# Patient Record
Sex: Male | Born: 1935 | Race: White | Hispanic: No | Smoking: Never smoker
Health system: Southern US, Community
[De-identification: ages and names within clinical notes are randomized; demographics above are authoritative.]

## PROBLEM LIST (undated history)

## (undated) DIAGNOSIS — Z8619 Personal history of other infectious and parasitic diseases: Secondary | ICD-10-CM

## (undated) DIAGNOSIS — G8929 Other chronic pain: Secondary | ICD-10-CM

## (undated) DIAGNOSIS — M549 Dorsalgia, unspecified: Secondary | ICD-10-CM

## (undated) DIAGNOSIS — R4189 Other symptoms and signs involving cognitive functions and awareness: Secondary | ICD-10-CM

## (undated) DIAGNOSIS — I779 Disorder of arteries and arterioles, unspecified: Secondary | ICD-10-CM

## (undated) DIAGNOSIS — K219 Gastro-esophageal reflux disease without esophagitis: Secondary | ICD-10-CM

## (undated) DIAGNOSIS — J302 Other seasonal allergic rhinitis: Secondary | ICD-10-CM

## (undated) DIAGNOSIS — I739 Peripheral vascular disease, unspecified: Secondary | ICD-10-CM

## (undated) DIAGNOSIS — I2510 Atherosclerotic heart disease of native coronary artery without angina pectoris: Secondary | ICD-10-CM

## (undated) DIAGNOSIS — M1990 Unspecified osteoarthritis, unspecified site: Secondary | ICD-10-CM

## (undated) DIAGNOSIS — K635 Polyp of colon: Secondary | ICD-10-CM

## (undated) DIAGNOSIS — M109 Gout, unspecified: Secondary | ICD-10-CM

## (undated) DIAGNOSIS — I482 Chronic atrial fibrillation, unspecified: Secondary | ICD-10-CM

## (undated) DIAGNOSIS — A630 Anogenital (venereal) warts: Secondary | ICD-10-CM

## (undated) DIAGNOSIS — I10 Essential (primary) hypertension: Secondary | ICD-10-CM

## (undated) DIAGNOSIS — E785 Hyperlipidemia, unspecified: Secondary | ICD-10-CM

## (undated) DIAGNOSIS — D691 Qualitative platelet defects: Secondary | ICD-10-CM

## (undated) DIAGNOSIS — Z9289 Personal history of other medical treatment: Secondary | ICD-10-CM

## (undated) DIAGNOSIS — G4733 Obstructive sleep apnea (adult) (pediatric): Secondary | ICD-10-CM

## (undated) DIAGNOSIS — I5189 Other ill-defined heart diseases: Secondary | ICD-10-CM

## (undated) HISTORY — DX: Chronic atrial fibrillation, unspecified: I48.20

## (undated) HISTORY — PX: ROTATOR CUFF REPAIR: SHX139

## (undated) HISTORY — PX: GALLBLADDER SURGERY: SHX652

## (undated) HISTORY — DX: Dorsalgia, unspecified: M54.9

## (undated) HISTORY — DX: Hyperlipidemia, unspecified: E78.5

## (undated) HISTORY — DX: Essential (primary) hypertension: I10

## (undated) HISTORY — DX: Gout, unspecified: M10.9

## (undated) HISTORY — PX: KNEE SURGERY: SHX244

## (undated) HISTORY — DX: Unspecified osteoarthritis, unspecified site: M19.90

## (undated) HISTORY — DX: Personal history of other infectious and parasitic diseases: Z86.19

## (undated) HISTORY — DX: Anogenital (venereal) warts: A63.0

## (undated) HISTORY — DX: Other chronic pain: G89.29

## (undated) HISTORY — DX: Obstructive sleep apnea (adult) (pediatric): G47.33

## (undated) HISTORY — DX: Other seasonal allergic rhinitis: J30.2

## (undated) HISTORY — DX: Gastro-esophageal reflux disease without esophagitis: K21.9

## (undated) HISTORY — DX: Disorder of arteries and arterioles, unspecified: I77.9

## (undated) HISTORY — DX: Peripheral vascular disease, unspecified: I73.9

## (undated) HISTORY — DX: Qualitative platelet defects: D69.1

## (undated) HISTORY — DX: Other symptoms and signs involving cognitive functions and awareness: R41.89

## (undated) HISTORY — DX: Polyp of colon: K63.5

## (undated) HISTORY — DX: Atherosclerotic heart disease of native coronary artery without angina pectoris: I25.10

## (undated) HISTORY — DX: Personal history of other medical treatment: Z92.89

## (undated) HISTORY — DX: Other ill-defined heart diseases: I51.89

## (undated) HISTORY — PX: GYNECOMASTIA EXCISION: SHX5170

## (undated) HISTORY — PX: CARDIAC CATHETERIZATION: SHX172

---
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
## 1993-08-25 HISTORY — PX: CORONARY ARTERY BYPASS GRAFT: SHX141

## 1998-08-25 DIAGNOSIS — I482 Chronic atrial fibrillation, unspecified: Secondary | ICD-10-CM

## 1998-08-25 HISTORY — DX: Chronic atrial fibrillation, unspecified: I48.20

## 2008-01-24 LAB — HM COLONOSCOPY

## 2011-08-26 LAB — HM COLONOSCOPY

## 2011-08-27 DIAGNOSIS — IMO0002 Reserved for concepts with insufficient information to code with codable children: Secondary | ICD-10-CM | POA: Diagnosis not present

## 2011-08-27 DIAGNOSIS — M999 Biomechanical lesion, unspecified: Secondary | ICD-10-CM | POA: Diagnosis not present

## 2011-08-27 DIAGNOSIS — M546 Pain in thoracic spine: Secondary | ICD-10-CM | POA: Diagnosis not present

## 2011-08-28 DIAGNOSIS — IMO0002 Reserved for concepts with insufficient information to code with codable children: Secondary | ICD-10-CM | POA: Diagnosis not present

## 2011-08-28 DIAGNOSIS — M546 Pain in thoracic spine: Secondary | ICD-10-CM | POA: Diagnosis not present

## 2011-08-28 DIAGNOSIS — M999 Biomechanical lesion, unspecified: Secondary | ICD-10-CM | POA: Diagnosis not present

## 2011-09-01 DIAGNOSIS — IMO0002 Reserved for concepts with insufficient information to code with codable children: Secondary | ICD-10-CM | POA: Diagnosis not present

## 2011-09-01 DIAGNOSIS — M546 Pain in thoracic spine: Secondary | ICD-10-CM | POA: Diagnosis not present

## 2011-09-01 DIAGNOSIS — M999 Biomechanical lesion, unspecified: Secondary | ICD-10-CM | POA: Diagnosis not present

## 2011-09-02 DIAGNOSIS — IMO0002 Reserved for concepts with insufficient information to code with codable children: Secondary | ICD-10-CM | POA: Diagnosis not present

## 2011-09-02 DIAGNOSIS — G4733 Obstructive sleep apnea (adult) (pediatric): Secondary | ICD-10-CM | POA: Diagnosis not present

## 2011-09-02 DIAGNOSIS — M546 Pain in thoracic spine: Secondary | ICD-10-CM | POA: Diagnosis not present

## 2011-09-02 DIAGNOSIS — M999 Biomechanical lesion, unspecified: Secondary | ICD-10-CM | POA: Diagnosis not present

## 2011-09-03 DIAGNOSIS — IMO0002 Reserved for concepts with insufficient information to code with codable children: Secondary | ICD-10-CM | POA: Diagnosis not present

## 2011-09-03 DIAGNOSIS — M999 Biomechanical lesion, unspecified: Secondary | ICD-10-CM | POA: Diagnosis not present

## 2011-09-03 DIAGNOSIS — M546 Pain in thoracic spine: Secondary | ICD-10-CM | POA: Diagnosis not present

## 2011-09-04 DIAGNOSIS — M999 Biomechanical lesion, unspecified: Secondary | ICD-10-CM | POA: Diagnosis not present

## 2011-09-04 DIAGNOSIS — M546 Pain in thoracic spine: Secondary | ICD-10-CM | POA: Diagnosis not present

## 2011-09-04 DIAGNOSIS — IMO0002 Reserved for concepts with insufficient information to code with codable children: Secondary | ICD-10-CM | POA: Diagnosis not present

## 2011-09-08 DIAGNOSIS — M546 Pain in thoracic spine: Secondary | ICD-10-CM | POA: Diagnosis not present

## 2011-09-08 DIAGNOSIS — IMO0002 Reserved for concepts with insufficient information to code with codable children: Secondary | ICD-10-CM | POA: Diagnosis not present

## 2011-09-08 DIAGNOSIS — M999 Biomechanical lesion, unspecified: Secondary | ICD-10-CM | POA: Diagnosis not present

## 2011-09-09 DIAGNOSIS — M999 Biomechanical lesion, unspecified: Secondary | ICD-10-CM | POA: Diagnosis not present

## 2011-09-09 DIAGNOSIS — IMO0002 Reserved for concepts with insufficient information to code with codable children: Secondary | ICD-10-CM | POA: Diagnosis not present

## 2011-09-09 DIAGNOSIS — M546 Pain in thoracic spine: Secondary | ICD-10-CM | POA: Diagnosis not present

## 2011-09-10 DIAGNOSIS — IMO0002 Reserved for concepts with insufficient information to code with codable children: Secondary | ICD-10-CM | POA: Diagnosis not present

## 2011-09-10 DIAGNOSIS — M999 Biomechanical lesion, unspecified: Secondary | ICD-10-CM | POA: Diagnosis not present

## 2011-09-10 DIAGNOSIS — M546 Pain in thoracic spine: Secondary | ICD-10-CM | POA: Diagnosis not present

## 2011-09-15 DIAGNOSIS — M999 Biomechanical lesion, unspecified: Secondary | ICD-10-CM | POA: Diagnosis not present

## 2011-09-15 DIAGNOSIS — M546 Pain in thoracic spine: Secondary | ICD-10-CM | POA: Diagnosis not present

## 2011-09-15 DIAGNOSIS — IMO0002 Reserved for concepts with insufficient information to code with codable children: Secondary | ICD-10-CM | POA: Diagnosis not present

## 2011-09-16 DIAGNOSIS — IMO0002 Reserved for concepts with insufficient information to code with codable children: Secondary | ICD-10-CM | POA: Diagnosis not present

## 2011-09-16 DIAGNOSIS — M109 Gout, unspecified: Secondary | ICD-10-CM | POA: Diagnosis not present

## 2011-09-16 DIAGNOSIS — E291 Testicular hypofunction: Secondary | ICD-10-CM | POA: Diagnosis not present

## 2011-09-16 DIAGNOSIS — D696 Thrombocytopenia, unspecified: Secondary | ICD-10-CM | POA: Diagnosis not present

## 2011-09-16 DIAGNOSIS — I4891 Unspecified atrial fibrillation: Secondary | ICD-10-CM | POA: Diagnosis not present

## 2011-09-16 DIAGNOSIS — E7800 Pure hypercholesterolemia, unspecified: Secondary | ICD-10-CM | POA: Diagnosis not present

## 2011-09-16 DIAGNOSIS — M999 Biomechanical lesion, unspecified: Secondary | ICD-10-CM | POA: Diagnosis not present

## 2011-09-16 DIAGNOSIS — M546 Pain in thoracic spine: Secondary | ICD-10-CM | POA: Diagnosis not present

## 2011-09-18 DIAGNOSIS — K219 Gastro-esophageal reflux disease without esophagitis: Secondary | ICD-10-CM | POA: Diagnosis not present

## 2011-09-18 DIAGNOSIS — M999 Biomechanical lesion, unspecified: Secondary | ICD-10-CM | POA: Diagnosis not present

## 2011-09-18 DIAGNOSIS — D126 Benign neoplasm of colon, unspecified: Secondary | ICD-10-CM | POA: Diagnosis not present

## 2011-09-18 DIAGNOSIS — IMO0002 Reserved for concepts with insufficient information to code with codable children: Secondary | ICD-10-CM | POA: Diagnosis not present

## 2011-09-18 DIAGNOSIS — K922 Gastrointestinal hemorrhage, unspecified: Secondary | ICD-10-CM | POA: Diagnosis not present

## 2011-09-18 DIAGNOSIS — M546 Pain in thoracic spine: Secondary | ICD-10-CM | POA: Diagnosis not present

## 2011-09-24 DIAGNOSIS — M999 Biomechanical lesion, unspecified: Secondary | ICD-10-CM | POA: Diagnosis not present

## 2011-09-24 DIAGNOSIS — M546 Pain in thoracic spine: Secondary | ICD-10-CM | POA: Diagnosis not present

## 2011-09-24 DIAGNOSIS — IMO0002 Reserved for concepts with insufficient information to code with codable children: Secondary | ICD-10-CM | POA: Diagnosis not present

## 2011-09-25 DIAGNOSIS — M545 Low back pain, unspecified: Secondary | ICD-10-CM | POA: Diagnosis not present

## 2011-09-25 DIAGNOSIS — M25569 Pain in unspecified knee: Secondary | ICD-10-CM | POA: Diagnosis not present

## 2011-09-30 DIAGNOSIS — IMO0002 Reserved for concepts with insufficient information to code with codable children: Secondary | ICD-10-CM | POA: Diagnosis not present

## 2011-09-30 DIAGNOSIS — M546 Pain in thoracic spine: Secondary | ICD-10-CM | POA: Diagnosis not present

## 2011-09-30 DIAGNOSIS — M999 Biomechanical lesion, unspecified: Secondary | ICD-10-CM | POA: Diagnosis not present

## 2011-10-01 DIAGNOSIS — E291 Testicular hypofunction: Secondary | ICD-10-CM | POA: Diagnosis not present

## 2011-10-01 DIAGNOSIS — I4891 Unspecified atrial fibrillation: Secondary | ICD-10-CM | POA: Diagnosis not present

## 2011-11-03 DIAGNOSIS — E291 Testicular hypofunction: Secondary | ICD-10-CM | POA: Diagnosis not present

## 2011-11-21 DIAGNOSIS — A630 Anogenital (venereal) warts: Secondary | ICD-10-CM | POA: Diagnosis not present

## 2011-12-02 DIAGNOSIS — N529 Male erectile dysfunction, unspecified: Secondary | ICD-10-CM | POA: Diagnosis not present

## 2011-12-02 DIAGNOSIS — E7800 Pure hypercholesterolemia, unspecified: Secondary | ICD-10-CM | POA: Diagnosis not present

## 2011-12-02 DIAGNOSIS — I4891 Unspecified atrial fibrillation: Secondary | ICD-10-CM | POA: Diagnosis not present

## 2011-12-02 DIAGNOSIS — E291 Testicular hypofunction: Secondary | ICD-10-CM | POA: Diagnosis not present

## 2011-12-03 DIAGNOSIS — M5137 Other intervertebral disc degeneration, lumbosacral region: Secondary | ICD-10-CM | POA: Diagnosis not present

## 2011-12-03 DIAGNOSIS — IMO0002 Reserved for concepts with insufficient information to code with codable children: Secondary | ICD-10-CM | POA: Diagnosis not present

## 2011-12-03 DIAGNOSIS — M999 Biomechanical lesion, unspecified: Secondary | ICD-10-CM | POA: Diagnosis not present

## 2011-12-03 DIAGNOSIS — M545 Low back pain, unspecified: Secondary | ICD-10-CM | POA: Diagnosis not present

## 2011-12-05 DIAGNOSIS — M545 Low back pain, unspecified: Secondary | ICD-10-CM | POA: Diagnosis not present

## 2011-12-08 DIAGNOSIS — M545 Low back pain, unspecified: Secondary | ICD-10-CM | POA: Diagnosis not present

## 2011-12-10 DIAGNOSIS — M545 Low back pain, unspecified: Secondary | ICD-10-CM | POA: Diagnosis not present

## 2011-12-12 DIAGNOSIS — M545 Low back pain, unspecified: Secondary | ICD-10-CM | POA: Diagnosis not present

## 2011-12-15 DIAGNOSIS — M999 Biomechanical lesion, unspecified: Secondary | ICD-10-CM | POA: Diagnosis not present

## 2011-12-15 DIAGNOSIS — M5137 Other intervertebral disc degeneration, lumbosacral region: Secondary | ICD-10-CM | POA: Diagnosis not present

## 2011-12-15 DIAGNOSIS — M545 Low back pain, unspecified: Secondary | ICD-10-CM | POA: Diagnosis not present

## 2011-12-15 DIAGNOSIS — IMO0002 Reserved for concepts with insufficient information to code with codable children: Secondary | ICD-10-CM | POA: Diagnosis not present

## 2011-12-16 DIAGNOSIS — M545 Low back pain, unspecified: Secondary | ICD-10-CM | POA: Diagnosis not present

## 2011-12-19 DIAGNOSIS — M545 Low back pain, unspecified: Secondary | ICD-10-CM | POA: Diagnosis not present

## 2011-12-23 DIAGNOSIS — M545 Low back pain, unspecified: Secondary | ICD-10-CM | POA: Diagnosis not present

## 2011-12-26 DIAGNOSIS — M545 Low back pain, unspecified: Secondary | ICD-10-CM | POA: Diagnosis not present

## 2011-12-30 DIAGNOSIS — M545 Low back pain, unspecified: Secondary | ICD-10-CM | POA: Diagnosis not present

## 2012-01-01 DIAGNOSIS — E291 Testicular hypofunction: Secondary | ICD-10-CM | POA: Diagnosis not present

## 2012-01-01 DIAGNOSIS — I4891 Unspecified atrial fibrillation: Secondary | ICD-10-CM | POA: Diagnosis not present

## 2012-01-28 DIAGNOSIS — IMO0002 Reserved for concepts with insufficient information to code with codable children: Secondary | ICD-10-CM | POA: Diagnosis not present

## 2012-01-28 DIAGNOSIS — M999 Biomechanical lesion, unspecified: Secondary | ICD-10-CM | POA: Diagnosis not present

## 2012-01-28 DIAGNOSIS — M5137 Other intervertebral disc degeneration, lumbosacral region: Secondary | ICD-10-CM | POA: Diagnosis not present

## 2012-01-28 DIAGNOSIS — M545 Low back pain, unspecified: Secondary | ICD-10-CM | POA: Diagnosis not present

## 2012-02-09 DIAGNOSIS — E291 Testicular hypofunction: Secondary | ICD-10-CM | POA: Diagnosis not present

## 2012-02-09 DIAGNOSIS — I4891 Unspecified atrial fibrillation: Secondary | ICD-10-CM | POA: Diagnosis not present

## 2012-02-11 DIAGNOSIS — K219 Gastro-esophageal reflux disease without esophagitis: Secondary | ICD-10-CM | POA: Diagnosis not present

## 2012-02-11 DIAGNOSIS — I4891 Unspecified atrial fibrillation: Secondary | ICD-10-CM | POA: Diagnosis not present

## 2012-02-11 DIAGNOSIS — I2510 Atherosclerotic heart disease of native coronary artery without angina pectoris: Secondary | ICD-10-CM | POA: Diagnosis not present

## 2012-03-03 DIAGNOSIS — E291 Testicular hypofunction: Secondary | ICD-10-CM | POA: Diagnosis not present

## 2012-03-03 DIAGNOSIS — I4891 Unspecified atrial fibrillation: Secondary | ICD-10-CM | POA: Diagnosis not present

## 2012-03-03 DIAGNOSIS — G4730 Sleep apnea, unspecified: Secondary | ICD-10-CM | POA: Diagnosis not present

## 2012-04-02 DIAGNOSIS — H52229 Regular astigmatism, unspecified eye: Secondary | ICD-10-CM | POA: Diagnosis not present

## 2012-04-02 DIAGNOSIS — H5210 Myopia, unspecified eye: Secondary | ICD-10-CM | POA: Diagnosis not present

## 2012-04-02 DIAGNOSIS — H524 Presbyopia: Secondary | ICD-10-CM | POA: Diagnosis not present

## 2012-04-06 DIAGNOSIS — I4891 Unspecified atrial fibrillation: Secondary | ICD-10-CM | POA: Diagnosis not present

## 2012-04-06 DIAGNOSIS — E291 Testicular hypofunction: Secondary | ICD-10-CM | POA: Diagnosis not present

## 2012-04-09 DIAGNOSIS — G4733 Obstructive sleep apnea (adult) (pediatric): Secondary | ICD-10-CM | POA: Diagnosis not present

## 2012-04-20 DIAGNOSIS — M999 Biomechanical lesion, unspecified: Secondary | ICD-10-CM | POA: Diagnosis not present

## 2012-04-20 DIAGNOSIS — M5137 Other intervertebral disc degeneration, lumbosacral region: Secondary | ICD-10-CM | POA: Diagnosis not present

## 2012-04-20 DIAGNOSIS — IMO0002 Reserved for concepts with insufficient information to code with codable children: Secondary | ICD-10-CM | POA: Diagnosis not present

## 2012-04-20 DIAGNOSIS — M545 Low back pain, unspecified: Secondary | ICD-10-CM | POA: Diagnosis not present

## 2012-05-05 DIAGNOSIS — A630 Anogenital (venereal) warts: Secondary | ICD-10-CM | POA: Diagnosis not present

## 2012-05-05 DIAGNOSIS — N529 Male erectile dysfunction, unspecified: Secondary | ICD-10-CM | POA: Diagnosis not present

## 2012-05-06 DIAGNOSIS — M999 Biomechanical lesion, unspecified: Secondary | ICD-10-CM | POA: Diagnosis not present

## 2012-05-06 DIAGNOSIS — M545 Low back pain, unspecified: Secondary | ICD-10-CM | POA: Diagnosis not present

## 2012-05-06 DIAGNOSIS — M5137 Other intervertebral disc degeneration, lumbosacral region: Secondary | ICD-10-CM | POA: Diagnosis not present

## 2012-05-06 DIAGNOSIS — IMO0002 Reserved for concepts with insufficient information to code with codable children: Secondary | ICD-10-CM | POA: Diagnosis not present

## 2012-05-10 DIAGNOSIS — I4891 Unspecified atrial fibrillation: Secondary | ICD-10-CM | POA: Diagnosis not present

## 2012-05-10 DIAGNOSIS — Z09 Encounter for follow-up examination after completed treatment for conditions other than malignant neoplasm: Secondary | ICD-10-CM | POA: Diagnosis not present

## 2012-05-10 DIAGNOSIS — J309 Allergic rhinitis, unspecified: Secondary | ICD-10-CM | POA: Diagnosis not present

## 2012-05-10 DIAGNOSIS — E291 Testicular hypofunction: Secondary | ICD-10-CM | POA: Diagnosis not present

## 2012-05-31 DIAGNOSIS — E291 Testicular hypofunction: Secondary | ICD-10-CM | POA: Diagnosis not present

## 2012-05-31 DIAGNOSIS — Z23 Encounter for immunization: Secondary | ICD-10-CM | POA: Diagnosis not present

## 2012-05-31 DIAGNOSIS — I4891 Unspecified atrial fibrillation: Secondary | ICD-10-CM | POA: Diagnosis not present

## 2012-06-07 DIAGNOSIS — A630 Anogenital (venereal) warts: Secondary | ICD-10-CM | POA: Diagnosis not present

## 2012-06-14 DIAGNOSIS — I4891 Unspecified atrial fibrillation: Secondary | ICD-10-CM | POA: Diagnosis not present

## 2012-06-14 DIAGNOSIS — E291 Testicular hypofunction: Secondary | ICD-10-CM | POA: Diagnosis not present

## 2012-06-30 DIAGNOSIS — I4891 Unspecified atrial fibrillation: Secondary | ICD-10-CM | POA: Diagnosis not present

## 2012-06-30 DIAGNOSIS — E291 Testicular hypofunction: Secondary | ICD-10-CM | POA: Diagnosis not present

## 2012-07-02 DIAGNOSIS — I4891 Unspecified atrial fibrillation: Secondary | ICD-10-CM | POA: Diagnosis not present

## 2012-07-08 DIAGNOSIS — E291 Testicular hypofunction: Secondary | ICD-10-CM | POA: Diagnosis not present

## 2012-07-08 DIAGNOSIS — I4891 Unspecified atrial fibrillation: Secondary | ICD-10-CM | POA: Diagnosis not present

## 2012-07-19 DIAGNOSIS — M545 Low back pain, unspecified: Secondary | ICD-10-CM | POA: Diagnosis not present

## 2012-08-05 DIAGNOSIS — E291 Testicular hypofunction: Secondary | ICD-10-CM | POA: Diagnosis not present

## 2012-08-05 DIAGNOSIS — D696 Thrombocytopenia, unspecified: Secondary | ICD-10-CM | POA: Diagnosis not present

## 2012-08-05 DIAGNOSIS — Z125 Encounter for screening for malignant neoplasm of prostate: Secondary | ICD-10-CM | POA: Diagnosis not present

## 2012-08-05 DIAGNOSIS — I4891 Unspecified atrial fibrillation: Secondary | ICD-10-CM | POA: Diagnosis not present

## 2012-08-12 DIAGNOSIS — I4891 Unspecified atrial fibrillation: Secondary | ICD-10-CM | POA: Diagnosis not present

## 2012-08-12 DIAGNOSIS — I2510 Atherosclerotic heart disease of native coronary artery without angina pectoris: Secondary | ICD-10-CM | POA: Diagnosis not present

## 2012-08-12 DIAGNOSIS — Z0000 Encounter for general adult medical examination without abnormal findings: Secondary | ICD-10-CM | POA: Diagnosis not present

## 2012-08-12 DIAGNOSIS — E7800 Pure hypercholesterolemia, unspecified: Secondary | ICD-10-CM | POA: Diagnosis not present

## 2012-09-15 DIAGNOSIS — I4891 Unspecified atrial fibrillation: Secondary | ICD-10-CM | POA: Diagnosis not present

## 2012-09-15 DIAGNOSIS — J069 Acute upper respiratory infection, unspecified: Secondary | ICD-10-CM | POA: Diagnosis not present

## 2012-09-15 DIAGNOSIS — K7689 Other specified diseases of liver: Secondary | ICD-10-CM | POA: Diagnosis not present

## 2012-09-15 DIAGNOSIS — M109 Gout, unspecified: Secondary | ICD-10-CM | POA: Diagnosis not present

## 2012-09-15 DIAGNOSIS — I2510 Atherosclerotic heart disease of native coronary artery without angina pectoris: Secondary | ICD-10-CM | POA: Diagnosis not present

## 2012-09-22 DIAGNOSIS — J329 Chronic sinusitis, unspecified: Secondary | ICD-10-CM | POA: Diagnosis not present

## 2012-09-22 DIAGNOSIS — R05 Cough: Secondary | ICD-10-CM | POA: Diagnosis not present

## 2012-09-22 DIAGNOSIS — R059 Cough, unspecified: Secondary | ICD-10-CM | POA: Diagnosis not present

## 2012-09-22 DIAGNOSIS — J018 Other acute sinusitis: Secondary | ICD-10-CM | POA: Diagnosis not present

## 2012-09-30 DIAGNOSIS — I4891 Unspecified atrial fibrillation: Secondary | ICD-10-CM | POA: Diagnosis not present

## 2012-10-01 DIAGNOSIS — I2510 Atherosclerotic heart disease of native coronary artery without angina pectoris: Secondary | ICD-10-CM | POA: Diagnosis not present

## 2012-10-01 DIAGNOSIS — E785 Hyperlipidemia, unspecified: Secondary | ICD-10-CM | POA: Diagnosis not present

## 2012-10-01 DIAGNOSIS — I4891 Unspecified atrial fibrillation: Secondary | ICD-10-CM | POA: Diagnosis not present

## 2012-10-01 DIAGNOSIS — I10 Essential (primary) hypertension: Secondary | ICD-10-CM | POA: Diagnosis not present

## 2012-10-21 DIAGNOSIS — I4891 Unspecified atrial fibrillation: Secondary | ICD-10-CM | POA: Diagnosis not present

## 2012-10-21 DIAGNOSIS — J189 Pneumonia, unspecified organism: Secondary | ICD-10-CM | POA: Diagnosis not present

## 2012-10-28 DIAGNOSIS — I4891 Unspecified atrial fibrillation: Secondary | ICD-10-CM | POA: Diagnosis not present

## 2012-11-09 DIAGNOSIS — R7982 Elevated C-reactive protein (CRP): Secondary | ICD-10-CM | POA: Diagnosis not present

## 2012-11-09 DIAGNOSIS — I4891 Unspecified atrial fibrillation: Secondary | ICD-10-CM | POA: Diagnosis not present

## 2012-11-09 DIAGNOSIS — D696 Thrombocytopenia, unspecified: Secondary | ICD-10-CM | POA: Diagnosis not present

## 2012-11-09 DIAGNOSIS — M109 Gout, unspecified: Secondary | ICD-10-CM | POA: Diagnosis not present

## 2012-11-11 DIAGNOSIS — M109 Gout, unspecified: Secondary | ICD-10-CM | POA: Diagnosis not present

## 2012-11-11 DIAGNOSIS — I2510 Atherosclerotic heart disease of native coronary artery without angina pectoris: Secondary | ICD-10-CM | POA: Diagnosis not present

## 2012-11-11 DIAGNOSIS — E291 Testicular hypofunction: Secondary | ICD-10-CM | POA: Diagnosis not present

## 2012-11-11 DIAGNOSIS — D696 Thrombocytopenia, unspecified: Secondary | ICD-10-CM | POA: Diagnosis not present

## 2012-11-11 DIAGNOSIS — I4891 Unspecified atrial fibrillation: Secondary | ICD-10-CM | POA: Diagnosis not present

## 2012-11-23 DIAGNOSIS — IMO0002 Reserved for concepts with insufficient information to code with codable children: Secondary | ICD-10-CM | POA: Diagnosis not present

## 2012-11-23 DIAGNOSIS — M545 Low back pain, unspecified: Secondary | ICD-10-CM | POA: Diagnosis not present

## 2012-12-01 DIAGNOSIS — I4891 Unspecified atrial fibrillation: Secondary | ICD-10-CM | POA: Diagnosis not present

## 2012-12-01 DIAGNOSIS — K589 Irritable bowel syndrome without diarrhea: Secondary | ICD-10-CM | POA: Diagnosis not present

## 2012-12-01 DIAGNOSIS — I2510 Atherosclerotic heart disease of native coronary artery without angina pectoris: Secondary | ICD-10-CM | POA: Diagnosis not present

## 2012-12-01 DIAGNOSIS — IMO0002 Reserved for concepts with insufficient information to code with codable children: Secondary | ICD-10-CM | POA: Diagnosis not present

## 2012-12-15 DIAGNOSIS — I4891 Unspecified atrial fibrillation: Secondary | ICD-10-CM | POA: Diagnosis not present

## 2012-12-22 DIAGNOSIS — IMO0002 Reserved for concepts with insufficient information to code with codable children: Secondary | ICD-10-CM | POA: Diagnosis not present

## 2012-12-22 DIAGNOSIS — M5380 Other specified dorsopathies, site unspecified: Secondary | ICD-10-CM | POA: Diagnosis not present

## 2012-12-22 DIAGNOSIS — M545 Low back pain, unspecified: Secondary | ICD-10-CM | POA: Diagnosis not present

## 2012-12-22 DIAGNOSIS — M47817 Spondylosis without myelopathy or radiculopathy, lumbosacral region: Secondary | ICD-10-CM | POA: Diagnosis not present

## 2012-12-28 DIAGNOSIS — I4891 Unspecified atrial fibrillation: Secondary | ICD-10-CM | POA: Diagnosis not present

## 2012-12-31 DIAGNOSIS — I4891 Unspecified atrial fibrillation: Secondary | ICD-10-CM | POA: Diagnosis not present

## 2013-01-05 DIAGNOSIS — I4891 Unspecified atrial fibrillation: Secondary | ICD-10-CM | POA: Diagnosis not present

## 2013-01-20 DIAGNOSIS — M545 Low back pain, unspecified: Secondary | ICD-10-CM | POA: Diagnosis not present

## 2013-01-20 DIAGNOSIS — M47817 Spondylosis without myelopathy or radiculopathy, lumbosacral region: Secondary | ICD-10-CM | POA: Diagnosis not present

## 2013-01-20 DIAGNOSIS — M48061 Spinal stenosis, lumbar region without neurogenic claudication: Secondary | ICD-10-CM | POA: Diagnosis not present

## 2013-01-27 DIAGNOSIS — I4891 Unspecified atrial fibrillation: Secondary | ICD-10-CM | POA: Diagnosis not present

## 2013-02-04 DIAGNOSIS — Z7901 Long term (current) use of anticoagulants: Secondary | ICD-10-CM | POA: Diagnosis not present

## 2013-02-04 DIAGNOSIS — I4891 Unspecified atrial fibrillation: Secondary | ICD-10-CM | POA: Diagnosis not present

## 2013-02-07 DIAGNOSIS — D698 Other specified hemorrhagic conditions: Secondary | ICD-10-CM | POA: Diagnosis not present

## 2013-02-08 DIAGNOSIS — M545 Low back pain, unspecified: Secondary | ICD-10-CM | POA: Diagnosis not present

## 2013-02-08 DIAGNOSIS — M999 Biomechanical lesion, unspecified: Secondary | ICD-10-CM | POA: Diagnosis not present

## 2013-02-08 DIAGNOSIS — IMO0001 Reserved for inherently not codable concepts without codable children: Secondary | ICD-10-CM | POA: Diagnosis not present

## 2013-02-08 DIAGNOSIS — M546 Pain in thoracic spine: Secondary | ICD-10-CM | POA: Diagnosis not present

## 2013-02-08 DIAGNOSIS — M62838 Other muscle spasm: Secondary | ICD-10-CM | POA: Diagnosis not present

## 2013-02-10 DIAGNOSIS — M62838 Other muscle spasm: Secondary | ICD-10-CM | POA: Diagnosis not present

## 2013-02-10 DIAGNOSIS — M546 Pain in thoracic spine: Secondary | ICD-10-CM | POA: Diagnosis not present

## 2013-02-10 DIAGNOSIS — IMO0001 Reserved for inherently not codable concepts without codable children: Secondary | ICD-10-CM | POA: Diagnosis not present

## 2013-02-10 DIAGNOSIS — M999 Biomechanical lesion, unspecified: Secondary | ICD-10-CM | POA: Diagnosis not present

## 2013-02-11 DIAGNOSIS — M545 Low back pain, unspecified: Secondary | ICD-10-CM | POA: Diagnosis not present

## 2013-02-16 DIAGNOSIS — M545 Low back pain, unspecified: Secondary | ICD-10-CM | POA: Diagnosis not present

## 2013-02-18 DIAGNOSIS — M545 Low back pain, unspecified: Secondary | ICD-10-CM | POA: Diagnosis not present

## 2013-02-21 DIAGNOSIS — M545 Low back pain, unspecified: Secondary | ICD-10-CM | POA: Diagnosis not present

## 2013-02-22 DIAGNOSIS — I4891 Unspecified atrial fibrillation: Secondary | ICD-10-CM | POA: Diagnosis not present

## 2013-02-22 DIAGNOSIS — R4589 Other symptoms and signs involving emotional state: Secondary | ICD-10-CM | POA: Diagnosis not present

## 2013-02-22 DIAGNOSIS — IMO0002 Reserved for concepts with insufficient information to code with codable children: Secondary | ICD-10-CM | POA: Diagnosis not present

## 2013-02-24 DIAGNOSIS — M545 Low back pain, unspecified: Secondary | ICD-10-CM | POA: Diagnosis not present

## 2013-02-28 DIAGNOSIS — M545 Low back pain, unspecified: Secondary | ICD-10-CM | POA: Diagnosis not present

## 2013-03-04 DIAGNOSIS — M545 Low back pain, unspecified: Secondary | ICD-10-CM | POA: Diagnosis not present

## 2013-03-08 DIAGNOSIS — M545 Low back pain, unspecified: Secondary | ICD-10-CM | POA: Diagnosis not present

## 2013-03-09 DIAGNOSIS — M545 Low back pain, unspecified: Secondary | ICD-10-CM | POA: Diagnosis not present

## 2013-03-15 DIAGNOSIS — M545 Low back pain, unspecified: Secondary | ICD-10-CM | POA: Diagnosis not present

## 2013-03-18 DIAGNOSIS — M545 Low back pain, unspecified: Secondary | ICD-10-CM | POA: Diagnosis not present

## 2013-03-25 DIAGNOSIS — M545 Low back pain, unspecified: Secondary | ICD-10-CM | POA: Diagnosis not present

## 2013-04-26 DIAGNOSIS — I4891 Unspecified atrial fibrillation: Secondary | ICD-10-CM | POA: Diagnosis not present

## 2013-04-26 DIAGNOSIS — I2510 Atherosclerotic heart disease of native coronary artery without angina pectoris: Secondary | ICD-10-CM | POA: Diagnosis not present

## 2013-04-26 DIAGNOSIS — IMO0002 Reserved for concepts with insufficient information to code with codable children: Secondary | ICD-10-CM | POA: Diagnosis not present

## 2013-04-26 DIAGNOSIS — K589 Irritable bowel syndrome without diarrhea: Secondary | ICD-10-CM | POA: Diagnosis not present

## 2013-05-05 DIAGNOSIS — I4891 Unspecified atrial fibrillation: Secondary | ICD-10-CM | POA: Diagnosis not present

## 2013-06-08 DIAGNOSIS — I4891 Unspecified atrial fibrillation: Secondary | ICD-10-CM | POA: Diagnosis not present

## 2013-06-20 DIAGNOSIS — I4891 Unspecified atrial fibrillation: Secondary | ICD-10-CM | POA: Diagnosis not present

## 2013-06-20 DIAGNOSIS — Z23 Encounter for immunization: Secondary | ICD-10-CM | POA: Diagnosis not present

## 2013-07-05 DIAGNOSIS — I4891 Unspecified atrial fibrillation: Secondary | ICD-10-CM | POA: Diagnosis not present

## 2013-07-20 DIAGNOSIS — H10509 Unspecified blepharoconjunctivitis, unspecified eye: Secondary | ICD-10-CM | POA: Diagnosis not present

## 2013-07-20 DIAGNOSIS — I4891 Unspecified atrial fibrillation: Secondary | ICD-10-CM | POA: Diagnosis not present

## 2013-07-20 DIAGNOSIS — I2510 Atherosclerotic heart disease of native coronary artery without angina pectoris: Secondary | ICD-10-CM | POA: Diagnosis not present

## 2013-07-20 DIAGNOSIS — H109 Unspecified conjunctivitis: Secondary | ICD-10-CM | POA: Diagnosis not present

## 2013-08-02 DIAGNOSIS — H02139 Senile ectropion of unspecified eye, unspecified eyelid: Secondary | ICD-10-CM | POA: Diagnosis not present

## 2013-08-02 DIAGNOSIS — H10509 Unspecified blepharoconjunctivitis, unspecified eye: Secondary | ICD-10-CM | POA: Diagnosis not present

## 2013-08-03 DIAGNOSIS — I4891 Unspecified atrial fibrillation: Secondary | ICD-10-CM | POA: Diagnosis not present

## 2013-08-03 DIAGNOSIS — K219 Gastro-esophageal reflux disease without esophagitis: Secondary | ICD-10-CM | POA: Diagnosis not present

## 2013-08-03 DIAGNOSIS — E7800 Pure hypercholesterolemia, unspecified: Secondary | ICD-10-CM | POA: Diagnosis not present

## 2013-08-10 DIAGNOSIS — Z23 Encounter for immunization: Secondary | ICD-10-CM | POA: Diagnosis not present

## 2013-08-10 DIAGNOSIS — Z0000 Encounter for general adult medical examination without abnormal findings: Secondary | ICD-10-CM | POA: Diagnosis not present

## 2013-08-10 DIAGNOSIS — K7689 Other specified diseases of liver: Secondary | ICD-10-CM | POA: Diagnosis not present

## 2013-08-10 DIAGNOSIS — E7800 Pure hypercholesterolemia, unspecified: Secondary | ICD-10-CM | POA: Diagnosis not present

## 2013-08-10 DIAGNOSIS — I4891 Unspecified atrial fibrillation: Secondary | ICD-10-CM | POA: Diagnosis not present

## 2013-08-22 DIAGNOSIS — G4733 Obstructive sleep apnea (adult) (pediatric): Secondary | ICD-10-CM | POA: Diagnosis not present

## 2013-08-23 DIAGNOSIS — H02139 Senile ectropion of unspecified eye, unspecified eyelid: Secondary | ICD-10-CM | POA: Diagnosis not present

## 2013-08-23 DIAGNOSIS — H01009 Unspecified blepharitis unspecified eye, unspecified eyelid: Secondary | ICD-10-CM | POA: Diagnosis not present

## 2013-08-29 DIAGNOSIS — M999 Biomechanical lesion, unspecified: Secondary | ICD-10-CM | POA: Diagnosis not present

## 2013-08-29 DIAGNOSIS — IMO0001 Reserved for inherently not codable concepts without codable children: Secondary | ICD-10-CM | POA: Diagnosis not present

## 2013-08-29 DIAGNOSIS — M545 Low back pain, unspecified: Secondary | ICD-10-CM | POA: Diagnosis not present

## 2013-08-29 DIAGNOSIS — M62838 Other muscle spasm: Secondary | ICD-10-CM | POA: Diagnosis not present

## 2013-08-30 DIAGNOSIS — M545 Low back pain, unspecified: Secondary | ICD-10-CM | POA: Diagnosis not present

## 2013-08-30 DIAGNOSIS — M62838 Other muscle spasm: Secondary | ICD-10-CM | POA: Diagnosis not present

## 2013-08-30 DIAGNOSIS — IMO0001 Reserved for inherently not codable concepts without codable children: Secondary | ICD-10-CM | POA: Diagnosis not present

## 2013-08-30 DIAGNOSIS — M999 Biomechanical lesion, unspecified: Secondary | ICD-10-CM | POA: Diagnosis not present

## 2013-08-31 DIAGNOSIS — M545 Low back pain, unspecified: Secondary | ICD-10-CM | POA: Diagnosis not present

## 2013-08-31 DIAGNOSIS — I4891 Unspecified atrial fibrillation: Secondary | ICD-10-CM | POA: Diagnosis not present

## 2013-08-31 DIAGNOSIS — M62838 Other muscle spasm: Secondary | ICD-10-CM | POA: Diagnosis not present

## 2013-08-31 DIAGNOSIS — IMO0001 Reserved for inherently not codable concepts without codable children: Secondary | ICD-10-CM | POA: Diagnosis not present

## 2013-08-31 DIAGNOSIS — M999 Biomechanical lesion, unspecified: Secondary | ICD-10-CM | POA: Diagnosis not present

## 2013-09-01 DIAGNOSIS — IMO0001 Reserved for inherently not codable concepts without codable children: Secondary | ICD-10-CM | POA: Diagnosis not present

## 2013-09-01 DIAGNOSIS — M999 Biomechanical lesion, unspecified: Secondary | ICD-10-CM | POA: Diagnosis not present

## 2013-09-01 DIAGNOSIS — M545 Low back pain, unspecified: Secondary | ICD-10-CM | POA: Diagnosis not present

## 2013-09-01 DIAGNOSIS — M62838 Other muscle spasm: Secondary | ICD-10-CM | POA: Diagnosis not present

## 2013-09-01 DIAGNOSIS — L608 Other nail disorders: Secondary | ICD-10-CM | POA: Diagnosis not present

## 2013-09-13 DIAGNOSIS — M999 Biomechanical lesion, unspecified: Secondary | ICD-10-CM | POA: Diagnosis not present

## 2013-09-13 DIAGNOSIS — IMO0001 Reserved for inherently not codable concepts without codable children: Secondary | ICD-10-CM | POA: Diagnosis not present

## 2013-09-13 DIAGNOSIS — M545 Low back pain, unspecified: Secondary | ICD-10-CM | POA: Diagnosis not present

## 2013-09-13 DIAGNOSIS — M62838 Other muscle spasm: Secondary | ICD-10-CM | POA: Diagnosis not present

## 2013-09-14 DIAGNOSIS — M545 Low back pain, unspecified: Secondary | ICD-10-CM | POA: Diagnosis not present

## 2013-09-14 DIAGNOSIS — M999 Biomechanical lesion, unspecified: Secondary | ICD-10-CM | POA: Diagnosis not present

## 2013-09-14 DIAGNOSIS — IMO0001 Reserved for inherently not codable concepts without codable children: Secondary | ICD-10-CM | POA: Diagnosis not present

## 2013-09-14 DIAGNOSIS — M62838 Other muscle spasm: Secondary | ICD-10-CM | POA: Diagnosis not present

## 2013-09-15 DIAGNOSIS — M545 Low back pain, unspecified: Secondary | ICD-10-CM | POA: Diagnosis not present

## 2013-09-15 DIAGNOSIS — M999 Biomechanical lesion, unspecified: Secondary | ICD-10-CM | POA: Diagnosis not present

## 2013-09-15 DIAGNOSIS — M62838 Other muscle spasm: Secondary | ICD-10-CM | POA: Diagnosis not present

## 2013-09-15 DIAGNOSIS — IMO0001 Reserved for inherently not codable concepts without codable children: Secondary | ICD-10-CM | POA: Diagnosis not present

## 2013-09-19 DIAGNOSIS — M545 Low back pain, unspecified: Secondary | ICD-10-CM | POA: Diagnosis not present

## 2013-09-19 DIAGNOSIS — IMO0001 Reserved for inherently not codable concepts without codable children: Secondary | ICD-10-CM | POA: Diagnosis not present

## 2013-09-19 DIAGNOSIS — M62838 Other muscle spasm: Secondary | ICD-10-CM | POA: Diagnosis not present

## 2013-09-19 DIAGNOSIS — M999 Biomechanical lesion, unspecified: Secondary | ICD-10-CM | POA: Diagnosis not present

## 2013-09-20 DIAGNOSIS — M545 Low back pain, unspecified: Secondary | ICD-10-CM | POA: Diagnosis not present

## 2013-09-20 DIAGNOSIS — M999 Biomechanical lesion, unspecified: Secondary | ICD-10-CM | POA: Diagnosis not present

## 2013-09-20 DIAGNOSIS — IMO0001 Reserved for inherently not codable concepts without codable children: Secondary | ICD-10-CM | POA: Diagnosis not present

## 2013-09-20 DIAGNOSIS — M62838 Other muscle spasm: Secondary | ICD-10-CM | POA: Diagnosis not present

## 2013-09-21 DIAGNOSIS — M62838 Other muscle spasm: Secondary | ICD-10-CM | POA: Diagnosis not present

## 2013-09-21 DIAGNOSIS — M545 Low back pain, unspecified: Secondary | ICD-10-CM | POA: Diagnosis not present

## 2013-09-21 DIAGNOSIS — IMO0001 Reserved for inherently not codable concepts without codable children: Secondary | ICD-10-CM | POA: Diagnosis not present

## 2013-09-21 DIAGNOSIS — M999 Biomechanical lesion, unspecified: Secondary | ICD-10-CM | POA: Diagnosis not present

## 2013-10-04 DIAGNOSIS — H02139 Senile ectropion of unspecified eye, unspecified eyelid: Secondary | ICD-10-CM | POA: Diagnosis not present

## 2013-10-04 DIAGNOSIS — H01009 Unspecified blepharitis unspecified eye, unspecified eyelid: Secondary | ICD-10-CM | POA: Diagnosis not present

## 2013-10-05 DIAGNOSIS — I4891 Unspecified atrial fibrillation: Secondary | ICD-10-CM | POA: Diagnosis not present

## 2013-10-12 DIAGNOSIS — M999 Biomechanical lesion, unspecified: Secondary | ICD-10-CM | POA: Diagnosis not present

## 2013-10-12 DIAGNOSIS — M62838 Other muscle spasm: Secondary | ICD-10-CM | POA: Diagnosis not present

## 2013-10-12 DIAGNOSIS — M545 Low back pain, unspecified: Secondary | ICD-10-CM | POA: Diagnosis not present

## 2013-10-12 DIAGNOSIS — IMO0001 Reserved for inherently not codable concepts without codable children: Secondary | ICD-10-CM | POA: Diagnosis not present

## 2013-10-26 DIAGNOSIS — E785 Hyperlipidemia, unspecified: Secondary | ICD-10-CM | POA: Diagnosis not present

## 2013-10-26 DIAGNOSIS — I4891 Unspecified atrial fibrillation: Secondary | ICD-10-CM | POA: Diagnosis not present

## 2013-10-26 DIAGNOSIS — I10 Essential (primary) hypertension: Secondary | ICD-10-CM | POA: Diagnosis not present

## 2013-10-26 DIAGNOSIS — I2510 Atherosclerotic heart disease of native coronary artery without angina pectoris: Secondary | ICD-10-CM | POA: Diagnosis not present

## 2013-11-02 DIAGNOSIS — Z7901 Long term (current) use of anticoagulants: Secondary | ICD-10-CM | POA: Diagnosis not present

## 2013-11-02 DIAGNOSIS — M545 Low back pain, unspecified: Secondary | ICD-10-CM | POA: Diagnosis not present

## 2013-11-02 DIAGNOSIS — IMO0001 Reserved for inherently not codable concepts without codable children: Secondary | ICD-10-CM | POA: Diagnosis not present

## 2013-11-02 DIAGNOSIS — I4891 Unspecified atrial fibrillation: Secondary | ICD-10-CM | POA: Diagnosis not present

## 2013-11-02 DIAGNOSIS — M62838 Other muscle spasm: Secondary | ICD-10-CM | POA: Diagnosis not present

## 2013-11-02 DIAGNOSIS — M999 Biomechanical lesion, unspecified: Secondary | ICD-10-CM | POA: Diagnosis not present

## 2013-11-02 DIAGNOSIS — S61209A Unspecified open wound of unspecified finger without damage to nail, initial encounter: Secondary | ICD-10-CM | POA: Diagnosis not present

## 2013-11-03 DIAGNOSIS — Z23 Encounter for immunization: Secondary | ICD-10-CM | POA: Diagnosis not present

## 2013-11-03 DIAGNOSIS — M62838 Other muscle spasm: Secondary | ICD-10-CM | POA: Diagnosis not present

## 2013-11-03 DIAGNOSIS — M545 Low back pain, unspecified: Secondary | ICD-10-CM | POA: Diagnosis not present

## 2013-11-03 DIAGNOSIS — M999 Biomechanical lesion, unspecified: Secondary | ICD-10-CM | POA: Diagnosis not present

## 2013-11-03 DIAGNOSIS — IMO0001 Reserved for inherently not codable concepts without codable children: Secondary | ICD-10-CM | POA: Diagnosis not present

## 2013-11-07 DIAGNOSIS — M545 Low back pain, unspecified: Secondary | ICD-10-CM | POA: Diagnosis not present

## 2013-11-07 DIAGNOSIS — M999 Biomechanical lesion, unspecified: Secondary | ICD-10-CM | POA: Diagnosis not present

## 2013-11-07 DIAGNOSIS — M62838 Other muscle spasm: Secondary | ICD-10-CM | POA: Diagnosis not present

## 2013-11-07 DIAGNOSIS — IMO0001 Reserved for inherently not codable concepts without codable children: Secondary | ICD-10-CM | POA: Diagnosis not present

## 2013-12-01 DIAGNOSIS — I4891 Unspecified atrial fibrillation: Secondary | ICD-10-CM | POA: Diagnosis not present

## 2013-12-08 ENCOUNTER — Ambulatory Visit (INDEPENDENT_AMBULATORY_CARE_PROVIDER_SITE_OTHER): Payer: Medicare Other | Admitting: Cardiovascular Disease

## 2013-12-08 ENCOUNTER — Encounter: Payer: Self-pay | Admitting: Cardiovascular Disease

## 2013-12-08 VITALS — BP 140/70 | HR 54 | Ht 68.0 in | Wt 202.2 lb

## 2013-12-08 DIAGNOSIS — I4819 Other persistent atrial fibrillation: Secondary | ICD-10-CM

## 2013-12-08 DIAGNOSIS — I25810 Atherosclerosis of coronary artery bypass graft(s) without angina pectoris: Secondary | ICD-10-CM | POA: Diagnosis not present

## 2013-12-08 DIAGNOSIS — I10 Essential (primary) hypertension: Secondary | ICD-10-CM

## 2013-12-08 DIAGNOSIS — I4891 Unspecified atrial fibrillation: Secondary | ICD-10-CM

## 2013-12-08 DIAGNOSIS — I2510 Atherosclerotic heart disease of native coronary artery without angina pectoris: Secondary | ICD-10-CM | POA: Diagnosis not present

## 2013-12-08 DIAGNOSIS — E785 Hyperlipidemia, unspecified: Secondary | ICD-10-CM

## 2013-12-08 NOTE — Patient Instructions (Signed)
Your physician has recommended you make the following change in your medication:  Stop Aspirin   Your physician recommends that you schedule a follow-up appointment in:  3 months    Please schedule a new patient appt in our Coumadin [facility]

## 2013-12-09 ENCOUNTER — Encounter: Payer: Self-pay | Admitting: Cardiovascular Disease

## 2013-12-09 DIAGNOSIS — I10 Essential (primary) hypertension: Secondary | ICD-10-CM | POA: Insufficient documentation

## 2013-12-09 DIAGNOSIS — E785 Hyperlipidemia, unspecified: Secondary | ICD-10-CM | POA: Insufficient documentation

## 2013-12-09 DIAGNOSIS — I25118 Atherosclerotic heart disease of native coronary artery with other forms of angina pectoris: Secondary | ICD-10-CM | POA: Insufficient documentation

## 2013-12-09 DIAGNOSIS — I2510 Atherosclerotic heart disease of native coronary artery without angina pectoris: Secondary | ICD-10-CM | POA: Insufficient documentation

## 2013-12-09 DIAGNOSIS — I4819 Other persistent atrial fibrillation: Secondary | ICD-10-CM | POA: Insufficient documentation

## 2013-12-09 NOTE — Progress Notes (Signed)
HPI  This is a pleasant 80 year old man who is here today to establish cardiovascular care. He moved recently from [place] after divorce. He has prolonged cardiac history. He underwent 6 vessel CABG in 1995 in [place]. He was diagnosed later with atrial fibrillation and was started on anticoagulation with warfarin. He has been maintaining in sinus rhythm with sotalol. He reports no recurrent ischemic events since his CABG. He has known history of hypertension, hyperlipidemia and previous tobacco use. There is family history of coronary artery disease. He is retired from [employer], research (medical). He reports following up with a cardiologist on a regular basis. He has been doing very well and denies any chest pain, dyspnea or palpitations. He exercises regularly without exertional symptoms.  No Known Allergies   No current outpatient prescriptions on file prior to visit.   No current facility-administered medications on file prior to visit.     Past Medical History  Diagnosis Date  . A-fib   . Gout   . Arrhythmia   . Coronary artery disease   . Persistent atrial fibrillation   . Hyperlipidemia   . Hypertension      Past Surgical History  Procedure Laterality Date  . Coronary artery bypass graft  1995    CABG x 6 in [place], [place]  . Knee surgery      bilateral   . Rotator cuff repair      right  . Gallbladder surgery    . Cardiac catheterization       Family History  Problem Relation Age of Onset  . Heart disease Father   . Heart attack Father 52    MI  . Heart attack Paternal Grandmother 75  . Heart attack Paternal Grandfather 38     History   Social History  . Marital Status: Divorced    Spouse Name: N/A    Number of Children: N/A  . Years of Education: N/A   Occupational History  . Not on file.   Social History Main Topics  . Smoking status: Never Smoker   . Smokeless tobacco: Not on file  . Alcohol Use: Yes     Comment: occasional   . Drug Use: No  . Sexual Activity:  Not on file   Other Topics Concern  . Not on file   Social History Narrative  . No narrative on file     ROS A 10 point review of system was performed. It is negative other than that mentioned in the history of present illness.   PHYSICAL EXAM   BP 140/70  Pulse 54  Ht 5\' 8"  (1.727 m)  Wt 202 lb 4 oz (91.74 kg)  BMI 30.76 kg/m2 Constitutional: He is oriented to person, place, and time. He appears well-developed and well-nourished. No distress.  HENT: No nasal discharge.  Head: Normocephalic and atraumatic.  Eyes: Pupils are equal and round.  No discharge. Neck: Normal range of motion. Neck supple. No JVD present. No thyromegaly present.  Cardiovascular: Normal rate, regular rhythm, normal heart sounds. Exam reveals no gallop and no friction rub. No murmur heard.  Pulmonary/Chest: Effort normal and breath sounds normal. No stridor. No respiratory distress. He has no wheezes. He has no rales. He exhibits no tenderness.  Abdominal: Soft. Bowel sounds are normal. He exhibits no distension. There is no tenderness. There is no rebound and no guarding.  Musculoskeletal: Normal range of motion. He exhibits no edema and no tenderness.  Neurological: He is alert and oriented to person, place, and  time. Coordination normal.  Skin: Skin is warm and dry. No rash noted. He is not diaphoretic. No erythema. No pallor.  Psychiatric: He has a normal mood and affect. His behavior is normal. Judgment and thought content normal.       FUX:NATFT  Bradycardia  WITHIN NORMAL LIMITS   ASSESSMENT AND PLAN

## 2013-12-09 NOTE — Assessment & Plan Note (Signed)
Blood pressure is reasonably controlled on current medications. 

## 2013-12-09 NOTE — Assessment & Plan Note (Signed)
He is doing well with no symptoms suggestive of angina. Continue medical therapy. We are requesting his previous cardiac records.

## 2013-12-09 NOTE — Assessment & Plan Note (Addendum)
He is maintaining in sinus rhythm on sotalol. EKG is normal with no QT prolongation. He is on long-term anticoagulation with warfarin. I am going to enroll him in our anticoagulation clinic. He is interested in home monitoring which was done in the past with his previous physician. Given that he is on warfarin with no recent ischemic events, I stop aspirin today.

## 2013-12-09 NOTE — Assessment & Plan Note (Signed)
He is on high-dose simvastatin. Fasting lipid and liver profile will be needed.  He is going to establish with [place] primary care as well.

## 2013-12-12 ENCOUNTER — Other Ambulatory Visit: Payer: Self-pay

## 2013-12-13 ENCOUNTER — Ambulatory Visit (INDEPENDENT_AMBULATORY_CARE_PROVIDER_SITE_OTHER): Payer: Medicare Other | Admitting: Adult Health

## 2013-12-13 ENCOUNTER — Other Ambulatory Visit: Payer: Self-pay | Admitting: *Deleted

## 2013-12-13 ENCOUNTER — Encounter: Payer: Self-pay | Admitting: Adult Health

## 2013-12-13 VITALS — BP 138/76 | HR 65 | Temp 98.0°F | Resp 16 | Ht 67.5 in | Wt 202.0 lb

## 2013-12-13 DIAGNOSIS — Z8639 Personal history of other endocrine, nutritional and metabolic disease: Secondary | ICD-10-CM

## 2013-12-13 DIAGNOSIS — M129 Arthropathy, unspecified: Secondary | ICD-10-CM | POA: Diagnosis not present

## 2013-12-13 DIAGNOSIS — M1990 Unspecified osteoarthritis, unspecified site: Secondary | ICD-10-CM

## 2013-12-13 DIAGNOSIS — J309 Allergic rhinitis, unspecified: Secondary | ICD-10-CM | POA: Diagnosis not present

## 2013-12-13 DIAGNOSIS — Z862 Personal history of diseases of the blood and blood-forming organs and certain disorders involving the immune mechanism: Secondary | ICD-10-CM

## 2013-12-13 DIAGNOSIS — Z8739 Personal history of other diseases of the musculoskeletal system and connective tissue: Secondary | ICD-10-CM

## 2013-12-13 DIAGNOSIS — J302 Other seasonal allergic rhinitis: Secondary | ICD-10-CM

## 2013-12-13 DIAGNOSIS — I2510 Atherosclerotic heart disease of native coronary artery without angina pectoris: Secondary | ICD-10-CM

## 2013-12-13 MED ORDER — SIMVASTATIN 80 MG PO TABS: 80.0000 mg | ORAL_TABLET | Freq: Every day | ORAL | Status: DC

## 2013-12-13 MED ORDER — SOTALOL HCL 80 MG PO TABS: 80.0000 mg | ORAL_TABLET | Freq: Two times a day (BID) | ORAL | Status: DC

## 2013-12-13 MED ORDER — METOPROLOL TARTRATE 25 MG PO TABS: ORAL_TABLET | ORAL | Status: DC

## 2013-12-13 NOTE — Telephone Encounter (Signed)
Patient is requesting Rx: Warfarin 5 mg, please review for refill.

## 2013-12-13 NOTE — Progress Notes (Signed)
Pre visit review using our clinic review tool, if applicable. No additional management support is needed unless otherwise documented below in the visit note. 

## 2013-12-13 NOTE — Telephone Encounter (Signed)
Requested Prescriptions   Signed Prescriptions Disp Refills  . metoprolol tartrate (LOPRESSOR) 25 MG tablet 45 tablet 3    Sig: Take 12.5 mg in the am & 25 mg in the pm.    Authorizing Provider: Kathlyn Sacramento A    Ordering User: Eugenio Hoes, Jamani Eley C  . simvastatin (ZOCOR) 80 MG tablet 30 tablet 3    Sig: Take 1 tablet (80 mg total) by mouth daily.    Authorizing Provider: Kathlyn Sacramento A    Ordering User: Britt Bottom sotalol (BETAPACE) 80 MG tablet 60 tablet 3    Sig: Take 1 tablet (80 mg total) by mouth 2 (two) times daily.    Authorizing Provider: Kathlyn Sacramento A    Ordering User: Britt Bottom

## 2013-12-13 NOTE — Telephone Encounter (Signed)
All other requested cardiac meds have been sent in to local [facility].

## 2013-12-13 NOTE — Progress Notes (Signed)
Patient ID: Jeffery Reynolds, male   DOB: Nov 08, 1933, 80 y.o.   MRN: [id number]    Subjective:    Patient ID: Jeffery Reynolds, male    DOB: 1934-06-09, 80 y.o.   MRN: [id number]  HPI  Pt is a pleasant 80 y/o male with past medical hx as listed below who presents to clinic to establish care. He recently moved to the [place] area from [place] following a divorce. He is currently living with one of his sons until he is able to find a home for himself. He has recently also established care with Dr. Fletcher Anon for his cardiac hx. He is currently feeling well. Reports history of gout but well controlled on Allopurinol. He has tried to stop taking this medication in the past but his uric acid levels rose rather quickly and he developed gout. Also hx of arthritis but tries to stay active. Goes to the gym regularly. Seasonal allergies are controlled with flonase. He denies any rhinorrhea, watery eyes or post nasal drip.    Past Medical History  Diagnosis Date  . A-fib   . Gout     Controlled with Allopurinol  . Hyperlipidemia   . Hypertension   . Arthritis   . History of chicken pox   . GERD (gastroesophageal reflux disease)   . Genital warts   . Seasonal allergies   . Colon polyps   . History of blood transfusion      Past Surgical History  Procedure Laterality Date  . Coronary artery bypass graft  1995    CABG x 6 in [place], [place]  . Knee surgery      bilateral   . Rotator cuff repair      right  . Gallbladder surgery    . Cardiac catheterization    . Tonsillectomy and adenoidectomy  1943  . Gynecomastia excision       Family History  Problem Relation Age of Onset  . Heart disease Father   . Heart attack Father 27    MI  . Alcohol abuse Father   . Hyperlipidemia Father   . Hypertension Father   . Heart attack Paternal Grandmother 54  . Heart attack Paternal Grandfather 69  . Cancer Mother     Breast cancer and Lung Cancer  . Diabetes Brother   . Crohn's disease Daughter   .  Cancer Brother     lung cancer  . Alcohol abuse Son      History   Social History  . Marital Status: Divorced    Spouse Name: N/A    Number of Children: 7  . Years of Education: 12   Occupational History  . 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 Smoking status: Never Smoker   . Smokeless tobacco: Not on file  . Alcohol Use: Yes     Comment: occasional   . Drug Use: No  . Sexual Activity: Not on file   Other Topics Concern  . Not on file   Social History Narrative   Mr. Ganus grew up in [place], [place]. Most recently he was living in [place]. He moved to the [place] area since March 2015. Recently divorced and moved to this area to be close to one of his sons. He enjoys reading and has been going to the gym to exercise.              Review of Systems  Constitutional: Negative.  Negative for activity change and appetite change.  HENT: Negative.   Eyes: Negative.   Respiratory: Negative.  Negative for cough, chest tightness, shortness of breath and wheezing.   Cardiovascular: Negative.  Negative for chest pain and leg swelling.  Gastrointestinal: Negative.   Endocrine: Negative.   Genitourinary: Negative.  Negative for hematuria and difficulty urinating.  Musculoskeletal: Negative.   Skin: Negative.   Allergic/Immunologic: Negative.   Neurological: Negative.  Negative for dizziness, syncope, weakness and light-headedness.  Hematological: Negative.   Psychiatric/Behavioral: Negative.        Objective:  BP 138/76  Pulse 65  Temp(Src) 98 F (36.7 C) (Oral)  Resp 16  Ht 5' 7.5" (1.715 m)  Wt 202 lb (91.627 kg)  BMI 31.15 kg/m2  SpO2 97%   Physical Exam  Constitutional: He is oriented to person, place, and time. He appears well-developed and well-nourished. No distress.  HENT:  Head: Normocephalic and atraumatic.  Eyes: Conjunctivae and EOM are normal.  Neck: Neck supple.  Cardiovascular: Normal rate,  regular rhythm, normal heart sounds and intact distal pulses.  Exam reveals no gallop and no friction rub.   No murmur heard. Pulmonary/Chest: Effort normal. No respiratory distress. He has no wheezes. He has no rales.  Musculoskeletal: Normal range of motion. He exhibits no edema.  Neurological: He is alert and oriented to person, place, and time. Coordination normal.  Skin: Skin is warm and dry.  Psychiatric: He has a normal mood and affect. His behavior is normal. Judgment and thought content normal.      Assessment & Plan:   1. Seasonal allergies Patient is well controlled on Flonase. So far, has not required any oral antihistamines. Stable. Continue to follow.  2. Arthritis Patient stays active. Goes to the gym 3-4 times a week. He takes pain medication as necessary.  As needed, we'll refer to orthopedic for further evaluation and recommendations.  3. H/O: gout Patient reports multiple episodes of gout. He avoids red meats. Takes allopurinol regularly. Has not had any problems as long as he stays on this medication. Continue to follow.

## 2013-12-13 NOTE — Patient Instructions (Signed)
   Thank you for choosing [facility] for your health care needs.  I will request your medical records from Dr. Rosana Hoes.  Please schedule your Medicare Wellness exam with me in November. You will need to be fasting for labs. Nothing to eat or drink except for water after midnight the night prior.  When you need refills on any of your medication please have [facility] send me a message.  Remember to activate your MyChart Account so that you can access you medical records through your email.  Call with any questions or concerns.

## 2013-12-14 ENCOUNTER — Ambulatory Visit (INDEPENDENT_AMBULATORY_CARE_PROVIDER_SITE_OTHER): Payer: Medicare Other

## 2013-12-14 DIAGNOSIS — I4891 Unspecified atrial fibrillation: Secondary | ICD-10-CM | POA: Diagnosis not present

## 2013-12-14 DIAGNOSIS — Z5181 Encounter for therapeutic drug level monitoring: Secondary | ICD-10-CM | POA: Diagnosis not present

## 2013-12-14 DIAGNOSIS — I4819 Other persistent atrial fibrillation: Secondary | ICD-10-CM

## 2013-12-14 LAB — POCT INR: INR: 1.9

## 2013-12-14 MED ORDER — WARFARIN SODIUM 5 MG PO TABS: ORAL_TABLET | ORAL | Status: DC

## 2013-12-15 MED ORDER — SOTALOL HCL 80 MG PO TABS: 80.0000 mg | ORAL_TABLET | Freq: Two times a day (BID) | ORAL | Status: DC

## 2013-12-15 MED ORDER — METOPROLOL TARTRATE 25 MG PO TABS: ORAL_TABLET | ORAL | Status: DC

## 2013-12-15 MED ORDER — SIMVASTATIN 80 MG PO TABS: 80.0000 mg | ORAL_TABLET | Freq: Every day | ORAL | Status: DC

## 2013-12-18 ENCOUNTER — Encounter: Payer: Self-pay | Admitting: Adult Health

## 2013-12-18 DIAGNOSIS — Z8739 Personal history of other diseases of the musculoskeletal system and connective tissue: Secondary | ICD-10-CM | POA: Insufficient documentation

## 2013-12-18 DIAGNOSIS — J302 Other seasonal allergic rhinitis: Secondary | ICD-10-CM | POA: Insufficient documentation

## 2013-12-18 DIAGNOSIS — M1990 Unspecified osteoarthritis, unspecified site: Secondary | ICD-10-CM | POA: Insufficient documentation

## 2014-01-04 ENCOUNTER — Other Ambulatory Visit: Payer: Medicare Other

## 2014-01-04 ENCOUNTER — Ambulatory Visit (INDEPENDENT_AMBULATORY_CARE_PROVIDER_SITE_OTHER): Payer: Medicare Other

## 2014-01-04 DIAGNOSIS — I4891 Unspecified atrial fibrillation: Secondary | ICD-10-CM

## 2014-01-04 DIAGNOSIS — Z5181 Encounter for therapeutic drug level monitoring: Secondary | ICD-10-CM | POA: Diagnosis not present

## 2014-01-04 DIAGNOSIS — I4819 Other persistent atrial fibrillation: Secondary | ICD-10-CM

## 2014-01-04 LAB — POCT INR: INR: 2.2

## 2014-01-09 ENCOUNTER — Encounter: Payer: Self-pay | Admitting: Adult Health

## 2014-02-01 ENCOUNTER — Ambulatory Visit (INDEPENDENT_AMBULATORY_CARE_PROVIDER_SITE_OTHER): Payer: Medicare Other

## 2014-02-01 DIAGNOSIS — I4891 Unspecified atrial fibrillation: Secondary | ICD-10-CM

## 2014-02-01 DIAGNOSIS — Z5181 Encounter for therapeutic drug level monitoring: Secondary | ICD-10-CM

## 2014-02-01 DIAGNOSIS — I4819 Other persistent atrial fibrillation: Secondary | ICD-10-CM

## 2014-02-01 LAB — POCT INR: INR: 2.2

## 2014-02-14 ENCOUNTER — Ambulatory Visit (INDEPENDENT_AMBULATORY_CARE_PROVIDER_SITE_OTHER): Payer: Medicare Other | Admitting: Adult Health

## 2014-02-14 ENCOUNTER — Encounter: Payer: Self-pay | Admitting: Adult Health

## 2014-02-14 VITALS — BP 114/73 | HR 65 | Temp 97.6°F | Resp 14 | Wt 196.2 lb

## 2014-02-14 DIAGNOSIS — L989 Disorder of the skin and subcutaneous tissue, unspecified: Secondary | ICD-10-CM

## 2014-02-14 DIAGNOSIS — H6120 Impacted cerumen, unspecified ear: Secondary | ICD-10-CM | POA: Diagnosis not present

## 2014-02-14 DIAGNOSIS — I2510 Atherosclerotic heart disease of native coronary artery without angina pectoris: Secondary | ICD-10-CM

## 2014-02-14 DIAGNOSIS — H6123 Impacted cerumen, bilateral: Secondary | ICD-10-CM

## 2014-02-14 NOTE — Progress Notes (Signed)
   Subjective:    Patient ID: Jeffery Reynolds, male    DOB: August 10, 1934, 80 y.o.   MRN: [id number]  HPI  Patient is a pleasant 80 year old male who presents to clinic with sensation of water in both ears. He reports he has had this sensation in the past and the result has been wax buildup. Denies pain, fever or any other symptom.  Has an area on his scalp that has been there for several months without healing. It is not bleeding and there is no drainage.   Past Medical History  Diagnosis Date  . A-fib 2000       . Gout     Controlled with Allopurinol  . Hyperlipidemia   . Hypertension   . Arthritis   . History of chicken pox   . GERD (gastroesophageal reflux disease)   . Genital warts   . Seasonal allergies   . Colon polyps   . History of blood transfusion   . CAD (coronary artery disease)      Review of Systems  HENT:       Sensation of fluid in both ears  Skin:       Scalp w/area that will not heal.  All other systems reviewed and are negative.      Objective:   Physical Exam  Constitutional: He is oriented to person, place, and time. He appears well-developed and well-nourished. No distress.  HENT:  Head: Normocephalic and atraumatic.  Bilateral ears impacted with cerumen  Eyes: Conjunctivae and EOM are normal.  Neck: Neck supple.  Cardiovascular: Normal rate and regular rhythm.   Pulmonary/Chest: Effort normal. No respiratory distress.  Musculoskeletal: Normal range of motion.  Neurological: He is alert and oriented to person, place, and time. Coordination normal.  Skin: Skin is warm and dry.  Skin lesion on scalp that feels rough. Pt reports the area does not heal. ? Basal cell given exposure to sun.  Psychiatric: He has a normal mood and affect. His behavior is normal. Judgment and thought content normal.   BP 114/73  Pulse 65  Temp(Src) 97.6 F (36.4 C) (Oral)  Resp 14  Wt 196 lb 4 oz (89.018 kg)  SpO2 98%      Assessment & Plan:   1. Cerumen  impaction, bilateral Irrigated both ears in clinic. Provided information on prevention of build up. Unable to clear with irrigation. Refer to ENT.  2. Skin lesion Area on scalp that will not heal. ?basal cell. Refer to Derm.

## 2014-02-14 NOTE — Progress Notes (Signed)
Pre visit review using our clinic review tool, if applicable. No additional management support is needed unless otherwise documented below in the visit note. 

## 2014-02-14 NOTE — Patient Instructions (Signed)
  Both ears were occluded with wax. You had them irrigated.   To prevent wax buildup within the ear:   Use equal parts of water and white vinegar  Soak a cotton ball in the solution and place several drops within the ear  Insert cotton ball in external ear canal and let sit for 30 minutes prior to shower  Remove cotton ball and gently irrigate the ear canal in the shower.  Do not irrigate directly into the ear but rather let it hit the external canal and irrigate.  For maintenance, this can be done 1 time weekly.

## 2014-02-24 ENCOUNTER — Encounter: Payer: Self-pay | Admitting: Cardiovascular Disease

## 2014-03-03 ENCOUNTER — Telehealth: Payer: Self-pay | Admitting: *Deleted

## 2014-03-03 NOTE — Telephone Encounter (Signed)
Informed patient that disc with his PCP records are at the front desk for pick up Patient verbalized understanding

## 2014-03-10 ENCOUNTER — Ambulatory Visit: Payer: Medicare Other | Admitting: Cardiovascular Disease

## 2014-03-15 ENCOUNTER — Ambulatory Visit (INDEPENDENT_AMBULATORY_CARE_PROVIDER_SITE_OTHER): Payer: Medicare Other

## 2014-03-15 DIAGNOSIS — I4819 Other persistent atrial fibrillation: Secondary | ICD-10-CM

## 2014-03-15 DIAGNOSIS — I4891 Unspecified atrial fibrillation: Secondary | ICD-10-CM

## 2014-03-15 DIAGNOSIS — Z5181 Encounter for therapeutic drug level monitoring: Secondary | ICD-10-CM | POA: Diagnosis not present

## 2014-03-15 LAB — POCT INR: INR: 1.9

## 2014-03-16 DIAGNOSIS — L578 Other skin changes due to chronic exposure to nonionizing radiation: Secondary | ICD-10-CM | POA: Diagnosis not present

## 2014-03-16 DIAGNOSIS — L821 Other seborrheic keratosis: Secondary | ICD-10-CM | POA: Diagnosis not present

## 2014-03-16 DIAGNOSIS — I789 Disease of capillaries, unspecified: Secondary | ICD-10-CM | POA: Diagnosis not present

## 2014-03-16 DIAGNOSIS — L820 Inflamed seborrheic keratosis: Secondary | ICD-10-CM | POA: Diagnosis not present

## 2014-03-24 ENCOUNTER — Ambulatory Visit (INDEPENDENT_AMBULATORY_CARE_PROVIDER_SITE_OTHER): Payer: Medicare Other | Admitting: Cardiovascular Disease

## 2014-03-24 ENCOUNTER — Encounter: Payer: Self-pay | Admitting: Cardiovascular Disease

## 2014-03-24 VITALS — BP 118/70 | HR 48 | Ht 67.0 in | Wt 197.0 lb

## 2014-03-24 DIAGNOSIS — I10 Essential (primary) hypertension: Secondary | ICD-10-CM

## 2014-03-24 DIAGNOSIS — I2510 Atherosclerotic heart disease of native coronary artery without angina pectoris: Secondary | ICD-10-CM

## 2014-03-24 DIAGNOSIS — I209 Angina pectoris, unspecified: Secondary | ICD-10-CM | POA: Diagnosis not present

## 2014-03-24 DIAGNOSIS — E785 Hyperlipidemia, unspecified: Secondary | ICD-10-CM | POA: Diagnosis not present

## 2014-03-24 DIAGNOSIS — I4891 Unspecified atrial fibrillation: Secondary | ICD-10-CM

## 2014-03-24 DIAGNOSIS — I25119 Atherosclerotic heart disease of native coronary artery with unspecified angina pectoris: Secondary | ICD-10-CM

## 2014-03-24 DIAGNOSIS — I4819 Other persistent atrial fibrillation: Secondary | ICD-10-CM

## 2014-03-24 NOTE — Patient Instructions (Signed)
Your physician recommends that you have labs today:  BMP Lipid and Liver Profile   Your physician recommends that you continue on your current medications as directed. Please refer to the Current Medication list given to you today.  Your physician wants you to follow-up in: 6 months. You will receive a reminder letter in the mail two months in advance. If you don't receive a letter, please call our office to schedule the follow-up appointment.

## 2014-03-24 NOTE — Assessment & Plan Note (Signed)
Continue treatment with simvastatin. Check fasting lipid and liver profile today.

## 2014-03-24 NOTE — Progress Notes (Signed)
HPI  This is a pleasant 80 year old man who is here today for a followup visit.  He has prolonged cardiac history. He underwent 6 vessel CABG in 1995 in [place]. He was diagnosed later with atrial fibrillation and was started on anticoagulation with warfarin. He has been maintaining in sinus rhythm with sotalol. He reports no recurrent ischemic events since his CABG. He has known history of hypertension, hyperlipidemia and previous tobacco use. There is family history of coronary artery disease. He is retired from [employer], [employer] (medical). He reports following up with a cardiologist on a regular basis. He has been doing very well and denies any chest pain, dyspnea or palpitations. He exercises regularly without exertional symptoms.  No Known Allergies   Current Outpatient Prescriptions on File Prior to Visit  Medication Sig Dispense Refill  . allopurinol (ZYLOPRIM) 300 MG tablet 300 mg. Take on Monday, Wednesday and Friday.      . ALPRAZolam (XANAX) 0.25 MG tablet Take 0.25 mg by mouth at bedtime.      Marland Kitchen HYDROcodone-acetaminophen (NORCO/VICODIN) 5-325 MG per tablet Take 1 tablet by mouth every 6 (six) hours as needed for moderate pain.      . metoprolol tartrate (LOPRESSOR) 25 MG tablet Take 12.5 mg in the am & 25 mg in the pm.  45 tablet  3  . Omeprazole Magnesium (PRILOSEC OTC PO) Take by mouth daily.      . sildenafil (VIAGRA) 100 MG tablet Take 100 mg by mouth daily as needed for erectile dysfunction.      . simvastatin (ZOCOR) 80 MG tablet Take 1 tablet (80 mg total) by mouth daily.  30 tablet  3  . sotalol (BETAPACE) 80 MG tablet Take 1 tablet (80 mg total) by mouth 2 (two) times daily.  60 tablet  3  . traZODone (DESYREL) 150 MG tablet Take by mouth at bedtime.      Marland Kitchen warfarin (COUMADIN) 5 MG tablet Take as directed by anticoagulation clinic  30 tablet  1   No current facility-administered medications on file prior to visit.     Past Medical History  Diagnosis Date  . A-fib 2000       . Gout     Controlled with Allopurinol  . Hyperlipidemia   . Hypertension   . Arthritis   . History of chicken pox   . GERD (gastroesophageal reflux disease)   . Genital warts   . Seasonal allergies   . Colon polyps   . History of blood transfusion   . CAD (coronary artery disease)   . Thrombocytopathia   . Sleep apnea, obstructive      Past Surgical History  Procedure Laterality Date  . Coronary artery bypass graft  1995    CABG x 6 in [place], [place]  . Knee surgery      bilateral   . Rotator cuff repair      right  . Gallbladder surgery    . Cardiac catheterization    . Tonsillectomy and adenoidectomy  1943  . Gynecomastia excision       Family History  Problem Relation Age of Onset  . Heart disease Father   . Heart attack Father 48    MI  . Alcohol abuse Father   . Hyperlipidemia Father   . Hypertension Father   . Heart attack Paternal Grandmother 3  . Heart attack Paternal Grandfather 97  . Cancer Mother     Breast cancer and Lung Cancer  . Diabetes Brother   .  Crohn's disease Daughter   . Cancer Brother     lung cancer  . Alcohol abuse Son      History   Social History  . Marital Status: Divorced    Spouse Name: N/A    Number of Children: 7  . Years of Education: 12   Occupational History  . 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 Smoking status: Never Smoker   . Smokeless tobacco: Not on file  . Alcohol Use: Yes     Comment: occasional   . Drug Use: No  . Sexual Activity: Not on file   Other Topics Concern  . Not on file   Social History Narrative   Mr. Gwyn grew up in [place], [place]. Most recently he was living in [place]. He moved to the [place] area since March 2015. Recently divorced and moved to this area to be close to one of his sons. He enjoys reading and has been going to the gym to exercise.              ROS A 10 point review of system was performed. It is negative other than that  mentioned in the history of present illness.   PHYSICAL EXAM   BP 118/70  Pulse 48  Ht 5\' 7"  (1.702 m)  Wt 197 lb (89.359 kg)  BMI 30.85 kg/m2 Constitutional: He is oriented to person, place, and time. He appears well-developed and well-nourished. No distress.  HENT: No nasal discharge.  Head: Normocephalic and atraumatic.  Eyes: Pupils are equal and round.  No discharge. Neck: Normal range of motion. Neck supple. No JVD present. No thyromegaly present.  Cardiovascular: Normal rate, regular rhythm, normal heart sounds. Exam reveals no gallop and no friction rub. No murmur heard.  Pulmonary/Chest: Effort normal and breath sounds normal. No stridor. No respiratory distress. He has no wheezes. He has no rales. He exhibits no tenderness.  Abdominal: Soft. Bowel sounds are normal. He exhibits no distension. There is no tenderness. There is no rebound and no guarding.  Musculoskeletal: Normal range of motion. He exhibits no edema and no tenderness.  Neurological: He is alert and oriented to person, place, and time. Coordination normal.  Skin: Skin is warm and dry. No rash noted. He is not diaphoretic. No erythema. No pallor.  Psychiatric: He has a normal mood and affect. His behavior is normal. Judgment and thought content normal.       EKG: Marked sinus  Bradycardia  -Left atrial enlargement.   -Prominent R(V1) -nonspecific.   BORDERLINE   ASSESSMENT AND PLAN

## 2014-03-24 NOTE — Assessment & Plan Note (Signed)
He is maintaining in sinus rhythm with sotalol. He is bradycardic but asymptomatic. Check basic metabolic profile to ensure normal renal function.

## 2014-03-24 NOTE — Assessment & Plan Note (Signed)
He is doing very well with no symptoms suggestive of angina. He exercises regularly without any exertional symptoms. Continue medical therapy.

## 2014-03-25 LAB — BASIC METABOLIC PANEL
BUN/Creatinine Ratio: 18 (ref 10–22)
BUN: 18 mg/dL (ref 8–27)
CO2: 26 mmol/L (ref 18–29)
Calcium: 9.1 mg/dL (ref 8.6–10.2)
Chloride: 103 mmol/L (ref 97–108)
Creatinine, Ser: 1.01 mg/dL (ref 0.76–1.27)
GFR calc Af Amer: 82 mL/min/{1.73_m2} (ref >59)
GFR calc non Af Amer: 71 mL/min/{1.73_m2} (ref >59)
Glucose: 102 mg/dL — ABNORMAL HIGH (ref 65–99)
Potassium: 4.4 mmol/L (ref 3.5–5.2)
Sodium: 142 mmol/L (ref 134–144)

## 2014-03-25 LAB — LIPID PANEL
Chol/HDL Ratio: 3.3 ratio units (ref 0.0–5.0)
Cholesterol, Total: 137 mg/dL (ref 100–199)
HDL: 42 mg/dL (ref >39)
LDL Calculated: 59 mg/dL (ref 0–99)
Triglycerides: 182 mg/dL — ABNORMAL HIGH (ref 0–149)
VLDL Cholesterol Cal: 36 mg/dL (ref 5–40)

## 2014-03-25 LAB — HEPATIC FUNCTION PANEL
ALT: 26 IU/L (ref 0–44)
AST: 23 IU/L (ref 0–40)
Albumin: 4 g/dL (ref 3.5–4.8)
Alkaline Phosphatase: 81 IU/L (ref 39–117)
Bilirubin, Direct: 0.19 mg/dL (ref 0.00–0.40)
Total Bilirubin: 0.6 mg/dL (ref 0.0–1.2)
Total Protein: 6 g/dL (ref 6.0–8.5)

## 2014-04-12 ENCOUNTER — Ambulatory Visit (INDEPENDENT_AMBULATORY_CARE_PROVIDER_SITE_OTHER): Payer: Medicare Other

## 2014-04-12 DIAGNOSIS — I4819 Other persistent atrial fibrillation: Secondary | ICD-10-CM

## 2014-04-12 DIAGNOSIS — I4891 Unspecified atrial fibrillation: Secondary | ICD-10-CM

## 2014-04-12 DIAGNOSIS — Z5181 Encounter for therapeutic drug level monitoring: Secondary | ICD-10-CM | POA: Diagnosis not present

## 2014-04-12 LAB — POCT INR: INR: 2.9

## 2014-04-18 ENCOUNTER — Ambulatory Visit (INDEPENDENT_AMBULATORY_CARE_PROVIDER_SITE_OTHER): Payer: Medicare Other | Admitting: Adult Health

## 2014-04-18 ENCOUNTER — Encounter: Payer: Self-pay | Admitting: Adult Health

## 2014-04-18 VITALS — BP 129/74 | HR 57 | Temp 97.9°F | Resp 14 | Wt 196.5 lb

## 2014-04-18 DIAGNOSIS — Z23 Encounter for immunization: Secondary | ICD-10-CM

## 2014-04-18 DIAGNOSIS — I2510 Atherosclerotic heart disease of native coronary artery without angina pectoris: Secondary | ICD-10-CM | POA: Diagnosis not present

## 2014-04-18 DIAGNOSIS — Z862 Personal history of diseases of the blood and blood-forming organs and certain disorders involving the immune mechanism: Secondary | ICD-10-CM

## 2014-04-18 DIAGNOSIS — M25569 Pain in unspecified knee: Secondary | ICD-10-CM | POA: Diagnosis not present

## 2014-04-18 DIAGNOSIS — R7309 Other abnormal glucose: Secondary | ICD-10-CM

## 2014-04-18 DIAGNOSIS — Z8639 Personal history of other endocrine, nutritional and metabolic disease: Secondary | ICD-10-CM

## 2014-04-18 DIAGNOSIS — Z8739 Personal history of other diseases of the musculoskeletal system and connective tissue: Secondary | ICD-10-CM

## 2014-04-18 DIAGNOSIS — M25562 Pain in left knee: Secondary | ICD-10-CM

## 2014-04-18 LAB — HEMOGLOBIN A1C: Hgb A1c MFr Bld: 5.1 % (ref 4.6–6.5)

## 2014-04-18 MED ORDER — HYDROCODONE-ACETAMINOPHEN 5-325 MG PO TABS: ORAL_TABLET | ORAL | Status: DC

## 2014-04-18 MED ORDER — TRAZODONE HCL 150 MG PO TABS
150.0000 mg | ORAL_TABLET | Freq: Every day | ORAL | Status: DC
Start: 2014-04-18 — End: 2015-04-24

## 2014-04-18 MED ORDER — ZOSTER VACCINE LIVE 19400 UNT/0.65ML ~~LOC~~ SOLR: 0.6500 mL | Freq: Once | SUBCUTANEOUS | Status: DC

## 2014-04-18 MED ORDER — ALLOPURINOL 300 MG PO TABS: ORAL_TABLET | ORAL | Status: DC

## 2014-04-18 NOTE — Progress Notes (Signed)
Pre visit review using our clinic review tool, if applicable. No additional management support is needed unless otherwise documented below in the visit note. 

## 2014-04-18 NOTE — Progress Notes (Signed)
Patient ID: Jeffery Reynolds, male   DOB: 1934-05-27, 80 y.o.   MRN: [id number]   Subjective:    Patient ID: Jeffery Reynolds, male    DOB: 01-09-1934, 81 y.o.   MRN: [id number]  HPI Pt is a pleasant 80 y/o male who presents to clinic for follow up:  1. Left knee pain Reports ongoing left knee pain since he had knee replacement. He had to have the knee re-done because of ongoing pain and worsening symptoms. He takes hydrocodone 1-2 times a day.  2. Elevated random blood glucose level Has had a few elevated blood sugar levels in the past. Has not had an A1c.  3. History of gout Reports that he takes his allopurinol as directed (3 times a week) which helps ward off gout attacks. Has nearly run out of medication  4. Shingles vaccine Has not had the vaccine. Reports hx of having shingles.  Pt will need his Medicare Wellness exam in December.   Past Medical History  Diagnosis Date  . A-fib 2000       . Gout     Controlled with Allopurinol  . Hyperlipidemia   . Hypertension   . Arthritis   . History of chicken pox   . GERD (gastroesophageal reflux disease)   . Genital warts   . Seasonal allergies   . Colon polyps   . History of blood transfusion   . CAD (coronary artery disease)   . Thrombocytopathia   . Sleep apnea, obstructive     Current Outpatient Prescriptions on File Prior to Visit  Medication Sig Dispense Refill  . ALPRAZolam (XANAX) 0.25 MG tablet Take 0.25 mg by mouth at bedtime.      . metoprolol tartrate (LOPRESSOR) 25 MG tablet Take 12.5 mg in the am & 25 mg in the pm.  45 tablet  3  . Omeprazole Magnesium (PRILOSEC OTC PO) Take by mouth daily.      . sildenafil (VIAGRA) 100 MG tablet Take 100 mg by mouth daily as needed for erectile dysfunction.      . simvastatin (ZOCOR) 80 MG tablet Take 1 tablet (80 mg total) by mouth daily.  30 tablet  3  . sotalol (BETAPACE) 80 MG tablet Take 1 tablet (80 mg total) by mouth 2 (two) times daily.  60 tablet  3  . warfarin  (COUMADIN) 5 MG tablet Take as directed by anticoagulation clinic  30 tablet  1   No current facility-administered medications on file prior to visit.     Review of Systems  Constitutional: Negative.   HENT: Negative.   Eyes: Negative.   Respiratory: Negative.   Cardiovascular: Negative.   Gastrointestinal: Negative.   Endocrine: Negative.   Genitourinary: Negative.   Musculoskeletal: Positive for back pain (low back pain, chronic).       Chronic left knee pain following knee replacement  Skin: Negative.   Allergic/Immunologic: Negative.   Neurological: Negative.   Hematological: Negative.   Psychiatric/Behavioral: Positive for sleep disturbance (well controlled with trazodone). Negative for behavioral problems and confusion. The patient is not nervous/anxious.   All other systems reviewed and are negative.      Objective:  BP 129/74  Pulse 57  Temp(Src) 97.9 F (36.6 C) (Oral)  Resp 14  Wt 196 lb 8 oz (89.132 kg)  SpO2 98%   Physical Exam  Constitutional: He is oriented to person, place, and time. He appears well-developed and well-nourished. No distress.  HENT:  Head: Normocephalic and atraumatic.  Eyes: Conjunctivae  and EOM are normal.  Neck: Neck supple.  Cardiovascular: Normal rate and regular rhythm.   Pulmonary/Chest: Effort normal. No respiratory distress.  Musculoskeletal: He exhibits no edema.  Left knee scar with atrophy of distal quad muscle noted  Neurological: He is alert and oriented to person, place, and time. Coordination normal.  Skin: Skin is warm and dry.  Psychiatric: He has a normal mood and affect. His behavior is normal. Judgment and thought content normal.      Assessment & Plan:   1. Left knee pain Chronic left knee pain following failed knee replacement surgery requiring revision. He has developed distal quadricep atrophy. He exercises regularly to prevent further atrophy and stiffening of joint. Refill hydrocodone #60, with 0 refills.     2. Elevated random blood glucose level Check labs. Follow - Hemoglobin A1c  3. History of gout Continue allopurinol. He is well controlled with taking medication 3 times a week. Follow  4. Need for shingles vaccine Will send prescription to his pharmacy for administration. - zoster vaccine live, PF, (ZOSTAVAX) 25427 UNT/0.65ML injection; Inject 19,400 Units into the skin once.  Dispense: 1 each; Refill: 0

## 2014-04-24 DIAGNOSIS — H9319 Tinnitus, unspecified ear: Secondary | ICD-10-CM | POA: Diagnosis not present

## 2014-04-24 DIAGNOSIS — H6120 Impacted cerumen, unspecified ear: Secondary | ICD-10-CM | POA: Diagnosis not present

## 2014-04-24 DIAGNOSIS — H93299 Other abnormal auditory perceptions, unspecified ear: Secondary | ICD-10-CM | POA: Diagnosis not present

## 2014-05-10 ENCOUNTER — Ambulatory Visit (INDEPENDENT_AMBULATORY_CARE_PROVIDER_SITE_OTHER): Payer: Medicare Other

## 2014-05-10 DIAGNOSIS — I4891 Unspecified atrial fibrillation: Secondary | ICD-10-CM | POA: Diagnosis not present

## 2014-05-10 DIAGNOSIS — Z5181 Encounter for therapeutic drug level monitoring: Secondary | ICD-10-CM

## 2014-05-10 DIAGNOSIS — I4819 Other persistent atrial fibrillation: Secondary | ICD-10-CM

## 2014-05-10 LAB — POCT INR: INR: 2.2

## 2014-05-11 ENCOUNTER — Other Ambulatory Visit: Payer: Self-pay | Admitting: *Deleted

## 2014-05-11 MED ORDER — SIMVASTATIN 80 MG PO TABS: 80.0000 mg | ORAL_TABLET | Freq: Every day | ORAL | Status: DC

## 2014-05-11 NOTE — Telephone Encounter (Signed)
Requested Prescriptions   Signed Prescriptions Disp Refills  . simvastatin (ZOCOR) 80 MG tablet 30 tablet 3    Sig: Take 1 tablet (80 mg total) by mouth daily.    Authorizing Provider: Kathlyn Sacramento A    Ordering User: Britt Bottom

## 2014-05-16 ENCOUNTER — Telehealth: Payer: Self-pay | Admitting: Cardiovascular Disease

## 2014-05-16 ENCOUNTER — Telehealth: Payer: Self-pay

## 2014-05-16 ENCOUNTER — Other Ambulatory Visit: Payer: Self-pay

## 2014-05-16 NOTE — Telephone Encounter (Signed)
Pt came in, not sure of which heart meds he needs refilled. Please call.

## 2014-05-16 NOTE — Telephone Encounter (Signed)
Pt needs refill on Simvastatin, please send it to  [facility] on [address].

## 2014-05-16 NOTE — Telephone Encounter (Signed)
LMTCB. Needs to clarify what medications he needs refilled.

## 2014-05-17 ENCOUNTER — Other Ambulatory Visit: Payer: Self-pay | Admitting: *Deleted

## 2014-05-17 MED ORDER — SIMVASTATIN 80 MG PO TABS: 80.0000 mg | ORAL_TABLET | Freq: Every day | ORAL | Status: DC

## 2014-05-17 NOTE — Telephone Encounter (Signed)
Requested Prescriptions   Signed Prescriptions Disp Refills  . simvastatin (ZOCOR) 80 MG tablet 30 tablet 3    Sig: Take 1 tablet (80 mg total) by mouth daily.    Authorizing Provider: Kathlyn Sacramento A    Ordering User: Britt Bottom

## 2014-06-01 ENCOUNTER — Telehealth: Payer: Self-pay | Admitting: Adult Health

## 2014-06-01 NOTE — Telephone Encounter (Signed)
Pt dropped off paper work for Astr[employer]. Paper work in Dr. Lupita Dawn box.msn

## 2014-06-02 ENCOUNTER — Encounter: Payer: Self-pay | Admitting: Internal Medicine

## 2014-06-02 DIAGNOSIS — Z96652 Presence of left artificial knee joint: Secondary | ICD-10-CM | POA: Insufficient documentation

## 2014-06-02 NOTE — Telephone Encounter (Signed)
Letter has been signed.

## 2014-06-02 NOTE — Telephone Encounter (Signed)
In red folder. 

## 2014-06-05 NOTE — Telephone Encounter (Signed)
Patient notified and placed at front desk 

## 2014-06-09 ENCOUNTER — Telehealth: Payer: Self-pay | Admitting: Internal Medicine

## 2014-06-09 NOTE — Telephone Encounter (Signed)
Please see below note

## 2014-06-09 NOTE — Telephone Encounter (Signed)
Former pt of Raquel's.  Please advise

## 2014-06-09 NOTE — Telephone Encounter (Signed)
Last refill and OV 8.25.15.  Please advise refill.

## 2014-06-09 NOTE — Telephone Encounter (Signed)
He will need to be seen and evaluated in clinic for any pain medication. I do not write for chronic narcotics. I would suggest another provider.

## 2014-06-09 NOTE — Telephone Encounter (Signed)
Jeffery Reynolds came by today with an empty bottle of Hydrocodone. On the bottle "no refills" was written. He'd like a refill Rx sent to his pharmacy if possible. If more information is needed, please call the patient. Pt ph# [phone] Thank you.

## 2014-06-09 NOTE — Telephone Encounter (Signed)
Left message for pt to return call.

## 2014-06-09 NOTE — Telephone Encounter (Signed)
I am out of office all day today and have never seen patient.  You should send this to on e of the other docs.

## 2014-06-10 ENCOUNTER — Ambulatory Visit (INDEPENDENT_AMBULATORY_CARE_PROVIDER_SITE_OTHER): Payer: Medicare Other

## 2014-06-10 DIAGNOSIS — Z23 Encounter for immunization: Secondary | ICD-10-CM | POA: Diagnosis not present

## 2014-06-12 ENCOUNTER — Telehealth: Payer: Self-pay | Admitting: Adult Health

## 2014-06-12 ENCOUNTER — Other Ambulatory Visit: Payer: Self-pay | Admitting: Internal Medicine

## 2014-06-12 MED ORDER — HYDROCODONE-ACETAMINOPHEN 5-325 MG PO TABS: ORAL_TABLET | ORAL | Status: DC

## 2014-06-12 NOTE — Telephone Encounter (Signed)
He needs a 15 minute visit  ASAP.  I will fill the vicodin only after meeting him.  rx printed, you can add him on  To Wednesday 4:30.  He will be reassigned to the new NP when she starts.

## 2014-06-12 NOTE — Telephone Encounter (Signed)
Patient called requesting a Rx for hydrocodone-Acetaminphhen. Patient last office visit with raquel rey was 04/18/14 and medication last filled 04/18/14. Patient has office visit scheduled with Dr. Derrel Nip 08/14/14. Please advise.

## 2014-06-12 NOTE — Telephone Encounter (Signed)
Pt needs refill on hydrocodone. Please advise when ready to pick up/msn

## 2014-06-12 NOTE — Telephone Encounter (Signed)
Please advise 

## 2014-06-13 NOTE — Telephone Encounter (Signed)
Please schedule patient for 4.30 06/14/14 per Dr. Derrel Nip. Patient is aware.

## 2014-06-13 NOTE — Telephone Encounter (Signed)
Patient has been scheduled

## 2014-06-14 ENCOUNTER — Ambulatory Visit (INDEPENDENT_AMBULATORY_CARE_PROVIDER_SITE_OTHER): Payer: Medicare Other | Admitting: Internal Medicine

## 2014-06-14 ENCOUNTER — Encounter: Payer: Self-pay | Admitting: Internal Medicine

## 2014-06-14 VITALS — BP 132/68 | HR 64 | Temp 98.4°F | Resp 16 | Wt 201.8 lb

## 2014-06-14 DIAGNOSIS — M25562 Pain in left knee: Secondary | ICD-10-CM

## 2014-06-14 DIAGNOSIS — G8929 Other chronic pain: Secondary | ICD-10-CM | POA: Diagnosis not present

## 2014-06-14 DIAGNOSIS — H04123 Dry eye syndrome of bilateral lacrimal glands: Secondary | ICD-10-CM | POA: Diagnosis not present

## 2014-06-14 DIAGNOSIS — I2510 Atherosclerotic heart disease of native coronary artery without angina pectoris: Secondary | ICD-10-CM | POA: Diagnosis not present

## 2014-06-14 MED ORDER — HYDROCODONE-ACETAMINOPHEN 5-325 MG PO TABS: ORAL_TABLET | ORAL | Status: DC

## 2014-06-14 NOTE — Patient Instructions (Signed)
I have refilled your vicodin for 3 months  Please return in late January for refills and for fasting labs  Your eye referral is in process

## 2014-06-14 NOTE — Progress Notes (Signed)
Pre visit review using our clinic review tool, if applicable. No additional management support is needed unless otherwise documented below in the visit note. 

## 2014-06-14 NOTE — Progress Notes (Signed)
Patient ID: Jeffery Reynolds, male   DOB: [birth year], 80 y.o.   MRN: [id number]  Patient Active Problem List   Diagnosis Date Noted  . Chronic pain of left knee 06/17/2014  . Chronic dryness of both eyes 06/14/2014  . S/P left unicompartmental knee replacement 06/02/2014  . Seasonal allergies 12/18/2013  . Arthritis 12/18/2013  . H/O: gout 12/18/2013  . Encounter for therapeutic drug monitoring 12/14/2013  . Coronary artery disease   . Persistent atrial fibrillation   . Hyperlipidemia   . Hypertension     Subjective:  CC:   Chief Complaint  Patient presents with  . Medication Refill    HPI:   Jeffery Reynolds is a 80 y.o. male who presents for  Need for refill on narcotics.  Patient was previously seen by Raquel Rey,  And was prescribed narcotics for chronic left knee pain.  He has a history of two prior arthroplasties.  The Last one was done in 2009, in [place] and was complicated by infection. .  After being treated via PICC Line for presumed infection and hardware removal with spacer .  He has chronc daily svere pain tha ti smanage with 2 daily doses of hydrocodone/apap .       Past Medical History  Diagnosis Date  . A-fib 2000       . Gout     Controlled with Allopurinol  . Hyperlipidemia   . Hypertension   . Arthritis   . History of chicken pox   . GERD (gastroesophageal reflux disease)   . Genital warts   . Seasonal allergies   . Colon polyps   . History of blood transfusion   . CAD (coronary artery disease)   . Thrombocytopathia   . Sleep apnea, obstructive     Past Surgical History  Procedure Laterality Date  . Coronary artery bypass graft  1995    CABG x 6 in [place], [place]  . Knee surgery      bilateral   . Rotator cuff repair      right  . Gallbladder surgery    . Cardiac catheterization    . Tonsillectomy and adenoidectomy  1943  . Gynecomastia excision         The following portions of the patient's history were reviewed and updated as  appropriate: Allergies, current medications, and problem list.    Review of Systems:   Patient denies headache, fevers, malaise, unintentional weight loss, skin rash, eye pain, sinus congestion and sinus pain, sore throat, dysphagia,  hemoptysis , cough, dyspnea, wheezing, chest pain, palpitations, orthopnea, edema, abdominal pain, nausea, melena, diarrhea, constipation, flank pain, dysuria, hematuria, urinary  Frequency, nocturia, numbness, tingling, seizures,  Focal weakness, Loss of consciousness,  Tremor, insomnia, depression, anxiety, and suicidal ideation.     History   Social History  . Marital Status: Divorced    Spouse Name: N/A    Number of Children: 7  . Years of Education: 12   Occupational History  . 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 Smoking status: Never Smoker   . Smokeless tobacco: Not on file  . Alcohol Use: Yes     Comment: occasional   . Drug Use: No  . Sexual Activity: Not on file   Other Topics Concern  . Not on file   Social History Narrative   Mr. Jessop grew up in [place], [place]. Most recently he was living in [place]. He  moved to the [place] area since March 2015. Recently divorced and moved to this area to be close to one of his sons. He enjoys reading and has been going to the gym to exercise.             Objective:  Filed Vitals:   06/14/14 1712  BP: 132/68  Pulse: 64  Temp: 98.4 F (36.9 C)  Resp: 16     General appearance: alert, cooperative and appears stated age Ears: normal TM's and external ear canals both ears Throat: lips, mucosa, and tongue normal; teeth and gums normal Neck: no adenopathy, no carotid bruit, supple, symmetrical, trachea midline and thyroid not enlarged, symmetric, no tenderness/mass/nodules Back: symmetric, no curvature. ROM normal. No CVA tenderness. Lungs: clear to auscultation bilaterally Heart: regular rate and rhythm, S1, S2 normal, no murmur, click,  rub or gallop Abdomen: soft, non-tender; bowel sounds normal; no masses,  no organomegaly Pulses: 2+ and symmetric Skin: Skin color, texture, turgor normal. No rashes or lesions Lymph nodes: Cervical, supraclavicular, and axillary nodes normal.  Assessment and Plan:  Chronic pain of left knee Secondary to DJD, s.p arthroplasty x 2.  Refills on vicodin given for 3 months. The risks and benefits of narcotics use were discussed with patient today including excessive sedation leading to respiratory depression,  impaired thinking/driving, and addiction.  Patient was advised to avoid concurrent use with alcohol, to use medication only as needed and not to share with others  .    Updated Medication List Outpatient Encounter Prescriptions as of 06/14/2014  Medication Sig  . allopurinol (ZYLOPRIM) 300 MG tablet Take on Monday, Wednesday and Friday.  . ALPRAZolam (XANAX) 0.25 MG tablet Take 0.25 mg by mouth at bedtime.  Marland Kitchen HYDROcodone-acetaminophen (NORCO/VICODIN) 5-325 MG per tablet Take 1 tablet twice a day as needed.  . metoprolol tartrate (LOPRESSOR) 25 MG tablet Take 12.5 mg in the am & 25 mg in the pm.  . Omeprazole Magnesium (PRILOSEC OTC PO) Take by mouth daily.  . sildenafil (VIAGRA) 100 MG tablet Take 100 mg by mouth daily as needed for erectile dysfunction.  . simvastatin (ZOCOR) 80 MG tablet Take 1 tablet (80 mg total) by mouth daily.  . sotalol (BETAPACE) 80 MG tablet Take 1 tablet (80 mg total) by mouth 2 (two) times daily.  . traZODone (DESYREL) 150 MG tablet Take 1 tablet (150 mg total) by mouth at bedtime.  Marland Kitchen warfarin (COUMADIN) 5 MG tablet Take as directed by anticoagulation clinic  . zoster vaccine live, PF, (ZOSTAVAX) 16109 UNT/0.65ML injection Inject 19,400 Units into the skin once.  . [DISCONTINUED] HYDROcodone-acetaminophen (NORCO/VICODIN) 5-325 MG per tablet Take 1 tablet twice a day as needed.  . [DISCONTINUED] HYDROcodone-acetaminophen (NORCO/VICODIN) 5-325 MG per tablet  Take 1 tablet twice a day as needed.  . [DISCONTINUED] HYDROcodone-acetaminophen (NORCO/VICODIN) 5-325 MG per tablet Take 1 tablet twice a day as needed.     Orders Placed This Encounter  Procedures  . Ambulatory referral to Ophthalmology    No Follow-up on file.

## 2014-06-17 DIAGNOSIS — M25562 Pain in left knee: Secondary | ICD-10-CM

## 2014-06-17 DIAGNOSIS — G8929 Other chronic pain: Secondary | ICD-10-CM | POA: Insufficient documentation

## 2014-06-17 NOTE — Assessment & Plan Note (Signed)
Secondary to DJD, s.p arthroplasty x 2.  Refills on vicodin given for 3 months. The risks and benefits of narcotics use were discussed with patient today including excessive sedation leading to respiratory depression,  impaired thinking/driving, and addiction.  Patient was advised to avoid concurrent use with alcohol, to use medication only as needed and not to share with others  .

## 2014-07-07 ENCOUNTER — Telehealth: Payer: Self-pay | Admitting: Cardiovascular Disease

## 2014-07-07 NOTE — Telephone Encounter (Signed)
New message      Patient is having dizziness episodes.  No other symptoms.  Please advise.

## 2014-07-07 NOTE — Telephone Encounter (Signed)
You can add to my schedule next week.

## 2014-07-07 NOTE — Telephone Encounter (Signed)
Patient had two dizzy spells yesterday  One was sitting to standing  The second was mid walk in the grocery store  Today his blood pressure was 131/76 Heart rate 76 He has had three dizzy spells today   I informed patient that I will discuss symptoms with Dr. Fletcher Anon  Instructed patient to contact EMS if he feels his situation becomes emergent

## 2014-07-10 NOTE — Telephone Encounter (Signed)
Gabriel Cirri to Schedule 11/16

## 2014-07-11 ENCOUNTER — Encounter: Payer: Self-pay | Admitting: Cardiovascular Disease

## 2014-07-11 ENCOUNTER — Ambulatory Visit (INDEPENDENT_AMBULATORY_CARE_PROVIDER_SITE_OTHER): Payer: Medicare Other | Admitting: Cardiovascular Disease

## 2014-07-11 VITALS — BP 108/70 | HR 58 | Ht 67.0 in | Wt 200.4 lb

## 2014-07-11 DIAGNOSIS — I2510 Atherosclerotic heart disease of native coronary artery without angina pectoris: Secondary | ICD-10-CM | POA: Diagnosis not present

## 2014-07-11 DIAGNOSIS — I4819 Other persistent atrial fibrillation: Secondary | ICD-10-CM

## 2014-07-11 DIAGNOSIS — I10 Essential (primary) hypertension: Secondary | ICD-10-CM

## 2014-07-11 DIAGNOSIS — I481 Persistent atrial fibrillation: Secondary | ICD-10-CM

## 2014-07-11 DIAGNOSIS — E785 Hyperlipidemia, unspecified: Secondary | ICD-10-CM | POA: Diagnosis not present

## 2014-07-11 NOTE — Assessment & Plan Note (Signed)
He is maintaining in sinus rhythm on sotalol. I suspect that his dizziness might have been related to bradycardia which has been documented in the past. Thus, I discontinued metoprolol. He is on long-term anticoagulation with warfarin with no side effects.

## 2014-07-11 NOTE — Assessment & Plan Note (Signed)
Blood pressure readings have been close to normal. If blood pressure goes up after stopping metoprolol, we can consider adding an ace inhibitor/ARB or amlodipine.

## 2014-07-11 NOTE — Assessment & Plan Note (Signed)
He has no symptoms of angina. Continue medical therapy. 

## 2014-07-11 NOTE — Assessment & Plan Note (Signed)
Lab Results  Component Value Date   HDL 42 03/24/2014   LDLCALC 59 03/24/2014   TRIG 182* 03/24/2014   CHOLHDL 3.3 03/24/2014   LDL was at target. Continue treatment with simvastatin.

## 2014-07-11 NOTE — Progress Notes (Signed)
HPI  This is a pleasant 80 year old man who is here today for a followup visit.  He has prolonged cardiac history. He underwent 6 vessel CABG in 1995 in [place]. He was diagnosed later with atrial fibrillation and was started on anticoagulation with warfarin. He has been maintaining in sinus rhythm with sotalol. He reports no recurrent ischemic events since his CABG. He has known history of hypertension, hyperlipidemia and previous tobacco use. There is family history of coronary artery disease. He is retired from [employer], [employer] (medical).  He called our office last week for dizziness. Vital signs were normal. He had intermittent lightheadedness only for a day or 2. He feels back to his normal self now. He denies chest pain or shortness of breath. No syncope or presyncope.   No Known Allergies   Current Outpatient Prescriptions on File Prior to Visit  Medication Sig Dispense Refill  . allopurinol (ZYLOPRIM) 300 MG tablet Take on Monday, Wednesday and Friday. 30 tablet 6  . ALPRAZolam (XANAX) 0.25 MG tablet Take 0.25 mg by mouth at bedtime.    Marland Kitchen HYDROcodone-acetaminophen (NORCO/VICODIN) 5-325 MG per tablet Take 1 tablet twice a day as needed. 60 tablet 0  . Omeprazole Magnesium (PRILOSEC OTC PO) Take by mouth daily.    . sildenafil (VIAGRA) 100 MG tablet Take 100 mg by mouth daily as needed for erectile dysfunction.    . simvastatin (ZOCOR) 80 MG tablet Take 1 tablet (80 mg total) by mouth daily. 30 tablet 3  . sotalol (BETAPACE) 80 MG tablet Take 1 tablet (80 mg total) by mouth 2 (two) times daily. 60 tablet 3  . traZODone (DESYREL) 150 MG tablet Take 1 tablet (150 mg total) by mouth at bedtime. 30 tablet 6  . warfarin (COUMADIN) 5 MG tablet Take as directed by anticoagulation clinic 30 tablet 1  . zoster vaccine live, PF, (ZOSTAVAX) 93716 UNT/0.65ML injection Inject 19,400 Units into the skin once. 1 each 0   No current facility-administered medications on file prior to visit.     Past Medical History    Diagnosis Date  . A-fib 2000       . Gout     Controlled with Allopurinol  . Hyperlipidemia   . Hypertension   . Arthritis   . History of chicken pox   . GERD (gastroesophageal reflux disease)   . Genital warts   . Seasonal allergies   . Colon polyps   . History of blood transfusion   . CAD (coronary artery disease)   . Thrombocytopathia   . Sleep apnea, obstructive      Past Surgical History  Procedure Laterality Date  . Coronary artery bypass graft  1995    CABG x 6 in [place], [place]  . Knee surgery      bilateral   . Rotator cuff repair      right  . Gallbladder surgery    . Cardiac catheterization    . Tonsillectomy and adenoidectomy  1943  . Gynecomastia excision       Family History  Problem Relation Age of Onset  . Heart disease Father   . Heart attack Father 80    MI  . Alcohol abuse Father   . Hyperlipidemia Father   . Hypertension Father   . Heart attack Paternal Grandmother 96  . Heart attack Paternal Grandfather 37  . Cancer Mother     Breast cancer and Lung Cancer  . Diabetes Brother   . Crohn's disease Daughter   . Cancer Brother  lung cancer  . Alcohol abuse Son      History   Social History  . Marital Status: Divorced    Spouse Name: N/A    Number of Children: 7  . Years of Education: 12   Occupational History  . 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 Smoking status: Never Smoker   . Smokeless tobacco: Not on file  . Alcohol Use: Yes     Comment: occasional   . Drug Use: No  . Sexual Activity: Not on file   Other Topics Concern  . Not on file   Social History Narrative   Mr. Freimark grew up in [place], [place]. Most recently he was living in [place]. He moved to the [place] area since March 2015. Recently divorced and moved to this area to be close to one of his sons. He enjoys reading and has been going to the gym to exercise.              ROS A 10 point review of  system was performed. It is negative other than that mentioned in the history of present illness.   PHYSICAL EXAM   BP 108/70 mmHg  Pulse 58  Ht 5\' 7"  (1.702 m)  Wt 200 lb 6.4 oz (90.901 kg)  BMI 31.38 kg/m2 Constitutional: He is oriented to person, place, and time. He appears well-developed and well-nourished. No distress.  HENT: No nasal discharge.  Head: Normocephalic and atraumatic.  Eyes: Pupils are equal and round.  No discharge. Neck: Normal range of motion. Neck supple. No JVD present. No thyromegaly present.  Cardiovascular: Normal rate, regular rhythm, normal heart sounds. Exam reveals no gallop and no friction rub. No murmur heard.  Pulmonary/Chest: Effort normal and breath sounds normal. No stridor. No respiratory distress. He has no wheezes. He has no rales. He exhibits no tenderness.  Abdominal: Soft. Bowel sounds are normal. He exhibits no distension. There is no tenderness. There is no rebound and no guarding.  Musculoskeletal: Normal range of motion. He exhibits no edema and no tenderness.  Neurological: He is alert and oriented to person, place, and time. Coordination normal.  Skin: Skin is warm and dry. No rash noted. He is not diaphoretic. No erythema. No pallor.  Psychiatric: He has a normal mood and affect. His behavior is normal. Judgment and thought content normal.        ASSESSMENT AND PLAN

## 2014-07-11 NOTE — Patient Instructions (Signed)
Your physician has recommended you make the following change in your medication:  Stop Metoprolol   Your physician recommends that you schedule a follow-up appointment in:  2 months with Dr. Fletcher Anon

## 2014-07-12 ENCOUNTER — Telehealth: Payer: Self-pay | Admitting: *Deleted

## 2014-07-12 ENCOUNTER — Ambulatory Visit (INDEPENDENT_AMBULATORY_CARE_PROVIDER_SITE_OTHER): Payer: Medicare Other

## 2014-07-12 DIAGNOSIS — I481 Persistent atrial fibrillation: Secondary | ICD-10-CM

## 2014-07-12 DIAGNOSIS — Z5181 Encounter for therapeutic drug level monitoring: Secondary | ICD-10-CM

## 2014-07-12 DIAGNOSIS — H16223 Keratoconjunctivitis sicca, not specified as Sjogren's, bilateral: Secondary | ICD-10-CM | POA: Diagnosis not present

## 2014-07-12 DIAGNOSIS — I4819 Other persistent atrial fibrillation: Secondary | ICD-10-CM

## 2014-07-12 LAB — POCT INR: INR: 3

## 2014-07-12 NOTE — Telephone Encounter (Signed)
Please call patient regarding Metroprolol  Due to  his afib. Patient feels like he needs to stay on it.

## 2014-07-14 MED ORDER — METOPROLOL TARTRATE 25 MG PO TABS: 12.5000 mg | ORAL_TABLET | Freq: Two times a day (BID) | ORAL | Status: DC

## 2014-07-14 NOTE — Telephone Encounter (Signed)
Informed patient of Dr. Jacklynn Ganong response  Patient verbalized understanding

## 2014-07-14 NOTE — Telephone Encounter (Signed)
Patient stated he is afraid to come completely off metoprolol  He stated it has been controlling his afib for years and his heart rate is always in the 50's  He asked if he could maybe just decrease the dose or take it if he has an episode of afib

## 2014-07-14 NOTE — Telephone Encounter (Signed)
Sotalol has been controlling his A-fib.  If he wants, he can take Metoprolol 12.5 mg bid.

## 2014-07-24 ENCOUNTER — Other Ambulatory Visit: Payer: Self-pay

## 2014-07-24 DIAGNOSIS — G8929 Other chronic pain: Secondary | ICD-10-CM

## 2014-07-24 DIAGNOSIS — M25562 Pain in left knee: Principal | ICD-10-CM

## 2014-07-24 NOTE — Telephone Encounter (Signed)
At his last visit with me he was given refills for 3 months, per chart. The last one printed was for refill on Dec 19th.

## 2014-07-24 NOTE — Telephone Encounter (Signed)
Last OV 10.21.15, former pt of Raquel's.  Please advise refill

## 2014-07-24 NOTE — Telephone Encounter (Signed)
The patient called and is hoping to get a refill of his hydrocodone medication.   Callback - [phone]

## 2014-07-25 MED ORDER — HYDROCODONE-ACETAMINOPHEN 5-325 MG PO TABS: ORAL_TABLET | ORAL | Status: DC

## 2014-07-25 NOTE — Telephone Encounter (Signed)
Spoke to pharmacy, last refilled #60 06/15/14, no new Rxs dropped off. Pt notified of policy when he came by office to check status. Advised I would call him when ready for pickup. Ok to print?

## 2014-07-25 NOTE — Telephone Encounter (Signed)
Spoke to patient, remembers now getting the Rxs, but does not know what he did with them. Requesting new Rx

## 2014-07-25 NOTE — Telephone Encounter (Signed)
We will refill one month at a time until he can see Lorane Gell is new provider. rx printed

## 2014-07-25 NOTE — Telephone Encounter (Signed)
Pt notified,  verbalized understanding. Notified Rx ready for pickup

## 2014-07-25 NOTE — Telephone Encounter (Signed)
Well then,  Remind the patient of our  controlled substance policy .Please Call the pharmacy and see if they have been filled or dropped off.  Thank you

## 2014-08-09 DIAGNOSIS — H16223 Keratoconjunctivitis sicca, not specified as Sjogren's, bilateral: Secondary | ICD-10-CM | POA: Diagnosis not present

## 2014-08-09 NOTE — Progress Notes (Signed)
This encounter was created in error - please disregard.

## 2014-08-14 ENCOUNTER — Ambulatory Visit (INDEPENDENT_AMBULATORY_CARE_PROVIDER_SITE_OTHER): Payer: Medicare Other | Admitting: Internal Medicine

## 2014-08-14 ENCOUNTER — Encounter: Payer: Self-pay | Admitting: Internal Medicine

## 2014-08-14 VITALS — BP 120/78 | HR 69 | Temp 98.4°F | Resp 16 | Ht 67.0 in | Wt 204.0 lb

## 2014-08-14 DIAGNOSIS — E559 Vitamin D deficiency, unspecified: Secondary | ICD-10-CM | POA: Diagnosis not present

## 2014-08-14 DIAGNOSIS — Z0000 Encounter for general adult medical examination without abnormal findings: Secondary | ICD-10-CM

## 2014-08-14 DIAGNOSIS — Z79899 Other long term (current) drug therapy: Secondary | ICD-10-CM | POA: Diagnosis not present

## 2014-08-14 DIAGNOSIS — Z23 Encounter for immunization: Secondary | ICD-10-CM

## 2014-08-14 DIAGNOSIS — G8929 Other chronic pain: Secondary | ICD-10-CM

## 2014-08-14 DIAGNOSIS — D126 Benign neoplasm of colon, unspecified: Secondary | ICD-10-CM

## 2014-08-14 DIAGNOSIS — I2510 Atherosclerotic heart disease of native coronary artery without angina pectoris: Secondary | ICD-10-CM

## 2014-08-14 DIAGNOSIS — E785 Hyperlipidemia, unspecified: Secondary | ICD-10-CM

## 2014-08-14 DIAGNOSIS — R5383 Other fatigue: Secondary | ICD-10-CM | POA: Diagnosis not present

## 2014-08-14 DIAGNOSIS — K5909 Other constipation: Secondary | ICD-10-CM

## 2014-08-14 DIAGNOSIS — D696 Thrombocytopenia, unspecified: Secondary | ICD-10-CM

## 2014-08-14 DIAGNOSIS — M25562 Pain in left knee: Secondary | ICD-10-CM

## 2014-08-14 DIAGNOSIS — Z1159 Encounter for screening for other viral diseases: Secondary | ICD-10-CM

## 2014-08-14 LAB — CBC WITH DIFFERENTIAL/PLATELET
Basophils Absolute: 0 10*3/uL (ref 0.0–0.1)
Basophils Relative: 0.3 % (ref 0.0–3.0)
Eosinophils Absolute: 0.1 10*3/uL (ref 0.0–0.7)
Eosinophils Relative: 2.8 % (ref 0.0–5.0)
HCT: 42.3 % (ref 39.0–52.0)
Hemoglobin: 13.9 g/dL (ref 13.0–17.0)
Lymphocytes Relative: 21.4 % (ref 12.0–46.0)
Lymphs Abs: 0.8 10*3/uL (ref 0.7–4.0)
MCHC: 32.9 g/dL (ref 30.0–36.0)
MCV: 97.9 fl (ref 78.0–100.0)
Monocytes Absolute: 0.3 10*3/uL (ref 0.1–1.0)
Monocytes Relative: 8.8 % (ref 3.0–12.0)
Neutro Abs: 2.5 10*3/uL (ref 1.4–7.7)
Neutrophils Relative %: 66.7 % (ref 43.0–77.0)
Platelets: 79 10*3/uL — ABNORMAL LOW (ref 150.0–400.0)
RBC: 4.32 Mil/uL (ref 4.22–5.81)
RDW: 14 % (ref 11.5–15.5)
WBC: 3.7 10*3/uL — ABNORMAL LOW (ref 4.0–10.5)

## 2014-08-14 LAB — COMPREHENSIVE METABOLIC PANEL
ALT: 29 U/L (ref 0–53)
AST: 26 U/L (ref 0–37)
Albumin: 4.1 g/dL (ref 3.5–5.2)
Alkaline Phosphatase: 66 U/L (ref 39–117)
BUN: 28 mg/dL — ABNORMAL HIGH (ref 6–23)
CO2: 28 mEq/L (ref 19–32)
Calcium: 9 mg/dL (ref 8.4–10.5)
Chloride: 104 mEq/L (ref 96–112)
Creatinine, Ser: 1.1 mg/dL (ref 0.4–1.5)
GFR: 72.42 mL/min (ref >60.00)
Glucose, Bld: 103 mg/dL — ABNORMAL HIGH (ref 70–99)
Potassium: 4.5 mEq/L (ref 3.5–5.1)
Sodium: 139 mEq/L (ref 135–145)
Total Bilirubin: 1.1 mg/dL (ref 0.2–1.2)
Total Protein: 6.2 g/dL (ref 6.0–8.3)

## 2014-08-14 LAB — LIPID PANEL
Cholesterol: 141 mg/dL (ref 0–200)
HDL: 36.8 mg/dL — ABNORMAL LOW (ref >39.00)
LDL Cholesterol: 73 mg/dL (ref 0–99)
NonHDL: 104.2
Total CHOL/HDL Ratio: 4
Triglycerides: 154 mg/dL — ABNORMAL HIGH (ref 0.0–149.0)
VLDL: 30.8 mg/dL (ref 0.0–40.0)

## 2014-08-14 MED ORDER — ALPRAZOLAM 0.25 MG PO TABS: 0.2500 mg | ORAL_TABLET | Freq: Every day | ORAL | Status: DC

## 2014-08-14 MED ORDER — TRAMADOL HCL 50 MG PO TABS: 50.0000 mg | ORAL_TABLET | Freq: Every day | ORAL | Status: DC

## 2014-08-14 NOTE — Progress Notes (Signed)
Pre visit review using our clinic review tool, if applicable. No additional management support is needed unless otherwise documented below in the visit note. 

## 2014-08-14 NOTE — Patient Instructions (Addendum)
Take your trazodone one hour before bed and save the early alprazolam  For early morning wakeups  Trial of tramadol in the eveing for knee pain control ( in addition the the vicodin you are taking earlier)   You may need to take dulcolax , Ex Lax, or Sennakot S on the days you take the vicodin to prevent constipation  I Recommend  an increase in fiber intake to 25 to 35 grams daily.   Here are suggestions of high fiber content flatbreads:  Mission low carb whole wheat tortillas which have 26 g fiber/serving, Toufayan flatbread which has around 10 g fiber,  A daily serving of any type of nuts,  and Atkins protein bars which have 8 to 10 g fiber.       It is perfectly ok to take  Miralax., metamucil, citrucel, benefiber  or fibercon on a daily  Basis.    Health Maintenance A healthy lifestyle and preventative care can promote health and wellness.  Maintain regular health, dental, and eye exams.  Eat a healthy diet. Foods like vegetables, fruits, whole grains, low-fat dairy products, and lean protein foods contain the nutrients you need and are low in calories. Decrease your intake of foods high in solid fats, added sugars, and salt. Get information about a proper diet from your health care provider, if necessary.  Regular physical exercise is one of the most important things you can do for your health. Most adults should get at least 150 minutes of moderate-intensity exercise (any activity that increases your heart rate and causes you to sweat) each week. In addition, most adults need muscle-strengthening exercises on 2 or more days a week.   Maintain a healthy weight. The body mass index (BMI) is a screening tool to identify possible weight problems. It provides an estimate of body fat based on height and weight. Your health care provider can find your BMI and can help you achieve or maintain a healthy weight. For males 20 years and older:  A BMI below 18.5 is considered underweight.  A  BMI of 18.5 to 24.9 is normal.  A BMI of 25 to 29.9 is considered overweight.  A BMI of 30 and above is considered obese.  Maintain normal blood lipids and cholesterol by exercising and minimizing your intake of saturated fat. Eat a balanced diet with plenty of fruits and vegetables. Blood tests for lipids and cholesterol should begin at age 39 and be repeated every 5 years. If your lipid or cholesterol levels are high, you are over age 10, or you are at high risk for heart disease, you may need your cholesterol levels checked more frequently.Ongoing high lipid and cholesterol levels should be treated with medicines if diet and exercise are not working.  If you smoke, find out from your health care provider how to quit. If you do not use tobacco, do not start.  Lung cancer screening is recommended for adults aged 35-80 years who are at high risk for developing lung cancer because of a history of smoking. A yearly low-dose CT scan of the lungs is recommended for people who have at least a 30-pack-year history of smoking and are current smokers or have quit within the past 15 years. A pack year of smoking is smoking an average of 1 pack of cigarettes a day for 1 year (for example, a 30-pack-year history of smoking could mean smoking 1 pack a day for 30 years or 2 packs a day for 15 years). Yearly screening  should continue until the smoker has stopped smoking for at least 15 years. Yearly screening should be stopped for people who develop a health problem that would prevent them from having lung cancer treatment.  If you choose to drink alcohol, do not have more than 2 drinks per day. One drink is considered to be 12 oz (360 mL) of beer, 5 oz (150 mL) of wine, or 1.5 oz (45 mL) of liquor.  Avoid the use of street drugs. Do not share needles with anyone. Ask for help if you need support or instructions about stopping the use of drugs.  High blood pressure causes heart disease and increases the risk of  stroke. Blood pressure should be checked at least every 1-2 years. Ongoing high blood pressure should be treated with medicines if weight loss and exercise are not effective.  If you are 53-82 years old, ask your health care provider if you should take aspirin to prevent heart disease.  Diabetes screening involves taking a blood sample to check your fasting blood sugar level. This should be done once every 3 years after age 2 if you are at a normal weight and without risk factors for diabetes. Testing should be considered at a younger age or be carried out more frequently if you are overweight and have at least 1 risk factor for diabetes.  Colorectal cancer can be detected and often prevented. Most routine colorectal cancer screening begins at the age of 77 and continues through age 27. However, your health care provider may recommend screening at an earlier age if you have risk factors for colon cancer. On a yearly basis, your health care provider may provide home test kits to check for hidden blood in the stool. A small camera at the end of a tube may be used to directly examine the colon (sigmoidoscopy or colonoscopy) to detect the earliest forms of colorectal cancer. Talk to your health care provider about this at age 40 when routine screening begins. A direct exam of the colon should be repeated every 5-10 years through age 72, unless early forms of precancerous polyps or small growths are found.  People who are at an increased risk for hepatitis B should be screened for this virus. You are considered at high risk for hepatitis B if:  You were born in a country where hepatitis B occurs often. Talk with your health care provider about which countries are considered high risk.  Your parents were born in a high-risk country and you have not received a shot to protect against hepatitis B (hepatitis B vaccine).  You have HIV or AIDS.  You use needles to inject street drugs.  You live with, or have  sex with, someone who has hepatitis B.  You are a man who has sex with other men (MSM).  You get hemodialysis treatment.  You take certain medicines for conditions like cancer, organ transplantation, and autoimmune conditions.  Hepatitis C blood testing is recommended for all people born from [birth year] through 1963 and any individual with known risk factors for hepatitis C.  Healthy men should no longer receive prostate-specific antigen (PSA) blood tests as part of routine cancer screening. Talk to your health care provider about prostate cancer screening.  Testicular cancer screening is not recommended for adolescents or adult males who have no symptoms. Screening includes self-exam, a health care provider exam, and other screening tests. Consult with your health care provider about any symptoms you have or any concerns you have about testicular  cancer.  Practice safe sex. Use condoms and avoid high-risk sexual practices to reduce the spread of sexually transmitted infections (STIs).  You should be screened for STIs, including gonorrhea and chlamydia if:  You are sexually active and are younger than 24 years.  You are older than 24 years, and your health care provider tells you that you are at risk for this type of infection.  Your sexual activity has changed since you were last screened, and you are at an increased risk for chlamydia or gonorrhea. Ask your health care provider if you are at risk.  If you are at risk of being infected with HIV, it is recommended that you take a prescription medicine daily to prevent HIV infection. This is called pre-exposure prophylaxis (PrEP). You are considered at risk if:  You are a man who has sex with other men (MSM).  You are a heterosexual man who is sexually active with multiple partners.  You take drugs by injection.  You are sexually active with a partner who has HIV.  Talk with your health care provider about whether you are at high risk of  being infected with HIV. If you choose to begin PrEP, you should first be tested for HIV. You should then be tested every 3 months for as long as you are taking PrEP.  Use sunscreen. Apply sunscreen liberally and repeatedly throughout the day. You should seek shade when your shadow is shorter than you. Protect yourself by wearing long sleeves, pants, a wide-brimmed hat, and sunglasses year round whenever you are outdoors.  Tell your health care provider of new moles or changes in moles, especially if there is a change in shape or color. Also, tell your health care provider if a mole is larger than the size of a pencil eraser.  A one-time screening for abdominal aortic aneurysm (AAA) and surgical repair of large AAAs by ultrasound is recommended for men aged 39-75 years who are current or former smokers.  Stay current with your vaccines (immunizations). Document Released: 02/07/2008 Document Revised: 08/16/2013 Document Reviewed: 01/06/2011 [facility] Patient Information 2015 [place], [place]. This information is not intended to replace advice given to you by your health care provider. Make sure you discuss any questions you have with your health care provider.

## 2014-08-14 NOTE — Progress Notes (Signed)
Patient ID: Jeffery Reynolds, male   DOB: 1933-09-27, 80 y.o.   MRN: [id number]  The patient is here for annual Medicare wellness examination and management of other chronic and acute problems.    He is managing his chronic insomnia with trazodone and alprazolam.  He is frequently wakended by  The sensation of pins and needles in his right hand. And notes that the symptoms are prevented  By sleeping on his  back and gripping the edge of his blanket with both balled fists.  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    Using 2 vicodin daily for chronic knee pain,  But using it in the evening  makes him wired  so he takes is second dose by early afternoon  Chronic Constipation secondary to vicodin ,  Drinks plenty of water.  History of .tubular adenoma by colonoscopy done in be ub 2011 in  [place].  Sees Occasional blood in stools but only with hard large stools.    OSA:  He is Using CPAP  Every night averaging at least 6 hours   Exercise :  He Works out 3 day s per week with weights.  Has pain in left shoulder aggravated by bench pressing too much weight   Benched  225 lbs a few weeks ago and shoulder hurt for a month . History of rotator cuff repair  Remotely on the right shoulder.   Ophthalmology:  He sees Audiological scientist for dry eye,  On medication. Has bilateral extropion of lower lids.   No hearing issues'   no memory issues    The risk factors are reflected in the social history.  He was divorced in [place] his second wife after 29 yr marriage,  She was 25 yrs younger,  Still missing her despite her issues with OCD,   Left her behind in [place].  [place] closer  To his son [name] and his family of 3 children and wife   The roster of all physicians providing medical care to patient - is listed in the Snapshot section of the chart. Objective Activities of daily living:  The patient is 100%  independent in all ADLs: dressing, toileting, feeding as well as independent mobility  Home safety : The patient has smoke detectors in the home. They wear seatbelts.  There are no firearms at home. There is no violence in the home.   There is no risks for hepatitis, STDs or HIV. There is no   history of blood transfusion. They have no travel history to infectious disease endemic areas of the world.  The patient has seen their dentist in the last six month. They have seen their eye doctor in the last year. They admit to slight hearing difficulty with regard to whispered voices and some television programs.  They have deferred audiologic testing in the last year.  They do not  have excessive sun exposure. Discussed the need for sun protection: hats, long sleeves and use of sunscreen if there is significant sun exposure.   Diet: the importance of a healthy diet is discussed. They do have a healthy diet.  The benefits of regular aerobic exercise were discussed. She walks 4 times per week ,  20 minutes.   Depression screen: there are no signs or vegative symptoms of depression- irritability, change in appetite, anhedonia, sadness/tearfullness.  Cognitive assessment: the patient manages all  their financial and personal affairs and is actively engaged. They could relate day,date,year and events; recalled 2/3 objects at 3 minutes; performed clock-face test normally.  The following portions of the patient's history were reviewed and updated as appropriate: allergies, current medications, past family history, past medical history,  past surgical history, past social history  and problem list.  Visual acuity was not assessed per patient preference since she has regular follow up with her ophthalmologist. Hearing and body mass index were assessed and reviewed.   During the course of the visit the patient was educated and counseled about appropriate screening and preventive services including : fall  prevention , diabetes screening, nutrition counseling, colorectal cancer screening, and recommended immunizations.    Objective:  BP 120/78 mmHg  Pulse 69  Temp(Src) 98.4 F (36.9 C) (Oral)  Resp 16  Ht 5\' 7"  (1.702 m)  Wt 204 lb (92.534 kg)  BMI 31.94 kg/m2  SpO2 97%  General Appearance:    Alert, cooperative, no distress, appears stated age  Head:    Normocephalic, without obvious abnormality, atraumatic  Eyes:    PERRL, conjunctiva/corneas clear, EOM's intact, fundi    benign, both eyes       Ears:    Normal TM's and external ear canals, both ears  Nose:   Nares normal, septum midline, mucosa normal, no drainage   or sinus tenderness  Throat:   Lips, mucosa, and tongue normal; teeth and gums normal  Neck:   Supple, symmetrical, trachea midline, no adenopathy;       thyroid:  No enlargement/tenderness/nodules; no carotid   bruit or JVD  Back:     Symmetric, no curvature, ROM normal, no CVA tenderness  Lungs:     Clear to auscultation bilaterally, respirations unlabored  Chest wall:    No tenderness or deformity  Heart:    Regular rate and rhythm, S1 and S2 normal, no murmur, rub   or gallop  Abdomen:     Soft, non-tender, bowel sounds active all four quadrants,    no masses, no organomegaly     Pulses:   2+ and symmetric all extremities  Skin:   Skin color, texture, turgor normal, no rashes or lesions  Lymph nodes:   Cervical, supraclavicular, and axillary nodes normal  Neurologic:   CNII-XII intact. Normal strength, sensation and reflexes      throughout    Assessment and Plan:  Tubular adenoma of colon Found on 2011 colonoscopy done in [place].Dr. Lorna Few, [phone]  Will need referral for colonoscopy in 2016  Chronic pain of left knee Adding tramadol for evening use given patient's report of increased insomnia with use of vicodin in the evening,  Continue #60 vicodin monthly for bid use  Constipation Recommended an increase in  fiber intake to 25 to 35 grams daily.  Made several dietary suggestions of high fiber content including Mission low carb whole wheat tortillas which have 26 g fiber/serving, Toufayan flatbread which has around 10 g fiber,   Daily serving of any type of nuts,  and Atkins protein bars which have 8 to 10 g fiber.   Dispelled the popularly held notion that [employer] oatmeal, whole grain bread and most commericially made cereals have adequate fiber.  Finally I recommended daily use of Miralax., metamucil, citrucel, benefiber  or fibercon.   Medicare annual wellness visit, subsequent Annual Medicare wellness  exam was done as well as a comprehensive physical exam and management of  acute and chronic conditions .  During the course of the visit the patient was educated and counseled about appropriate screening and preventive services including : fall prevention , diabetes screening, nutrition counseling, colorectal cancer screening, and recommended immunizations.  Printed recommendations for health maintenance screenings was given.    Updated Medication List Outpatient Encounter Prescriptions as of 08/14/2014  Medication Sig  . allopurinol (ZYLOPRIM) 300 MG tablet Take on Monday, Wednesday and Friday.  . ALPRAZolam (XANAX) 0.25 MG tablet Take 1 tablet (0.25 mg total) by mouth at bedtime.  Marland Kitchen HYDROcodone-acetaminophen (NORCO/VICODIN) 5-325 MG per tablet Take 1 tablet twice a day as needed.  . metoprolol tartrate (LOPRESSOR) 25 MG tablet Take 0.5 tablets (12.5 mg total) by mouth 2 (two) times daily.  . Omeprazole Magnesium (PRILOSEC OTC PO) Take by mouth daily.  . sildenafil (VIAGRA) 100 MG tablet Take 100 mg by mouth daily as needed for erectile dysfunction.  . simvastatin (ZOCOR) 80 MG tablet Take 1 tablet (80 mg total) by mouth daily.  . sotalol (BETAPACE) 80 MG tablet Take 1 tablet (80 mg total) by mouth 2 (two) times daily.  . traZODone (DESYREL) 150 MG tablet Take 1 tablet (150 mg total) by mouth at  bedtime.  Marland Kitchen warfarin (COUMADIN) 5 MG tablet Take as directed by anticoagulation clinic  . zoster vaccine live, PF, (ZOSTAVAX) 33832 UNT/0.65ML injection Inject 19,400 Units into the skin once.  . [DISCONTINUED] ALPRAZolam (XANAX) 0.25 MG tablet Take 0.25 mg by mouth at bedtime.  . traMADol (ULTRAM) 50 MG tablet Take 1 tablet (50 mg total) by mouth daily after supper.

## 2014-08-15 DIAGNOSIS — K5900 Constipation, unspecified: Secondary | ICD-10-CM | POA: Insufficient documentation

## 2014-08-15 DIAGNOSIS — Z0000 Encounter for general adult medical examination without abnormal findings: Secondary | ICD-10-CM | POA: Insufficient documentation

## 2014-08-15 LAB — HEPATITIS C ANTIBODY: HCV Ab: NEGATIVE

## 2014-08-15 NOTE — Assessment & Plan Note (Signed)
Adding tramadol for evening use given patient's report of increased insomnia with use of vicodin in the evening,  Continue #60 vicodin monthly for bid use

## 2014-08-15 NOTE — Assessment & Plan Note (Signed)
Recommended an increase in fiber intake to 25 to 35 grams daily.  Made several dietary suggestions of high fiber content including Mission low carb whole wheat tortillas which have 26 g fiber/serving, Toufayan flatbread which has around 10 g fiber,   Daily serving of any type of nuts,  and Atkins protein bars which have 8 to 10 g fiber.   Dispelled the popularly held notion that Quaker oats oatmeal, whole grain bread and most commericially made cereals have adequate fiber.  Finally I recommended daily use of Miralax., metamucil, citrucel, benefiber  or fibercon.  

## 2014-08-15 NOTE — Assessment & Plan Note (Signed)

## 2014-08-15 NOTE — Assessment & Plan Note (Signed)
Found on 2011 colonoscopy done in [place].Dr. Lorna Few, [place]  Will need referral for colonoscopy in 2016

## 2014-08-16 LAB — TSH: TSH: 1.7 u[IU]/mL (ref 0.35–4.50)

## 2014-08-16 LAB — VITAMIN D 25 HYDROXY (VIT D DEFICIENCY, FRACTURES): VITD: 28.32 ng/mL — ABNORMAL LOW (ref 30.00–100.00)

## 2014-08-17 DIAGNOSIS — D696 Thrombocytopenia, unspecified: Secondary | ICD-10-CM | POA: Insufficient documentation

## 2014-08-17 NOTE — Addendum Note (Signed)
Addended by: Crecencio Mc on: 08/17/2014 06:57 AM   Modules accepted: Orders

## 2014-08-23 ENCOUNTER — Ambulatory Visit (INDEPENDENT_AMBULATORY_CARE_PROVIDER_SITE_OTHER): Payer: Medicare Other

## 2014-08-23 ENCOUNTER — Other Ambulatory Visit (INDEPENDENT_AMBULATORY_CARE_PROVIDER_SITE_OTHER): Payer: Medicare Other

## 2014-08-23 DIAGNOSIS — I481 Persistent atrial fibrillation: Secondary | ICD-10-CM | POA: Diagnosis not present

## 2014-08-23 DIAGNOSIS — D696 Thrombocytopenia, unspecified: Secondary | ICD-10-CM

## 2014-08-23 DIAGNOSIS — Z5181 Encounter for therapeutic drug level monitoring: Secondary | ICD-10-CM | POA: Diagnosis not present

## 2014-08-23 DIAGNOSIS — I4819 Other persistent atrial fibrillation: Secondary | ICD-10-CM

## 2014-08-23 LAB — CBC WITH DIFFERENTIAL/PLATELET
Basophils Absolute: 0 10*3/uL (ref 0.0–0.1)
Basophils Relative: 0.3 % (ref 0.0–3.0)
Eosinophils Absolute: 0.1 10*3/uL (ref 0.0–0.7)
Eosinophils Relative: 2.9 % (ref 0.0–5.0)
HCT: 41.3 % (ref 39.0–52.0)
Hemoglobin: 13.9 g/dL (ref 13.0–17.0)
Lymphocytes Relative: 24.4 % (ref 12.0–46.0)
Lymphs Abs: 0.9 10*3/uL (ref 0.7–4.0)
MCHC: 33.8 g/dL (ref 30.0–36.0)
MCV: 97.1 fl (ref 78.0–100.0)
Monocytes Absolute: 0.4 10*3/uL (ref 0.1–1.0)
Monocytes Relative: 11.6 % (ref 3.0–12.0)
Neutro Abs: 2.3 10*3/uL (ref 1.4–7.7)
Neutrophils Relative %: 60.8 % (ref 43.0–77.0)
Platelets: 86 10*3/uL — ABNORMAL LOW (ref 150.0–400.0)
RBC: 4.25 Mil/uL (ref 4.22–5.81)
RDW: 13.1 % (ref 11.5–15.5)
WBC: 3.9 10*3/uL — ABNORMAL LOW (ref 4.0–10.5)

## 2014-08-23 LAB — POCT INR: INR: 2.9

## 2014-08-24 LAB — LACTATE DEHYDROGENASE: LDH: 185 U/L (ref 94–250)

## 2014-09-08 ENCOUNTER — Ambulatory Visit: Payer: Medicare Other | Admitting: Nurse Practitioner

## 2014-09-11 ENCOUNTER — Ambulatory Visit: Payer: Medicare Other | Admitting: Cardiovascular Disease

## 2014-09-14 ENCOUNTER — Encounter: Payer: Self-pay | Admitting: Cardiovascular Disease

## 2014-09-14 ENCOUNTER — Ambulatory Visit: Payer: Medicare Other | Admitting: Nurse Practitioner

## 2014-09-14 ENCOUNTER — Ambulatory Visit (INDEPENDENT_AMBULATORY_CARE_PROVIDER_SITE_OTHER): Payer: Medicare Other | Admitting: Internal Medicine

## 2014-09-14 ENCOUNTER — Encounter: Payer: Self-pay | Admitting: Internal Medicine

## 2014-09-14 ENCOUNTER — Ambulatory Visit (INDEPENDENT_AMBULATORY_CARE_PROVIDER_SITE_OTHER): Payer: Medicare Other | Admitting: Cardiovascular Disease

## 2014-09-14 ENCOUNTER — Encounter: Payer: Self-pay | Admitting: *Deleted

## 2014-09-14 VITALS — BP 106/70 | HR 73 | Ht 67.0 in | Wt 207.2 lb

## 2014-09-14 VITALS — BP 105/68 | HR 74 | Temp 97.6°F | Ht 67.0 in | Wt 207.5 lb

## 2014-09-14 DIAGNOSIS — M1990 Unspecified osteoarthritis, unspecified site: Secondary | ICD-10-CM

## 2014-09-14 DIAGNOSIS — I481 Persistent atrial fibrillation: Secondary | ICD-10-CM

## 2014-09-14 DIAGNOSIS — G4733 Obstructive sleep apnea (adult) (pediatric): Secondary | ICD-10-CM | POA: Diagnosis not present

## 2014-09-14 DIAGNOSIS — E785 Hyperlipidemia, unspecified: Secondary | ICD-10-CM | POA: Diagnosis not present

## 2014-09-14 DIAGNOSIS — I10 Essential (primary) hypertension: Secondary | ICD-10-CM | POA: Diagnosis not present

## 2014-09-14 DIAGNOSIS — I2510 Atherosclerotic heart disease of native coronary artery without angina pectoris: Secondary | ICD-10-CM

## 2014-09-14 DIAGNOSIS — Z79891 Long term (current) use of opiate analgesic: Secondary | ICD-10-CM | POA: Diagnosis not present

## 2014-09-14 DIAGNOSIS — Z79899 Other long term (current) drug therapy: Secondary | ICD-10-CM | POA: Diagnosis not present

## 2014-09-14 DIAGNOSIS — I4819 Other persistent atrial fibrillation: Secondary | ICD-10-CM

## 2014-09-14 MED ORDER — HYDROCODONE-ACETAMINOPHEN 5-325 MG PO TABS: ORAL_TABLET | ORAL | Status: DC

## 2014-09-14 NOTE — Assessment & Plan Note (Signed)
He has no symptoms of angina. Continue medical therapy. 

## 2014-09-14 NOTE — Assessment & Plan Note (Signed)
Will continue CPAP. New Rx sent for supplies.

## 2014-09-14 NOTE — Assessment & Plan Note (Signed)
Lab Results  Component Value Date   CHOL 141 08/14/2014   HDL 36.80* 08/14/2014   LDLCALC 73 08/14/2014   TRIG 154.0* 08/14/2014   CHOLHDL 4 08/14/2014   Continue treatment with simvastatin.

## 2014-09-14 NOTE — Patient Instructions (Signed)
We will order CPAP supplies.  Follow up in 3 months and sooner as needed.

## 2014-09-14 NOTE — Progress Notes (Signed)
HPI  This is a pleasant 81 year old man who is here today for a followup visit.  He has prolonged cardiac history. He underwent 6 vessel CABG in 1995 in [place]. He was diagnosed later with atrial fibrillation and was started on anticoagulation with warfarin. He has been maintaining in sinus rhythm with sotalol. He reports no recurrent ischemic events since his CABG. He has known history of hypertension, hyperlipidemia and previous tobacco use. There is family history of coronary artery disease. He is retired from Scientist, research (medical).  During last visit, he had increased dizziness. Thus, we decreased the dose of metoprolol to 12.5 mg twice daily especially that he was on sotalol. His heart rate then was 58 bpm. He reports improvement in symptoms. He denies chest pain or shortness of breath. He is noted to be in atrial fibrillation today but he reports no symptoms.  No Known Allergies   Current Outpatient Prescriptions on File Prior to Visit  Medication Sig Dispense Refill  . allopurinol (ZYLOPRIM) 300 MG tablet Take on Monday, Wednesday and Friday. 30 tablet 6  . ALPRAZolam (XANAX) 0.25 MG tablet Take 1 tablet (0.25 mg total) by mouth at bedtime. 30 tablet 5  . HYDROcodone-acetaminophen (NORCO/VICODIN) 5-325 MG per tablet Take 1 tablet twice a day as needed. 60 tablet 0  . metoprolol tartrate (LOPRESSOR) 25 MG tablet Take 0.5 tablets (12.5 mg total) by mouth 2 (two) times daily. 30 tablet 6  . Omeprazole Magnesium (PRILOSEC OTC PO) Take by mouth daily.    . sildenafil (VIAGRA) 100 MG tablet Take 100 mg by mouth daily as needed for erectile dysfunction.    . simvastatin (ZOCOR) 80 MG tablet Take 1 tablet (80 mg total) by mouth daily. 30 tablet 3  . sotalol (BETAPACE) 80 MG tablet Take 1 tablet (80 mg total) by mouth 2 (two) times daily. 60 tablet 3  . traMADol (ULTRAM) 50 MG tablet Take 1 tablet (50 mg total) by mouth daily after supper. 30 tablet 2  . traZODone (DESYREL) 150 MG tablet Take 1 tablet (150 mg  total) by mouth at bedtime. 30 tablet 6  . warfarin (COUMADIN) 5 MG tablet Take as directed by anticoagulation clinic 30 tablet 1  . zoster vaccine live, PF, (ZOSTAVAX) 40981 UNT/0.65ML injection Inject 19,400 Units into the skin once. 1 each 0   No current facility-administered medications on file prior to visit.     Past Medical History  Diagnosis Date  . A-fib 2000       . Gout     Controlled with Allopurinol  . Hyperlipidemia   . Hypertension   . Arthritis   . History of chicken pox   . GERD (gastroesophageal reflux disease)   . Genital warts   . Seasonal allergies   . Colon polyps   . History of blood transfusion   . CAD (coronary artery disease)   . Thrombocytopathia   . Sleep apnea, obstructive      Past Surgical History  Procedure Laterality Date  . Coronary artery bypass graft  1995    CABG x 6 in [place], [place]  . Knee surgery      bilateral   . Rotator cuff repair      right  . Gallbladder surgery    . Cardiac catheterization    . Tonsillectomy and adenoidectomy  1943  . Gynecomastia excision       Family History  Problem Relation Age of Onset  . Heart disease Father   . Heart attack Father  65    MI  . Alcohol abuse Father   . Hyperlipidemia Father   . Hypertension Father   . Heart attack Paternal Grandmother 80  . Heart attack Paternal Grandfather 41  . Cancer Mother     Breast cancer and Lung Cancer  . Diabetes Brother   . Crohn's disease Daughter   . Cancer Brother     lung cancer  . Alcohol abuse Son      History   Social History  . Marital Status: Divorced    Spouse Name: N/A    Number of Children: 7  . Years of Education: 12   Occupational History  . 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 Smoking status: Never Smoker   . Smokeless tobacco: Not on file  . Alcohol Use: Yes     Comment: occasional   . Drug Use: No  . Sexual Activity: Not on file   Other Topics Concern  . Not on  file   Social History Narrative   Mr. Jeffery Reynolds grew up in [place], [place]. Most recently he was living in [place]. He moved to the [place] area since March 2015. Recently divorced and moved to this area to be close to one of his sons. He enjoys reading and has been going to the gym to exercise.              ROS A 10 point review of system was performed. It is negative other than that mentioned in the history of present illness.   PHYSICAL EXAM   BP 106/70 mmHg  Pulse 73  Ht 5\' 7"  (1.702 m)  Wt 207 lb 4 oz (94.008 kg)  BMI 32.45 kg/m2 Constitutional: He is oriented to person, place, and time. He appears well-developed and well-nourished. No distress.  HENT: No nasal discharge.  Head: Normocephalic and atraumatic.  Eyes: Pupils are equal and round.  No discharge. Neck: Normal range of motion. Neck supple. No JVD present. No thyromegaly present.  Cardiovascular: Normal rate, regular rhythm, normal heart sounds. Exam reveals no gallop and no friction rub. No murmur heard.  Pulmonary/Chest: Effort normal and breath sounds normal. No stridor. No respiratory distress. He has no wheezes. He has no rales. He exhibits no tenderness.  Abdominal: Soft. Bowel sounds are normal. He exhibits no distension. There is no tenderness. There is no rebound and no guarding.  Musculoskeletal: Normal range of motion. He exhibits no edema and no tenderness.  Neurological: He is alert and oriented to person, place, and time. Coordination normal.  Skin: Skin is warm and dry. No rash noted. He is not diaphoretic. No erythema. No pallor.  Psychiatric: He has a normal mood and affect. His behavior is normal. Judgment and thought content normal.     CBJ:SEGBTD flutter-fibrillation  -Prominent R(V1) -nonspecific.   ABNORMAL RHYTHM   ASSESSMENT AND PLAN

## 2014-09-14 NOTE — Assessment & Plan Note (Signed)
He is noted to be in atrial fibrillation today with ventricular rate is controlled and he is asymptomatic. He reports occasional episodes of atrial fibrillation but usually he maintained in sinus rhythm. Thus, I made no changes in his medications today. If atrial fibrillation becomes chronic, I will discontinue sotalol and continue rate control with metoprolol.

## 2014-09-14 NOTE — Progress Notes (Signed)
Subjective:    Patient ID: Jeffery Reynolds, male    DOB: 1934-07-28, 81 y.o.   MRN: [id number]  HPI 81YO male presents for acute visit.  OSA - needs CPAP supplies. Wears CPAP every night. No issues with equipment. Sleeps much better with CPAP  Chronic pain in left knee and lower back.  Aching pain in left knee after 2 knee replacements. Takes two tablets of Hydrocodone daily since 2008 for chronic pain. When pain is well controlled, will limit to one tablet. Unable to take NSAIDS because of CAD. Pain symptoms well controlled with Hydrocodone. No side effects noted from medication.  Past medical, surgical, family and social history per today's encounter.  Review of Systems  Constitutional: Negative for fever, chills, activity change, appetite change, fatigue and unexpected weight change.  Eyes: Negative for visual disturbance.  Respiratory: Negative for cough and shortness of breath.   Cardiovascular: Negative for chest pain, palpitations and leg swelling.  Gastrointestinal: Negative for abdominal pain and abdominal distention.  Genitourinary: Negative for dysuria, urgency and difficulty urinating.  Musculoskeletal: Positive for myalgias, back pain and arthralgias. Negative for gait problem.  Skin: Negative for color change and rash.  Hematological: Negative for adenopathy.  Psychiatric/Behavioral: Negative for sleep disturbance and dysphoric mood. The patient is not nervous/anxious.        Objective:    BP 105/68 mmHg  Pulse 74  Temp(Src) 97.6 F (36.4 C) (Oral)  Ht 5\' 7"  (1.702 m)  Wt 207 lb 8 oz (94.121 kg)  BMI 32.49 kg/m2  SpO2 98% Physical Exam  Constitutional: He is oriented to person, place, and time. He appears well-developed and well-nourished. No distress.  HENT:  Head: Normocephalic and atraumatic.  Right Ear: External ear normal.  Left Ear: External ear normal.  Nose: Nose normal.  Mouth/Throat: Oropharynx is clear and moist. No oropharyngeal exudate.  Eyes:  Conjunctivae and EOM are normal. Pupils are equal, round, and reactive to light. Right eye exhibits no discharge. Left eye exhibits no discharge. No scleral icterus.  Neck: Normal range of motion. Neck supple. No tracheal deviation present. No thyromegaly present.  Cardiovascular: Normal rate, regular rhythm and normal heart sounds.  Exam reveals no gallop and no friction rub.   No murmur heard. Pulmonary/Chest: Effort normal and breath sounds normal. No accessory muscle usage. No tachypnea. No respiratory distress. He has no decreased breath sounds. He has no wheezes. He has no rhonchi. He has no rales. He exhibits no tenderness.  Musculoskeletal: He exhibits no edema.       Left knee: He exhibits decreased range of motion.       Lumbar back: He exhibits pain. He exhibits normal range of motion and no tenderness.  Lymphadenopathy:    He has no cervical adenopathy.  Neurological: He is alert and oriented to person, place, and time. No cranial nerve deficit. Coordination normal.  Skin: Skin is warm and dry. No rash noted. He is not diaphoretic. No erythema. No pallor.  Psychiatric: He has a normal mood and affect. His behavior is normal. Judgment and thought content normal.          Assessment & Plan:   Problem List Items Addressed This Visit      Unprioritized   Arthritis    Chronic severe left knee pain after TKR x2 and chronic low back pain. Symptoms well controlled with daily Hydrocodone since 2008. Discussed risks of using narcotic medication for pain control. Discussed taking a day off of medication and limiting dose  when possible. He is unable to use NSAIDS because of CAD. Other medication has been ineffective. Will continue Hydrocodone. Controlled substance contract signed today. UDS pending.      Relevant Medications   HYDROcodone-acetaminophen (NORCO/VICODIN) 5-325 MG per tablet   Obstructive sleep apnea - Primary    Will continue CPAP. New Rx sent for supplies.           Return in about 3 months (around 12/14/2014) for Recheck.

## 2014-09-14 NOTE — Assessment & Plan Note (Signed)
Chronic severe left knee pain after TKR x2 and chronic low back pain. Symptoms well controlled with daily Hydrocodone since 2008. Discussed risks of using narcotic medication for pain control. Discussed taking a day off of medication and limiting dose when possible. He is unable to use NSAIDS because of CAD. Other medication has been ineffective. Will continue Hydrocodone. Controlled substance contract signed today. UDS pending.

## 2014-09-14 NOTE — Progress Notes (Signed)
Pre visit review using our clinic review tool, if applicable. No additional management support is needed unless otherwise documented below in the visit note. 

## 2014-09-14 NOTE — Assessment & Plan Note (Signed)
Blood pressure is well controlled on current medications. Dizziness improved significantly after decreasing the dose of metoprolol.

## 2014-09-14 NOTE — Patient Instructions (Signed)
Continue same medications.   Your physician wants you to follow-up in: 6 months.  You will receive a reminder letter in the mail two months in advance. If you don't receive a letter, please call our office to schedule the follow-up appointment.  

## 2014-09-20 DIAGNOSIS — H16223 Keratoconjunctivitis sicca, not specified as Sjogren's, bilateral: Secondary | ICD-10-CM | POA: Diagnosis not present

## 2014-09-21 ENCOUNTER — Telehealth: Payer: Self-pay | Admitting: *Deleted

## 2014-09-21 NOTE — Telephone Encounter (Signed)
Spoke with Jeneen Montgomery at [facility], [phone] about Pt's sleep study supplies.  Asked Waunita Schooner to send pt's sleep study results to [place], faxed medical release to him. Waunita Schooner stated that the pt needs to call the supply center and place his order at [phone].  Advised pt.

## 2014-10-11 ENCOUNTER — Ambulatory Visit (INDEPENDENT_AMBULATORY_CARE_PROVIDER_SITE_OTHER): Payer: Medicare Other

## 2014-10-11 DIAGNOSIS — I4819 Other persistent atrial fibrillation: Secondary | ICD-10-CM

## 2014-10-11 DIAGNOSIS — Z5181 Encounter for therapeutic drug level monitoring: Secondary | ICD-10-CM

## 2014-10-11 DIAGNOSIS — I481 Persistent atrial fibrillation: Secondary | ICD-10-CM

## 2014-10-11 LAB — POCT INR: INR: 2.8

## 2014-10-14 ENCOUNTER — Other Ambulatory Visit: Payer: Self-pay | Admitting: Cardiovascular Disease

## 2014-10-31 ENCOUNTER — Other Ambulatory Visit: Payer: Self-pay | Admitting: Nurse Practitioner

## 2014-10-31 ENCOUNTER — Telehealth: Payer: Self-pay | Admitting: Nurse Practitioner

## 2014-10-31 MED ORDER — HYDROCODONE-ACETAMINOPHEN 5-325 MG PO TABS: ORAL_TABLET | ORAL | Status: DC

## 2014-10-31 NOTE — Telephone Encounter (Signed)
Pt aware Rx ready for pick up 

## 2014-10-31 NOTE — Telephone Encounter (Signed)
Pt needs rx refill on hydrocodone. Please advise pt when ready/msn

## 2014-10-31 NOTE — Telephone Encounter (Signed)
Okay to fill. Ready.

## 2014-10-31 NOTE — Telephone Encounter (Signed)
See not below. 

## 2014-11-03 ENCOUNTER — Ambulatory Visit (INDEPENDENT_AMBULATORY_CARE_PROVIDER_SITE_OTHER): Payer: Medicare Other | Admitting: Nurse Practitioner

## 2014-11-03 ENCOUNTER — Ambulatory Visit: Payer: Self-pay | Admitting: Nurse Practitioner

## 2014-11-03 ENCOUNTER — Encounter: Payer: Self-pay | Admitting: Nurse Practitioner

## 2014-11-03 VITALS — BP 118/70 | HR 63 | Temp 98.1°F | Resp 14 | Ht 67.0 in | Wt 200.0 lb

## 2014-11-03 DIAGNOSIS — M25512 Pain in left shoulder: Secondary | ICD-10-CM

## 2014-11-03 DIAGNOSIS — M13812 Other specified arthritis, left shoulder: Secondary | ICD-10-CM | POA: Diagnosis not present

## 2014-11-03 DIAGNOSIS — M19012 Primary osteoarthritis, left shoulder: Secondary | ICD-10-CM | POA: Diagnosis not present

## 2014-11-03 DIAGNOSIS — I2510 Atherosclerotic heart disease of native coronary artery without angina pectoris: Secondary | ICD-10-CM

## 2014-11-03 NOTE — Patient Instructions (Addendum)
Medical mall entrance with the statue  Patient Registration Sat 9-4 pm   We will follow up after results (with a call)

## 2014-11-03 NOTE — Assessment & Plan Note (Signed)
Improving, but not resolved. AC joint pain with ROM and palpation. Will obtain left shoulder x-ray at [facility]. Will follow up Monday on results.

## 2014-11-03 NOTE — Progress Notes (Signed)
Pre visit review using our clinic review tool, if applicable. No additional management support is needed unless otherwise documented below in the visit note. 

## 2014-11-03 NOTE — Progress Notes (Signed)
   Subjective:    Patient ID: Wallace Cullens, male    DOB: [birth year], 81 y.o.   MRN: [id number]  HPI  Mr. Cavins is a 81 yo male with a CC of left shoulder pain x 3 months.   1) Bench pressing, next morning woke up and sore   Better, but not fully improved. Rested for 2-3 weeks after it occured, few times tried icing, hydrocodone- not helpful.   Review of Systems  Constitutional: Negative for fever, chills, diaphoresis, activity change and fatigue.  Eyes: Negative for visual disturbance.  Gastrointestinal: Negative for nausea, vomiting, diarrhea and rectal pain.  Genitourinary: Negative for dysuria and difficulty urinating.  Musculoskeletal: Positive for arthralgias. Negative for myalgias, back pain, joint swelling, gait problem, neck pain and neck stiffness.       Left shoulder anterior/superior  Skin: Negative for rash.  Neurological: Negative for dizziness, weakness and numbness.      Objective:   Physical Exam  Constitutional: He is oriented to person, place, and time. He appears well-developed and well-nourished. No distress.  BP 118/70 mmHg  Pulse 63  Temp(Src) 98.1 F (36.7 C) (Oral)  Resp 14  Ht 5\' 7"  (1.702 m)  Wt 200 lb (90.719 kg)  BMI 31.32 kg/m2  SpO2 96%   HENT:  Head: Normocephalic and atraumatic.  Right Ear: External ear normal.  Left Ear: External ear normal.  Cardiovascular: Normal rate, regular rhythm, normal heart sounds and intact distal pulses.  Exam reveals no gallop and no friction rub.   No murmur heard. Pulmonary/Chest: Effort normal and breath sounds normal. No respiratory distress. He has no wheezes. He has no rales. He exhibits no tenderness.  Musculoskeletal: Normal range of motion. He exhibits tenderness. He exhibits no edema.  Full ROM of left shoulder, pain around [facility] joint, slightly tender to palpation  Neurological: He is alert and oriented to person, place, and time.  Skin: Skin is warm and dry. No rash noted. He is not diaphoretic.    Psychiatric: He has a normal mood and affect. His behavior is normal. Judgment and thought content normal.      Assessment & Plan:

## 2014-11-07 ENCOUNTER — Other Ambulatory Visit: Payer: Self-pay | Admitting: Nurse Practitioner

## 2014-11-07 ENCOUNTER — Telehealth: Payer: Self-pay | Admitting: Nurse Practitioner

## 2014-11-07 DIAGNOSIS — M25512 Pain in left shoulder: Secondary | ICD-10-CM

## 2014-11-07 NOTE — Telephone Encounter (Signed)
Patient stated he had an X-ray performed on 11/06/14 awaiting results and was hoping to get something for the pain in his shoulder.

## 2014-11-07 NOTE — Telephone Encounter (Signed)
Notified patient x2.  Patient again verbalized understanding and will start the Ibuprofen today and continue to use until referral appointment is made.

## 2014-11-07 NOTE — Telephone Encounter (Signed)
Referral in. Injections were agreeable yesterday. Ice, ibuprofen, 2 joints in shoulder are mildly arthritic.

## 2014-11-14 ENCOUNTER — Telehealth: Payer: Self-pay | Admitting: Nurse Practitioner

## 2014-11-14 NOTE — Telephone Encounter (Signed)
Patient would like to know what to do, he need clarification of the telephone message, please advise

## 2014-11-14 NOTE — Telephone Encounter (Signed)
Patient questions answered, has appt already scheduled with [facility] for follow up shoulder pain.

## 2014-11-16 ENCOUNTER — Encounter: Payer: Self-pay | Admitting: Nurse Practitioner

## 2014-11-16 ENCOUNTER — Ambulatory Visit (INDEPENDENT_AMBULATORY_CARE_PROVIDER_SITE_OTHER): Payer: Medicare Other | Admitting: Nurse Practitioner

## 2014-11-16 VITALS — BP 110/68 | HR 67 | Resp 14 | Ht 67.0 in | Wt 198.8 lb

## 2014-11-16 DIAGNOSIS — G4733 Obstructive sleep apnea (adult) (pediatric): Secondary | ICD-10-CM | POA: Diagnosis not present

## 2014-11-16 DIAGNOSIS — M25512 Pain in left shoulder: Secondary | ICD-10-CM | POA: Diagnosis not present

## 2014-11-16 DIAGNOSIS — I2510 Atherosclerotic heart disease of native coronary artery without angina pectoris: Secondary | ICD-10-CM

## 2014-11-16 NOTE — Patient Instructions (Signed)
April 14th appointment with Ortho

## 2014-11-16 NOTE — Assessment & Plan Note (Signed)
Unsure if still using CPAP, but sleep disturbance x 3 nights. On Trazadone 150 mg at night and xanax 0.25 mg at night. Asked if at next refill we can up trazadone to 1.5 tablets as needed for sleeping disturbances. Will follow.

## 2014-11-16 NOTE — Progress Notes (Signed)
   Subjective:    Patient ID: Jeffery Reynolds, male    DOB: 1933-11-09, 81 y.o.   MRN: [id number]  HPI  Mr. Eden is a 81 yo male with a CC of left shoulder pain follow up and discussing sleep disturbance x 3 days.  1) Left shoulder pain- Pain is unchanged. Feels better lifting lighter weights with more repetitions. Still interested in getting an injection and scheduled for April 14th. He experiences no pain with rest.   2) Sleep disturbance- Going to bed around 10 pm, last three nights waking up around 1:30 and going back to bed around 4 am. Gets up and sits in his recliner until sleeping again.   Review of Systems  Constitutional: Negative for fever, chills, diaphoresis and fatigue.  Eyes: Negative for visual disturbance.  Respiratory: Negative for chest tightness, shortness of breath and wheezing.   Cardiovascular: Negative for chest pain, palpitations and leg swelling.  Gastrointestinal: Negative for nausea, vomiting and diarrhea.  Musculoskeletal: Positive for arthralgias.  Skin: Negative for rash.  Neurological: Negative for dizziness, weakness and numbness.  Psychiatric/Behavioral: The patient is not nervous/anxious.       Objective:   Physical Exam  Constitutional: He is oriented to person, place, and time. He appears well-developed and well-nourished. No distress.  BP 110/68 mmHg  Pulse 67  Resp 14  Ht 5\' 7"  (1.702 m)  Wt 198 lb 12 oz (90.152 kg)  BMI 31.12 kg/m2  SpO2 98%   HENT:  Head: Normocephalic and atraumatic.  Right Ear: External ear normal.  Left Ear: External ear normal.  Cardiovascular: Normal rate, regular rhythm and normal heart sounds.  Exam reveals no gallop and no friction rub.   No murmur heard. Pulmonary/Chest: Effort normal and breath sounds normal. No respiratory distress. He has no wheezes. He has no rales. He exhibits no tenderness.  Neurological: He is alert and oriented to person, place, and time.  Skin: Skin is warm and dry. No rash noted. He  is not diaphoretic.  Psychiatric: He has a normal mood and affect. His behavior is normal. Judgment and thought content normal.      Assessment & Plan:

## 2014-11-16 NOTE — Assessment & Plan Note (Signed)
Stable. Will follow up with Ortho on April 14th for possible injection. No further complaints today.

## 2014-11-16 NOTE — Progress Notes (Signed)
Pre visit review using our clinic review tool, if applicable. No additional management support is needed unless otherwise documented below in the visit note. 

## 2014-11-20 ENCOUNTER — Other Ambulatory Visit: Payer: Self-pay | Admitting: Internal Medicine

## 2014-11-20 NOTE — Telephone Encounter (Signed)
OK to fill

## 2014-11-22 ENCOUNTER — Ambulatory Visit (INDEPENDENT_AMBULATORY_CARE_PROVIDER_SITE_OTHER): Payer: Medicare Other

## 2014-11-22 DIAGNOSIS — I481 Persistent atrial fibrillation: Secondary | ICD-10-CM | POA: Diagnosis not present

## 2014-11-22 DIAGNOSIS — Z5181 Encounter for therapeutic drug level monitoring: Secondary | ICD-10-CM

## 2014-11-22 DIAGNOSIS — I4819 Other persistent atrial fibrillation: Secondary | ICD-10-CM

## 2014-11-22 LAB — POCT INR: INR: 2.2

## 2014-12-04 ENCOUNTER — Encounter: Payer: Self-pay | Admitting: Nurse Practitioner

## 2014-12-07 DIAGNOSIS — M7522 Bicipital tendinitis, left shoulder: Secondary | ICD-10-CM | POA: Diagnosis not present

## 2014-12-07 DIAGNOSIS — M19012 Primary osteoarthritis, left shoulder: Secondary | ICD-10-CM | POA: Diagnosis not present

## 2014-12-14 ENCOUNTER — Ambulatory Visit (INDEPENDENT_AMBULATORY_CARE_PROVIDER_SITE_OTHER): Payer: Medicare Other

## 2014-12-14 DIAGNOSIS — I481 Persistent atrial fibrillation: Secondary | ICD-10-CM

## 2014-12-14 DIAGNOSIS — Z5181 Encounter for therapeutic drug level monitoring: Secondary | ICD-10-CM

## 2014-12-14 DIAGNOSIS — I4819 Other persistent atrial fibrillation: Secondary | ICD-10-CM

## 2014-12-14 LAB — POCT INR: INR: 2.8

## 2014-12-20 ENCOUNTER — Encounter: Payer: Self-pay | Admitting: Nurse Practitioner

## 2014-12-20 ENCOUNTER — Other Ambulatory Visit: Payer: Self-pay

## 2014-12-20 ENCOUNTER — Other Ambulatory Visit: Payer: Self-pay | Admitting: Nurse Practitioner

## 2014-12-20 ENCOUNTER — Other Ambulatory Visit: Payer: Self-pay | Admitting: Internal Medicine

## 2014-12-20 MED ORDER — HYDROCODONE-ACETAMINOPHEN 5-325 MG PO TABS: ORAL_TABLET | ORAL | Status: DC

## 2014-12-20 NOTE — Telephone Encounter (Signed)
Patient walked in to get a refill on hydrocodone.  Spoke with Morey Hummingbird regarding the refill.  Patient will come back to pick up.

## 2014-12-20 NOTE — Telephone Encounter (Signed)
UDS and CSC done in Jan. 2016, compliant via the [employer], will refill for 1 month. Placed up front for pick up.

## 2014-12-20 NOTE — Telephone Encounter (Signed)
Last visit 11/16/14, ok refill? Last refill 11/20/14 #30

## 2014-12-21 NOTE — Telephone Encounter (Signed)
Pt does use both, he takes 1 Tramadol at night, after dinner. He takes 1 hydrocodone in the mornings.

## 2014-12-21 NOTE — Telephone Encounter (Signed)
Faxed to pharmacy

## 2014-12-21 NOTE — Telephone Encounter (Signed)
Can you ask him if he needs hydrocodone AND tramadol? Or how he is taking them. Thanks!

## 2015-01-04 ENCOUNTER — Other Ambulatory Visit: Payer: Self-pay | Admitting: *Deleted

## 2015-01-04 MED ORDER — WARFARIN SODIUM 5 MG PO TABS: ORAL_TABLET | ORAL | Status: DC

## 2015-01-04 MED ORDER — SIMVASTATIN 80 MG PO TABS: 80.0000 mg | ORAL_TABLET | Freq: Every day | ORAL | Status: DC

## 2015-01-04 NOTE — Telephone Encounter (Signed)
Pt requesting refill on Coumadin. Please see note below.

## 2015-01-04 NOTE — Telephone Encounter (Signed)
Patient is out of town and needs Zocor refilled at [facility] T# [phone] fax [phone]

## 2015-01-04 NOTE — Telephone Encounter (Signed)
Rx sent to pharmacy in [place] as requested.

## 2015-01-04 NOTE — Telephone Encounter (Signed)
°  1. Which medications need to be refilled? coumadin  2. Which pharmacy is medication to be sent to? Pt is Jeffery Reynolds, and has misplaced his medication, he needs [place] to send it to  [facility] [address] [address] [phone]  3. Do they need a 30 day or 90 day supply? 30  4. Would they like a call back once the medication has been sent to the pharmacy? no

## 2015-01-19 ENCOUNTER — Other Ambulatory Visit: Payer: Self-pay | Admitting: Cardiovascular Disease

## 2015-01-27 ENCOUNTER — Other Ambulatory Visit: Payer: Self-pay | Admitting: Cardiovascular Disease

## 2015-01-29 ENCOUNTER — Telehealth: Payer: Self-pay | Admitting: *Deleted

## 2015-01-29 ENCOUNTER — Other Ambulatory Visit: Payer: Self-pay | Admitting: *Deleted

## 2015-01-29 NOTE — Telephone Encounter (Signed)
Error

## 2015-01-29 NOTE — Telephone Encounter (Signed)
Please review for refill. Thanks!  

## 2015-01-31 ENCOUNTER — Ambulatory Visit (INDEPENDENT_AMBULATORY_CARE_PROVIDER_SITE_OTHER): Payer: Medicare Other

## 2015-01-31 DIAGNOSIS — I4819 Other persistent atrial fibrillation: Secondary | ICD-10-CM

## 2015-01-31 DIAGNOSIS — I481 Persistent atrial fibrillation: Secondary | ICD-10-CM

## 2015-01-31 DIAGNOSIS — Z5181 Encounter for therapeutic drug level monitoring: Secondary | ICD-10-CM | POA: Diagnosis not present

## 2015-01-31 LAB — POCT INR: INR: 1.4

## 2015-01-31 MED ORDER — WARFARIN SODIUM 5 MG PO TABS: ORAL_TABLET | ORAL | Status: DC

## 2015-02-14 ENCOUNTER — Ambulatory Visit (INDEPENDENT_AMBULATORY_CARE_PROVIDER_SITE_OTHER): Payer: Medicare Other

## 2015-02-14 DIAGNOSIS — I481 Persistent atrial fibrillation: Secondary | ICD-10-CM

## 2015-02-14 DIAGNOSIS — Z5181 Encounter for therapeutic drug level monitoring: Secondary | ICD-10-CM

## 2015-02-14 DIAGNOSIS — I4819 Other persistent atrial fibrillation: Secondary | ICD-10-CM

## 2015-02-14 LAB — POCT INR: INR: 3.5

## 2015-02-16 ENCOUNTER — Other Ambulatory Visit: Payer: Self-pay | Admitting: Internal Medicine

## 2015-02-16 ENCOUNTER — Other Ambulatory Visit: Payer: Self-pay | Admitting: Cardiovascular Disease

## 2015-02-16 NOTE — Telephone Encounter (Signed)
Rx phoned into pharmacy.

## 2015-02-16 NOTE — Telephone Encounter (Signed)
Last visit 11/16/14, ok refill?

## 2015-02-27 ENCOUNTER — Telehealth: Payer: Self-pay | Admitting: *Deleted

## 2015-02-27 ENCOUNTER — Other Ambulatory Visit: Payer: Self-pay | Admitting: Nurse Practitioner

## 2015-02-27 MED ORDER — HYDROCODONE-ACETAMINOPHEN 5-325 MG PO TABS: ORAL_TABLET | ORAL | Status: DC

## 2015-02-27 NOTE — Telephone Encounter (Signed)
Came into the office requesting Hydrocodone refill.  Last OV 3.24.16, last refill 4.27.16.  Please advise refill

## 2015-02-27 NOTE — Telephone Encounter (Signed)
Pt aware Rx ready for pick up 

## 2015-02-27 NOTE — Telephone Encounter (Signed)
Printed, signed, and handed to N. Pulliam for pick up.

## 2015-02-28 ENCOUNTER — Other Ambulatory Visit: Payer: Self-pay | Admitting: Nurse Practitioner

## 2015-02-28 NOTE — Telephone Encounter (Signed)
Faxed Rx to [facility]

## 2015-02-28 NOTE — Telephone Encounter (Signed)
Last OV 3.24.16, next OV 7.13.16. Please advise refill

## 2015-03-08 ENCOUNTER — Other Ambulatory Visit: Payer: Self-pay | Admitting: Cardiovascular Disease

## 2015-03-14 ENCOUNTER — Ambulatory Visit (INDEPENDENT_AMBULATORY_CARE_PROVIDER_SITE_OTHER): Payer: Medicare Other

## 2015-03-14 DIAGNOSIS — I4819 Other persistent atrial fibrillation: Secondary | ICD-10-CM

## 2015-03-14 DIAGNOSIS — Z5181 Encounter for therapeutic drug level monitoring: Secondary | ICD-10-CM

## 2015-03-14 DIAGNOSIS — I481 Persistent atrial fibrillation: Secondary | ICD-10-CM

## 2015-03-14 LAB — POCT INR: INR: 2.9

## 2015-03-15 ENCOUNTER — Other Ambulatory Visit: Payer: Self-pay | Admitting: Internal Medicine

## 2015-03-15 NOTE — Telephone Encounter (Signed)
No past OV, no scheduled OV. Please advise refill.

## 2015-03-15 NOTE — Telephone Encounter (Signed)
I think this pt has two charts in the system. Lorane Gell is his PCP

## 2015-03-15 NOTE — Telephone Encounter (Signed)
Last OV 3.24.16, no future OV. Please advise refill. Patient has 2 charts, information is in [phone]

## 2015-03-15 NOTE — Telephone Encounter (Signed)
rx faxed

## 2015-03-15 NOTE — Telephone Encounter (Signed)
Error

## 2015-03-16 ENCOUNTER — Encounter: Payer: Self-pay | Admitting: Cardiovascular Disease

## 2015-03-16 ENCOUNTER — Ambulatory Visit (INDEPENDENT_AMBULATORY_CARE_PROVIDER_SITE_OTHER): Payer: Medicare Other | Admitting: Cardiovascular Disease

## 2015-03-16 VITALS — BP 116/80 | HR 69 | Ht 67.0 in | Wt 203.2 lb

## 2015-03-16 DIAGNOSIS — I4819 Other persistent atrial fibrillation: Secondary | ICD-10-CM

## 2015-03-16 DIAGNOSIS — I10 Essential (primary) hypertension: Secondary | ICD-10-CM

## 2015-03-16 DIAGNOSIS — E785 Hyperlipidemia, unspecified: Secondary | ICD-10-CM

## 2015-03-16 DIAGNOSIS — I2510 Atherosclerotic heart disease of native coronary artery without angina pectoris: Secondary | ICD-10-CM

## 2015-03-16 DIAGNOSIS — I481 Persistent atrial fibrillation: Secondary | ICD-10-CM

## 2015-03-16 DIAGNOSIS — I482 Chronic atrial fibrillation, unspecified: Secondary | ICD-10-CM | POA: Insufficient documentation

## 2015-03-16 MED ORDER — METOPROLOL TARTRATE 50 MG PO TABS: 50.0000 mg | ORAL_TABLET | Freq: Two times a day (BID) | ORAL | Status: DC

## 2015-03-16 NOTE — Patient Instructions (Signed)
Medication Instructions:  Your physician has recommended you make the following change in your medication:  STOP taking sotalol INCREASE metoprolol to 50mg  twice a day   Labwork: none  Testing/Procedures: none  Follow-Up: Your physician wants you to follow-up in: six months with Dr. Fletcher Anon. You will receive a reminder letter in the mail two months in advance. If you don't receive a letter, please call our office to schedule the follow-up appointment.    Any Other Special Instructions Will Be Listed Below (If Applicable). Your physician recommends you come back in one week for an EKG and BP check

## 2015-03-16 NOTE — Assessment & Plan Note (Signed)
Lab Results  Component Value Date   CHOL 141 08/14/2014   HDL 36.80* 08/14/2014   LDLCALC 73 08/14/2014   TRIG 154.0* 08/14/2014   CHOLHDL 4 08/14/2014   Continue treatment with simvastatin. We should consider switching to atorvastatin in order to avoid being on an 80 mg dose of simvastatin.

## 2015-03-16 NOTE — Assessment & Plan Note (Signed)
Blood pressure is well controlled 

## 2015-03-16 NOTE — Progress Notes (Signed)
HPI  This is a pleasant 81 year old man who is here today for a followup visit.  He has prolonged cardiac history. He underwent 6 vessel CABG in 1995 in [place]. He was diagnosed later with atrial fibrillation and was started on anticoagulation with warfarin. He has been maintaining in sinus rhythm with sotalol. He reports no recurrent ischemic events since his CABG. He has known history of hypertension, hyperlipidemia and previous tobacco use. There is family history of coronary artery disease. He is retired from [employer], [employer] (medical).  The patient continues to be in atrial fibrillation but he seems to be asymptomatic. He exercises regularly at least 3-4 times a week with no reported symptoms.    No Known Allergies   Current Outpatient Prescriptions on File Prior to Visit  Medication Sig Dispense Refill  . allopurinol (ZYLOPRIM) 300 MG tablet Take on Monday, Wednesday and Friday. 30 tablet 6  . ALPRAZolam (XANAX) 0.25 MG tablet TAKE 1 TABLET BY MOUTH AT BEDTIME 30 tablet 0  . HYDROcodone-acetaminophen (NORCO/VICODIN) 5-325 MG per tablet Take 1 tablet twice a day as needed. 60 tablet 0  . metoprolol tartrate (LOPRESSOR) 25 MG tablet TAKE 1/2 TABLET BY MOUTH TWICE DAILY 30 tablet 0  . Omeprazole Magnesium (PRILOSEC OTC PO) Take by mouth daily.    . sildenafil (VIAGRA) 100 MG tablet Take 100 mg by mouth daily as needed for erectile dysfunction.    . simvastatin (ZOCOR) 80 MG tablet TAKE 1 TABLET BY MOUTH EVERY DAY 30 tablet 6  . sotalol (BETAPACE) 80 MG tablet TAKE 1 TABLET BY MOUTH TWICE DAILY 60 tablet 3  . traMADol (ULTRAM) 50 MG tablet TAKE 1 TABLET BY MOUTH EVERY DAY AFTER SUPER 30 tablet 0  . traZODone (DESYREL) 150 MG tablet Take 1 tablet (150 mg total) by mouth at bedtime. 30 tablet 6  . warfarin (COUMADIN) 5 MG tablet TAKE AS DIRECTED BY ANTICOAGULATION CLINIC 30 tablet 0   No current facility-administered medications on file prior to visit.     Past Medical History  Diagnosis Date  . A-fib  2000       . Gout     Controlled with Allopurinol  . Hyperlipidemia   . Hypertension   . Arthritis   . History of chicken pox   . GERD (gastroesophageal reflux disease)   . Genital warts   . Seasonal allergies   . Colon polyps   . History of blood transfusion   . CAD (coronary artery disease)   . Thrombocytopathia   . Sleep apnea, obstructive      Past Surgical History  Procedure Laterality Date  . Coronary artery bypass graft  1995    CABG x 6 in [place], [place]  . Knee surgery      bilateral   . Rotator cuff repair      right  . Gallbladder surgery    . Cardiac catheterization    . Tonsillectomy and adenoidectomy  1943  . Gynecomastia excision       Family History  Problem Relation Age of Onset  . Heart disease Father   . Heart attack Father 62    MI  . Alcohol abuse Father   . Hyperlipidemia Father   . Hypertension Father   . Heart attack Paternal Grandmother 47  . Heart attack Paternal Grandfather 81  . Cancer Mother     Breast cancer and Lung Cancer  . Diabetes Brother   . Crohn's disease Daughter   . Cancer Brother  lung cancer  . Alcohol abuse Son      History   Social History  . Marital Status: Divorced    Spouse Name: N/A  . Number of Children: 7  . Years of Education: 12   Occupational History  . 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 Smoking status: Never Smoker   . Smokeless tobacco: Not on file  . Alcohol Use: Yes     Comment: occasional   . Drug Use: No  . Sexual Activity: Not on file   Other Topics Concern  . Not on file   Social History Narrative   Mr. Tingler grew up in [place], [place]. Most recently he was living in [place]. He moved to the [place] area since March 2015. Recently divorced and moved to this area to be close to one of his sons. He enjoys reading and has been going to the gym to exercise.              ROS A 10 point review of system was performed. It is  negative other than that mentioned in the history of present illness.   PHYSICAL EXAM   BP 116/80 mmHg  Pulse 69  Ht 5\' 7"  (1.702 m)  Wt 203 lb 4 oz (92.194 kg)  BMI 31.83 kg/m2 Constitutional: He is oriented to person, place, and time. He appears well-developed and well-nourished. No distress.  HENT: No nasal discharge.  Head: Normocephalic and atraumatic.  Eyes: Pupils are equal and round.  No discharge. Neck: Normal range of motion. Neck supple. No JVD present. No thyromegaly present.  Cardiovascular: Normal rate, regular rhythm, normal heart sounds. Exam reveals no gallop and no friction rub. No murmur heard.  Pulmonary/Chest: Effort normal and breath sounds normal. No stridor. No respiratory distress. He has no wheezes. He has no rales. He exhibits no tenderness.  Abdominal: Soft. Bowel sounds are normal. He exhibits no distension. There is no tenderness. There is no rebound and no guarding.  Musculoskeletal: Normal range of motion. He exhibits no edema and no tenderness.  Neurological: He is alert and oriented to person, place, and time. Coordination normal.  Skin: Skin is warm and dry. No rash noted. He is not diaphoretic. No erythema. No pallor.  Psychiatric: He has a normal mood and affect. His behavior is normal. Judgment and thought content normal.     EKG: Atrial fibrillation ABNORMAL RHYTHM  ASSESSMENT AND PLAN

## 2015-03-16 NOTE — Assessment & Plan Note (Addendum)
The patient seems to be transitioning into chronic atrial fibrillation. However, he is asymptomatic. Thus, I recommend continuing rate control. I discontinued sotalol today and increase the dose of metoprolol to 50 mg twice daily. Continue long-term anticoagulation with warfarin. I will bring him back in one week for an EKG and blood pressure check. I will consider an echocardiogram to evaluate LV systolic function in the near future.

## 2015-03-16 NOTE — Assessment & Plan Note (Signed)
He has no symptoms of angina. Continue medical therapy. 

## 2015-03-22 ENCOUNTER — Emergency Department
Admission: EM | Admit: 2015-03-22 | Discharge: 2015-03-22 | Discharge status: Against Medical Advice | Payer: Medicare Other | Attending: Emergency Medicine | Admitting: Emergency Medicine

## 2015-03-22 DIAGNOSIS — I10 Essential (primary) hypertension: Secondary | ICD-10-CM | POA: Insufficient documentation

## 2015-03-22 NOTE — ED Notes (Addendum)
Pt states he had high blood pressure this AM, headache this AM. Rechecked BP tonight and was reading 257'D systolic, denies vision changes, headache or chest pain at this time. Pt alert and oriented X4, active, cooperative, pt in NAD. RR even and unlabored, color WNL.  Pt began taking metoprolol this week; 50mg  BID.  Pt has appt with doctor tomorrow.  Pt unsure if his blood pressure reading is working at home, checked mid day 051'G systolic

## 2015-03-23 ENCOUNTER — Ambulatory Visit: Payer: Medicare Other

## 2015-03-23 ENCOUNTER — Telehealth: Payer: Self-pay

## 2015-03-23 VITALS — BP 130/90 | HR 74 | Resp 18

## 2015-03-23 DIAGNOSIS — I4891 Unspecified atrial fibrillation: Secondary | ICD-10-CM

## 2015-03-23 DIAGNOSIS — I482 Chronic atrial fibrillation, unspecified: Secondary | ICD-10-CM

## 2015-03-23 NOTE — Patient Instructions (Signed)
1.) Reason for visit: EKG and BP check  2.) Name of MD requesting visit: Arida  3.) H&P: HTN, afib  4.) ROS related to problem: At 7/22 OV, sotalol discontinued, metoprolol increased to 50mg  BID. Pt came in for followup EKG and BP check.   5.) Assessment and plan per MD: BP 130/90. EKG reviewed by Dr. Rockey Situ. Pt aflutter rate controlled. Gollan asks for review by Dr. Fletcher Anon. Pt stable and ok to leave.office.  Stated to patient I would call with Dr. Tyrell Antonio recommendations.

## 2015-03-23 NOTE — Patient Instructions (Signed)
1.) Reason for visit: EKG and BP check  2.) Name of MD requesting visit: Arida  3.) H&P: HTN, chronic afib  4.) ROS related to problem: At 7/22 OV, sotalol discontinued, metoprolol increased to 50mg  BID. Pt came in for followup EKG and BP check.  5.) Assessment and plan per MD: BP 130/90. EKG reviewed by Dr. Rockey Situ. Pt aflutter rate controlled. Gollan asks for review by Dr. Fletcher Anon. Pt stable and ok to leave.office.  Stated to patient I would call with Dr. Tyrell Antonio recommendations

## 2015-03-23 NOTE — Telephone Encounter (Signed)
Per Dr. Fletcher Anon, no changes needed at this time.  S/w pt to inform him Dr. Fletcher Anon has reviewed EKG and BP check from today. No further interventions needed. Suggested to pt he continue to monitor BP daily and call if elevated. Pt verbalized understanding.

## 2015-03-29 ENCOUNTER — Other Ambulatory Visit: Payer: Self-pay | Admitting: Nurse Practitioner

## 2015-03-29 NOTE — Telephone Encounter (Signed)
Okay to refill? Please advise.  

## 2015-04-05 ENCOUNTER — Telehealth: Payer: Self-pay | Admitting: Internal Medicine

## 2015-04-05 NOTE — Telephone Encounter (Signed)
Patient cam into office requesting refill on hydrocodone patient has not seen MD since 12/15 has seen the NP Lorane Gell. Patient has enough to last until 04/09/15, please advise as to refill.

## 2015-04-06 ENCOUNTER — Other Ambulatory Visit: Payer: Self-pay | Admitting: Internal Medicine

## 2015-04-06 MED ORDER — HYDROCODONE-ACETAMINOPHEN 5-325 MG PO TABS: ORAL_TABLET | ORAL | Status: DC

## 2015-04-06 NOTE — Telephone Encounter (Signed)
Refill for 30 days only.  OFFICE VISIT NEEDED prior to any more refills 

## 2015-04-06 NOTE — Telephone Encounter (Signed)
Patient notified needs appointment for further refills. See Nurse needs CSC and appointment.

## 2015-04-09 ENCOUNTER — Other Ambulatory Visit: Payer: Self-pay | Admitting: *Deleted

## 2015-04-09 MED ORDER — WARFARIN SODIUM 5 MG PO TABS: ORAL_TABLET | ORAL | Status: DC

## 2015-04-11 ENCOUNTER — Ambulatory Visit (INDEPENDENT_AMBULATORY_CARE_PROVIDER_SITE_OTHER): Payer: Medicare Other | Admitting: Internal Medicine

## 2015-04-11 ENCOUNTER — Encounter: Payer: Self-pay | Admitting: Internal Medicine

## 2015-04-11 ENCOUNTER — Ambulatory Visit (INDEPENDENT_AMBULATORY_CARE_PROVIDER_SITE_OTHER): Payer: Medicare Other

## 2015-04-11 VITALS — BP 106/62 | HR 77 | Temp 97.5°F | Resp 12 | Ht 67.0 in | Wt 205.0 lb

## 2015-04-11 DIAGNOSIS — I481 Persistent atrial fibrillation: Secondary | ICD-10-CM

## 2015-04-11 DIAGNOSIS — Z5181 Encounter for therapeutic drug level monitoring: Secondary | ICD-10-CM | POA: Diagnosis not present

## 2015-04-11 DIAGNOSIS — E785 Hyperlipidemia, unspecified: Secondary | ICD-10-CM | POA: Diagnosis not present

## 2015-04-11 DIAGNOSIS — E559 Vitamin D deficiency, unspecified: Secondary | ICD-10-CM | POA: Diagnosis not present

## 2015-04-11 DIAGNOSIS — F09 Unspecified mental disorder due to known physiological condition: Secondary | ICD-10-CM | POA: Diagnosis not present

## 2015-04-11 DIAGNOSIS — Z79899 Other long term (current) drug therapy: Secondary | ICD-10-CM | POA: Diagnosis not present

## 2015-04-11 DIAGNOSIS — I2510 Atherosclerotic heart disease of native coronary artery without angina pectoris: Secondary | ICD-10-CM

## 2015-04-11 DIAGNOSIS — I10 Essential (primary) hypertension: Secondary | ICD-10-CM

## 2015-04-11 DIAGNOSIS — R4189 Other symptoms and signs involving cognitive functions and awareness: Secondary | ICD-10-CM

## 2015-04-11 DIAGNOSIS — I4819 Other persistent atrial fibrillation: Secondary | ICD-10-CM

## 2015-04-11 DIAGNOSIS — G8929 Other chronic pain: Secondary | ICD-10-CM

## 2015-04-11 DIAGNOSIS — M25562 Pain in left knee: Secondary | ICD-10-CM

## 2015-04-11 LAB — POCT INR: INR: 3.6

## 2015-04-11 MED ORDER — TRAMADOL HCL 50 MG PO TABS: ORAL_TABLET | ORAL | Status: DC

## 2015-04-11 MED ORDER — HYDROCODONE-ACETAMINOPHEN 5-325 MG PO TABS: ORAL_TABLET | ORAL | Status: DC

## 2015-04-11 NOTE — Patient Instructions (Signed)
This is my  example of a  "Low GI"  Diet:  It will allow you to lose 4 to 8  lbs  per month if you follow it carefully.  Your goal with exercise is a minimum of 30 minutes of aerobic exercise 5 days per week (Walking does not count once it becomes easy!)    All of the foods can be found at grocery stores and in bulk at [employer].  The Atkins protein bars and shakes are available in more varieties at Target, [employer] and [place].     7 AM Breakfast:  Choose from the following:  Low carbohydrate Protein  Shakes (I recommend the EAS AdvantEdge "Carb Control" shakes  Or the low carb shakes by Atkins.    2.5 carbs)   Arnold's "Sandwhich Thin"toasted  w/ peanut butter (no jelly: about 20 net carbs  "Bagel Thin" with cream cheese and salmon: about 20 carbs   a scrambled egg/bacon/cheese burrito made with Mission's "carb balance" whole wheat tortilla  (about 10 net carbs )  Bj's sells a frittata (basically a quiche without the pastry crust) that is eaten cold and very convenient way to get your eggs  If you make your own shakes, avoid bananas and pineapple,  And use low carb greek yogurt or almond milk    Avoid cereal and bananas, oatmeal and cream of wheat and grits. They are loaded with carbohydrates!   10 AM: high protein snack:  Protein bar by Atkins (the snack size, under 200 cal, usually < 6 net carbs).    A stick of cheese:  Around 1 carb,  100 cal     Dannon Light n Fit [place] Yogurt  (80 cal, 8 carbs)  Other so called "protein bars" and Greek yogurts tend to be loaded with carbohydrates.  Remember, in food advertising, the word "energy" is synonymous for " carbohydrate."  Lunch:   A Sandwich using the bread choices listed, Can use any  Eggs,  lunchmeat, grilled meat or canned tuna), avocado, regular mayo/mustard  and cheese.  A Salad using blue cheese, ranch,  Goddess or vinagrette,  Avoid taco shells, croutons or "confetti" and no "candied nuts" but regular nuts OK.   No pretzels or  chips.  Pickles and miniature sweet peppers are a good low carb alternative that provide a "crunch"  The bread is the only source of carbohydrate in a sandwich and  can be decreased by trying some of these alternatives to traditional loaf bread  Joseph's makes a pita bread and a flat bread that are 50 cal and 4 net carbs available at [employer] and [place].  This can be toasted to use with hummous as well  Toufayan makes a low carb flatbread that's 100 cal and 9 net carbs available at [employer] and BJ's makes 2 sizes of  Low carb whole wheat tortilla  (The large one is 210 cal and 6 net carbs)  Ezekiel bread is a loaf bread sold in the frozen section of higher end grocery chains,  Very low carb  Avoid "Low fat dressings, as well as Barry Brunner and Freedom dressings They are loaded with sugar!   3 PM/ Mid day  Snack:  Consider  1 ounce of  almonds, walnuts, pistachios, pecans, peanuts,  Macadamia nuts or a nut medley.  Avoid "granola"; the dried cranberries and raisins are loaded with carbohydrates. Mixed nuts as long as there are no raisins,  cranberries or dried fruit.  Try the prosciutto/mozzarella cheese sticks by Fiorruci  In deli /backery section   High protein      6 PM  Dinner:     Meat/fowl/fish with a green salad, and either broccoli, cauliflower, green beans, spinach, brussel sprouts or  Lima beans. DO NOT BREAD THE PROTEIN!!      There is a low carb pasta by Dreamfield's that is acceptable and tastes great: only 5 digestible carbs/serving.( All grocery stores but BJs carry it )  Try Hurley Cisco Angelo's chicken piccata or chicken or eggplant parm over low carb pasta.(Lowes and BJs)   Marjory Lies Sanchez's "Carnitas" (pulled pork, no sauce,  0 carbs) or his beef pot roast to make a dinner burrito (at BJ's)  Pesto over low carb pasta (bj's sells a good quality pesto in the center refrigerated section of the deli   Try satueeing  Cheral Marker with mushroooms  Whole wheat pasta is still full  of digestible carbs and  Not as low in glycemic index as Dreamfield's.   Brown rice is still rice,  So skip the rice and noodles if you eat [place] or [place] (or at least limit to 1/2 cup)  9 PM snack :   Breyer's "low carb" fudgsicle or  ice cream bar (Carb Smart line), or  Weight Watcher's ice cream bar , or another "no sugar added" ice cream;  a serving of fresh berries/cherries with whipped cream   Cheese or DANNON'S LlGHT N FIT GREEK YOGURT  8 ounces of [place] unsweetened almond/cococunut milk    Treat yourself to a parfait made with whipped cream blueberiies, walnuts and vanilla greek yogurt  Avoid bananas, pineapple, grapes  and watermelon on a regular basis because they are high in sugar.  THINK OF THEM AS DESSERT  Remember that snack Substitutions should be less than 10 NET carbs per serving and meals < 20 carbs. Remember to subtract fiber grams to get the "net carbs."

## 2015-04-11 NOTE — Progress Notes (Signed)
Pre-visit discussion using our clinic review tool. No additional management support is needed unless otherwise documented below in the visit note.  

## 2015-04-11 NOTE — Progress Notes (Signed)
Subjective:  Patient ID: Jeffery Reynolds, male    DOB: 1934/01/01  Age: 81 y.o. MRN: [id number]  CC: The primary encounter diagnosis was Acquired cognitive dysfunction. Diagnoses of Hyperlipidemia, Vitamin D deficiency, Long-term use of high-risk medication, Cognitive decline, Essential hypertension, and Chronic pain of left knee were also pertinent to this visit.  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  Has history of chronic atrial fibrillation, and doesn't remember the details of the visit. States that his memory has been poor for many years,  And that he compensates by writing  everything down and using a pill box .   Had ortho referral for shoulder pain and received a subacromial bursa injection of left shoulder in April by [facility] for treatment of bicipital  tendonitis and OA of glenohumeral joint .  Pain improved transiently but has returned . States that he developed the tendonitisi due to excessive bench pressing.     Obesity:  Has gained 7 lbs since July 28 . Marland Kitchen Still exercising but limited by shoulder pain.  Outpatient Prescriptions Prior to Visit  Medication Sig Dispense Refill  . allopurinol (ZYLOPRIM) 300 MG tablet Take on Monday, Wednesday and Friday. 30 tablet 6  . ALPRAZolam (XANAX) 0.25 MG tablet TAKE 1 TABLET BY MOUTH AT BEDTIME 30 tablet 0  . metoprolol (LOPRESSOR) 50 MG tablet Take 1 tablet (50 mg total) by mouth 2 (two) times daily. 60 tablet 5  . Omeprazole Magnesium (PRILOSEC OTC PO) Take by mouth daily.    . sildenafil (VIAGRA) 100 MG tablet Take 100 mg by mouth daily as needed for erectile dysfunction.    . simvastatin (ZOCOR) 80 MG tablet TAKE 1 TABLET BY MOUTH EVERY DAY 30 tablet 6  . traZODone (DESYREL) 150 MG tablet Take 1 tablet (150 mg total) by mouth at bedtime. 30 tablet 6  . warfarin (COUMADIN) 5 MG tablet TAKE AS  DIRECTED BY ANTICOAGULATION CLINIC 30 tablet 2  . ALPRAZolam (XANAX) 0.25 MG tablet TAKE 1 TABLET BY MOUTH EVERY NIGHT AT BEDTIME 30 tablet 0  . HYDROcodone-acetaminophen (NORCO/VICODIN) 5-325 MG per tablet Take 1 tablet twice a day as needed. 60 tablet 0  . traMADol (ULTRAM) 50 MG tablet TAKE 1 TABLET BY MOUTH EVERY DAY AFTER SUPPER 30 tablet 0   No facility-administered medications prior to visit.    Review of Systems;  Patient denies headache, fevers, malaise, unintentional weight loss, skin rash, eye pain, sinus congestion and sinus pain, sore throat, dysphagia,  hemoptysis , cough, dyspnea, wheezing, chest pain, palpitations, orthopnea, edema, abdominal pain, nausea, melena, diarrhea, constipation, flank pain, dysuria, hematuria, urinary  Frequency, nocturia, numbness, tingling, seizures,  Focal weakness, Loss of consciousness,  Tremor, insomnia, depression, anxiety, and suicidal ideation.     Objective:  BP 106/62 mmHg  Pulse 77  Temp(Src) 97.5 F (36.4 C) (Oral)  Resp 12  Ht 5\' 7"  (1.702 m)  Wt 205 lb (92.987 kg)  BMI 32.10 kg/m2  SpO2 98%  BP Readings from Last 3 Encounters:  04/11/15 106/62  03/23/15 130/90  03/23/15 130/90    Wt Readings from Last 3 Encounters:  04/11/15 205 lb (92.987 kg)  03/22/15 198 lb (89.812 kg)  03/16/15 203 lb 4 oz (92.194 kg)    General appearance: alert, cooperative and appears stated age Ears: normal TM's and external ear canals both  ears Throat: lips, mucosa, and tongue normal; teeth and gums normal Neck: no adenopathy, no carotid bruit, supple, symmetrical, trachea midline and thyroid not enlarged, symmetric, no tenderness/mass/nodules Back: symmetric, no curvature. ROM normal. No CVA tenderness. Lungs: clear to auscultation bilaterally Heart: regular rate and rhythm, S1, S2 normal, no murmur, click, rub or gallop Abdomen: soft, non-tender; bowel sounds normal; no masses,  no organomegaly Pulses: 2+ and symmetric Skin: Skin color,  texture, turgor normal. No rashes or lesions Lymph nodes: Cervical, supraclavicular, and axillary nodes normal.  Lab Results  Component Value Date   HGBA1C 5.1 04/18/2014    Lab Results  Component Value Date   CREATININE 1.24 04/11/2015   CREATININE 1.1 08/14/2014   CREATININE 1.01 03/24/2014    Lab Results  Component Value Date   WBC 3.5* 04/11/2015   HGB 13.9 04/11/2015   HCT 41.4 04/11/2015   PLT 83.0* 04/11/2015   GLUCOSE 105* 04/11/2015   CHOL 126 04/11/2015   TRIG 172.0* 04/11/2015   HDL 39.20 04/11/2015   LDLCALC 53 04/11/2015   ALT 26 04/11/2015   AST 21 04/11/2015   NA 143 04/11/2015   K 5.0 04/11/2015   CL 106 04/11/2015   CREATININE 1.24 04/11/2015   BUN 18 04/11/2015   CO2 33* 04/11/2015   TSH 1.29 04/11/2015   INR 3.6 04/11/2015   HGBA1C 5.1 04/18/2014    No results found.  Assessment & Plan:   Problem List Items Addressed This Visit      Unprioritized   Essential hypertension    Well controlled on current regimen. Renal function stable, no changes today.  Lab Results  Component Value Date   CREATININE 1.24 04/11/2015   Lab Results  Component Value Date   NA 143 04/11/2015   K 5.0 04/11/2015   CL 106 04/11/2015   CO2 33* 04/11/2015         Chronic pain of left knee    Managed with  tramadol for evening use given patient's report of increased insomnia with use of vicodin in the evening,  Continue #60 vicodin monthly for bid use        Relevant Medications   traMADol (ULTRAM) 50 MG tablet   HYDROcodone-acetaminophen (NORCO/VICODIN) 5-325 MG per tablet   Cognitive decline    Prior workup unknown .  Will screen for thyroid, B12 deficiencies and syphilis.  MRI brain needed.   Lab Results  Component Value Date   TSH 1.29 04/11/2015   Lab Results  Component Value Date   [id number] 009 04/11/2015   No results found for: RPR       Hyperlipidemia   Relevant Orders   Lipid panel (Completed)    Other Visit Diagnoses     Acquired cognitive dysfunction    -  Primary    Relevant Orders    TSH (Completed)    RPR (Completed)    Vitamin B12 (Completed)    Vitamin D deficiency        Relevant Orders    Vit D  25 hydroxy (rtn osteoporosis monitoring) (Completed)    Long-term use of high-risk medication        Relevant Orders    Comprehensive metabolic panel (Completed)    CBC with Differential/Platelet (Completed)       I am having Jeffery Reynolds maintain his sildenafil, Omeprazole Magnesium (PRILOSEC OTC PO), allopurinol, traZODone, simvastatin, ALPRAZolam, metoprolol, warfarin, traMADol, and HYDROcodone-acetaminophen.  Meds ordered this encounter  Medications  . traMADol (ULTRAM) 50 MG tablet  Sig: TAKE 1 TABLET BY MOUTH EVERY DAY AFTER SUPPER    Dispense:  90 tablet    Refill:  1    90 DAY SUPPLY  . DISCONTD: HYDROcodone-acetaminophen (NORCO/VICODIN) 5-325 MG per tablet    Sig: Take 1 tablet twice a day as needed.    Dispense:  60 tablet    Refill:  0    MAY REFILL ON OR AFTER SEPT 12 2016  . DISCONTD: HYDROcodone-acetaminophen (NORCO/VICODIN) 5-325 MG per tablet    Sig: Take 1 tablet twice a day as needed.    Dispense:  60 tablet    Refill:  0    MAY REFILL ON OR AFTER Jun 06 2015  . HYDROcodone-acetaminophen (NORCO/VICODIN) 5-325 MG per tablet    Sig: Take 1 tablet twice a day as needed.    Dispense:  60 tablet    Refill:  0    MAY REFILL ON OR AFTER Jul 07 2015    Medications Discontinued During This Encounter  Medication Reason  . ALPRAZolam (XANAX) 2.50 MG tablet Duplicate  . traMADol (ULTRAM) 50 MG tablet Reorder  . HYDROcodone-acetaminophen (NORCO/VICODIN) 5-325 MG per tablet Reorder  . HYDROcodone-acetaminophen (NORCO/VICODIN) 5-325 MG per tablet Reorder  . HYDROcodone-acetaminophen (NORCO/VICODIN) 5-325 MG per tablet Reorder    Follow-up: Return in about 6 months (around 10/12/2015).   Crecencio Mc, MD

## 2015-04-12 LAB — CBC WITH DIFFERENTIAL/PLATELET
Basophils Absolute: 0 10*3/uL (ref 0.0–0.1)
Basophils Relative: 0.4 % (ref 0.0–3.0)
Eosinophils Absolute: 0.1 10*3/uL (ref 0.0–0.7)
Eosinophils Relative: 2.5 % (ref 0.0–5.0)
HCT: 41.4 % (ref 39.0–52.0)
Hemoglobin: 13.9 g/dL (ref 13.0–17.0)
Lymphocytes Relative: 22.6 % (ref 12.0–46.0)
Lymphs Abs: 0.8 10*3/uL (ref 0.7–4.0)
MCHC: 33.5 g/dL (ref 30.0–36.0)
MCV: 98.1 fl (ref 78.0–100.0)
Monocytes Absolute: 0.4 10*3/uL (ref 0.1–1.0)
Monocytes Relative: 11.4 % (ref 3.0–12.0)
Neutro Abs: 2.2 10*3/uL (ref 1.4–7.7)
Neutrophils Relative %: 63.1 % (ref 43.0–77.0)
Platelets: 83 10*3/uL — ABNORMAL LOW (ref 150.0–400.0)
RBC: 4.22 Mil/uL (ref 4.22–5.81)
RDW: 14.3 % (ref 11.5–15.5)
WBC: 3.5 10*3/uL — ABNORMAL LOW (ref 4.0–10.5)

## 2015-04-12 LAB — COMPREHENSIVE METABOLIC PANEL
ALT: 26 U/L (ref 0–53)
AST: 21 U/L (ref 0–37)
Albumin: 4.1 g/dL (ref 3.5–5.2)
Alkaline Phosphatase: 71 U/L (ref 39–117)
BUN: 18 mg/dL (ref 6–23)
CO2: 33 mEq/L — ABNORMAL HIGH (ref 19–32)
Calcium: 9.2 mg/dL (ref 8.4–10.5)
Chloride: 106 mEq/L (ref 96–112)
Creatinine, Ser: 1.24 mg/dL (ref 0.40–1.50)
GFR: 59.67 mL/min — ABNORMAL LOW (ref >60.00)
Glucose, Bld: 105 mg/dL — ABNORMAL HIGH (ref 70–99)
Potassium: 5 mEq/L (ref 3.5–5.1)
Sodium: 143 mEq/L (ref 135–145)
Total Bilirubin: 0.7 mg/dL (ref 0.2–1.2)
Total Protein: 6 g/dL (ref 6.0–8.3)

## 2015-04-12 LAB — TSH: TSH: 1.29 u[IU]/mL (ref 0.35–4.50)

## 2015-04-12 LAB — LIPID PANEL
Cholesterol: 126 mg/dL (ref 0–200)
HDL: 39.2 mg/dL (ref >39.00)
LDL Cholesterol: 53 mg/dL (ref 0–99)
NonHDL: 87.06
Total CHOL/HDL Ratio: 3
Triglycerides: 172 mg/dL — ABNORMAL HIGH (ref 0.0–149.0)
VLDL: 34.4 mg/dL (ref 0.0–40.0)

## 2015-04-12 LAB — VITAMIN D 25 HYDROXY (VIT D DEFICIENCY, FRACTURES): VITD: 26.55 ng/mL — ABNORMAL LOW (ref 30.00–100.00)

## 2015-04-12 LAB — RPR

## 2015-04-12 LAB — VITAMIN B12: Vitamin B-12: 491 pg/mL (ref 211–911)

## 2015-04-13 ENCOUNTER — Encounter: Payer: Self-pay | Admitting: Internal Medicine

## 2015-04-13 DIAGNOSIS — G3184 Mild cognitive impairment, so stated: Secondary | ICD-10-CM | POA: Insufficient documentation

## 2015-04-14 ENCOUNTER — Other Ambulatory Visit: Payer: Self-pay | Admitting: Nurse Practitioner

## 2015-04-14 NOTE — Assessment & Plan Note (Addendum)
Prior workup unknown .  Will screen for thyroid, B12 deficiencies and syphilis.  MRI brain needed.   Lab Results  Component Value Date   TSH 1.29 04/11/2015   Lab Results  Component Value Date   [id number] 183 04/11/2015   No results found for: RPR

## 2015-04-14 NOTE — Assessment & Plan Note (Signed)
Managed with  tramadol for evening use given patient's report of increased insomnia with use of vicodin in the evening,  Continue #60 vicodin monthly for bid use

## 2015-04-14 NOTE — Assessment & Plan Note (Signed)
Well controlled on current regimen. Renal function stable, no changes today.  Lab Results  Component Value Date   CREATININE 1.24 04/11/2015   Lab Results  Component Value Date   NA 143 04/11/2015   K 5.0 04/11/2015   CL 106 04/11/2015   CO2 33* 04/11/2015

## 2015-04-16 ENCOUNTER — Other Ambulatory Visit: Payer: Self-pay | Admitting: Nurse Practitioner

## 2015-04-16 NOTE — Telephone Encounter (Signed)
Ok to refill,  printed rx  

## 2015-04-16 NOTE — Telephone Encounter (Signed)
Last OV 8.17.16.  Please advise refill 

## 2015-04-16 NOTE — Telephone Encounter (Signed)
Last OV 8.17.16, last refill 6.24.16.  Please advise refill

## 2015-04-24 ENCOUNTER — Other Ambulatory Visit: Payer: Self-pay | Admitting: *Deleted

## 2015-04-24 MED ORDER — TRAZODONE HCL 150 MG PO TABS: 150.0000 mg | ORAL_TABLET | Freq: Every day | ORAL | Status: DC

## 2015-05-01 ENCOUNTER — Other Ambulatory Visit: Payer: Self-pay | Admitting: Nurse Practitioner

## 2015-05-01 NOTE — Telephone Encounter (Signed)
rx faxed

## 2015-05-01 NOTE — Telephone Encounter (Signed)
Ok to refill,  printed rx  

## 2015-05-01 NOTE — Telephone Encounter (Signed)
Last OV 8.17.16.  Please advise refill 

## 2015-05-02 ENCOUNTER — Ambulatory Visit (INDEPENDENT_AMBULATORY_CARE_PROVIDER_SITE_OTHER): Payer: Medicare Other

## 2015-05-02 DIAGNOSIS — I481 Persistent atrial fibrillation: Secondary | ICD-10-CM | POA: Diagnosis not present

## 2015-05-02 DIAGNOSIS — I4819 Other persistent atrial fibrillation: Secondary | ICD-10-CM

## 2015-05-02 DIAGNOSIS — Z5181 Encounter for therapeutic drug level monitoring: Secondary | ICD-10-CM | POA: Diagnosis not present

## 2015-05-02 LAB — POCT INR: INR: 1.6

## 2015-05-16 ENCOUNTER — Ambulatory Visit (INDEPENDENT_AMBULATORY_CARE_PROVIDER_SITE_OTHER): Payer: Medicare Other

## 2015-05-16 DIAGNOSIS — Z5181 Encounter for therapeutic drug level monitoring: Secondary | ICD-10-CM

## 2015-05-16 DIAGNOSIS — I481 Persistent atrial fibrillation: Secondary | ICD-10-CM

## 2015-05-16 DIAGNOSIS — I4819 Other persistent atrial fibrillation: Secondary | ICD-10-CM

## 2015-05-16 LAB — POCT INR: INR: 2.1

## 2015-05-18 ENCOUNTER — Telehealth: Payer: Self-pay | Admitting: *Deleted

## 2015-05-18 ENCOUNTER — Other Ambulatory Visit: Payer: Self-pay | Admitting: *Deleted

## 2015-05-18 MED ORDER — ALLOPURINOL 300 MG PO TABS: ORAL_TABLET | ORAL | Status: DC

## 2015-05-18 NOTE — Telephone Encounter (Signed)
Patient has requested a medication refill for Zylorim- thanks

## 2015-06-01 ENCOUNTER — Telehealth: Payer: Self-pay | Admitting: *Deleted

## 2015-06-01 NOTE — Telephone Encounter (Signed)
Pt calling stating that he is been feeling a bit dizzy When he gets up and walks he feels like he doesn't have control over feet States this has never happened before.  Would like to know what can he do or what may be causing this. Please advise

## 2015-06-01 NOTE — Telephone Encounter (Signed)
S/w pt who states he has been dizzy since yesterday morning but went to work.  Feels like equilibrium is off. Feet do not go the way he wants them to and he gets off balance. Denies falling.  States BP 120s/60s-70s, HR 60s Takes medications as directed, no new medications added. Denies pain anywhere in his body. No SOB. Feels like he is getting a cold that started same time as dizziness. Has mucus and stuffy nose. Advised pt to contact PCP regarding symptoms. Pt states he has appt w/[facility] but not until 10/18 Advised him to call PCP for sooner appt. Pt verbalized understanding w/no further questions.

## 2015-06-06 ENCOUNTER — Ambulatory Visit (INDEPENDENT_AMBULATORY_CARE_PROVIDER_SITE_OTHER): Payer: Medicare Other

## 2015-06-06 DIAGNOSIS — Z5181 Encounter for therapeutic drug level monitoring: Secondary | ICD-10-CM | POA: Diagnosis not present

## 2015-06-06 DIAGNOSIS — I4819 Other persistent atrial fibrillation: Secondary | ICD-10-CM

## 2015-06-06 DIAGNOSIS — I481 Persistent atrial fibrillation: Secondary | ICD-10-CM | POA: Diagnosis not present

## 2015-06-06 LAB — POCT INR: INR: 2.5

## 2015-06-12 ENCOUNTER — Ambulatory Visit (INDEPENDENT_AMBULATORY_CARE_PROVIDER_SITE_OTHER): Payer: Medicare Other | Admitting: Internal Medicine

## 2015-06-12 ENCOUNTER — Encounter: Payer: Self-pay | Admitting: Internal Medicine

## 2015-06-12 VITALS — BP 122/72 | HR 67 | Temp 98.2°F | Resp 12 | Ht 67.0 in | Wt 203.4 lb

## 2015-06-12 DIAGNOSIS — E669 Obesity, unspecified: Secondary | ICD-10-CM | POA: Diagnosis not present

## 2015-06-12 DIAGNOSIS — I2510 Atherosclerotic heart disease of native coronary artery without angina pectoris: Secondary | ICD-10-CM

## 2015-06-12 DIAGNOSIS — R4189 Other symptoms and signs involving cognitive functions and awareness: Secondary | ICD-10-CM

## 2015-06-12 DIAGNOSIS — D234 Other benign neoplasm of skin of scalp and neck: Secondary | ICD-10-CM

## 2015-06-12 DIAGNOSIS — D224 Melanocytic nevi of scalp and neck: Secondary | ICD-10-CM | POA: Diagnosis not present

## 2015-06-12 NOTE — Progress Notes (Signed)
Pre-visit discussion using our clinic review tool. No additional management support is needed unless otherwise documented below in the visit note.  

## 2015-06-12 NOTE — Patient Instructions (Addendum)
I am referring you to [facility] to check out your mole.  I am ordering the MRI of your brain to help [place] treat your memory loss.  the office will call you with both of these appointments  I would like you to return for an ear cleaning   This is  my version of a  "Low GI"  Weight loss Diet:  It has enabled many of my patients to lose up to 10 lbs per month depending on how much you restrict your carbs.   follow it carefully.  The idea behind low carb diets is that your body has to take the fat and protein in your food and turn it into an energy that is less efficient than carbohydrates, so you lose weight.    I have listed several choices at each of your 6 meal times to make it easy to shop.and plan    Remember that snack Substitutions should be equal to or less than 5 NET carbs per serving and  The only carbs in meals come from the pasta or vegetables so, they should be < 15 net carbs. Remember that carbohydrates from fiber do not count, so you can  subtract fiber grams to get the "net carbs " of any particular food item.    All of the foods can be found at grocery stores and in bulk at [employer].  The Atkins protein bars and shakes are available in more varieties at Target, [employer] and [place].     7 AM Breakfast:  Choose from the following:  Low carbohydrate Protein  Shakes (I recommend the EAS AdvantEdge "Carb Control" shakes, Atkins,  Muscle Milk or Premier Protein shakes  All are < 4  carbs . My favorite is the premier protein chocolate   a scrambled egg/bacon/cheese burrito made with Mission's "carb balance" whole wheat tortilla  (this particular tortilla has only 6 net carbs, once you subtract the fiber grams  )  A slice of home made fritatta (egg based dish without a crust:  google it)    Avoid cereal and bananas, oatmeal and cream of wheat and grits. They are loaded with carbohydrates and have too few fiber grams    10 AM: high protein snack  Protein bar by Atkins  Or  KIND  (the lw sugar variety,  Not all are low , under 200 cal, usually < 6 net carbs).    A stick of cheese:  Around 1 carb,  100 cal      Other so called "protein bars" and Greek yogurts tend to be loaded with carbohydrates.  Remember, in food advertising, the word "energy" is synonymous for " carbohydrate."  Lunch:   A Sandwich using the bread choices listed below,  You  Can use any  Eggs,  lunchmeat, grilled meat or canned tuna), avocado, regular mayo/mustard  and cheese.  A Salad using blue cheese, ranch,  Goddess or vinagrette,  No croutons or "confetti" and no "candied nuts" but regular nuts OK. No "fat free": they add sugar for taste  No pretzels or chips.  Pickles and miniature sweet peppers are a good low carb alternative that provide a "crunch"   Please Note:  The bread is the only source of carbohydrate in a sandwich and  can be decreased by trying some of these alternatives to traditional loaf bread. Kristopher Oppenheim has the best selection:  Joseph's makes a pita bread and a flat ("Lavash")  bread that are 50 cal  and 4 net carbs and are available at [place] [employer] [place].  These can be toasted to make them taste better,  And can be  used as a pita chip to use with hummous as well  Toufayan makes a low carb flatbread that's 100 cal and 9 net carbs available at [employer] and [employer]J's makes 2 sizes of  Low carb whole wheat tortilla  (The large one is 210 cal and 6 net carbs, the small is 120 cal and also 6 net carbs)  Flat Out makes flatbreads that are low carb as well . They're called "Fold Its")   Avoid "Low fat dressings, as well as Barry Brunner and [place] dressings They are loaded with sugar!   3 PM/ Mid day  Snack:  Consider  1 ounce of  almonds, walnuts, pistachios, pecans, peanuts,  Macadamia nuts or a nut medley are ok .  Avoid "granola"; the dried cranberries and raisins are loaded with carbohydrates. Mixed nuts as long as there are no raisins,  cranberries or dried fruit.     Try the prosciutto/mozzarella cheese sticks by [employer]  In deli /backery section   High protein 80 cal each , 1 carb   To avoid overindulging in snacks: Try drinking a glass of unsweeted almond/coconut milk  Or a cup of coffee with your Atkins chocolate bar to keep you from having 3!!!   Pork rinds!  Yes Pork Rinds  Are on the diet .  They are your potato chip.        6 PM  Dinner:     Meat/fowl/fish with a green salad, with steamed/grilled/sauteed vegggie: broccoli, cauliflower, green beans, spinach, brussel sprouts or  Lima beans. DO NOT BREAD THE PROTEIN!!      There is a low carb pasta by Dreamfield's that is acceptable and tastes great: only 5 digestible carbs/serving.( All grocery stores but BJs carry it )  Try Hurley Cisco Angelo's chicken piccata or chicken or eggplant parm over low carb pasta.(Lowes and BJs)   Marjory Lies Sanchez's "Carnitas" (pulled pork, no sauce,  0 carbs) can be used to make a dinner  burrito (available at BJ's ) and his pot roast is also without veggies or potatoes   Pesto over low carb pasta (bj's sells a good quality pesto in the center refrigerated section of the deli   Try satueeing  Cheral Marker with mushrooms as another side dish   Whole wheat pasta is still full of digestible carbs and  Not as low in glycemic index as Dreamfield's.   Brown rice is still rice,  So skip the rice and noodles if you eat [place] or [place] (or at least limit to 1 cooked cup)  9 PM snack :   Breyer's "low carb" fudgsicle or  ice cream bar (Carb Smart line), or  Weight Watcher's ice cream bar , or another "no sugar added" ice cream;  a serving of fresh berries/cherries with whipped cream   Cheese or DANNON'S LlG[employer] GREEK YOGURT or the [place]os greek yogurt   8 ounces of [place] unsweetened almond/cococunut milk  Cheese and crackers (using WASA crackers,  They are low carb) or peanut butter on low carb crackers or pita bread     Avoid bananas, pineapple, grapes  and watermelon on a regular  basis because they are high in sugar.  THINK OF THEM AS DESSERT. Stick to" berries and cherries"

## 2015-06-12 NOTE — Progress Notes (Signed)
Subjective:  Patient ID: Jeffery Reynolds, male    DOB: 1933-11-04  Age: 81 y.o. MRN: [id number]  CC: The primary encounter diagnosis was Cognitive decline. Diagnoses of Dysplastic nevus of neck and Obesity were also pertinent to this visit.  HPI Jeffery Reynolds presents for follow up on multiple issues  Episode of right ear pain last week was  accompaniedn by dizziness that lasted 3 days, then resolved spontaneously   Needs dermatology for hyerpigmented nevus on left clavicle  Wants to lose weight, lost the diet I gave him at last visit.  He is Exercising 3/week 2 hours, per session upper body At [employer] .  He has tried recumbent bike but because of her chronic left knee orthopedic issues,  His left thigh swells significantly.  He has had TKR x 2, the  last time in [place] and was told that no additional intervention can be done.   Dementia:  He reports that he has always had difficulty concentrating and remembering details since he was a child.  However, he has decided to have an MRI of the brain for assessment of short term memroy loss.  He recalls suffering several concussions as a teenager playing foot ball.    Outpatient Prescriptions Prior to Visit  Medication Sig Dispense Refill  . allopurinol (ZYLOPRIM) 300 MG tablet Take on Monday, Wednesday and Friday. 30 tablet 6  . ALPRAZolam (XANAX) 0.25 MG tablet TAKE 1 TABLET BY MOUTH EVERY NIGHT AT BEDTIME 30 tablet 5  . HYDROcodone-acetaminophen (NORCO/VICODIN) 5-325 MG per tablet Take 1 tablet twice a day as needed. 60 tablet 0  . metoprolol (LOPRESSOR) 50 MG tablet Take 1 tablet (50 mg total) by mouth 2 (two) times daily. 60 tablet 5  . Omeprazole Magnesium (PRILOSEC OTC PO) Take by mouth daily.    . sildenafil (VIAGRA) 100 MG tablet Take 100 mg by mouth daily as needed for erectile dysfunction.    . simvastatin (ZOCOR) 80 MG tablet TAKE 1 TABLET BY MOUTH EVERY DAY 30 tablet 6  . traMADol (ULTRAM) 50 MG tablet TAKE 1 TABLET  BY MOUTH EVERY DAY AT SUPPER 30 tablet 5  . traZODone (DESYREL) 150 MG tablet Take 1 tablet (150 mg total) by mouth at bedtime. 30 tablet 6  . warfarin (COUMADIN) 5 MG tablet TAKE AS DIRECTED BY ANTICOAGULATION CLINIC (Patient taking differently: Take 5 mg by mouth daily at 6 PM. TAKE AS DIRECTED BY ANTICOAGULATION CLINIC) 30 tablet 2   No facility-administered medications prior to visit.    Review of Systems;  Patient denies headache, fevers, malaise, unintentional weight loss, skin rash, eye pain, sinus congestion and sinus pain, sore throat, dysphagia,  hemoptysis , cough, dyspnea, wheezing, chest pain, palpitations, orthopnea, edema, abdominal pain, nausea, melena, diarrhea, constipation, flank pain, dysuria, hematuria, urinary  Frequency, nocturia, numbness, tingling, seizures,  Focal weakness, Loss of consciousness,  Tremor, insomnia, depression, anxiety, and suicidal ideation.      Objective:  BP 122/72 mmHg  Pulse 67  Temp(Src) 98.2 F (36.8 C) (Oral)  Resp 12  Ht 5\' 7"  (1.702 m)  Wt 203 lb 6 oz (92.25 kg)  BMI 31.85 kg/m2  SpO2 98%  BP Readings from Last 3 Encounters:  06/12/15 122/72  04/11/15 106/62  03/23/15 130/90    Wt Readings from Last 3 Encounters:  06/12/15 203 lb 6 oz (92.25 kg)  04/11/15 205 lb (92.987 kg)  03/22/15 198 lb (89.812 kg)    General appearance: alert, cooperative and appears stated  age Ears: normal TM's and external ear canals both ears Throat: lips, mucosa, and tongue normal; teeth and gums normal Neck: no adenopathy, no carotid bruit, supple, symmetrical, trachea midline and thyroid not enlarged, symmetric, no tenderness/mass/nodules Back: symmetric, no curvature. ROM normal. No CVA tenderness. Lungs: clear to auscultation bilaterally Heart: regular rate and rhythm, S1, S2 normal, no murmur, click, rub or gallop Abdomen: soft, non-tender; bowel sounds normal; no masses,  no organomegaly Pulses: 2+ and symmetric Skin:  < 0.5 cm  hyperpigmented nevus with irregular border on left clavicle  Lymph nodes: Cervical, supraclavicular, and axillary nodes normal.  Lab Results  Component Value Date   HGBA1C 5.1 04/18/2014    Lab Results  Component Value Date   CREATININE 1.24 04/11/2015   CREATININE 1.1 08/14/2014   CREATININE 1.01 03/24/2014    Lab Results  Component Value Date   WBC 3.5* 04/11/2015   HGB 13.9 04/11/2015   HCT 41.4 04/11/2015   PLT 83.0* 04/11/2015   GLUCOSE 105* 04/11/2015   CHOL 126 04/11/2015   TRIG 172.0* 04/11/2015   HDL 39.20 04/11/2015   LDLCALC 53 04/11/2015   ALT 26 04/11/2015   AST 21 04/11/2015   NA 143 04/11/2015   K 5.0 04/11/2015   CL 106 04/11/2015   CREATININE 1.24 04/11/2015   BUN 18 04/11/2015   CO2 33* 04/11/2015   TSH 1.29 04/11/2015   INR 2.5 06/06/2015   HGBA1C 5.1 04/18/2014    No results found.  Assessment & Plan:   Problem List Items Addressed This Visit    Cognitive decline - Primary     workup thus far has ruled out thyroid, B12 deficiencies and syphilis.  MRI brain to be ordered.    Lab Results  Component Value Date   TSH 1.29 04/11/2015   Lab Results  Component Value Date   [id number] 04/11/2015   No results found for: RPR         Dysplastic nevus of neck    Referral to [facility] for evaluation to rule out melanoma.       Obesity    I have addressed  BMI and recommended wt loss of 10% of body weight over the next 6 months using a low glycemic index diet and regular exercise a minimum of 5 days per week.   Printed diet given.           I am having Jeffery Reynolds maintain his sildenafil, Omeprazole Magnesium (PRILOSEC OTC PO), simvastatin, metoprolol, warfarin, HYDROcodone-acetaminophen, ALPRAZolam, traZODone, traMADol, and allopurinol.  No orders of the defined types were placed in this encounter.    There are no discontinued medications.  Follow-up: No Follow-up on file.   Crecencio Mc, MD

## 2015-06-13 DIAGNOSIS — E669 Obesity, unspecified: Secondary | ICD-10-CM | POA: Insufficient documentation

## 2015-06-13 DIAGNOSIS — D234 Other benign neoplasm of skin of scalp and neck: Secondary | ICD-10-CM | POA: Insufficient documentation

## 2015-06-13 NOTE — Assessment & Plan Note (Signed)
workup thus far has ruled out thyroid, B12 deficiencies and syphilis.  MRI brain to be ordered.    Lab Results  Component Value Date   TSH 1.29 04/11/2015   Lab Results  Component Value Date   [id number] 04/11/2015   No results found for: RPR

## 2015-06-13 NOTE — Assessment & Plan Note (Signed)
Referral to [facility] for evaluation to rule out melanoma.

## 2015-06-13 NOTE — Assessment & Plan Note (Signed)
I have addressed  BMI and recommended wt loss of 10% of body weight over the next 6 months using a low glycemic index diet and regular exercise a minimum of 5 days per week.   Printed diet given.

## 2015-06-14 ENCOUNTER — Other Ambulatory Visit: Payer: Self-pay | Admitting: Internal Medicine

## 2015-06-14 ENCOUNTER — Ambulatory Visit (INDEPENDENT_AMBULATORY_CARE_PROVIDER_SITE_OTHER): Payer: Medicare Other

## 2015-06-14 DIAGNOSIS — H6123 Impacted cerumen, bilateral: Secondary | ICD-10-CM | POA: Diagnosis not present

## 2015-06-14 DIAGNOSIS — R4189 Other symptoms and signs involving cognitive functions and awareness: Secondary | ICD-10-CM

## 2015-06-14 DIAGNOSIS — L989 Disorder of the skin and subcutaneous tissue, unspecified: Secondary | ICD-10-CM

## 2015-06-14 DIAGNOSIS — R413 Other amnesia: Secondary | ICD-10-CM

## 2015-06-14 NOTE — Progress Notes (Addendum)
Patient came in to have bilateral ears irrigated.  Left ear irrigated with peroxide/water mixture, copious amounts of dark orange, thick wax removed.  Right ear irrigated and same results as the left, two thick dark orange colored wax sections removed.  Bilaterally ears clear with no wax noted when completed.    I have reviewed the above information and agree with above.   Deborra Medina, MD

## 2015-06-22 ENCOUNTER — Telehealth: Payer: Self-pay

## 2015-06-22 NOTE — Telephone Encounter (Signed)
S/w pt who called to let us know he has 11/1 appt for a mole. He does not know if they will biopsy it, remove, or do nothing.  Pt inquiring about whether to stop coumadin. Informed pt to continue all meds as prescribed and if he needs to have mole removed, have the office call us. Pt verbalized understanding.

## 2015-06-22 NOTE — Telephone Encounter (Signed)
Pt is having a mole removed on  tues 11/1. He is on Warfarin, please advise of any changes he needs to make.

## 2015-06-26 ENCOUNTER — Ambulatory Visit
Admission: RE | Admit: 2015-06-26 | Discharge: 2015-06-26 | Disposition: A | Discharge status: Home / Self Care | Payer: Medicare Other | Source: Ambulatory Visit | Attending: Internal Medicine | Admitting: Internal Medicine

## 2015-06-26 DIAGNOSIS — R413 Other amnesia: Secondary | ICD-10-CM | POA: Insufficient documentation

## 2015-06-26 DIAGNOSIS — R4189 Other symptoms and signs involving cognitive functions and awareness: Secondary | ICD-10-CM

## 2015-06-26 DIAGNOSIS — I639 Cerebral infarction, unspecified: Secondary | ICD-10-CM | POA: Insufficient documentation

## 2015-06-26 DIAGNOSIS — I619 Nontraumatic intracerebral hemorrhage, unspecified: Secondary | ICD-10-CM | POA: Insufficient documentation

## 2015-06-26 DIAGNOSIS — G319 Degenerative disease of nervous system, unspecified: Secondary | ICD-10-CM | POA: Insufficient documentation

## 2015-07-04 ENCOUNTER — Ambulatory Visit (INDEPENDENT_AMBULATORY_CARE_PROVIDER_SITE_OTHER): Payer: Medicare Other | Admitting: *Deleted

## 2015-07-04 DIAGNOSIS — Z5181 Encounter for therapeutic drug level monitoring: Secondary | ICD-10-CM | POA: Diagnosis not present

## 2015-07-04 DIAGNOSIS — I4819 Other persistent atrial fibrillation: Secondary | ICD-10-CM

## 2015-07-04 DIAGNOSIS — I481 Persistent atrial fibrillation: Secondary | ICD-10-CM

## 2015-07-04 LAB — POCT INR: INR: 2.7

## 2015-07-12 ENCOUNTER — Other Ambulatory Visit: Payer: Self-pay | Admitting: Cardiovascular Disease

## 2015-07-17 DIAGNOSIS — X32XXXA Exposure to sunlight, initial encounter: Secondary | ICD-10-CM | POA: Diagnosis not present

## 2015-07-17 DIAGNOSIS — L570 Actinic keratosis: Secondary | ICD-10-CM | POA: Diagnosis not present

## 2015-07-17 DIAGNOSIS — L718 Other rosacea: Secondary | ICD-10-CM | POA: Diagnosis not present

## 2015-07-17 DIAGNOSIS — D485 Neoplasm of uncertain behavior of skin: Secondary | ICD-10-CM | POA: Diagnosis not present

## 2015-07-17 DIAGNOSIS — L821 Other seborrheic keratosis: Secondary | ICD-10-CM | POA: Diagnosis not present

## 2015-07-18 ENCOUNTER — Telehealth: Payer: Self-pay | Admitting: *Deleted

## 2015-07-30 ENCOUNTER — Encounter: Payer: Self-pay | Admitting: Internal Medicine

## 2015-07-30 ENCOUNTER — Ambulatory Visit (INDEPENDENT_AMBULATORY_CARE_PROVIDER_SITE_OTHER): Payer: Medicare Other | Admitting: Internal Medicine

## 2015-07-30 VITALS — BP 126/78 | HR 55 | Temp 97.8°F | Resp 12 | Ht 67.0 in | Wt 207.5 lb

## 2015-07-30 DIAGNOSIS — E785 Hyperlipidemia, unspecified: Secondary | ICD-10-CM | POA: Diagnosis not present

## 2015-07-30 DIAGNOSIS — F819 Developmental disorder of scholastic skills, unspecified: Secondary | ICD-10-CM

## 2015-07-30 DIAGNOSIS — E669 Obesity, unspecified: Secondary | ICD-10-CM

## 2015-07-30 DIAGNOSIS — I2583 Coronary atherosclerosis due to lipid rich plaque: Principal | ICD-10-CM

## 2015-07-30 DIAGNOSIS — I2510 Atherosclerotic heart disease of native coronary artery without angina pectoris: Secondary | ICD-10-CM

## 2015-07-30 DIAGNOSIS — I10 Essential (primary) hypertension: Secondary | ICD-10-CM

## 2015-07-30 DIAGNOSIS — E559 Vitamin D deficiency, unspecified: Secondary | ICD-10-CM

## 2015-07-30 DIAGNOSIS — R5383 Other fatigue: Secondary | ICD-10-CM

## 2015-07-30 DIAGNOSIS — Z8679 Personal history of other diseases of the circulatory system: Secondary | ICD-10-CM

## 2015-07-30 DIAGNOSIS — Z0000 Encounter for general adult medical examination without abnormal findings: Secondary | ICD-10-CM | POA: Diagnosis not present

## 2015-07-30 DIAGNOSIS — G4733 Obstructive sleep apnea (adult) (pediatric): Secondary | ICD-10-CM

## 2015-07-30 DIAGNOSIS — D696 Thrombocytopenia, unspecified: Secondary | ICD-10-CM

## 2015-07-30 DIAGNOSIS — Z1159 Encounter for screening for other viral diseases: Secondary | ICD-10-CM

## 2015-07-30 NOTE — Patient Instructions (Signed)
Your cholesterol was fine In August..  Please return in February for fasting labs,  And I will see you again in June    I will have the office call you with an appointment  for your free wellness visit with Jeffery Reynolds,, our health coach .  Brig your Living Will with you for that visit   Health Maintenance, Male A healthy lifestyle and preventative care can promote health and wellness.  Maintain regular health, dental, and eye exams.  Eat a healthy diet. Foods like vegetables, fruits, whole grains, low-fat dairy products, and lean protein foods contain the nutrients you need and are low in calories. Decrease your intake of foods high in solid fats, added sugars, and salt. Get information about a proper diet from your health care provider, if necessary.  Regular physical exercise is one of the most important things you can do for your health. Most adults should get at least 150 minutes of moderate-intensity exercise (any activity that increases your heart rate and causes you to sweat) each week. In addition, most adults need muscle-strengthening exercises on 2 or more days a week.   Maintain a healthy weight. The body mass index (BMI) is a screening tool to identify possible weight problems. It provides an estimate of body fat based on height and weight. Your health care provider can find your BMI and can help you achieve or maintain a healthy weight. For males 20 years and older:  A BMI below 18.5 is considered underweight.  A BMI of 18.5 to 24.9 is normal.  A BMI of 25 to 29.9 is considered overweight.  A BMI of 30 and above is considered obese.  Maintain normal blood lipids and cholesterol by exercising and minimizing your intake of saturated fat. Eat a balanced diet with plenty of fruits and vegetables. Blood tests for lipids and cholesterol should begin at age 53 and be repeated every 5 years. If your lipid or cholesterol levels are high, you are over age 51, or you are at high risk  for heart disease, you may need your cholesterol levels checked more frequently.Ongoing high lipid and cholesterol levels should be treated with medicines if diet and exercise are not working.  If you smoke, find out from your health care provider how to quit. If you do not use tobacco, do not start.  Lung cancer screening is recommended for adults aged 37-80 years who are at high risk for developing lung cancer because of a history of smoking. A yearly low-dose CT scan of the lungs is recommended for people who have at least a 30-pack-year history of smoking and are current smokers or have quit within the past 15 years. A pack year of smoking is smoking an average of 1 pack of cigarettes a day for 1 year (for example, a 30-pack-year history of smoking could mean smoking 1 pack a day for 30 years or 2 packs a day for 15 years). Yearly screening should continue until the smoker has stopped smoking for at least 15 years. Yearly screening should be stopped for people who develop a health problem that would prevent them from having lung cancer treatment.  If you choose to drink alcohol, do not have more than 2 drinks per day. One drink is considered to be 12 oz (360 mL) of beer, 5 oz (150 mL) of wine, or 1.5 oz (45 mL) of liquor.  Avoid the use of street drugs. Do not share needles with anyone. Ask for help if you need  support or instructions about stopping the use of drugs.  High blood pressure causes heart disease and increases the risk of stroke. High blood pressure is more likely to develop in:  People who have blood pressure in the end of the normal range (100-139/85-89 mm Hg).  People who are overweight or obese.  People who are African American.  If you are 69-28 years of age, have your blood pressure checked every 3-5 years. If you are 29 years of age or older, have your blood pressure checked every year. You should have your blood pressure measured twice--once when you are at a hospital or  clinic, and once when you are not at a hospital or clinic. Record the average of the two measurements. To check your blood pressure when you are not at a hospital or clinic, you can use:  An automated blood pressure machine at a pharmacy.  A home blood pressure monitor.  If you are 33-36 years old, ask your health care provider if you should take aspirin to prevent heart disease.  Diabetes screening involves taking a blood sample to check your fasting blood sugar level. This should be done once every 3 years after age 5 if you are at a normal weight and without risk factors for diabetes. Testing should be considered at a younger age or be carried out more frequently if you are overweight and have at least 1 risk factor for diabetes.  Colorectal cancer can be detected and often prevented. Most routine colorectal cancer screening begins at the age of 76 and continues through age 75. However, your health care provider may recommend screening at an earlier age if you have risk factors for colon cancer. On a yearly basis, your health care provider may provide home test kits to check for hidden blood in the stool. A small camera at the end of a tube may be used to directly examine the colon (sigmoidoscopy or colonoscopy) to detect the earliest forms of colorectal cancer. Talk to your health care provider about this at age 20 when routine screening begins. A direct exam of the colon should be repeated every 5-10 years through age 58, unless early forms of precancerous polyps or small growths are found.  People who are at an increased risk for hepatitis B should be screened for this virus. You are considered at high risk for hepatitis B if:  You were born in a country where hepatitis B occurs often. Talk with your health care provider about which countries are considered high risk.  Your parents were born in a high-risk country and you have not received a shot to protect against hepatitis B (hepatitis B  vaccine).  You have HIV or AIDS.  You use needles to inject street drugs.  You live with, or have sex with, someone who has hepatitis B.  You are a man who has sex with other men (MSM).  You get hemodialysis treatment.  You take certain medicines for conditions like cancer, organ transplantation, and autoimmune conditions.  Hepatitis C blood testing is recommended for all people born from [birth year] through 1963 and any individual with known risk factors for hepatitis C.  Healthy men should no longer receive prostate-specific antigen (PSA) blood tests as part of routine cancer screening. Talk to your health care provider about prostate cancer screening.  Testicular cancer screening is not recommended for adolescents or adult males who have no symptoms. Screening includes self-exam, a health care provider exam, and other screening tests. Consult with your  health care provider about any symptoms you have or any concerns you have about testicular cancer.  Practice safe sex. Use condoms and avoid high-risk sexual practices to reduce the spread of sexually transmitted infections (STIs).  You should be screened for STIs, including gonorrhea and chlamydia if:  You are sexually active and are younger than 24 years.  You are older than 24 years, and your health care provider tells you that you are at risk for this type of infection.  Your sexual activity has changed since you were last screened, and you are at an increased risk for chlamydia or gonorrhea. Ask your health care provider if you are at risk.  If you are at risk of being infected with HIV, it is recommended that you take a prescription medicine daily to prevent HIV infection. This is called pre-exposure prophylaxis (PrEP). You are considered at risk if:  You are a man who has sex with other men (MSM).  You are a heterosexual man who is sexually active with multiple partners.  You take drugs by injection.  You are sexually active  with a partner who has HIV.  Talk with your health care provider about whether you are at high risk of being infected with HIV. If you choose to begin PrEP, you should first be tested for HIV. You should then be tested every 3 months for as long as you are taking PrEP.  Use sunscreen. Apply sunscreen liberally and repeatedly throughout the day. You should seek shade when your shadow is shorter than you. Protect yourself by wearing long sleeves, pants, a wide-brimmed hat, and sunglasses year round whenever you are outdoors.  Tell your health care provider of new moles or changes in moles, especially if there is a change in shape or color. Also, tell your health care provider if a mole is larger than the size of a pencil eraser.  A one-time screening for abdominal aortic aneurysm (AAA) and surgical repair of large AAAs by ultrasound is recommended for men aged 73-75 years who are current or former smokers.  Stay current with your vaccines (immunizations).   This information is not intended to replace advice given to you by your health care provider. Make sure you discuss any questions you have with your health care provider.   Document Released: 02/07/2008 Document Revised: 09/01/2014 Document Reviewed: 01/06/2011 Elsevier Interactive Patient Education [employer].

## 2015-07-30 NOTE — Progress Notes (Signed)
Subjective:  Patient ID: Jeffery Reynolds, male    DOB: 1933/09/20  Age: 81 y.o. MRN: [id number]  CC: The primary encounter diagnosis was Coronary artery disease due to lipid rich plaque. 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  HPI KARTEL WOHLWEND presents for annual  physical exam and follow up on cignitive difficulties,  Recent MRI of brain.   MRI brain results discussed with patient in detail.  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  He recalls that he had difficutly with any complicated math  And was never an "A student"   Has not had any progression ,  Still can balance checkbook,  Does not miss appointments or leave stove on,  Has not gotten lost.   Has annual eye exams.  Defers haring test  For now.    Using trazodone to sleep .  Not sexually acive in 2 years. Uses CPAP for OSA  Last study was done  in [place] 3 years ago   Has not been checked and needs to order  supplies .  Doesnot use humidified chamber .  States that he is wearing  it 6 to 8 hours per night,  7 nights per week     Exercises regularly 5 days per week .         Outpatient Prescriptions Prior to Visit  Medication Sig Dispense Refill  . allopurinol (ZYLOPRIM) 300 MG tablet Take on Monday, Wednesday and Friday. 30 tablet 6  .  ALPRAZolam (XANAX) 0.25 MG tablet TAKE 1 TABLET BY MOUTH EVERY NIGHT AT BEDTIME 30 tablet 5  . HYDROcodone-acetaminophen (NORCO/VICODIN) 5-325 MG per tablet Take 1 tablet twice a day as needed. 60 tablet 0  . metoprolol (LOPRESSOR) 50 MG tablet Take 1 tablet (50 mg total) by mouth 2 (two) times daily. 60 tablet 5  . sildenafil (VIAGRA) 100 MG tablet Take 100 mg by mouth daily as needed for erectile dysfunction.    . traMADol (ULTRAM) 50 MG tablet TAKE 1 TABLET BY MOUTH EVERY DAY AT SUPPER 30 tablet 5  . traZODone (DESYREL) 150 MG tablet Take 1 tablet (150 mg total) by mouth at bedtime. 30 tablet 6  . warfarin (COUMADIN) 5 MG tablet TAKE AS DIRECTED BY ANTICOAGULATION CLINIC 30 tablet 2  . simvastatin (ZOCOR) 80 MG tablet TAKE 1 TABLET BY MOUTH EVERY DAY 30 tablet 6  . Omeprazole Magnesium (PRILOSEC OTC PO) Take by mouth daily.     No facility-administered medications prior to visit.    Review of Systems;  Patient denies headache, fevers, malaise, unintentional weight loss, skin rash, eye pain, sinus congestion and sinus pain, sore throat, dysphagia,  hemoptysis , cough, dyspnea, wheezing, chest pain, palpitations, orthopnea, edema, abdominal pain, nausea, melena, diarrhea, constipation, flank pain, dysuria, hematuria, urinary  Frequency, nocturia, numbness, tingling, seizures,  Focal weakness, Loss of consciousness,  Tremor, insomnia, depression, anxiety, and suicidal ideation.  Objective:  BP 126/78 mmHg  Pulse 55  Temp(Src) 97.8 F (36.6 C) (Oral)  Resp 12  Ht 5\' 7"  (1.702 m)  Wt 207 lb 8 oz (94.121 kg)  BMI 32.49 kg/m2  SpO2 97%  BP Readings from Last 3 Encounters:  07/30/15 126/78  06/12/15 122/72  04/11/15 106/62    Wt Readings from Last 3 Encounters:  07/30/15 207 lb 8 oz (94.121 kg)  06/12/15 203 lb 6 oz (92.25 kg)  04/11/15 205 lb (92.987 kg)    General appearance: alert, cooperative and appears stated age Ears: normal TM's and external ear canals both  ears Throat: lips, mucosa, and tongue normal; teeth and gums normal Neck: no adenopathy, no carotid bruit, supple, symmetrical, trachea midline and thyroid not enlarged, symmetric, no tenderness/mass/nodules Back: symmetric, no curvature. ROM normal. No CVA tenderness. Lungs: clear to auscultation bilaterally Heart: regular rate and rhythm, S1, S2 normal, no murmur, click, rub or gallop Abdomen: soft, non-tender; bowel sounds normal; no masses,  no organomegaly Pulses: 2+ and symmetric Skin: Skin color, texture, turgor normal. No rashes or lesions Lymph nodes: Cervical, supraclavicular, and axillary nodes normal.  Lab Results  Component Value Date   HGBA1C 5.1 04/18/2014    Lab Results  Component Value Date   CREATININE 1.24 04/11/2015   CREATININE 1.1 08/14/2014   CREATININE 1.01 03/24/2014    Lab Results  Component Value Date   WBC 3.5* 04/11/2015   HGB 13.9 04/11/2015   HCT 41.4 04/11/2015   PLT 83.0* 04/11/2015   GLUCOSE 105* 04/11/2015   CHOL 126 04/11/2015   TRIG 172.0* 04/11/2015   HDL 39.20 04/11/2015   LDLCALC 53 04/11/2015   ALT 26 04/11/2015   AST 21 04/11/2015   NA 143 04/11/2015   K 5.0 04/11/2015   CL 106 04/11/2015   CREATININE 1.24 04/11/2015   BUN 18 04/11/2015   CO2 33* 04/11/2015   TSH 1.29 04/11/2015   INR 2.7 07/04/2015   HGBA1C 5.1 04/18/2014    Mr Brain Wo Contrast  06/26/2015  CLINICAL DATA:  Short term memory loss.  Cognitive decline. EXAM: MRI HEAD WITHOUT CONTRAST TECHNIQUE: Multiplanar, multiecho pulse sequences of the brain and surrounding structures were obtained without intravenous contrast. COMPARISON:  None. FINDINGS: Moderate generalized cerebral atrophy. Mild ventricular enlargement consistent with atrophy. Chronic infarct left frontal lobe. No other significant chronic ischemia Negative for acute infarct. Chronic micro hemorrhage right paracentral pons and left corona radiata. No fluid collection. Negative for mass or edema.  Paranasal sinuses are clear. Normal orbit. Pituitary normal in size. IMPRESSION: Moderate atrophy. Chronic left frontal infarct. Chronic micro hemorrhage. No acute abnormality. Electronically Signed   By: Franchot Gallo M.D.   On: 06/26/2015 10:45    Assessment & Plan:   Problem List Items Addressed This Visit    Hyperlipidemia    changing simvastatin to atorvastatin per guidelines .  Lab Results  Component Value Date   CHOL 126 04/11/2015   HDL 39.20 04/11/2015   LDLCALC 53 04/11/2015   TRIG 172.0* 04/11/2015   CHOLHDL 3 04/11/2015         Relevant Medications   atorvastatin (LIPITOR) 40 MG tablet   Other Relevant Orders   LDL cholesterol, direct   Lipid panel   Medicare annual wellness visit, subsequent    Will be scheduled with health coach      Thrombocytopenia ([place])     Stable by surveillance.  No BM biopsy done unless they are < 50 K.   Lab  Results  Component Value Date   WBC 3.5* 04/11/2015   HGB 13.9 04/11/2015   HCT 41.4 04/11/2015   MCV 98.1 04/11/2015   PLT 83.0* 04/11/2015         Obstructive sleep apnea    Diagnosed by sleep study. 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  Old infarct involving left frontal lobe discussed with patient . Old head injury from high school vs silent CVA later on ,  chronoclogy unclear.    Takes warfarin for mitigation of stroke risk now given atrial fib.  Will advise to consider statin therapy, if tolerated,       Obesity    I have addressed  BMI and recommended wt loss of 10% of body weight over the next 6 months using a low glycemic index diet and regular exercise a minimum of 5 days per week.        History of cerebrovascular disease    by recent MRI,  Left frontal infarct noted  Chronology/etiology unclear  .  Will need to obtain carotid dopplers to assess fully,       Relevant Orders   US Carotid Bilateral   Encounter for preventive health examination    Annual comprehensive preventive exam was done as well as an evaluation and management of chronic conditions .  During the course of the visit the patient was educated and counseled about appropriate screening and preventive services including :  diabetes screening, lipid analysis,nutrition counseling, colorectal cancer screening, and recommended immunizations.  Printed recommendations for health maintenance screenings was given.       Coronary artery disease - Primary    S/p cabg   X 6 1995.  Sees Arida      Relevant Medications   atorvastatin (LIPITOR) 40 MG tablet   Essential hypertension   Relevant Medications   atorvastatin (LIPITOR) 40 MG tablet   Other Relevant Orders   Comprehensive metabolic panel    Other Visit Diagnoses    Vitamin D deficiency        Relevant Orders    VITAMIN D 25 Hydroxy (Vit-D Deficiency, Fractures)    Other fatigue        Relevant Orders    TSH    CBC with Differential/Platelet    Need for hepatitis C screening test        Relevant Orders    Hepatitis C antibody      A total of 40 minutes was spent with patient more than half of which was spent in counseling patient on the above mentioned issues , reviewing and explaining recent labs and imaging studies done, and coordination of care.  I have discontinued Mr. Sholar simvastatin. I am also having him start on atorvastatin. Additionally, I am having him maintain his sildenafil, Omeprazole Magnesium (PRILOSEC OTC PO), metoprolol, HYDROcodone-acetaminophen, ALPRAZolam, traZODone, traMADol, allopurinol, and warfarin.  Meds ordered this encounter  Medications  . atorvastatin (LIPITOR) 40 MG tablet    Sig: Take 1 tablet (40 mg total) by mouth daily.    Dispense:  90 tablet    Refill:  1    Please fill instead of simvastatin when due     Medications Discontinued During This Encounter  Medication Reason  . simvastatin (ZOCOR) 80 MG tablet Discontinued by  provider    Follow-up: Return in about 6 months (around 01/28/2016).   Crecencio Mc, MD

## 2015-07-30 NOTE — Assessment & Plan Note (Signed)
S/p cabg   X 6 1995.  Sees Fletcher Anon

## 2015-07-30 NOTE — Progress Notes (Signed)
Pre-visit discussion using our clinic review tool. No additional management support is needed unless otherwise documented below in the visit note.  

## 2015-07-31 ENCOUNTER — Encounter: Payer: Self-pay | Admitting: Internal Medicine

## 2015-07-31 DIAGNOSIS — Z8679 Personal history of other diseases of the circulatory system: Secondary | ICD-10-CM | POA: Insufficient documentation

## 2015-07-31 DIAGNOSIS — Z0000 Encounter for general adult medical examination without abnormal findings: Secondary | ICD-10-CM | POA: Insufficient documentation

## 2015-07-31 MED ORDER — ATORVASTATIN CALCIUM 40 MG PO TABS: 40.0000 mg | ORAL_TABLET | Freq: Every day | ORAL | Status: DC

## 2015-07-31 NOTE — Assessment & Plan Note (Signed)
Will be scheduled with health coach

## 2015-07-31 NOTE — Assessment & Plan Note (Signed)
Diagnosed by sleep study. he is wearing his CPAP every night a minimum of 6 hours per night and notes improved daytime wakefulness and decreased fatigue  

## 2015-07-31 NOTE — Assessment & Plan Note (Signed)
Stable by surveillance.  No BM biopsy done unless they are < 50 K.   Lab Results  Component Value Date   WBC 3.5* 04/11/2015   HGB 13.9 04/11/2015   HCT 41.4 04/11/2015   MCV 98.1 04/11/2015   PLT 83.0* 04/11/2015

## 2015-07-31 NOTE — Assessment & Plan Note (Addendum)
changing simvastatin to atorvastatin per guidelines .  Lab Results  Component Value Date   CHOL 126 04/11/2015   HDL 39.20 04/11/2015   LDLCALC 53 04/11/2015   TRIG 172.0* 04/11/2015   CHOLHDL 3 04/11/2015

## 2015-07-31 NOTE — Assessment & Plan Note (Addendum)
investigated with MRI brain and serologies for reversible causes.  Old infarct involving left frontal lobe discussed with patient . Old head injury from high school vs silent CVA later on ,  chronoclogy unclear.    Takes warfarin for mitigation of stroke risk now given atrial fib.  Will advise to consider statin therapy, if tolerated,

## 2015-07-31 NOTE — Assessment & Plan Note (Addendum)
Annual comprehensive preventive exam was done as well as an evaluation and management of chronic conditions .  During the course of the visit the patient was educated and counseled about appropriate screening and preventive services including :  diabetes screening, lipid analysis, nutrition counseling, colorectal cancer screening, and recommended immunizations.  Printed recommendations for health maintenance screenings was given.   

## 2015-07-31 NOTE — Assessment & Plan Note (Signed)
I have addressed  BMI and recommended wt loss of 10% of body weight over the next 6 months using a low glycemic index diet and regular exercise a minimum of 5 days per week.   

## 2015-07-31 NOTE — Assessment & Plan Note (Signed)
by recent MRI,  Left frontal infarct noted  Chronology/etiology unclear .  Will need to obtain carotid dopplers to assess fully,

## 2015-08-01 ENCOUNTER — Ambulatory Visit (INDEPENDENT_AMBULATORY_CARE_PROVIDER_SITE_OTHER): Payer: Medicare Other

## 2015-08-01 DIAGNOSIS — I4819 Other persistent atrial fibrillation: Secondary | ICD-10-CM

## 2015-08-01 DIAGNOSIS — I481 Persistent atrial fibrillation: Secondary | ICD-10-CM | POA: Diagnosis not present

## 2015-08-01 DIAGNOSIS — Z5181 Encounter for therapeutic drug level monitoring: Secondary | ICD-10-CM | POA: Diagnosis not present

## 2015-08-01 LAB — POCT INR: INR: 2.7

## 2015-08-16 ENCOUNTER — Other Ambulatory Visit: Payer: Self-pay | Admitting: Cardiovascular Disease

## 2015-08-22 ENCOUNTER — Other Ambulatory Visit: Payer: Self-pay | Admitting: Nurse Practitioner

## 2015-08-23 ENCOUNTER — Other Ambulatory Visit: Payer: Self-pay | Admitting: Internal Medicine

## 2015-08-23 ENCOUNTER — Ambulatory Visit: Payer: Medicare Other

## 2015-08-23 DIAGNOSIS — F09 Unspecified mental disorder due to known physiological condition: Secondary | ICD-10-CM | POA: Diagnosis not present

## 2015-08-23 DIAGNOSIS — S0990XA Unspecified injury of head, initial encounter: Secondary | ICD-10-CM

## 2015-08-23 DIAGNOSIS — Z8679 Personal history of other diseases of the circulatory system: Secondary | ICD-10-CM

## 2015-09-11 ENCOUNTER — Ambulatory Visit (INDEPENDENT_AMBULATORY_CARE_PROVIDER_SITE_OTHER): Payer: Medicare Other

## 2015-09-11 VITALS — BP 116/68 | HR 68 | Temp 98.4°F | Resp 14 | Ht 67.0 in | Wt 213.4 lb

## 2015-09-11 DIAGNOSIS — Z1159 Encounter for screening for other viral diseases: Secondary | ICD-10-CM

## 2015-09-11 DIAGNOSIS — E559 Vitamin D deficiency, unspecified: Secondary | ICD-10-CM | POA: Diagnosis not present

## 2015-09-11 DIAGNOSIS — E785 Hyperlipidemia, unspecified: Secondary | ICD-10-CM

## 2015-09-11 DIAGNOSIS — M25512 Pain in left shoulder: Secondary | ICD-10-CM

## 2015-09-11 DIAGNOSIS — Z0000 Encounter for general adult medical examination without abnormal findings: Secondary | ICD-10-CM | POA: Diagnosis not present

## 2015-09-11 DIAGNOSIS — I10 Essential (primary) hypertension: Secondary | ICD-10-CM

## 2015-09-11 DIAGNOSIS — R5383 Other fatigue: Secondary | ICD-10-CM

## 2015-09-11 LAB — CBC WITH DIFFERENTIAL/PLATELET
Basophils Absolute: 0 10*3/uL (ref 0.0–0.1)
Basophils Relative: 0.4 % (ref 0.0–3.0)
Eosinophils Absolute: 0.1 10*3/uL (ref 0.0–0.7)
Eosinophils Relative: 3.1 % (ref 0.0–5.0)
HCT: 41.1 % (ref 39.0–52.0)
Hemoglobin: 13.7 g/dL (ref 13.0–17.0)
Lymphocytes Relative: 23.9 % (ref 12.0–46.0)
Lymphs Abs: 0.9 10*3/uL (ref 0.7–4.0)
MCHC: 33.2 g/dL (ref 30.0–36.0)
MCV: 95.9 fl (ref 78.0–100.0)
Monocytes Absolute: 0.4 10*3/uL (ref 0.1–1.0)
Monocytes Relative: 11.4 % (ref 3.0–12.0)
Neutro Abs: 2.2 10*3/uL (ref 1.4–7.7)
Neutrophils Relative %: 61.2 % (ref 43.0–77.0)
Platelets: 89 10*3/uL — ABNORMAL LOW (ref 150.0–400.0)
RBC: 4.29 Mil/uL (ref 4.22–5.81)
RDW: 14.2 % (ref 11.5–15.5)
WBC: 3.6 10*3/uL — ABNORMAL LOW (ref 4.0–10.5)

## 2015-09-11 LAB — LIPID PANEL
Cholesterol: 96 mg/dL (ref 0–200)
HDL: 35.8 mg/dL — ABNORMAL LOW (ref >39.00)
LDL Cholesterol: 38 mg/dL (ref 0–99)
NonHDL: 59.88
Total CHOL/HDL Ratio: 3
Triglycerides: 107 mg/dL (ref 0.0–149.0)
VLDL: 21.4 mg/dL (ref 0.0–40.0)

## 2015-09-11 LAB — COMPREHENSIVE METABOLIC PANEL
ALT: 29 U/L (ref 0–53)
AST: 23 U/L (ref 0–37)
Albumin: 3.9 g/dL (ref 3.5–5.2)
Alkaline Phosphatase: 80 U/L (ref 39–117)
BUN: 16 mg/dL (ref 6–23)
CO2: 31 mEq/L (ref 19–32)
Calcium: 8.4 mg/dL (ref 8.4–10.5)
Chloride: 107 mEq/L (ref 96–112)
Creatinine, Ser: 1.11 mg/dL (ref 0.40–1.50)
GFR: 67.74 mL/min (ref >60.00)
Glucose, Bld: 100 mg/dL — ABNORMAL HIGH (ref 70–99)
Potassium: 4.4 mEq/L (ref 3.5–5.1)
Sodium: 143 mEq/L (ref 135–145)
Total Bilirubin: 1.1 mg/dL (ref 0.2–1.2)
Total Protein: 5.9 g/dL — ABNORMAL LOW (ref 6.0–8.3)

## 2015-09-11 LAB — VITAMIN D 25 HYDROXY (VIT D DEFICIENCY, FRACTURES): VITD: 32.15 ng/mL (ref 30.00–100.00)

## 2015-09-11 LAB — TSH: TSH: 2.43 u[IU]/mL (ref 0.35–4.50)

## 2015-09-11 LAB — LDL CHOLESTEROL, DIRECT: Direct LDL: 45 mg/dL

## 2015-09-11 NOTE — Progress Notes (Signed)
Subjective:   Jeffery Reynolds is a 82 y.o. male who presents for Medicare Annual/Subsequent preventive examination.  Review of Systems:  No ROS.  Medicare Wellness Visit. Cardiac Risk Factors include: hypertension;male gender     Objective:    Vitals: BP 116/68 mmHg  Pulse 68  Temp(Src) 98.4 F (36.9 C) (Oral)  Resp 14  Ht 5\' 7"  (1.702 m)  Wt 213 lb 6.4 oz (96.798 kg)  BMI 33.42 kg/m2  SpO2 98%  Tobacco History  Smoking status  . Never Smoker   Smokeless tobacco  . Not on file     Counseling given: Not Answered   Past Medical History  Diagnosis Date  . A-fib ([place]) 2000       . Gout     Controlled with Allopurinol  . Hyperlipidemia   . Hypertension   . Arthritis   . History of chicken pox   . GERD (gastroesophageal reflux disease)   . Genital warts   . Seasonal allergies   . Colon polyps   . History of blood transfusion   . CAD (coronary artery disease)   . 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 Sleep apnea, obstructive    Past Surgical History  Procedure Laterality Date  . Coronary artery bypass graft  1995    CABG x 6 in [place], [place]  . Knee surgery      bilateral   . Rotator cuff repair      right  . Gallbladder surgery    . Cardiac catheterization    . Tonsillectomy and adenoidectomy  1943  . Gynecomastia excision     Family History  Problem Relation Age of Onset  . Heart disease Father   . Heart attack Father 39    MI  . Alcohol abuse Father   . Hyperlipidemia Father   . Hypertension Father   . Heart attack Paternal Grandmother 68  . Heart attack Paternal Grandfather 80  . Cancer Mother     Breast cancer and Lung Cancer  . Diabetes Brother   . Dementia Brother   . Crohn's disease Daughter   . Cancer Brother     lung cancer  . Alcohol abuse Son    History  Sexual Activity  . Sexual Activity: Not Currently    Outpatient Encounter Prescriptions as of 09/11/2015  Medication Sig  . allopurinol (ZYLOPRIM) 300 MG tablet Take on Monday,  Wednesday and Friday.  . ALPRAZolam (XANAX) 0.25 MG tablet TAKE 1 TABLET BY MOUTH EVERY NIGHT AT BEDTIME  . atorvastatin (LIPITOR) 40 MG tablet Take 1 tablet (40 mg total) by mouth daily.  Marland Kitchen HYDROcodone-acetaminophen (NORCO/VICODIN) 5-325 MG per tablet Take 1 tablet twice a day as needed.  . metoprolol (LOPRESSOR) 50 MG tablet TAKE 1 TABLET(50 MG) BY MOUTH TWICE DAILY  . Omeprazole Magnesium (PRILOSEC OTC PO) Take by mouth daily.  . sildenafil (VIAGRA) 100 MG tablet Take 100 mg by mouth daily as needed for erectile dysfunction.  . traMADol (ULTRAM) 50 MG tablet TAKE 1 TABLET BY MOUTH EVERY DAY AT SUPPER  . traZODone (DESYREL) 150 MG tablet Take 1 tablet (150 mg total) by mouth at bedtime.  Marland Kitchen warfarin (COUMADIN) 5 MG tablet TAKE AS DIRECTED BY ANTICOAGULATION CLINIC   No facility-administered encounter medications on file as of 09/11/2015.    Activities of Daily Living In your present state of health, do you have any difficulty performing the following activities: 09/11/2015  Hearing? N  Vision? N  Difficulty concentrating or making decisions? N  Walking or climbing stairs? Y  Dressing or bathing? N  Doing errands, shopping? N  Preparing Food and eating ? N  Using the Toilet? N  In the past six months, have you accidently leaked urine? N  Do you have problems with loss of bowel control? N  Managing your Medications? N  Managing your Finances? N  Housekeeping or managing your Housekeeping? N    Patient Care Team: Crecencio Mc, MD as PCP - General (Internal Medicine)   Assessment:   This is a routine wellness examination for Jeffery Reynolds. The goal of the wellness visit is to assist the patient how to close the gaps in care and create a preventative care plan for the patient.   Osteoporosis risk reviewed.  Medications reviewed; taking without issues or barriers.  Safety issues reviewed; smoke detectors in the home. No firearms in the home. Wears seatbelts when driving or riding  with others. No violence in the home.  The patient was oriented x 3; appropriate in dress and manner and no objective failures at ADL's or IADL's.   Thrombocytopenia-stable and followed by PCP Atrial fibrillation-stable and followed by Dr. Fletcher Anon Chronic atrial fibrillation-stable and followed by Dr. Fletcher Anon Persistent atrial fibrillation-stable and followed by Dr. Fletcher Anon  Patient Concerns:  Dry eyes x 72months with itch, redness, burning sensation; using OTC drops, but not working.  Requests Rx drops. Deferred to follow up with PCP.  Exercise Activities and Dietary recommendations Current Exercise Habits:: Structured exercise class, Frequency (Times/Week): 3, Intensity: Moderate  Goals    . Healthy Lifestyle     Continue exercise regiment Stay hydrated, drink plenty of water Eat a healthy diet, choose lean meats, fruits and vegetables      Fall Risk Fall Risk  09/11/2015 07/30/2015 04/11/2015 08/14/2014 08/14/2014  Falls in the past year? No No No No No   Depression Screen PHQ 2/9 Scores 09/11/2015 07/30/2015 04/11/2015 08/14/2014  PHQ - 2 Score 0 0 0 0    Cognitive Testing MMSE - Mini Mental State Exam 09/11/2015  Orientation to time 5  Orientation to Place 5  Registration 3  Attention/ Calculation 5  Recall 3  Language- name 2 objects 2  Language- repeat 1  Language- follow 3 step command 3  Language- read & follow direction 1  Write a sentence 1  Copy design 1  Total score 30    Immunization History  Administered Date(s) Administered  . Influenza,inj,Quad PF,36+ Mos 06/10/2014  . Influenza-Unspecified 06/30/2015  . Pneumococcal Conjugate-13 08/10/2013  . Pneumococcal Polysaccharide-23 08/14/2014  . Tdap 11/12/2013  . Zoster 04/19/2014   Screening Tests Health Maintenance  Topic Date Due  . INFLUENZA VACCINE  03/25/2016  . TETANUS/TDAP  11/13/2023  . ZOSTAVAX  Completed  . PNA vac Low Risk Adult  Completed      Plan:   End of life planning; Advance aging;  Advanced directives discussed. Copy requested of current HCPOA/Living Will.   During the course of the visit the patient was educated and counseled about the following appropriate screening and preventive services:   Vaccines to include Pneumoccal, Influenza, Hepatitis B, Td, Zostavax, HCV  Electrocardiogram  Cardiovascular Disease  Colorectal cancer screening  Diabetes screening  Prostate Cancer Screening  Glaucoma screening  Nutrition counseling   Smoking cessation counseling  Patient Instructions (the written plan) was given to the patient.    Varney Biles, LPN  [phone]

## 2015-09-11 NOTE — Patient Instructions (Addendum)
Jeffery Reynolds,  Thank you for taking time to come for your Medicare Wellness Visit.  I appreciate your ongoing commitment to your health goals. Please review the following plan we discussed and let me know if I can assist you in the future.   Health Maintenance, Male A healthy lifestyle and preventative care can promote health and wellness.  Maintain regular health, dental, and eye exams.  Eat a healthy diet. Foods like vegetables, fruits, whole grains, low-fat dairy products, and lean protein foods contain the nutrients you need and are low in calories. Decrease your intake of foods high in solid fats, added sugars, and salt. Get information about a proper diet from your health care provider, if necessary.  Regular physical exercise is one of the most important things you can do for your health. Most adults should get at least 150 minutes of moderate-intensity exercise (any activity that increases your heart rate and causes you to sweat) each week. In addition, most adults need muscle-strengthening exercises on 2 or more days a week.   Maintain a healthy weight. The body mass index (BMI) is a screening tool to identify possible weight problems. It provides an estimate of body fat based on height and weight. Your health care provider can find your BMI and can help you achieve or maintain a healthy weight. For males 20 years and older:  A BMI below 18.5 is considered underweight.  A BMI of 18.5 to 24.9 is normal.  A BMI of 25 to 29.9 is considered overweight.  A BMI of 30 and above is considered obese.  Maintain normal blood lipids and cholesterol by exercising and minimizing your intake of saturated fat. Eat a balanced diet with plenty of fruits and vegetables. Blood tests for lipids and cholesterol should begin at age 39 and be repeated every 5 years. If your lipid or cholesterol levels are high, you are over age 65, or you are at high risk for heart disease, you may need your cholesterol  levels checked more frequently.Ongoing high lipid and cholesterol levels should be treated with medicines if diet and exercise are not working.  If you smoke, find out from your health care provider how to quit. If you do not use tobacco, do not start.  Lung cancer screening is recommended for adults aged 37-80 years who are at high risk for developing lung cancer because of a history of smoking. A yearly low-dose CT scan of the lungs is recommended for people who have at least a 30-pack-year history of smoking and are current smokers or have quit within the past 15 years. A pack year of smoking is smoking an average of 1 pack of cigarettes a day for 1 year (for example, a 30-pack-year history of smoking could mean smoking 1 pack a day for 30 years or 2 packs a day for 15 years). Yearly screening should continue until the smoker has stopped smoking for at least 15 years. Yearly screening should be stopped for people who develop a health problem that would prevent them from having lung cancer treatment.  If you choose to drink alcohol, do not have more than 2 drinks per day. One drink is considered to be 12 oz (360 mL) of beer, 5 oz (150 mL) of wine, or 1.5 oz (45 mL) of liquor.  Avoid the use of street drugs. Do not share needles with anyone. Ask for help if you need support or instructions about stopping the use of drugs.  High blood pressure causes heart  disease and increases the risk of stroke. High blood pressure is more likely to develop in:  People who have blood pressure in the end of the normal range (100-139/85-89 mm Hg).  People who are overweight or obese.  People who are African American.  If you are 74-26 years of age, have your blood pressure checked every 3-5 years. If you are 33 years of age or older, have your blood pressure checked every year. You should have your blood pressure measured twice--once when you are at a hospital or clinic, and once when you are not at a hospital or  clinic. Record the average of the two measurements. To check your blood pressure when you are not at a hospital or clinic, you can use:  An automated blood pressure machine at a pharmacy.  A home blood pressure monitor.  If you are 86-59 years old, ask your health care provider if you should take aspirin to prevent heart disease.  Diabetes screening involves taking a blood sample to check your fasting blood sugar level. This should be done once every 3 years after age 85 if you are at a normal weight and without risk factors for diabetes. Testing should be considered at a younger age or be carried out more frequently if you are overweight and have at least 1 risk factor for diabetes.  Colorectal cancer can be detected and often prevented. Most routine colorectal cancer screening begins at the age of 29 and continues through age 87. However, your health care provider may recommend screening at an earlier age if you have risk factors for colon cancer. On a yearly basis, your health care provider may provide home test kits to check for hidden blood in the stool. A small camera at the end of a tube may be used to directly examine the colon (sigmoidoscopy or colonoscopy) to detect the earliest forms of colorectal cancer. Talk to your health care provider about this at age 2 when routine screening begins. A direct exam of the colon should be repeated every 5-10 years through age 37, unless early forms of precancerous polyps or small growths are found.  People who are at an increased risk for hepatitis B should be screened for this virus. You are considered at high risk for hepatitis B if:  You were born in a country where hepatitis B occurs often. Talk with your health care provider about which countries are considered high risk.  Your parents were born in a high-risk country and you have not received a shot to protect against hepatitis B (hepatitis B vaccine).  You have HIV or AIDS.  You use needles  to inject street drugs.  You live with, or have sex with, someone who has hepatitis B.  You are a man who has sex with other men (MSM).  You get hemodialysis treatment.  You take certain medicines for conditions like cancer, organ transplantation, and autoimmune conditions.  Hepatitis C blood testing is recommended for all people born from [birth year] through 1963 and any individual with known risk factors for hepatitis C.  Healthy men should no longer receive prostate-specific antigen (PSA) blood tests as part of routine cancer screening. Talk to your health care provider about prostate cancer screening.  Testicular cancer screening is not recommended for adolescents or adult males who have no symptoms. Screening includes self-exam, a health care provider exam, and other screening tests. Consult with your health care provider about any symptoms you have or any concerns you have about testicular  cancer.  Practice safe sex. Use condoms and avoid high-risk sexual practices to reduce the spread of sexually transmitted infections (STIs).  You should be screened for STIs, including gonorrhea and chlamydia if:  You are sexually active and are younger than 24 years.  You are older than 24 years, and your health care provider tells you that you are at risk for this type of infection.  Your sexual activity has changed since you were last screened, and you are at an increased risk for chlamydia or gonorrhea. Ask your health care provider if you are at risk.  If you are at risk of being infected with HIV, it is recommended that you take a prescription medicine daily to prevent HIV infection. This is called pre-exposure prophylaxis (PrEP). You are considered at risk if:  You are a man who has sex with other men (MSM).  You are a heterosexual man who is sexually active with multiple partners.  You take drugs by injection.  You are sexually active with a partner who has HIV.  Talk with your health  care provider about whether you are at high risk of being infected with HIV. If you choose to begin PrEP, you should first be tested for HIV. You should then be tested every 3 months for as long as you are taking PrEP.  Use sunscreen. Apply sunscreen liberally and repeatedly throughout the day. You should seek shade when your shadow is shorter than you. Protect yourself by wearing long sleeves, pants, a wide-brimmed hat, and sunglasses year round whenever you are outdoors.  Tell your health care provider of new moles or changes in moles, especially if there is a change in shape or color. Also, tell your health care provider if a mole is larger than the size of a pencil eraser.  A one-time screening for abdominal aortic aneurysm (AAA) and surgical repair of large AAAs by ultrasound is recommended for men aged 43-75 years who are current or former smokers.  Stay current with your vaccines (immunizations).   This information is not intended to replace advice given to you by your health care provider. Make sure you discuss any questions you have with your health care provider.   Document Released: 02/07/2008 Document Revised: 09/01/2014 Document Reviewed: 01/06/2011 Elsevier Interactive Patient Education [employer].

## 2015-09-11 NOTE — Progress Notes (Signed)
  I have reviewed the above information and agree with above.   Allaina Brotzman, MD 

## 2015-09-12 LAB — HEPATITIS C ANTIBODY: HCV Ab: NEGATIVE

## 2015-09-14 ENCOUNTER — Ambulatory Visit (INDEPENDENT_AMBULATORY_CARE_PROVIDER_SITE_OTHER): Payer: Medicare Other

## 2015-09-14 ENCOUNTER — Encounter: Payer: Self-pay | Admitting: Cardiovascular Disease

## 2015-09-14 ENCOUNTER — Ambulatory Visit (INDEPENDENT_AMBULATORY_CARE_PROVIDER_SITE_OTHER): Payer: Medicare Other | Admitting: Cardiovascular Disease

## 2015-09-14 VITALS — BP 108/60 | HR 75 | Ht 67.5 in | Wt 209.5 lb

## 2015-09-14 DIAGNOSIS — I481 Persistent atrial fibrillation: Secondary | ICD-10-CM | POA: Diagnosis not present

## 2015-09-14 DIAGNOSIS — Z5181 Encounter for therapeutic drug level monitoring: Secondary | ICD-10-CM | POA: Diagnosis not present

## 2015-09-14 DIAGNOSIS — I10 Essential (primary) hypertension: Secondary | ICD-10-CM | POA: Diagnosis not present

## 2015-09-14 DIAGNOSIS — I482 Chronic atrial fibrillation, unspecified: Secondary | ICD-10-CM

## 2015-09-14 DIAGNOSIS — I2510 Atherosclerotic heart disease of native coronary artery without angina pectoris: Secondary | ICD-10-CM

## 2015-09-14 DIAGNOSIS — E785 Hyperlipidemia, unspecified: Secondary | ICD-10-CM | POA: Diagnosis not present

## 2015-09-14 DIAGNOSIS — I4819 Other persistent atrial fibrillation: Secondary | ICD-10-CM

## 2015-09-14 LAB — POCT INR: INR: 3.7

## 2015-09-14 NOTE — Progress Notes (Signed)
HPI  This is a pleasant 82 year old man who is here today for a followup visit.  He has prolonged cardiac history. He underwent 6 vessel CABG in 1995 in [place]. He was diagnosed later with atrial fibrillation and was started on anticoagulation with warfarin.  He reports no recurrent ischemic events since his CABG. He has known history of hypertension, hyperlipidemia and previous tobacco use. There is family history of coronary artery disease. He is retired from [employer] (medical).   the patient transitioned into chronic atrial fibrillation but overall was asymptomatic. Thus, I switched him from sotalol to metoprolol during last visit.  He has been doing very well and denies any chest pain or shortness of breath. He goes to the gym 3 times a week with no significant exertional symptoms. He has some mild memory loss. Recent carotid Doppler showed mild nonobstructive atherosclerosis.    No Known Allergies   Current Outpatient Prescriptions on File Prior to Visit  Medication Sig Dispense Refill  . allopurinol (ZYLOPRIM) 300 MG tablet Take on Monday, Wednesday and Friday. 30 tablet 6  . ALPRAZolam (XANAX) 0.25 MG tablet TAKE 1 TABLET BY MOUTH EVERY NIGHT AT BEDTIME 30 tablet 5  . atorvastatin (LIPITOR) 40 MG tablet Take 1 tablet (40 mg total) by mouth daily. 90 tablet 1  . HYDROcodone-acetaminophen (NORCO/VICODIN) 5-325 MG per tablet Take 1 tablet twice a day as needed. 60 tablet 0  . metoprolol (LOPRESSOR) 50 MG tablet TAKE 1 TABLET(50 MG) BY MOUTH TWICE DAILY 60 tablet 2  . Omeprazole Magnesium (PRILOSEC OTC PO) Take by mouth daily.    . sildenafil (VIAGRA) 100 MG tablet Take 100 mg by mouth daily as needed for erectile dysfunction.    . traMADol (ULTRAM) 50 MG tablet TAKE 1 TABLET BY MOUTH EVERY DAY AT SUPPER 30 tablet 5  . traZODone (DESYREL) 150 MG tablet Take 1 tablet (150 mg total) by mouth at bedtime. 30 tablet 6  . warfarin (COUMADIN) 5 MG tablet TAKE AS DIRECTED BY ANTICOAGULATION CLINIC 30 tablet 2    No current facility-administered medications on file prior to visit.     Past Medical History  Diagnosis Date  . A-fib ([place]) 2000       . Gout     Controlled with Allopurinol  . Hyperlipidemia   . Hypertension   . Arthritis   . History of chicken pox   . GERD (gastroesophageal reflux disease)   . Genital warts   . Seasonal allergies   . Colon polyps   . History of blood transfusion   . CAD (coronary artery disease)   . 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 Sleep apnea, obstructive      Past Surgical History  Procedure Laterality Date  . Coronary artery bypass graft  1995    CABG x 6 in [place], [place]  . Knee surgery      bilateral   . Rotator cuff repair      right  . Gallbladder surgery    . Cardiac catheterization    . Tonsillectomy and adenoidectomy  1943  . Gynecomastia excision       Family History  Problem Relation Age of Onset  . Heart disease Father   . Heart attack Father 62    MI  . Alcohol abuse Father   . Hyperlipidemia Father   . Hypertension Father   . Heart attack Paternal Grandmother 64  . Heart attack Paternal Grandfather 85  . Cancer Mother     Breast cancer and Lung  Cancer  . Diabetes Brother   . Dementia Brother   . Crohn's disease Daughter   . Cancer Brother     lung cancer  . Alcohol abuse Son      Social History   Social History  . Marital Status: Divorced    Spouse Name: N/A  . Number of Children: 7  . Years of Education: 12   Occupational History  . [employer] - [place]     Retired 1995   Social History Main Topics  . Smoking status: Never Smoker   . Smokeless tobacco: Not on file  . Alcohol Use: Yes     Comment: occasional   . Drug Use: No  . Sexual Activity: Not Currently   Other Topics Concern  . Not on file   Social History Narrative   Mr. Mumper grew up in [place], [place]. Most recently he was living in [place]. He moved to the [place] area since March 2015. Recently divorced and moved to this  area to be close to one of his sons. He enjoys reading and has been going to the gym to exercise.              ROS A 10 point review of system was performed. It is negative other than that mentioned in the history of present illness.   PHYSICAL EXAM   BP 108/60 mmHg  Pulse 75  Ht 5' 7.5" (1.715 m)  Wt 209 lb 8 oz (95.029 kg)  BMI 32.31 kg/m2 Constitutional: He is oriented to person, place, and time. He appears well-developed and well-nourished. No distress.  HENT: No nasal discharge.  Head: Normocephalic and atraumatic.  Eyes: Pupils are equal and round.  No discharge. Neck: Normal range of motion. Neck supple. No JVD present. No thyromegaly present.  Cardiovascular: Normal rate, irregular rhythm, normal heart sounds. Exam reveals no gallop and no friction rub. No murmur heard.  Pulmonary/Chest: Effort normal and breath sounds normal. No stridor. No respiratory distress. He has no wheezes. He has no rales. He exhibits no tenderness.  Abdominal: Soft. Bowel sounds are normal. He exhibits no distension. There is no tenderness. There is no rebound and no guarding.  Musculoskeletal: Normal range of motion. He exhibits no edema and no tenderness.  Neurological: He is alert and oriented to person, place, and time. Coordination normal.  Skin: Skin is warm and dry. No rash noted. He is not diaphoretic. No erythema. No pallor.  Psychiatric: He has a normal mood and affect. His behavior is normal. Judgment and thought content normal.     EKG: Atrial fibrillation ABNORMAL RHYTHM  ASSESSMENT AND PLAN

## 2015-09-14 NOTE — Assessment & Plan Note (Signed)
He is doing very well overall and appears to be asymptomatic. Into her rate is well controlled on metoprolol. He is on anticoagulation with warfarin. He does have chronic thrombocytopenia but this has been stable. Most recent platelet count was 89,000.

## 2015-09-14 NOTE — Assessment & Plan Note (Signed)
He was switched recently from high-dose simvastatin to atorvastatin. Lab Results  Component Value Date   CHOL 96 09/11/2015   HDL 35.80* 09/11/2015   LDLCALC 38 09/11/2015   LDLDIRECT 45.0 09/11/2015   TRIG 107.0 09/11/2015   CHOLHDL 3 09/11/2015   Lipid profile was optimal. Continue same medication.

## 2015-09-14 NOTE — Assessment & Plan Note (Signed)
Blood pressure is well-controlled on metoprolol. 

## 2015-09-14 NOTE — Patient Instructions (Signed)
Medication Instructions: Continue same medications.   Labwork: None.   Procedures/Testing: None.   Follow-Up: 1 year follow up with Dr. Angeldejesus Callaham.   Any Additional Special Instructions Will Be Listed Below (If Applicable).     If you need a refill on your cardiac medications before your next appointment, please call your pharmacy.   

## 2015-09-14 NOTE — Assessment & Plan Note (Signed)
He has no symptoms of angina. He is very active and exercises regularly. Continue medical therapy.

## 2015-09-17 ENCOUNTER — Encounter: Payer: Self-pay | Admitting: *Deleted

## 2015-09-18 ENCOUNTER — Other Ambulatory Visit: Payer: Self-pay | Admitting: Cardiovascular Disease

## 2015-09-19 ENCOUNTER — Telehealth: Payer: Self-pay | Admitting: Internal Medicine

## 2015-09-19 MED ORDER — OLOPATADINE HCL 0.1 % OP SOLN: 1.0000 [drp] | Freq: Two times a day (BID) | OPHTHALMIC | Status: DC

## 2015-09-19 NOTE — Telephone Encounter (Signed)
-----   Message from [name], LPN sent at [id number]  8:29 AM EST ----- Regarding: Rx FOR DRY EYES Saw patient on 1.17.17 for AWV  Stated he had dry itchy eyes for about 2 months and was using OTC drops that didn't seem to be working well.  He wants to know if you can send in a Rx to the pharmacy?  Please advise and I will call him back.  Thanks, Denisa

## 2015-09-19 NOTE — Telephone Encounter (Signed)
Patient notified

## 2015-09-19 NOTE — Telephone Encounter (Signed)
Eyedrops for itchy eyes sent to pharmacy

## 2015-09-21 ENCOUNTER — Telehealth: Payer: Self-pay

## 2015-09-21 NOTE — Telephone Encounter (Signed)
PA for Olopatadine 0.1% opth soln completed on Cover My Meds, pending, results in 24 hours.

## 2015-09-25 ENCOUNTER — Telehealth: Payer: Self-pay | Admitting: Internal Medicine

## 2015-09-25 MED ORDER — AZELASTINE HCL 0.05 % OP SOLN: 1.0000 [drp] | Freq: Two times a day (BID) | OPHTHALMIC | Status: DC

## 2015-09-25 NOTE — Telephone Encounter (Signed)
[employer] denied coverage of Olopatadine eye drops stating one of the formulary approved must be tried first or notes stating other have tried and failed,such as azelastine, epinastine or pazeo. Please advise

## 2015-09-25 NOTE — Telephone Encounter (Signed)
Azelastine sent to [facility]

## 2015-09-26 NOTE — Telephone Encounter (Signed)
Patient notified

## 2015-10-03 ENCOUNTER — Ambulatory Visit (INDEPENDENT_AMBULATORY_CARE_PROVIDER_SITE_OTHER): Payer: Medicare Other

## 2015-10-03 DIAGNOSIS — Z5181 Encounter for therapeutic drug level monitoring: Secondary | ICD-10-CM | POA: Diagnosis not present

## 2015-10-03 DIAGNOSIS — I481 Persistent atrial fibrillation: Secondary | ICD-10-CM

## 2015-10-03 DIAGNOSIS — I4819 Other persistent atrial fibrillation: Secondary | ICD-10-CM

## 2015-10-03 LAB — POCT INR: INR: 2

## 2015-10-05 ENCOUNTER — Telehealth: Payer: Self-pay | Admitting: Internal Medicine

## 2015-10-05 NOTE — Telephone Encounter (Signed)
error 

## 2015-10-07 ENCOUNTER — Other Ambulatory Visit: Payer: Self-pay | Admitting: Cardiovascular Disease

## 2015-10-07 ENCOUNTER — Other Ambulatory Visit: Payer: Self-pay | Admitting: Internal Medicine

## 2015-10-08 NOTE — Telephone Encounter (Signed)
Please review for refill, Thank you. 

## 2015-10-08 NOTE — Telephone Encounter (Signed)
Pt is requesting refill on xanax, pt last ov 07/30/15, last filled 04/16/15 #30tabs with 5 refills. Please advise, thanks

## 2015-10-09 NOTE — Telephone Encounter (Signed)
printed

## 2015-10-11 ENCOUNTER — Other Ambulatory Visit: Payer: Self-pay

## 2015-10-11 MED ORDER — HYDROCODONE-ACETAMINOPHEN 5-325 MG PO TABS: ORAL_TABLET | ORAL | Status: DC

## 2015-10-11 NOTE — Telephone Encounter (Signed)
Ok to refill,  Refill sprinted

## 2015-10-11 NOTE — Telephone Encounter (Signed)
Patient walked into office.  Requesting a refill on his Norco.  He brought the old prescription from August 2016 with him.  He tried to fill it today and because it is from August the pharmacy would not take it.  Please advise for refill.  Thanks.

## 2015-10-11 NOTE — Telephone Encounter (Signed)
Patient called and placed upfront for pick up by patient.

## 2015-10-15 ENCOUNTER — Other Ambulatory Visit: Payer: Self-pay

## 2015-10-20 ENCOUNTER — Telehealth: Payer: Self-pay | Admitting: *Deleted

## 2015-10-20 DIAGNOSIS — Z7901 Long term (current) use of anticoagulants: Secondary | ICD-10-CM

## 2015-10-20 DIAGNOSIS — E785 Hyperlipidemia, unspecified: Secondary | ICD-10-CM

## 2015-10-20 DIAGNOSIS — M1000 Idiopathic gout, unspecified site: Secondary | ICD-10-CM

## 2015-10-20 NOTE — Telephone Encounter (Signed)
Pt coming in for fasting labs on Monday. Need lab orders placed.

## 2015-10-22 ENCOUNTER — Other Ambulatory Visit: Payer: Self-pay

## 2015-10-24 ENCOUNTER — Ambulatory Visit (INDEPENDENT_AMBULATORY_CARE_PROVIDER_SITE_OTHER): Payer: Medicare Other | Admitting: Nurse Practitioner

## 2015-10-24 ENCOUNTER — Encounter: Payer: Self-pay | Admitting: Nurse Practitioner

## 2015-10-24 VITALS — BP 112/84 | HR 63 | Temp 98.3°F | Ht 67.5 in | Wt 208.2 lb

## 2015-10-24 DIAGNOSIS — I2510 Atherosclerotic heart disease of native coronary artery without angina pectoris: Secondary | ICD-10-CM | POA: Diagnosis not present

## 2015-10-24 DIAGNOSIS — J069 Acute upper respiratory infection, unspecified: Secondary | ICD-10-CM

## 2015-10-24 DIAGNOSIS — B9789 Other viral agents as the cause of diseases classified elsewhere: Principal | ICD-10-CM

## 2015-10-24 MED ORDER — FLUTICASONE PROPIONATE 50 MCG/ACT NA SUSP: 2.0000 | Freq: Every day | NASAL | Status: DC

## 2015-10-24 NOTE — Progress Notes (Signed)
Patient ID: Jeffery Reynolds, male    DOB: 07-24-1934  Age: 82 y.o. MRN: [id number]  CC: Acute Visit   HPI Jeffery Reynolds presents for CC of Rhinorrhea/cough x 1 week.  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   He has past surgical history that includes Coronary artery bypass graft (1995); Knee surgery; Rotator cuff repair; Gallbladder surgery; Cardiac catheterization; Tonsillectomy and adenoidectomy (1943); and Gynecomastia excision.   His family history includes Alcohol abuse in his father and son; Cancer in his brother and mother; Crohn's disease in his daughter; Dementia in his brother; Diabetes in his brother; Heart attack (age of onset: 72) in his father; Heart attack (age of onset: 65) in his paternal grandfather; Heart attack (age of onset: 54) in his paternal grandmother; Heart disease in his father; Hyperlipidemia in his father; Hypertension in his father.He reports that he has never smoked. He does not have any smokeless tobacco history on file. He reports that he drinks alcohol. He reports that he does not use illicit drugs.  Outpatient Prescriptions Prior to Visit  Medication Sig Dispense Refill  . allopurinol (ZYLOPRIM) 300 MG tablet Take on Monday, Wednesday and Friday. 30 tablet 6  . ALPRAZolam (XANAX) 0.25 MG tablet TAKE 1 TABLET BY MOUTH EVERY NIGHT AT BEDTIME 30 tablet 3  . atorvastatin (LIPITOR) 40 MG tablet Take 1 tablet (40 mg total) by mouth daily. 90 tablet 1  .  azelastine (OPTIVAR) 0.05 % ophthalmic solution Place 1 drop into both eyes 2 (two) times daily. 6 mL 5  . HYDROcodone-acetaminophen (NORCO/VICODIN) 5-325 MG tablet Take 1 tablet twice a day as needed. 60 tablet 0  . metoprolol (LOPRESSOR) 50 MG tablet TAKE 1 TABLET(50 MG) BY MOUTH TWICE DAILY 60 tablet 2  . metoprolol (LOPRESSOR) 50 MG tablet TAKE 1 TABLET(50 MG) BY MOUTH TWICE DAILY 60 tablet 6  . Omeprazole Magnesium (PRILOSEC OTC PO) Take by mouth daily.    . sildenafil (VIAGRA) 100 MG tablet Take 100 mg by mouth daily as needed for erectile dysfunction.    . traMADol (ULTRAM) 50 MG tablet TAKE 1 TABLET BY MOUTH EVERY DAY AT SUPPER 30 tablet 5  . traZODone (DESYREL) 150 MG tablet Take 1 tablet (150 mg total) by mouth at bedtime. 30 tablet 6  . warfarin (COUMADIN) 5 MG tablet TAKE AS DIRECTED BY ANTICOAGULATION CLINIC 30 tablet 3   No facility-administered medications prior to visit.    ROS Review of Systems  Constitutional: Negative for fever, chills, diaphoresis and fatigue.  HENT: Positive for postnasal drip and rhinorrhea. Negative for congestion, ear pain, sinus pressure, sneezing, sore throat, trouble swallowing and voice change.   Respiratory: Positive for cough. Negative for chest tightness, shortness of breath and wheezing.   Gastrointestinal: Negative for nausea, vomiting and diarrhea.  Musculoskeletal: Negative for myalgias.  Skin: Negative for rash.  Neurological: Negative for dizziness and headaches.    Objective:  BP 112/84 mmHg  Pulse 63  Temp(Src) 98.3 F (36.8 C) (Oral)  Ht 5' 7.5" (1.715 m)  Wt 208 lb 4 oz (94.462 kg)  BMI 32.12 kg/m2  SpO2 95%  Physical Exam  Constitutional: He is oriented to person, place, and time. He appears well-developed and well-nourished. No distress.  HENT:  Head: Normocephalic and atraumatic.  Right Ear: External ear normal.  Left Ear: External ear normal.  Nose: Nose normal.  Mouth/Throat: Oropharynx is clear and moist. No  oropharyngeal exudate.  Tm's clear bilaterally  Eyes: EOM are normal. Pupils are equal, round, and reactive to light. Right eye exhibits no discharge. Left eye exhibits no discharge. No scleral icterus.  Neck: Normal range of motion. Neck supple.  Cardiovascular: Normal rate, regular rhythm and normal heart sounds.  Exam reveals no gallop and no friction rub.   No murmur heard. Pulmonary/Chest: Effort normal and breath sounds normal. No respiratory distress. He has no wheezes. He has no rales. He exhibits no tenderness.  Lymphadenopathy:    He has no cervical adenopathy.  Neurological: He is alert and oriented to person, place, and time.  Skin: Skin is warm and dry. No rash noted. He is not diaphoretic.  Psychiatric: He has a normal mood and affect. His behavior is normal. Judgment and thought content normal.   Assessment & Plan:   [name] was seen today for acute visit.  Diagnoses and all orders for this visit:  Viral URI with cough  Other orders -     fluticasone (FLONASE) 50 MCG/ACT nasal spray; Place 2 sprays into both nostrils daily.  I am having [name] start on fluticasone. I am also having him maintain his sildenafil, Omeprazole Magnesium (PRILOSEC OTC PO), traZODone, traMADol, allopurinol, atorvastatin, metoprolol, metoprolol, azelastine, ALPRAZolam, warfarin, and HYDROcodone-acetaminophen.  Meds ordered this encounter  Medications  . fluticasone (FLONASE) 50 MCG/ACT nasal spray    Sig: Place 2 sprays into both nostrils daily.    Dispense:  16 g    Refill:  6    Order Specific Question:  Supervising Provider    Answer:  Crecencio Mc [[id number]]     Follow-up: Return if symptoms worsen or fail to improve.

## 2015-10-24 NOTE — Progress Notes (Signed)
Pre visit review using our clinic review tool, if applicable. No additional management support is needed unless otherwise documented below in the visit note. 

## 2015-10-24 NOTE — Patient Instructions (Signed)
Mucinex plain over the counter  Flonase two sprays in each nostril daily   Lots of fluids and let us know if you run a fever (100.5 or greater)

## 2015-10-28 DIAGNOSIS — J069 Acute upper respiratory infection, unspecified: Secondary | ICD-10-CM | POA: Insufficient documentation

## 2015-10-28 DIAGNOSIS — B9789 Other viral agents as the cause of diseases classified elsewhere: Principal | ICD-10-CM

## 2015-10-28 NOTE — Assessment & Plan Note (Addendum)
New onset Will treat conservatively due to probable viral nature Flonase sent to pharmacy  Mucinex plain OTC  FU prn worsening/failure to improve.

## 2015-10-31 ENCOUNTER — Ambulatory Visit (INDEPENDENT_AMBULATORY_CARE_PROVIDER_SITE_OTHER): Payer: Medicare Other

## 2015-10-31 DIAGNOSIS — Z5181 Encounter for therapeutic drug level monitoring: Secondary | ICD-10-CM

## 2015-10-31 DIAGNOSIS — I481 Persistent atrial fibrillation: Secondary | ICD-10-CM | POA: Diagnosis not present

## 2015-10-31 DIAGNOSIS — I4819 Other persistent atrial fibrillation: Secondary | ICD-10-CM

## 2015-10-31 LAB — POCT INR: INR: 2.3

## 2015-11-24 ENCOUNTER — Other Ambulatory Visit: Payer: Self-pay | Admitting: Internal Medicine

## 2015-11-28 ENCOUNTER — Telehealth: Payer: Self-pay | Admitting: Internal Medicine

## 2015-11-28 NOTE — Telephone Encounter (Signed)
Pt needs refill on HYDROcodone-acetaminophen (NORCO/VICODIN) 5-325 MG.. Please advise pt when this is ready to be picked up

## 2015-11-28 NOTE — Telephone Encounter (Signed)
Last refill was 10/11/15, for #60.  Please advise refill? Thanks

## 2015-11-29 MED ORDER — HYDROCODONE-ACETAMINOPHEN 5-325 MG PO TABS: ORAL_TABLET | ORAL | Status: DC

## 2015-11-29 NOTE — Telephone Encounter (Signed)
Refilled for 2 months.  Needs OV in June

## 2015-11-29 NOTE — Telephone Encounter (Signed)
Pt called back to follow up on the Rx. Call pt @ [phone]. Thank you!

## 2015-11-30 NOTE — Telephone Encounter (Signed)
Gave to [place] this morning

## 2015-12-05 ENCOUNTER — Ambulatory Visit (INDEPENDENT_AMBULATORY_CARE_PROVIDER_SITE_OTHER): Payer: Medicare Other

## 2015-12-05 DIAGNOSIS — Z5181 Encounter for therapeutic drug level monitoring: Secondary | ICD-10-CM

## 2015-12-05 DIAGNOSIS — I4819 Other persistent atrial fibrillation: Secondary | ICD-10-CM

## 2015-12-05 DIAGNOSIS — I481 Persistent atrial fibrillation: Secondary | ICD-10-CM

## 2015-12-05 LAB — POCT INR: INR: 2.6

## 2015-12-14 ENCOUNTER — Other Ambulatory Visit: Payer: Self-pay | Admitting: Surgical

## 2015-12-14 MED ORDER — TRAMADOL HCL 50 MG PO TABS: ORAL_TABLET | ORAL | Status: DC

## 2015-12-14 NOTE — Telephone Encounter (Signed)
Patient requesting refill of Tramadol . Last office visit with you on 07/30/15

## 2015-12-14 NOTE — Telephone Encounter (Signed)
Patient has appointment June 6th

## 2015-12-14 NOTE — Telephone Encounter (Signed)
REFILLED,  NEEDS APPT IN June

## 2015-12-26 DIAGNOSIS — M4806 Spinal stenosis, lumbar region: Secondary | ICD-10-CM | POA: Diagnosis not present

## 2015-12-26 DIAGNOSIS — M25562 Pain in left knee: Secondary | ICD-10-CM | POA: Diagnosis not present

## 2015-12-26 DIAGNOSIS — Z96659 Presence of unspecified artificial knee joint: Secondary | ICD-10-CM | POA: Diagnosis not present

## 2015-12-26 DIAGNOSIS — Z96652 Presence of left artificial knee joint: Secondary | ICD-10-CM | POA: Diagnosis not present

## 2016-01-02 DIAGNOSIS — M4806 Spinal stenosis, lumbar region: Secondary | ICD-10-CM | POA: Diagnosis not present

## 2016-01-09 ENCOUNTER — Ambulatory Visit (INDEPENDENT_AMBULATORY_CARE_PROVIDER_SITE_OTHER): Payer: Medicare Other

## 2016-01-09 ENCOUNTER — Other Ambulatory Visit: Payer: Self-pay | Admitting: Pharmacist Clinician (PhC)/ Clinical Pharmacy Specialist

## 2016-01-09 ENCOUNTER — Other Ambulatory Visit: Payer: Self-pay | Admitting: *Deleted

## 2016-01-09 ENCOUNTER — Other Ambulatory Visit: Payer: Self-pay

## 2016-01-09 DIAGNOSIS — I481 Persistent atrial fibrillation: Secondary | ICD-10-CM | POA: Diagnosis not present

## 2016-01-09 DIAGNOSIS — Z5181 Encounter for therapeutic drug level monitoring: Secondary | ICD-10-CM

## 2016-01-09 DIAGNOSIS — I4819 Other persistent atrial fibrillation: Secondary | ICD-10-CM

## 2016-01-09 LAB — POCT INR: INR: 2

## 2016-01-09 MED ORDER — TRAZODONE HCL 150 MG PO TABS: ORAL_TABLET | ORAL | Status: DC

## 2016-01-09 MED ORDER — ALLOPURINOL 300 MG PO TABS: ORAL_TABLET | ORAL | Status: DC

## 2016-01-09 MED ORDER — METOPROLOL TARTRATE 50 MG PO TABS: ORAL_TABLET | ORAL | Status: DC

## 2016-01-09 MED ORDER — WARFARIN SODIUM 5 MG PO TABS: ORAL_TABLET | ORAL | Status: DC

## 2016-01-09 MED ORDER — ATORVASTATIN CALCIUM 40 MG PO TABS: 40.0000 mg | ORAL_TABLET | Freq: Every day | ORAL | Status: DC

## 2016-01-10 ENCOUNTER — Other Ambulatory Visit: Payer: Self-pay | Admitting: *Deleted

## 2016-01-10 ENCOUNTER — Other Ambulatory Visit: Payer: Self-pay

## 2016-01-10 MED ORDER — TRAMADOL HCL 50 MG PO TABS: ORAL_TABLET | ORAL | Status: DC

## 2016-01-10 MED ORDER — ALPRAZOLAM 0.25 MG PO TABS: 0.2500 mg | ORAL_TABLET | Freq: Every day | ORAL | Status: DC

## 2016-01-10 NOTE — Telephone Encounter (Signed)
Patient is requesting a refill of traMADol (ULTRAM) 50 MG tablet to go to his mail order pharmacy [facility].  Phone number is [phone] and fax is [phone]. His last refill of traMADol (ULTRAM) 50 MG tablet was 12/14/15 at [facility]. Last office visit was 10/24/15. Okay to fill?

## 2016-01-10 NOTE — Telephone Encounter (Signed)
I do not refill controlled substances for 90 days,  Only 30 days .  It will be sent to his local pharmacy

## 2016-01-10 NOTE — Telephone Encounter (Signed)
A paper refill denial was faxed.

## 2016-01-11 ENCOUNTER — Other Ambulatory Visit: Payer: Self-pay

## 2016-01-11 MED ORDER — METOPROLOL TARTRATE 50 MG PO TABS: ORAL_TABLET | ORAL | Status: DC

## 2016-01-11 MED ORDER — WARFARIN SODIUM 5 MG PO TABS: ORAL_TABLET | ORAL | Status: DC

## 2016-01-11 NOTE — Telephone Encounter (Signed)
Refill sent for metoprolol tart 50 mg  

## 2016-01-11 NOTE — Telephone Encounter (Signed)
Please review for refill on warfarin. Thanks!  

## 2016-01-23 DIAGNOSIS — M5136 Other intervertebral disc degeneration, lumbar region: Secondary | ICD-10-CM | POA: Diagnosis not present

## 2016-01-23 DIAGNOSIS — Z96652 Presence of left artificial knee joint: Secondary | ICD-10-CM | POA: Diagnosis not present

## 2016-01-23 DIAGNOSIS — M4806 Spinal stenosis, lumbar region: Secondary | ICD-10-CM | POA: Diagnosis not present

## 2016-01-23 DIAGNOSIS — D696 Thrombocytopenia, unspecified: Secondary | ICD-10-CM | POA: Diagnosis not present

## 2016-01-24 DIAGNOSIS — M25562 Pain in left knee: Secondary | ICD-10-CM | POA: Diagnosis not present

## 2016-01-24 DIAGNOSIS — M25662 Stiffness of left knee, not elsewhere classified: Secondary | ICD-10-CM | POA: Diagnosis not present

## 2016-01-28 DIAGNOSIS — M5416 Radiculopathy, lumbar region: Secondary | ICD-10-CM | POA: Diagnosis not present

## 2016-01-28 DIAGNOSIS — M25562 Pain in left knee: Secondary | ICD-10-CM | POA: Diagnosis not present

## 2016-01-29 DIAGNOSIS — Z96659 Presence of unspecified artificial knee joint: Secondary | ICD-10-CM | POA: Diagnosis not present

## 2016-01-29 DIAGNOSIS — M4806 Spinal stenosis, lumbar region: Secondary | ICD-10-CM | POA: Diagnosis not present

## 2016-01-29 DIAGNOSIS — M47817 Spondylosis without myelopathy or radiculopathy, lumbosacral region: Secondary | ICD-10-CM | POA: Diagnosis not present

## 2016-01-30 ENCOUNTER — Encounter: Payer: Self-pay | Admitting: Internal Medicine

## 2016-01-30 ENCOUNTER — Ambulatory Visit (INDEPENDENT_AMBULATORY_CARE_PROVIDER_SITE_OTHER): Payer: Medicare Other | Admitting: Internal Medicine

## 2016-01-30 ENCOUNTER — Telehealth: Payer: Self-pay | Admitting: Internal Medicine

## 2016-01-30 VITALS — BP 106/68 | HR 68 | Temp 98.7°F | Resp 16 | Ht 67.5 in | Wt 205.0 lb

## 2016-01-30 DIAGNOSIS — I10 Essential (primary) hypertension: Secondary | ICD-10-CM

## 2016-01-30 DIAGNOSIS — I2510 Atherosclerotic heart disease of native coronary artery without angina pectoris: Secondary | ICD-10-CM

## 2016-01-30 DIAGNOSIS — I4819 Other persistent atrial fibrillation: Secondary | ICD-10-CM

## 2016-01-30 DIAGNOSIS — E785 Hyperlipidemia, unspecified: Secondary | ICD-10-CM

## 2016-01-30 DIAGNOSIS — M1000 Idiopathic gout, unspecified site: Secondary | ICD-10-CM

## 2016-01-30 DIAGNOSIS — Z01818 Encounter for other preprocedural examination: Secondary | ICD-10-CM

## 2016-01-30 DIAGNOSIS — I878 Other specified disorders of veins: Secondary | ICD-10-CM

## 2016-01-30 DIAGNOSIS — Z7901 Long term (current) use of anticoagulants: Secondary | ICD-10-CM | POA: Diagnosis not present

## 2016-01-30 DIAGNOSIS — I481 Persistent atrial fibrillation: Secondary | ICD-10-CM

## 2016-01-30 DIAGNOSIS — Z8639 Personal history of other endocrine, nutritional and metabolic disease: Secondary | ICD-10-CM

## 2016-01-30 DIAGNOSIS — Z8739 Personal history of other diseases of the musculoskeletal system and connective tissue: Secondary | ICD-10-CM

## 2016-01-30 LAB — COMPREHENSIVE METABOLIC PANEL
ALT: 39 U/L (ref 0–53)
AST: 27 U/L (ref 0–37)
Albumin: 4.1 g/dL (ref 3.5–5.2)
Alkaline Phosphatase: 86 U/L (ref 39–117)
BUN: 22 mg/dL (ref 6–23)
CO2: 27 mEq/L (ref 19–32)
Calcium: 8.8 mg/dL (ref 8.4–10.5)
Chloride: 108 mEq/L (ref 96–112)
Creatinine, Ser: 0.99 mg/dL (ref 0.40–1.50)
GFR: 77.22 mL/min (ref >60.00)
Glucose, Bld: 104 mg/dL — ABNORMAL HIGH (ref 70–99)
Potassium: 4.4 mEq/L (ref 3.5–5.1)
Sodium: 142 mEq/L (ref 135–145)
Total Bilirubin: 1.1 mg/dL (ref 0.2–1.2)
Total Protein: 6.1 g/dL (ref 6.0–8.3)

## 2016-01-30 LAB — CBC WITH DIFFERENTIAL/PLATELET
Basophils Absolute: 0 10*3/uL (ref 0.0–0.1)
Basophils Relative: 0.3 % (ref 0.0–3.0)
Eosinophils Absolute: 0.1 10*3/uL (ref 0.0–0.7)
Eosinophils Relative: 2.5 % (ref 0.0–5.0)
HCT: 42.7 % (ref 39.0–52.0)
Hemoglobin: 14.2 g/dL (ref 13.0–17.0)
Lymphocytes Relative: 20.2 % (ref 12.0–46.0)
Lymphs Abs: 0.8 10*3/uL (ref 0.7–4.0)
MCHC: 33.2 g/dL (ref 30.0–36.0)
MCV: 96 fl (ref 78.0–100.0)
Monocytes Absolute: 0.4 10*3/uL (ref 0.1–1.0)
Monocytes Relative: 10.8 % (ref 3.0–12.0)
Neutro Abs: 2.5 10*3/uL (ref 1.4–7.7)
Neutrophils Relative %: 66.2 % (ref 43.0–77.0)
Platelets: 97 10*3/uL — ABNORMAL LOW (ref 150.0–400.0)
RBC: 4.45 Mil/uL (ref 4.22–5.81)
RDW: 14.8 % (ref 11.5–15.5)
WBC: 3.8 10*3/uL — ABNORMAL LOW (ref 4.0–10.5)

## 2016-01-30 LAB — URIC ACID: Uric Acid, Serum: 5.8 mg/dL (ref 4.0–7.8)

## 2016-01-30 LAB — LIPID PANEL
Cholesterol: 114 mg/dL (ref 0–200)
HDL: 35.9 mg/dL — ABNORMAL LOW (ref >39.00)
LDL Cholesterol: 43 mg/dL (ref 0–99)
NonHDL: 78.32
Total CHOL/HDL Ratio: 3
Triglycerides: 175 mg/dL — ABNORMAL HIGH (ref 0.0–149.0)
VLDL: 35 mg/dL (ref 0.0–40.0)

## 2016-01-30 NOTE — Patient Instructions (Addendum)
   If you are waking up  Groggy,  Try omitting the the alprazolam ,  Or try reducing the trazodone to 1/2 tablet   I recommend adding 30 minutes of  WALKING ON A TREADMILL,  using an incline   As many days as you can to help you lose weight    Elevating your legs and walking will both help get rid of fluid retention in your legs cuased by venous insufficiency

## 2016-01-30 NOTE — Telephone Encounter (Signed)
FYI

## 2016-01-30 NOTE — Telephone Encounter (Signed)
Pt called to tell dr. Derrel Nip that Dr. Georgette Shell will be doing his back surgery-ph# [phone] and Dr.Vali (I may have that wrong-it was hard to understand on voice mail) will be doing his knee surgery. ph# [phone]

## 2016-01-30 NOTE — Progress Notes (Signed)
Subjective:  Patient ID: Jeffery Reynolds, male    DOB: 05/03/1934  Age: 82 y.o. MRN: [id number]  CC: The primary encounter diagnosis was Essential hypertension. Diagnoses of Long term current use of anticoagulant therapy, Hyperlipidemia, Idiopathic gout, unspecified chronicity, unspecified site, Preoperative general physical examination, Persistent atrial fibrillation ([place]), H/O: gout, and Venous stasis of both lower extremities were also pertinent to this visit.  HPI Jeffery Reynolds presents for 6 MONTH FOLLOW UP on hyperlipidemia. Hypertension and atrial fibrillation.   He has been suffering from Low back pain , and is scheduled for an ESI in [place] by Dr Frutoso Schatz  Intentionally losing wt.  His goal is 180 lbs  No gout flares  Sleeping 6 hours per night  Using trazodone  to sleep 150 mg   Venous stasis changes  Noted to legs..  Not wearing compression stockings.   Works out at gym with [employer] but does not walk or urun    Outpatient Prescriptions Prior to Visit  Medication Sig Dispense Refill  . allopurinol (ZYLOPRIM) 300 MG tablet Take on Monday, Wednesday and Friday. 90 tablet 1  . ALPRAZolam (XANAX) 0.25 MG tablet Take 1 tablet (0.25 mg total) by mouth at bedtime. 30 tablet 3  . atorvastatin (LIPITOR) 40 MG tablet Take 1 tablet (40 mg total) by mouth daily. 90 tablet 1  . azelastine (OPTIVAR) 0.05 % ophthalmic solution Place 1 drop into both eyes 2 (two) times daily. 6 mL 5  . fluticasone (FLONASE) 50 MCG/ACT nasal spray Place 2 sprays into both nostrils daily. 16 g 6  . HYDROcodone-acetaminophen (NORCO/VICODIN) 5-325 MG tablet Take 1 tablet twice a day as needed. 60 tablet 0  . metoprolol (LOPRESSOR) 50 MG tablet TAKE 1 TABLET(50 MG) BY MOUTH TWICE DAILY 180 tablet 3  . Omeprazole Magnesium (PRILOSEC OTC PO) Take by mouth daily.    . sildenafil (VIAGRA) 100 MG tablet Take 100 mg by mouth daily as needed for erectile dysfunction.    . traMADol (ULTRAM) 50 MG tablet TAKE 1  TABLET BY MOUTH EVERY DAY AT SUPPER 30 tablet 5  . traZODone (DESYREL) 150 MG tablet TAKE 1 TABLET(150 MG) BY MOUTH AT BEDTIME 90 tablet 1  . warfarin (COUMADIN) 5 MG tablet Take 1 tablet by mouth daily or as directed by coumadin clinic 90 tablet 1   No facility-administered medications prior to visit.    Review of Systems;  Patient denies headache, fevers, malaise, unintentional weight loss, skin rash, eye pain, sinus congestion and sinus pain, sore throat, dysphagia,  hemoptysis , cough, dyspnea, wheezing, chest pain, palpitations, orthopnea, edema, abdominal pain, nausea, melena, diarrhea, constipation, flank pain, dysuria, hematuria, urinary  Frequency, nocturia, numbness, tingling, seizures,  Focal weakness, Loss of consciousness,  Tremor, insomnia, depression, anxiety, and suicidal ideation.      Objective:  BP 106/68 mmHg  Pulse 68  Temp(Src) 98.7 F (37.1 C) (Oral)  Resp 16  Ht 5' 7.5" (1.715 m)  Wt 205 lb (92.987 kg)  BMI 31.62 kg/m2  SpO2 96%  BP Readings from Last 3 Encounters:  01/30/16 106/68  10/24/15 112/84  09/14/15 108/60    Wt Readings from Last 3 Encounters:  01/30/16 205 lb (92.987 kg)  10/24/15 208 lb 4 oz (94.462 kg)  09/14/15 209 lb 8 oz (95.029 kg)    General appearance: alert, cooperative and appears stated age Ears: normal TM's and external ear canals both ears Throat: lips, mucosa, and tongue normal; teeth and gums normal Neck: no adenopathy, no carotid  bruit, supple, symmetrical, trachea midline and thyroid not enlarged, symmetric, no tenderness/mass/nodules Back: symmetric, no curvature. ROM normal. No CVA tenderness. Lungs: clear to auscultation bilaterally Heart: regular rate and rhythm, S1, S2 normal, no murmur, click, rub or gallop Abdomen: soft, non-tender; bowel sounds normal; no masses,  no organomegaly Pulses: 2+ and symmetric Skin: Skin color, texture, turgor normal. No rashes or lesions Lymph nodes: Cervical, supraclavicular, and  axillary nodes normal.  Lab Results  Component Value Date   HGBA1C 5.1 04/18/2014    Lab Results  Component Value Date   CREATININE 0.99 01/30/2016   CREATININE 1.11 09/11/2015   CREATININE 1.24 04/11/2015    Lab Results  Component Value Date   WBC 3.8* 01/30/2016   HGB 14.2 01/30/2016   HCT 42.7 01/30/2016   PLT 97.0 Repeated and verified X2.* 01/30/2016   GLUCOSE 104* 01/30/2016   CHOL 114 01/30/2016   TRIG 175.0* 01/30/2016   HDL 35.90* 01/30/2016   LDLDIRECT 45.0 09/11/2015   LDLCALC 43 01/30/2016   ALT 39 01/30/2016   AST 27 01/30/2016   NA 142 01/30/2016   K 4.4 01/30/2016   CL 108 01/30/2016   CREATININE 0.99 01/30/2016   BUN 22 01/30/2016   CO2 27 01/30/2016   TSH 2.43 09/11/2015   INR 2.0 01/09/2016   HGBA1C 5.1 04/18/2014    Mr Brain Wo Contrast  06/26/2015  CLINICAL DATA:  Short term memory loss.  Cognitive decline. EXAM: MRI HEAD WITHOUT CONTRAST TECHNIQUE: Multiplanar, multiecho pulse sequences of the brain and surrounding structures were obtained without intravenous contrast. COMPARISON:  None. FINDINGS: Moderate generalized cerebral atrophy. Mild ventricular enlargement consistent with atrophy. Chronic infarct left frontal lobe. No other significant chronic ischemia Negative for acute infarct. Chronic micro hemorrhage right paracentral pons and left corona radiata. No fluid collection. Negative for mass or edema. Paranasal sinuses are clear. Normal orbit. Pituitary normal in size. IMPRESSION: Moderate atrophy. Chronic left frontal infarct. Chronic micro hemorrhage. No acute abnormality. Electronically Signed   By: Franchot Gallo M.D.   On: 06/26/2015 10:45    Assessment & Plan:   Problem List Items Addressed This Visit    Persistent atrial fibrillation ([place])    Rate controlled, stroke mtigation risk reduced with daily use of warfarin       Hyperlipidemia    Managed with Lipitor,  LDL at goal.  Liver enzymes normal. No changes today   Lab Results    Component Value Date   CHOL 114 01/30/2016   HDL 35.90* 01/30/2016   LDLCALC 43 01/30/2016   LDLDIRECT 45.0 09/11/2015   TRIG 175.0* 01/30/2016   CHOLHDL 3 01/30/2016   Lab Results  Component Value Date   ALT 39 01/30/2016   AST 27 01/30/2016   ALKPHOS 86 01/30/2016   BILITOT 1.1 01/30/2016         Essential hypertension - Primary    Well controlled on current regimen. Renal function stable, no changes today.  Lab Results  Component Value Date   CREATININE 0.99 01/30/2016   Lab Results  Component Value Date   NA 142 01/30/2016   K 4.4 01/30/2016   CL 108 01/30/2016   CO2 27 01/30/2016         H/O: gout    No recent episodes ,  Continue allopurinol       Preoperative general physical examination    Patient  is considered to be at moderate risk  For perioperative complications given his history of atrial fibrillation.  He will  need suspension of coumadin       Venous stasis of both lower extremities    Encouraged to wear compression stockings and walk.        Other Visit Diagnoses    Long term current use of anticoagulant therapy        Idiopathic gout, unspecified chronicity, unspecified site           I am having Mr. Herrington maintain his sildenafil, Omeprazole Magnesium (PRILOSEC OTC PO), azelastine, fluticasone, HYDROcodone-acetaminophen, allopurinol, atorvastatin, traZODone, ALPRAZolam, traMADol, metoprolol, and warfarin.  No orders of the defined types were placed in this encounter.    There are no discontinued medications.  Follow-up: Return in about 6 months (around 07/31/2016) for annual wellness .   Crecencio Mc, MD

## 2016-01-30 NOTE — Telephone Encounter (Signed)
Jeffery Reynolds, I do not have records from either ,  Can you ask [name] to sign a release?

## 2016-01-31 ENCOUNTER — Telehealth: Payer: Self-pay | Admitting: Cardiovascular Disease

## 2016-01-31 DIAGNOSIS — M5416 Radiculopathy, lumbar region: Secondary | ICD-10-CM | POA: Diagnosis not present

## 2016-01-31 DIAGNOSIS — Z01818 Encounter for other preprocedural examination: Secondary | ICD-10-CM | POA: Insufficient documentation

## 2016-01-31 DIAGNOSIS — I878 Other specified disorders of veins: Secondary | ICD-10-CM | POA: Insufficient documentation

## 2016-01-31 DIAGNOSIS — M25562 Pain in left knee: Secondary | ICD-10-CM | POA: Diagnosis not present

## 2016-01-31 NOTE — Assessment & Plan Note (Signed)
No recent episodes ,  Continue allopurinol

## 2016-01-31 NOTE — Telephone Encounter (Signed)
Patient coming in to sign release today.

## 2016-01-31 NOTE — Telephone Encounter (Signed)
Left message on machine for patient to contact the office.   

## 2016-01-31 NOTE — Assessment & Plan Note (Signed)
Managed with Lipitor,  LDL at goal.  Liver enzymes normal. No changes today   Lab Results  Component Value Date   CHOL 114 01/30/2016   HDL 35.90* 01/30/2016   LDLCALC 43 01/30/2016   LDLDIRECT 45.0 09/11/2015   TRIG 175.0* 01/30/2016   CHOLHDL 3 01/30/2016   Lab Results  Component Value Date   ALT 39 01/30/2016   AST 27 01/30/2016   ALKPHOS 86 01/30/2016   BILITOT 1.1 01/30/2016

## 2016-01-31 NOTE — Telephone Encounter (Signed)
S/w pt who reports HR monitor on treadmill at [employer] yesterday showed HR 177. He stopped exercising and denied any SOB or other sx. When he returned home, he reports BP 130s/60s, HR 60s. He went back to [employer] today and was told by an employee that the HR monitors are not accurate. Advised pt take his home pulse ox meter to the gym to monitor HR with that for a more accurate reading. He verbalized understanding with no further questions.

## 2016-01-31 NOTE — Telephone Encounter (Signed)
Pt calling stating his usual HR is around 68-75  Yesterday while working out (after a long time) on the machine it said his heart rate was 177 He was taken back once he saw that and got right off.  He is going to try and go to the gym again today and see if that happens again Just wants to make sure he is okay  He states he is going out of town for 2w  Please advise.

## 2016-01-31 NOTE — Assessment & Plan Note (Signed)
Well controlled on current regimen. Renal function stable, no changes today.  Lab Results  Component Value Date   CREATININE 0.99 01/30/2016   Lab Results  Component Value Date   NA 142 01/30/2016   K 4.4 01/30/2016   CL 108 01/30/2016   CO2 27 01/30/2016

## 2016-01-31 NOTE — Telephone Encounter (Signed)
Received request for cardiac and anticoag clearance from [facility] for lumbar radiofrequency ablation. Date TBD. Phone [phone] Placed in MD basket.

## 2016-01-31 NOTE — Assessment & Plan Note (Signed)
Rate controlled, stroke mtigation risk reduced with daily use of warfarin

## 2016-01-31 NOTE — Assessment & Plan Note (Signed)
Encouraged to wear compression stockings and walk.

## 2016-01-31 NOTE — Assessment & Plan Note (Addendum)
Patient  is considered to be at moderate risk  For perioperative complications given his history of atrial fibrillation.  He will need suspension of coumadin

## 2016-02-04 ENCOUNTER — Encounter: Payer: Self-pay | Admitting: *Deleted

## 2016-02-08 ENCOUNTER — Telehealth: Payer: Self-pay | Admitting: Cardiovascular Disease

## 2016-02-08 NOTE — Telephone Encounter (Signed)
Request for surgical clearance:  1. What type of surgery is being performed? Lower radiofrequency ablation   2. When is this surgery scheduled? TBD   3. Are there any medications that need to be held prior to surgery and how long? [facility] requesting to have pt hold coumadin 5 days prior to procedure   4. Name of physician performing surgery? None noted on clearance form   5. What is your office phone and fax number? Phone: [phone]; fax: [phone]. Attn: Jamesetta So  Clearance faxed to our office requesting MD approval to hold coumadin. Dr. Fletcher Anon signed agreeable w/plan.  Her next coumadin check is June 28. Forward to coumadin clinic to make aware.

## 2016-02-08 NOTE — Telephone Encounter (Signed)
Faxed clearance form to Rosilyn Mings., Attn: Jamesetta So, to [phone] Pt has lumbar radiofrequency ablation scheduled TBD

## 2016-02-15 ENCOUNTER — Telehealth: Payer: Self-pay | Admitting: Cardiovascular Disease

## 2016-02-15 NOTE — Telephone Encounter (Signed)
Pt is having a procedure on his back, and needs to know when to stop his Warfarin. Please call. Procedure is 6/28.

## 2016-02-15 NOTE — Telephone Encounter (Signed)
S/w pt. He reports having lumbar radiofrequency ablation June 28 and inquires when to stop coumadin. Clearance form was faxed on 6/16 to [facility] approving stopping coumadin 5 days prior and 24 hours after procedure. At that time procedure had not yet been scheduled. It does not state on the clearance form from [facility] that pt will need INR check prior to ablation. I reviewed Dr. Tyrell Antonio approval to stop coumadin 5 days before and resume 24 hours after ablation.  He has not taken it today.  S/w Ivin Booty, [facility], who states pt will need INR check on July 5.  S/w pt who is agreeable w/plan and asks I call back and leave date/time of INR visit on his voice mail. I called back and left information with call back number if questions on cell phone VM.

## 2016-02-20 DIAGNOSIS — M47817 Spondylosis without myelopathy or radiculopathy, lumbosacral region: Secondary | ICD-10-CM | POA: Diagnosis not present

## 2016-02-20 DIAGNOSIS — G894 Chronic pain syndrome: Secondary | ICD-10-CM | POA: Diagnosis not present

## 2016-02-22 ENCOUNTER — Telehealth: Payer: Self-pay | Admitting: *Deleted

## 2016-02-22 NOTE — Telephone Encounter (Signed)
Stop, will dc med

## 2016-02-22 NOTE — Telephone Encounter (Signed)
See note below.  Would you like to refill or STOP?

## 2016-02-22 NOTE — Telephone Encounter (Signed)
Patient stated that he is out of the alprazolam, he feels that he's sleeping well with out it. If Dr. Derrel Nip wants him to continue the medication, he would like to have a refill.

## 2016-02-25 NOTE — Telephone Encounter (Signed)
Left detailed message (HIPPA compliant) that medication is to be stopped and has been discontinued, per PCP note below.

## 2016-02-27 ENCOUNTER — Encounter (INDEPENDENT_AMBULATORY_CARE_PROVIDER_SITE_OTHER): Payer: Self-pay

## 2016-02-27 ENCOUNTER — Ambulatory Visit (INDEPENDENT_AMBULATORY_CARE_PROVIDER_SITE_OTHER): Payer: Medicare Other | Admitting: Pharmacist

## 2016-02-27 DIAGNOSIS — I4819 Other persistent atrial fibrillation: Secondary | ICD-10-CM

## 2016-02-27 DIAGNOSIS — Z5181 Encounter for therapeutic drug level monitoring: Secondary | ICD-10-CM | POA: Diagnosis not present

## 2016-02-27 DIAGNOSIS — I481 Persistent atrial fibrillation: Secondary | ICD-10-CM

## 2016-02-27 LAB — POCT INR: INR: 1.4

## 2016-02-28 DIAGNOSIS — M6281 Muscle weakness (generalized): Secondary | ICD-10-CM | POA: Diagnosis not present

## 2016-03-04 ENCOUNTER — Other Ambulatory Visit: Payer: Self-pay | Admitting: Internal Medicine

## 2016-03-04 NOTE — Telephone Encounter (Signed)
Pt called about needing a refill for HYDROcodone-acetaminophen (NORCO/VICODIN) 5-325 MG tablet  Please call pt when it's ready @ [phone]. Thank you!

## 2016-03-04 NOTE — Telephone Encounter (Signed)
Last refill was 11/29/15, please advise, thanks

## 2016-03-05 MED ORDER — HYDROCODONE-ACETAMINOPHEN 5-325 MG PO TABS: ORAL_TABLET | ORAL | Status: DC

## 2016-03-05 NOTE — Telephone Encounter (Signed)
Left patient a voicemail to notify him that Rx has been placed up front for pick up.

## 2016-03-05 NOTE — Telephone Encounter (Signed)
refilled 

## 2016-03-06 DIAGNOSIS — M5416 Radiculopathy, lumbar region: Secondary | ICD-10-CM | POA: Diagnosis not present

## 2016-03-06 DIAGNOSIS — M25562 Pain in left knee: Secondary | ICD-10-CM | POA: Diagnosis not present

## 2016-03-06 DIAGNOSIS — M545 Low back pain: Secondary | ICD-10-CM | POA: Diagnosis not present

## 2016-03-11 DIAGNOSIS — M545 Low back pain: Secondary | ICD-10-CM | POA: Diagnosis not present

## 2016-03-11 DIAGNOSIS — M5416 Radiculopathy, lumbar region: Secondary | ICD-10-CM | POA: Diagnosis not present

## 2016-03-12 ENCOUNTER — Ambulatory Visit (INDEPENDENT_AMBULATORY_CARE_PROVIDER_SITE_OTHER): Payer: Medicare Other

## 2016-03-12 DIAGNOSIS — Z5181 Encounter for therapeutic drug level monitoring: Secondary | ICD-10-CM | POA: Diagnosis not present

## 2016-03-12 DIAGNOSIS — I481 Persistent atrial fibrillation: Secondary | ICD-10-CM

## 2016-03-12 DIAGNOSIS — I4819 Other persistent atrial fibrillation: Secondary | ICD-10-CM

## 2016-03-12 LAB — POCT INR: INR: 2

## 2016-03-13 DIAGNOSIS — M545 Low back pain: Secondary | ICD-10-CM | POA: Diagnosis not present

## 2016-03-13 DIAGNOSIS — M5416 Radiculopathy, lumbar region: Secondary | ICD-10-CM | POA: Diagnosis not present

## 2016-03-18 ENCOUNTER — Telehealth: Payer: Self-pay | Admitting: *Deleted

## 2016-03-18 NOTE — Telephone Encounter (Signed)
Patient stated that he has a runny nose a full day. The mucus is clear.   He has been taking fluticasone prolintane that he had left from a previous Rx. He requested a call in reference to something else he could possibly take.  Pt contact  [phone]

## 2016-03-18 NOTE — Telephone Encounter (Signed)
You can use Benadryl but you should also consider adding one of these newer second generation antihistamines that are longer acting, non sedating and  available OTC:  Generic  Zyrtec, which is cetirizine.    generic Allegra , available generically as fexofenadine ; comes in 60 mg and 180 mg once daily strengths.    Generic Claritin :  also available as loratidine .

## 2016-03-18 NOTE — Telephone Encounter (Signed)
Spoke with patient Runny nose for a few days, Flonase used this morning, from a prior Rx.   No fevers, no chest congestion, nose was not stopped up at all.  No aches or chills. Only the runny nose, states it is running like a faucet.  Please advise. thanks

## 2016-03-18 NOTE — Telephone Encounter (Signed)
Spoke with the patient he will try the generic Zytrec. thanks

## 2016-03-20 DIAGNOSIS — M5416 Radiculopathy, lumbar region: Secondary | ICD-10-CM | POA: Diagnosis not present

## 2016-03-20 DIAGNOSIS — M545 Low back pain: Secondary | ICD-10-CM | POA: Diagnosis not present

## 2016-03-24 DIAGNOSIS — M5416 Radiculopathy, lumbar region: Secondary | ICD-10-CM | POA: Diagnosis not present

## 2016-03-24 DIAGNOSIS — M545 Low back pain: Secondary | ICD-10-CM | POA: Diagnosis not present

## 2016-03-27 DIAGNOSIS — M545 Low back pain: Secondary | ICD-10-CM | POA: Diagnosis not present

## 2016-03-27 DIAGNOSIS — M5416 Radiculopathy, lumbar region: Secondary | ICD-10-CM | POA: Diagnosis not present

## 2016-03-31 DIAGNOSIS — M545 Low back pain: Secondary | ICD-10-CM | POA: Diagnosis not present

## 2016-03-31 DIAGNOSIS — M5416 Radiculopathy, lumbar region: Secondary | ICD-10-CM | POA: Diagnosis not present

## 2016-04-02 ENCOUNTER — Ambulatory Visit (INDEPENDENT_AMBULATORY_CARE_PROVIDER_SITE_OTHER): Payer: Medicare Other

## 2016-04-02 DIAGNOSIS — I481 Persistent atrial fibrillation: Secondary | ICD-10-CM

## 2016-04-02 DIAGNOSIS — Z5181 Encounter for therapeutic drug level monitoring: Secondary | ICD-10-CM

## 2016-04-02 DIAGNOSIS — I4819 Other persistent atrial fibrillation: Secondary | ICD-10-CM

## 2016-04-02 LAB — POCT INR: INR: 1.9

## 2016-04-03 ENCOUNTER — Ambulatory Visit (INDEPENDENT_AMBULATORY_CARE_PROVIDER_SITE_OTHER): Payer: Medicare Other | Admitting: Family

## 2016-04-03 ENCOUNTER — Encounter: Payer: Self-pay | Admitting: Family

## 2016-04-03 VITALS — BP 114/68 | HR 86 | Temp 98.2°F | Wt 205.4 lb

## 2016-04-03 DIAGNOSIS — J309 Allergic rhinitis, unspecified: Secondary | ICD-10-CM

## 2016-04-03 DIAGNOSIS — I2510 Atherosclerotic heart disease of native coronary artery without angina pectoris: Secondary | ICD-10-CM

## 2016-04-03 MED ORDER — AMOXICILLIN 500 MG PO CAPS: 500.0000 mg | ORAL_CAPSULE | Freq: Two times a day (BID) | ORAL | 0 refills | Status: DC

## 2016-04-03 NOTE — Progress Notes (Signed)
Pre visit review using our clinic review tool, if applicable. No additional management support is needed unless otherwise documented below in the visit note. 

## 2016-04-03 NOTE — Progress Notes (Signed)
Subjective:    Patient ID: Jeffery Reynolds, male    DOB: 17-Jan-1934, 82 y.o.   MRN: [id number]  CC: Jeffery Reynolds is a 82 y.o. male who presents today for an acute visit.    HPI: Patient here with complaints of sinus congestion, runny nose for the past 5 days ago, worsening. Endorses episode of coughing last night yellow colored phlegm. No fever, chills. Patient has history of seasonal allergies.Hasn't tried any medications.     HISTORY:  Past Medical History:  Diagnosis Date  . A-fib ([place]) 2000      . Arthritis   . CAD (coronary artery disease)   . Colon polyps   . Genital warts   . GERD (gastroesophageal reflux disease)   . Gout    Controlled with Allopurinol  . History of blood transfusion   . History of chicken pox   . Hyperlipidemia   . Hypertension   . Seasonal allergies   . Sleep apnea, obstructive   . 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 CARDIAC CATHETERIZATION    . CORONARY ARTERY BYPASS GRAFT  1995   CABG x 6 in [place], [place]    . GYNECOMASTIA EXCISION    . KNEE SURGERY     bilateral   . ROTATOR CUFF REPAIR     right  . TONSILLECTOMY AND ADENOIDECTOMY  1943   Family History  Problem Relation Age of Onset  . Heart disease Father   . Heart attack Father 36    MI  . Alcohol abuse Father   . Hyperlipidemia Father   . Hypertension Father   . Heart attack Paternal Grandmother 55  . Heart attack Paternal Grandfather 51  . Cancer Mother     Breast cancer and Lung Cancer  . Diabetes Brother   . Dementia Brother   . Crohn's disease Daughter   . Cancer Brother     lung cancer  . Alcohol abuse Son     Allergies: Review of patient's allergies indicates no known allergies. Current Outpatient Prescriptions on File Prior to Visit  Medication Sig Dispense Refill  . allopurinol (ZYLOPRIM) 300 MG tablet Take on Monday, Wednesday and Friday. 90 tablet 1  . atorvastatin (LIPITOR) 40 MG tablet  Take 1 tablet (40 mg total) by mouth daily. 90 tablet 1  . azelastine (OPTIVAR) 0.05 % ophthalmic solution Place 1 drop into both eyes 2 (two) times daily. 6 mL 5  . fluticasone (FLONASE) 50 MCG/ACT nasal spray Place 2 sprays into both nostrils daily. 16 g 6  . HYDROcodone-acetaminophen (NORCO/VICODIN) 5-325 MG tablet Take 1 tablet twice a day as needed. 60 tablet 0  . metoprolol (LOPRESSOR) 50 MG tablet TAKE 1 TABLET(50 MG) BY MOUTH TWICE DAILY 180 tablet 3  . Omeprazole Magnesium (PRILOSEC OTC PO) Take by mouth daily.    . sildenafil (VIAGRA) 100 MG tablet Take 100 mg by mouth daily as needed for erectile dysfunction.    . traMADol (ULTRAM) 50 MG tablet TAKE 1 TABLET BY MOUTH EVERY DAY AT SUPPER 30 tablet 5  . traZODone (DESYREL) 150 MG tablet TAKE 1 TABLET(150 MG) BY MOUTH AT BEDTIME 90 tablet 1  . warfarin (COUMADIN) 5 MG tablet Take 1 tablet by mouth daily or as directed by coumadin clinic 90 tablet 1   No current facility-administered medications on file prior to visit.     Social History  Substance Use Topics  . Smoking status:  Never Smoker  . Smokeless tobacco: Not on file  . Alcohol use Yes     Comment: occasional     Review of Systems  Constitutional: Negative for chills and fever.  HENT: Positive for rhinorrhea. Negative for congestion, ear pain, facial swelling and sinus pressure.   Respiratory: Negative for cough, shortness of breath and wheezing.   Cardiovascular: Negative for chest pain and palpitations.  Gastrointestinal: Negative for nausea and vomiting.      Objective:    BP 114/68 (BP Location: Left Arm, Patient Position: Sitting, Cuff Size: Normal)   Pulse 86   Temp 98.2 F (36.8 C) (Oral)   Wt 205 lb 6.4 oz (93.2 kg)   SpO2 96%   BMI 31.70 kg/m    Physical Exam  Constitutional: Vital signs are normal. He appears well-developed and well-nourished.  HENT:  Head: Normocephalic and atraumatic.  Right Ear: Hearing, tympanic membrane, external ear and ear  canal normal. No drainage, swelling or tenderness. Tympanic membrane is not injected, not erythematous and not bulging. No middle ear effusion. No decreased hearing is noted.  Left Ear: Hearing, tympanic membrane, external ear and ear canal normal. No drainage, swelling or tenderness. Tympanic membrane is not injected, not erythematous and not bulging.  No middle ear effusion. No decreased hearing is noted.  Nose: Rhinorrhea present. Right sinus exhibits no maxillary sinus tenderness and no frontal sinus tenderness. Left sinus exhibits no maxillary sinus tenderness and no frontal sinus tenderness.  Mouth/Throat: Uvula is midline, oropharynx is clear and moist and mucous membranes are normal. No oropharyngeal exudate, posterior oropharyngeal edema, posterior oropharyngeal erythema or tonsillar abscesses.  Eyes: Conjunctivae are normal.  Cardiovascular: Regular rhythm and normal heart sounds.   Pulmonary/Chest: Effort normal and breath sounds normal. No respiratory distress. He has no wheezes. He has no rhonchi. He has no rales.  Lymphadenopathy:       Head (right side): No submental, no submandibular, no tonsillar, no preauricular, no posterior auricular and no occipital adenopathy present.       Head (left side): No submental, no submandibular, no tonsillar, no preauricular, no posterior auricular and no occipital adenopathy present.    He has no cervical adenopathy.  Neurological: He is alert.  Skin: Skin is warm and dry.  Psychiatric: He has a normal mood and affect. His speech is normal and behavior is normal.  Vitals reviewed.      Assessment & Plan:   1. Allergic rhinitis, unspecified allergic rhinitis type Suspect allergic versus viral rhinitis. Printed prescription for amoxicillin and advised patient to wait 1-2 more days of symptom management prior to filling. Patient agreed with this plan.  - amoxicillin (AMOXIL) 500 MG capsule; Take 1 capsule (500 mg total) by mouth 2 (two) times  daily.  Dispense: 14 capsule; Refill: 0    I am having [name] start on amoxicillin. I am also having him maintain his sildenafil, Omeprazole Magnesium (PRILOSEC OTC PO), azelastine, fluticasone, allopurinol, atorvastatin, traZODone, traMADol, metoprolol, warfarin, and HYDROcodone-acetaminophen.   Meds ordered this encounter  Medications  . amoxicillin (AMOXIL) 500 MG capsule    Sig: Take 1 capsule (500 mg total) by mouth 2 (two) times daily.    Dispense:  14 capsule    Refill:  0    Order Specific Question:   Supervising Provider    Answer:   Crecencio Mc [[id number]]    Return precautions given.   Risks, benefits, and alternatives of the medications and treatment plan prescribed today were discussed, and  patient expressed understanding.   Education regarding symptom management and diagnosis given to patient on AVS.  Continue to follow with TULLO, Aris Everts, MD for routine health maintenance.   Jeffery Reynolds and I agreed with plan.   Mable Paris, FNP

## 2016-04-03 NOTE — Patient Instructions (Signed)
I suspect that your infection is viral in nature.  As discussed, I advise that you wait to fill the antibiotic after 1-2 days of symptom management to see if your symptoms improve. If you do not show improvement, you may take the antibiotic as prescribed.   Increase intake of clear fluids. Congestion is best treated by hydration, when mucus is wetter, it is thinner, less sticky, and easier to expel from the body, either through coughing up drainage, or by blowing your nose.   Get plenty of rest.   Use saline nasal drops and blow your nose frequently. Run a humidifier at night and elevate the head of the bed. Vicks Vapor rub will help with congestion and cough. Steam showers and sinus massage for congestion.   Use Acetaminophen or Ibuprofen as needed for fever or pain. Avoid second hand smoke. Even the smallest exposure will worsen symptoms.   You can also try a teaspoon of honey to see if this will help reduce cough. Throat lozenges can sometimes be beneficial as well.    This illness will typically last 7 - 10 days.   Please follow up with our clinic if you develop a fever greater than 101 F, symptoms worsen, or do not resolve in the next week.

## 2016-05-01 ENCOUNTER — Other Ambulatory Visit: Payer: Self-pay | Admitting: *Deleted

## 2016-05-01 ENCOUNTER — Telehealth: Payer: Self-pay | Admitting: *Deleted

## 2016-05-01 MED ORDER — HYDROCODONE-ACETAMINOPHEN 5-325 MG PO TABS: ORAL_TABLET | ORAL | 0 refills | Status: DC

## 2016-05-01 NOTE — Telephone Encounter (Signed)
Spoke with patient and scheduled, thanks

## 2016-05-01 NOTE — Telephone Encounter (Signed)
Up front for pick up, thanks

## 2016-05-01 NOTE — Telephone Encounter (Signed)
Please advise for refills, last OV was 01/30/16 with PCP, and last refill was 03/05/2016 #60 no refills. thanks

## 2016-05-01 NOTE — Telephone Encounter (Signed)
Patient requested a medication refill for Hydrocodone  Pt contact [phone]

## 2016-05-01 NOTE — Telephone Encounter (Signed)
Refill for 30 days only.  OFFICE VISIT NEEDED EVERY 3 MONTHS FOR REFILLS ON NARCOTICS. TANYA PLEASE SCHEDULE

## 2016-05-12 ENCOUNTER — Other Ambulatory Visit: Payer: Self-pay | Admitting: Internal Medicine

## 2016-05-13 MED ORDER — ALPRAZOLAM 0.25 MG PO TABS: 0.2500 mg | ORAL_TABLET | Freq: Every evening | ORAL | 1 refills | Status: DC | PRN

## 2016-05-13 NOTE — Telephone Encounter (Signed)
He will have to refill the alprazolam locally.  I will not fill it for 90 days /90 tablets .

## 2016-05-13 NOTE — Telephone Encounter (Signed)
Med has been discontinued and reordered  , only way to get human out of the equation .

## 2016-05-13 NOTE — Telephone Encounter (Signed)
Last OV 6/17 ok to fill alprazolam?

## 2016-05-13 NOTE — Telephone Encounter (Signed)
Patient aware and would like script to go to [facility]. Tried to print but would nopt print in lab.

## 2016-05-14 ENCOUNTER — Other Ambulatory Visit: Payer: Self-pay | Admitting: Internal Medicine

## 2016-05-15 ENCOUNTER — Ambulatory Visit (INDEPENDENT_AMBULATORY_CARE_PROVIDER_SITE_OTHER): Payer: Medicare Other | Admitting: *Deleted

## 2016-05-15 DIAGNOSIS — Z5181 Encounter for therapeutic drug level monitoring: Secondary | ICD-10-CM

## 2016-05-15 DIAGNOSIS — I481 Persistent atrial fibrillation: Secondary | ICD-10-CM | POA: Diagnosis not present

## 2016-05-15 DIAGNOSIS — I4819 Other persistent atrial fibrillation: Secondary | ICD-10-CM

## 2016-05-15 LAB — POCT INR: INR: 2.9

## 2016-05-26 ENCOUNTER — Encounter: Payer: Self-pay | Admitting: Internal Medicine

## 2016-05-26 ENCOUNTER — Ambulatory Visit (INDEPENDENT_AMBULATORY_CARE_PROVIDER_SITE_OTHER): Payer: Medicare Other | Admitting: Internal Medicine

## 2016-05-26 DIAGNOSIS — Z23 Encounter for immunization: Secondary | ICD-10-CM

## 2016-05-26 DIAGNOSIS — I10 Essential (primary) hypertension: Secondary | ICD-10-CM

## 2016-05-26 DIAGNOSIS — M25562 Pain in left knee: Secondary | ICD-10-CM

## 2016-05-26 DIAGNOSIS — I2510 Atherosclerotic heart disease of native coronary artery without angina pectoris: Secondary | ICD-10-CM

## 2016-05-26 DIAGNOSIS — E782 Mixed hyperlipidemia: Secondary | ICD-10-CM

## 2016-05-26 DIAGNOSIS — G8929 Other chronic pain: Secondary | ICD-10-CM

## 2016-05-26 MED ORDER — TRAMADOL HCL 50 MG PO TABS: 50.0000 mg | ORAL_TABLET | Freq: Four times a day (QID) | ORAL | 5 refills | Status: DC | PRN

## 2016-05-26 NOTE — Assessment & Plan Note (Signed)
Well controlled on current regimen. Renal function stable, no changes today.  Lab Results  Component Value Date   CREATININE 0.99 01/30/2016   Lab Results  Component Value Date   NA 142 01/30/2016   K 4.4 01/30/2016   CL 108 01/30/2016   CO2 27 01/30/2016

## 2016-05-26 NOTE — Progress Notes (Signed)
Subjective:  Patient ID: Jeffery Reynolds, male    DOB: 02-May-1934  Age: 82 y.o. MRN: [id number]  CC: Diagnoses of Encounter for immunization, Chronic pain of left knee, Essential hypertension, and Mixed hyperlipidemia were pertinent to this visit.  HPI SEVERT AMORE presents for  Follow up on hypertension, hyperlipidemia,  And insomnia.    Hypertension: patient checks blood pressure twice weekly at home.  Readings have been for the most part < 140/80 at rest . Patient is following a reduced salt diet most days and is taking medications as prescribed..  Exercising regularly ,  Avoids treadmill due to chronic  left knee pain.  Restricts exercise to  Upper body workout 3 times per week.  Works up a sweat . Avoids excessive weight and overhead lifting since rotator cuff surgery on right shoulder remotely .using lower weights,  Lots of reps.   No recent  Flares of gout using allopurinol mon wed fri   Using xanax and trazodone to help him sleep.    HAS REDUCED VICodin use from TWO DAILY TO  once daily at bedtime to manage knee pain  But wants to get off it. However,  When he tries to skip his daily dose he develops malaise and tremors.  Using tramadol once daily at dinner.   Outpatient Medications Prior to Visit  Medication Sig Dispense Refill  . allopurinol (ZYLOPRIM) 300 MG tablet Take on Monday, Wednesday and Friday. 90 tablet 1  . ALPRAZolam (XANAX) 0.25 MG tablet Take 1 tablet (0.25 mg total) by mouth at bedtime as needed for sleep. 30 tablet 1  . atorvastatin (LIPITOR) 40 MG tablet TAKE 1 TABLET EVERY DAY 90 tablet 1  . HYDROcodone-acetaminophen (NORCO/VICODIN) 5-325 MG tablet Take 1 tablet twice a day as needed. 60 tablet 0  . metoprolol (LOPRESSOR) 50 MG tablet TAKE 1 TABLET(50 MG) BY MOUTH TWICE DAILY 180 tablet 3  . traZODone (DESYREL) 150 MG tablet TAKE 1 TABLET AT BEDTIME 90 tablet 1  . warfarin (COUMADIN) 5 MG tablet Take 1 tablet by mouth daily or as directed by coumadin  clinic 90 tablet 1  . traMADol (ULTRAM) 50 MG tablet TAKE 1 TABLET BY MOUTH EVERY DAY AT SUPPER 30 tablet 5  . azelastine (OPTIVAR) 0.05 % ophthalmic solution Place 1 drop into both eyes 2 (two) times daily. (Patient not taking: Reported on 05/26/2016) 6 mL 5  . fluticasone (FLONASE) 50 MCG/ACT nasal spray Place 2 sprays into both nostrils daily. (Patient not taking: Reported on 05/26/2016) 16 g 6  . Omeprazole Magnesium (PRILOSEC OTC PO) Take by mouth daily.    . sildenafil (VIAGRA) 100 MG tablet Take 100 mg by mouth daily as needed for erectile dysfunction.    Marland Kitchen amoxicillin (AMOXIL) 500 MG capsule Take 1 capsule (500 mg total) by mouth 2 (two) times daily. (Patient not taking: Reported on 05/26/2016) 14 capsule 0   No facility-administered medications prior to visit.     Review of Systems;  Patient denies headache, fevers, malaise, unintentional weight loss, skin rash, eye pain, sinus congestion and sinus pain, sore throat, dysphagia,  hemoptysis , cough, dyspnea, wheezing, chest pain, palpitations, orthopnea, edema, abdominal pain, nausea, melena, diarrhea, constipation, flank pain, dysuria, hematuria, urinary  Frequency, nocturia, numbness, tingling, seizures,  Focal weakness, Loss of consciousness,  Tremor, insomnia, depression, anxiety, and suicidal ideation.      Objective:  BP 128/70   Pulse 77   Temp 98 F (36.7 C) (Oral)   Resp 12  Ht 5\' 8"  (1.727 m)   Wt 204 lb 12 oz (92.9 kg)   SpO2 96%   BMI 31.13 kg/m   BP Readings from Last 3 Encounters:  05/26/16 128/70  04/03/16 114/68  01/30/16 106/68    Wt Readings from Last 3 Encounters:  05/26/16 204 lb 12 oz (92.9 kg)  04/03/16 205 lb 6.4 oz (93.2 kg)  01/30/16 205 lb (93 kg)    General appearance: alert, cooperative and appears stated age Ears: normal TM's and external ear canals both ears Throat: lips, mucosa, and tongue normal; teeth and gums normal Neck: no adenopathy, no carotid bruit, supple, symmetrical, trachea  midline and thyroid not enlarged, symmetric, no tenderness/mass/nodules Back: symmetric, no curvature. ROM normal. No CVA tenderness. Lungs: clear to auscultation bilaterally Heart: regular rate and rhythm, S1, S2 normal, no murmur, click, rub or gallop Abdomen: soft, non-tender; bowel sounds normal; no masses,  no organomegaly Pulses: 2+ and symmetric Skin: Skin color, texture, turgor normal. No rashes or lesions Lymph nodes: Cervical, supraclavicular, and axillary nodes normal.  Lab Results  Component Value Date   HGBA1C 5.1 04/18/2014    Lab Results  Component Value Date   CREATININE 0.99 01/30/2016   CREATININE 1.11 09/11/2015   CREATININE 1.24 04/11/2015    Lab Results  Component Value Date   WBC 3.8 (L) 01/30/2016   HGB 14.2 01/30/2016   HCT 42.7 01/30/2016   PLT 97.0 Repeated and verified X2. (L) 01/30/2016   GLUCOSE 104 (H) 01/30/2016   CHOL 114 01/30/2016   TRIG 175.0 (H) 01/30/2016   HDL 35.90 (L) 01/30/2016   LDLDIRECT 45.0 09/11/2015   LDLCALC 43 01/30/2016   ALT 39 01/30/2016   AST 27 01/30/2016   NA 142 01/30/2016   K 4.4 01/30/2016   CL 108 01/30/2016   CREATININE 0.99 01/30/2016   BUN 22 01/30/2016   CO2 27 01/30/2016   TSH 2.43 09/11/2015   INR 2.9 05/15/2016   HGBA1C 5.1 04/18/2014    Mr Brain Wo Contrast  Result Date: 06/26/2015 CLINICAL DATA:  Short term memory loss.  Cognitive decline. EXAM: MRI HEAD WITHOUT CONTRAST TECHNIQUE: Multiplanar, multiecho pulse sequences of the brain and surrounding structures were obtained without intravenous contrast. COMPARISON:  None. FINDINGS: Moderate generalized cerebral atrophy. Mild ventricular enlargement consistent with atrophy. Chronic infarct left frontal lobe. No other significant chronic ischemia Negative for acute infarct. Chronic micro hemorrhage right paracentral pons and left corona radiata. No fluid collection. Negative for mass or edema. Paranasal sinuses are clear. Normal orbit. Pituitary normal in  size. IMPRESSION: Moderate atrophy. Chronic left frontal infarct. Chronic micro hemorrhage. No acute abnormality. Electronically Signed   By: Franchot Gallo M.D.   On: 06/26/2015 10:45    Assessment & Plan:   Problem List Items Addressed This Visit    Hyperlipidemia    Managed with Lipitor,  LDL at goal.  Liver enzymes normal. No changes today   Lab Results  Component Value Date   CHOL 114 01/30/2016   HDL 35.90 (L) 01/30/2016   LDLCALC 43 01/30/2016   LDLDIRECT 45.0 09/11/2015   TRIG 175.0 (H) 01/30/2016   CHOLHDL 3 01/30/2016   Lab Results  Component Value Date   ALT 39 01/30/2016   AST 27 01/30/2016   ALKPHOS 86 01/30/2016   BILITOT 1.1 01/30/2016         Essential hypertension    Well controlled on current regimen. Renal function stable, no changes today.  Lab Results  Component Value Date  CREATININE 0.99 01/30/2016   Lab Results  Component Value Date   NA 142 01/30/2016   K 4.4 01/30/2016   CL 108 01/30/2016   CO2 27 01/30/2016         Chronic pain of left knee    He is managing his pain with one tramadol and one Vicodin daily but would like to stop the use of vicodin.  Will increase the dose of tramadol to 3 times daily and add tylenol 2000 mg daily        Relevant Medications   traMADol (ULTRAM) 50 MG tablet    Other Visit Diagnoses    Encounter for immunization       Relevant Orders   Flu vaccine HIGH DOSE PF (Completed)     A total of 25 minutes of face to face time was spent with patient more than half of which was spent in counselling about the above mentioned conditions  and coordination of care   I have discontinued Mr. Drawhorn amoxicillin. I have also changed his traMADol. Additionally, I am having him maintain his sildenafil, Omeprazole Magnesium (PRILOSEC OTC PO), azelastine, fluticasone, allopurinol, metoprolol, warfarin, HYDROcodone-acetaminophen, atorvastatin, ALPRAZolam, and traZODone.  Meds ordered this encounter  Medications  .  traMADol (ULTRAM) 50 MG tablet    Sig: Take 1 tablet (50 mg total) by mouth every 6 (six) hours as needed. Maximum 3 daily    Dispense:  90 tablet    Refill:  5    Medications Discontinued During This Encounter  Medication Reason  . amoxicillin (AMOXIL) 500 MG capsule Completed Course  . traMADol (ULTRAM) 50 MG tablet Reorder    Follow-up: Return in about 3 months (around 08/26/2016) for fasting labs prior to appt .   Crecencio Mc, MD

## 2016-05-26 NOTE — Patient Instructions (Addendum)
  vicodin withdrawal is NOT life threatening,  Just unpleasant.  It will resolve after a week.    For your pain control:  You can use up to 2000 mg of tylenol every day,  In divided doses (500 mg every 6 hours or 1000 mg twice daily )  You can add Aleve if needed, but  not more than  one tablet  every other day   You can use tramadol up to 3 tablets daily  And it can be combined with the tylenol and the Alevel    You can also try Salon Pas patches with lidocaine on your lower back .

## 2016-05-26 NOTE — Progress Notes (Signed)
Pre-visit discussion using our clinic review tool. No additional management support is needed unless otherwise documented below in the visit note.  

## 2016-05-26 NOTE — Assessment & Plan Note (Signed)
Managed with Lipitor,  LDL at goal.  Liver enzymes normal. No changes today   Lab Results  Component Value Date   CHOL 114 01/30/2016   HDL 35.90 (L) 01/30/2016   LDLCALC 43 01/30/2016   LDLDIRECT 45.0 09/11/2015   TRIG 175.0 (H) 01/30/2016   CHOLHDL 3 01/30/2016   Lab Results  Component Value Date   ALT 39 01/30/2016   AST 27 01/30/2016   ALKPHOS 86 01/30/2016   BILITOT 1.1 01/30/2016

## 2016-05-26 NOTE — Assessment & Plan Note (Addendum)
He is managing his pain with one tramadol and one Vicodin daily but would like to stop the use of vicodin.  Will increase the dose of tramadol to 3 times daily and add tylenol 2000 mg daily

## 2016-05-27 ENCOUNTER — Encounter: Payer: Self-pay | Admitting: Internal Medicine

## 2016-05-27 DIAGNOSIS — Z79899 Other long term (current) drug therapy: Secondary | ICD-10-CM | POA: Diagnosis not present

## 2016-05-27 DIAGNOSIS — Z79891 Long term (current) use of opiate analgesic: Secondary | ICD-10-CM | POA: Diagnosis not present

## 2016-06-13 ENCOUNTER — Ambulatory Visit (INDEPENDENT_AMBULATORY_CARE_PROVIDER_SITE_OTHER): Payer: Medicare Other | Admitting: Internal Medicine

## 2016-06-13 ENCOUNTER — Encounter: Payer: Self-pay | Admitting: Internal Medicine

## 2016-06-13 DIAGNOSIS — I2510 Atherosclerotic heart disease of native coronary artery without angina pectoris: Secondary | ICD-10-CM

## 2016-06-13 DIAGNOSIS — M25562 Pain in left knee: Secondary | ICD-10-CM

## 2016-06-13 DIAGNOSIS — G8929 Other chronic pain: Secondary | ICD-10-CM

## 2016-06-13 MED ORDER — HYDROCODONE-ACETAMINOPHEN 5-325 MG PO TABS: ORAL_TABLET | ORAL | 0 refills | Status: DC

## 2016-06-13 NOTE — Progress Notes (Signed)
Pre-visit discussion using our clinic review tool. No additional management support is needed unless otherwise documented below in the visit note.  

## 2016-06-13 NOTE — Patient Instructions (Addendum)
I agree with your gradual reduction in hydrocodone ,  and have refilled it for once daily use for November and December   Starting in January,  you will reduce dose to 1/2 tablet daily and supplement with tramadol as needed

## 2016-06-13 NOTE — Progress Notes (Signed)
Subjective:  Patient ID: Jeffery Reynolds, male    DOB: 1933-12-06  Age: 81 y.o. MRN: [id number]  CC: The encounter diagnosis was Chronic pain of left knee.  HPI Jeffery Reynolds presents for follow up on chronic conditions including chronic left knee pain. He was last seen in June and advised to reduce his use of Vicodin.   Did not fill the tramadol because he has reduced his Vicodin to one pill daily . Wants to to continue reducing the dose gradually over the following three months    Outpatient Medications Prior to Visit  Medication Sig Dispense Refill  . allopurinol (ZYLOPRIM) 300 MG tablet Take on Monday, Wednesday and Friday. 90 tablet 1  . ALPRAZolam (XANAX) 0.25 MG tablet Take 1 tablet (0.25 mg total) by mouth at bedtime as needed for sleep. 30 tablet 1  . atorvastatin (LIPITOR) 40 MG tablet TAKE 1 TABLET EVERY DAY 90 tablet 1  . azelastine (OPTIVAR) 0.05 % ophthalmic solution Place 1 drop into both eyes 2 (two) times daily. 6 mL 5  . fluticasone (FLONASE) 50 MCG/ACT nasal spray Place 2 sprays into both nostrils daily. 16 g 6  . metoprolol (LOPRESSOR) 50 MG tablet TAKE 1 TABLET(50 MG) BY MOUTH TWICE DAILY 180 tablet 3  . Omeprazole Magnesium (PRILOSEC OTC PO) Take by mouth daily.    . sildenafil (VIAGRA) 100 MG tablet Take 100 mg by mouth daily as needed for erectile dysfunction.    . traMADol (ULTRAM) 50 MG tablet Take 1 tablet (50 mg total) by mouth every 6 (six) hours as needed. Maximum 3 daily 90 tablet 5  . traZODone (DESYREL) 150 MG tablet TAKE 1 TABLET AT BEDTIME 90 tablet 1  . warfarin (COUMADIN) 5 MG tablet Take 1 tablet by mouth daily or as directed by coumadin clinic 90 tablet 1  . HYDROcodone-acetaminophen (NORCO/VICODIN) 5-325 MG tablet Take 1 tablet twice a day as needed. 60 tablet 0   No facility-administered medications prior to visit.     Review of Systems;  Patient denies headache, fevers, malaise, unintentional weight loss, skin rash, eye pain, sinus  congestion and sinus pain, sore throat, dysphagia,  hemoptysis , cough, dyspnea, wheezing, chest pain, palpitations, orthopnea, edema, abdominal pain, nausea, melena, diarrhea, constipation, flank pain, dysuria, hematuria, urinary  Frequency, nocturia, numbness, tingling, seizures,  Focal weakness, Loss of consciousness,  Tremor, insomnia, depression, anxiety, and suicidal ideation.      Objective:  BP 118/76   Pulse (!) 44   Temp 98.1 F (36.7 C) (Oral)   Resp 12   Ht 5\' 8"  (1.727 m)   Wt 203 lb 12 oz (92.4 kg)   SpO2 97%   BMI 30.98 kg/m   BP Readings from Last 3 Encounters:  06/13/16 118/76  05/26/16 128/70  04/03/16 114/68    Wt Readings from Last 3 Encounters:  06/13/16 203 lb 12 oz (92.4 kg)  05/26/16 204 lb 12 oz (92.9 kg)  04/03/16 205 lb 6.4 oz (93.2 kg)    General appearance: alert, cooperative and appears stated age Ears: normal TM's and external ear canals both ears Throat: lips, mucosa, and tongue normal; teeth and gums normal Neck: no adenopathy, no carotid bruit, supple, symmetrical, trachea midline and thyroid not enlarged, symmetric, no tenderness/mass/nodules Back: symmetric, no curvature. ROM normal. No CVA tenderness. Lungs: clear to auscultation bilaterally Heart: regular rate and rhythm, S1, S2 normal, no murmur, click, rub or gallop Abdomen: soft, non-tender; bowel sounds normal; no masses,  no organomegaly Pulses:  2+ and symmetric Skin: Skin color, texture, turgor normal. No rashes or lesions Lymph nodes: Cervical, supraclavicular, and axillary nodes normal.  Lab Results  Component Value Date   HGBA1C 5.1 04/18/2014    Lab Results  Component Value Date   CREATININE 0.99 01/30/2016   CREATININE 1.11 09/11/2015   CREATININE 1.24 04/11/2015    Lab Results  Component Value Date   WBC 3.8 (L) 01/30/2016   HGB 14.2 01/30/2016   HCT 42.7 01/30/2016   PLT 97.0 Repeated and verified X2. (L) 01/30/2016   GLUCOSE 104 (H) 01/30/2016   CHOL 114  01/30/2016   TRIG 175.0 (H) 01/30/2016   HDL 35.90 (L) 01/30/2016   LDLDIRECT 45.0 09/11/2015   LDLCALC 43 01/30/2016   ALT 39 01/30/2016   AST 27 01/30/2016   NA 142 01/30/2016   K 4.4 01/30/2016   CL 108 01/30/2016   CREATININE 0.99 01/30/2016   BUN 22 01/30/2016   CO2 27 01/30/2016   TSH 2.43 09/11/2015   INR 2.9 05/15/2016   HGBA1C 5.1 04/18/2014    Mr Brain Wo Contrast  Result Date: 06/26/2015 CLINICAL DATA:  Short term memory loss.  Cognitive decline. EXAM: MRI HEAD WITHOUT CONTRAST TECHNIQUE: Multiplanar, multiecho pulse sequences of the brain and surrounding structures were obtained without intravenous contrast. COMPARISON:  None. FINDINGS: Moderate generalized cerebral atrophy. Mild ventricular enlargement consistent with atrophy. Chronic infarct left frontal lobe. No other significant chronic ischemia Negative for acute infarct. Chronic micro hemorrhage right paracentral pons and left corona radiata. No fluid collection. Negative for mass or edema. Paranasal sinuses are clear. Normal orbit. Pituitary normal in size. IMPRESSION: Moderate atrophy. Chronic left frontal infarct. Chronic micro hemorrhage. No acute abnormality. Electronically Signed   By: Franchot Gallo M.D.   On: 06/26/2015 10:45    Assessment & Plan:   Problem List Items Addressed This Visit    Chronic pain of left knee    He is managing his pain with one tramadol and one Vicodin daily and would like to continue one tablet daily until January .          Relevant Medications   HYDROcodone-acetaminophen (NORCO/VICODIN) 5-325 MG tablet    Other Visit Diagnoses   None.     I am having Jeffery Reynolds maintain his sildenafil, Omeprazole Magnesium (PRILOSEC OTC PO), azelastine, fluticasone, allopurinol, metoprolol, warfarin, atorvastatin, ALPRAZolam, traZODone, traMADol, and HYDROcodone-acetaminophen.  Meds ordered this encounter  Medications  . DISCONTD: HYDROcodone-acetaminophen (NORCO/VICODIN) 5-325 MG tablet      Sig: Take 1 tablet once daily as needed.    Dispense:  30 tablet    Refill:  0    Refill on or after July 10 2016  . HYDROcodone-acetaminophen (NORCO/VICODIN) 5-325 MG tablet    Sig: Take 1 tablet once daily as needed.    Dispense:  30 tablet    Refill:  0    Refill on or after  Devember 16 2017    Medications Discontinued During This Encounter  Medication Reason  . HYDROcodone-acetaminophen (NORCO/VICODIN) 5-325 MG tablet Reorder  . HYDROcodone-acetaminophen (NORCO/VICODIN) 5-325 MG tablet Reorder    Follow-up: No Follow-up on file.   Crecencio Mc, MD

## 2016-06-15 NOTE — Assessment & Plan Note (Signed)
He is managing his pain with one tramadol and one Vicodin daily and would like to continue one tablet daily until January .

## 2016-06-18 ENCOUNTER — Ambulatory Visit (INDEPENDENT_AMBULATORY_CARE_PROVIDER_SITE_OTHER): Payer: Medicare Other

## 2016-06-18 DIAGNOSIS — Z5181 Encounter for therapeutic drug level monitoring: Secondary | ICD-10-CM | POA: Diagnosis not present

## 2016-06-18 DIAGNOSIS — I481 Persistent atrial fibrillation: Secondary | ICD-10-CM | POA: Diagnosis not present

## 2016-06-18 DIAGNOSIS — I4819 Other persistent atrial fibrillation: Secondary | ICD-10-CM

## 2016-06-18 LAB — POCT INR: INR: 4.9

## 2016-07-02 ENCOUNTER — Ambulatory Visit (INDEPENDENT_AMBULATORY_CARE_PROVIDER_SITE_OTHER): Payer: Medicare Other

## 2016-07-02 DIAGNOSIS — Z5181 Encounter for therapeutic drug level monitoring: Secondary | ICD-10-CM | POA: Diagnosis not present

## 2016-07-02 DIAGNOSIS — I4819 Other persistent atrial fibrillation: Secondary | ICD-10-CM

## 2016-07-02 DIAGNOSIS — I481 Persistent atrial fibrillation: Secondary | ICD-10-CM | POA: Diagnosis not present

## 2016-07-02 LAB — POCT INR: INR: 3.7

## 2016-07-09 ENCOUNTER — Telehealth: Payer: Self-pay | Admitting: Cardiovascular Disease

## 2016-07-09 NOTE — Telephone Encounter (Signed)
Received cardiac clearance/anticoagulant management from [facility] @ [facility] for lumbar radiofrequency ablation. No date noted. Placed in MD basket.  [phone] (fax)

## 2016-07-14 NOTE — Telephone Encounter (Signed)
Clearance faxed to [facility], [phone]

## 2016-07-16 ENCOUNTER — Ambulatory Visit (INDEPENDENT_AMBULATORY_CARE_PROVIDER_SITE_OTHER): Payer: Medicare Other

## 2016-07-16 ENCOUNTER — Other Ambulatory Visit: Payer: Self-pay | Admitting: Cardiovascular Disease

## 2016-07-16 DIAGNOSIS — I2510 Atherosclerotic heart disease of native coronary artery without angina pectoris: Secondary | ICD-10-CM

## 2016-07-16 DIAGNOSIS — I481 Persistent atrial fibrillation: Secondary | ICD-10-CM | POA: Diagnosis not present

## 2016-07-16 DIAGNOSIS — Z5181 Encounter for therapeutic drug level monitoring: Secondary | ICD-10-CM | POA: Diagnosis not present

## 2016-07-16 DIAGNOSIS — I4819 Other persistent atrial fibrillation: Secondary | ICD-10-CM

## 2016-07-16 LAB — POCT INR: INR: 2.2

## 2016-07-16 NOTE — Telephone Encounter (Signed)
Review for refill. 

## 2016-07-22 ENCOUNTER — Other Ambulatory Visit: Payer: Self-pay | Admitting: Internal Medicine

## 2016-07-22 NOTE — Telephone Encounter (Signed)
Last filled 06/16/16. Last OV 06/13/16.

## 2016-07-23 ENCOUNTER — Other Ambulatory Visit: Payer: Self-pay | Admitting: Internal Medicine

## 2016-07-23 MED ORDER — ALPRAZOLAM 0.25 MG PO TABS: 0.2500 mg | ORAL_TABLET | Freq: Every evening | ORAL | 2 refills | Status: DC | PRN

## 2016-07-31 ENCOUNTER — Ambulatory Visit (INDEPENDENT_AMBULATORY_CARE_PROVIDER_SITE_OTHER): Payer: Medicare Other | Admitting: Internal Medicine

## 2016-07-31 ENCOUNTER — Encounter: Payer: Self-pay | Admitting: Internal Medicine

## 2016-07-31 VITALS — BP 140/72 | HR 63 | Temp 97.5°F | Resp 12 | Ht 68.0 in | Wt 212.5 lb

## 2016-07-31 DIAGNOSIS — E782 Mixed hyperlipidemia: Secondary | ICD-10-CM | POA: Diagnosis not present

## 2016-07-31 DIAGNOSIS — Z5181 Encounter for therapeutic drug level monitoring: Secondary | ICD-10-CM

## 2016-07-31 DIAGNOSIS — M25562 Pain in left knee: Secondary | ICD-10-CM

## 2016-07-31 DIAGNOSIS — I872 Venous insufficiency (chronic) (peripheral): Secondary | ICD-10-CM

## 2016-07-31 DIAGNOSIS — I2510 Atherosclerotic heart disease of native coronary artery without angina pectoris: Secondary | ICD-10-CM | POA: Diagnosis not present

## 2016-07-31 DIAGNOSIS — D126 Benign neoplasm of colon, unspecified: Secondary | ICD-10-CM

## 2016-07-31 DIAGNOSIS — D696 Thrombocytopenia, unspecified: Secondary | ICD-10-CM

## 2016-07-31 DIAGNOSIS — F819 Developmental disorder of scholastic skills, unspecified: Secondary | ICD-10-CM

## 2016-07-31 DIAGNOSIS — M1811 Unilateral primary osteoarthritis of first carpometacarpal joint, right hand: Secondary | ICD-10-CM

## 2016-07-31 DIAGNOSIS — I878 Other specified disorders of veins: Secondary | ICD-10-CM

## 2016-07-31 DIAGNOSIS — G8929 Other chronic pain: Secondary | ICD-10-CM

## 2016-07-31 DIAGNOSIS — L603 Nail dystrophy: Secondary | ICD-10-CM

## 2016-07-31 MED ORDER — FUROSEMIDE 20 MG PO TABS: 20.0000 mg | ORAL_TABLET | ORAL | 3 refills | Status: DC

## 2016-07-31 MED ORDER — DICLOFENAC SODIUM 1 % TD GEL: 2.0000 g | Freq: Four times a day (QID) | TRANSDERMAL | 3 refills | Status: DC

## 2016-07-31 NOTE — Patient Instructions (Addendum)
.  For your fluid retention:  You can use  the fluid pill (furosmide)  every other day to help  get the fluid off your legs. .  But DO NOT INCREASE YOUR USE OF IT  Because it may harm your kidneys  I recommend going to [facility] to get some new compression stockings with a wide band at the top Get at least 2 pairs.    I am prescribing a gel to use on your thumb joint up ro 4 times daily called " voltaren" it will require Prior Approval fro [employer] so we will get that started  In the meantime,  Use ice for 15 minutes  up to 4 times daily and avoid bench pressing

## 2016-07-31 NOTE — Progress Notes (Addendum)
Patient ID: Jeffery Reynolds, male    DOB: 09/02/1933  Age: 82 y.o. MRN: [id number]  The patient is here for routine follow up on  chronic and acute problems. He has not had his annual wellness visit scheduled yet   The risk factors are reflected in the social history.  The roster of all physicians providing medical care to patient - is listed in the Snapshot section of the chart.  Activities of daily living:  The patient is 100% independent in all ADLs: dressing, toileting, feeding as well as independent mobility  Home safety : The patient has smoke detectors in the home. They wear seatbelts.  There are no firearms at home. There is no violence in the home.   There is no risks for hepatitis, STDs or HIV. There is no   history of blood transfusion. They have no travel history to infectious disease endemic areas of the world.  The patient has seen their dentist in the last six month. They have seen their eye doctor in the last year. They admit to slight hearing difficulty with regard to whispered voices and some television programs.  They have deferred audiologic testing in the last year.  They do not  have excessive sun exposure. Discussed the need for sun protection: hats, long sleeves and use of sunscreen if there is significant sun exposure.   Diet: the importance of a healthy diet is discussed. They do have a healthy diet.  The benefits of regular aerobic exercise were discussed. He lifts weights fot 2 hours 3 times per week.  Cannot walk or ride bike due ot knee pain .   Depression screen: there are no signs or vegative symptoms of depression- irritability, change in appetite, anhedonia, sadness/tearfullness.  Cognitive assessment: the patient manages all their financial and personal affairs and is actively engaged, but has chronic cognitive impairment which he states has been present since he was in his 20's.  They could relate day,date,year and events; recalled 2/3 objects at 3 minutes;  performed clock-face test normally.  The following portions of the patient's history were reviewed and updated as appropriate: allergies, current medications, past family history, past medical history,  past surgical history, past social history  and problem list.  Visual acuity was not assessed per patient preference since she has regular follow up with her ophthalmologist. Hearing and body mass index were assessed and reviewed.   During the course of the visit the patient was educated and counseled about appropriate screening and preventive services including : fall prevention , diabetes screening, nutrition counseling, colorectal cancer screening, and recommended immunizations.    CC: The primary encounter diagnosis was Mixed hyperlipidemia. 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    Pain in  lower back both improved when he stopped bench pressing.  Still having pain in base of right thumb interfering with writing , using hand at all.  Discussed using voltaren gel for joint pain given need to avoid oral  NSAIDs Needs podiatry referral for dystrophic nails   Needs compression stockings needed for management of venous stasis/edema   cologuard  Discussed as colon ca screening option given age  Labs ordered for today   History Lionel has a past medical history of A-fib ([place]) (2000); Arthritis; CAD (coronary  artery disease); Colon polyps; Genital warts; GERD (gastroesophageal reflux disease); Gout; History of blood transfusion; History of chicken pox; Hyperlipidemia; Hypertension; Seasonal allergies; Sleep apnea, obstructive; and 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   He has a past surgical history that  includes Coronary artery bypass graft (1995); Knee surgery; Rotator cuff repair; Gallbladder surgery; Cardiac catheterization; Tonsillectomy and adenoidectomy (1943); and Gynecomastia excision.   His family history includes Alcohol abuse in his father and son; Cancer in his brother and mother; Crohn's disease in his daughter; Dementia in his brother; Diabetes in his brother; Heart attack (age of onset: 30) in his father; Heart attack (age of onset: 90) in his paternal grandfather; Heart attack (age of onset: 26) in his paternal grandmother; Heart disease in his father; Hyperlipidemia in his father; Hypertension in his father.He reports that he has never smoked. He does not have any smokeless tobacco history on file. He reports that he drinks alcohol. He reports that he does not use drugs.  Outpatient Medications Prior to Visit  Medication Sig Dispense Refill  . allopurinol (ZYLOPRIM) 300 MG tablet Take on Monday, Wednesday and Friday. 90 tablet 1  . ALPRAZolam (XANAX) 0.25 MG tablet Take 1 tablet (0.25 mg total) by mouth at bedtime as needed. for sleep 30 tablet 2  . atorvastatin (LIPITOR) 40 MG tablet TAKE 1 TABLET EVERY DAY 90 tablet 1  . azelastine (OPTIVAR) 0.05 % ophthalmic solution Place 1 drop into both eyes 2 (two) times daily. 6 mL 5  . fluticasone (FLONASE) 50 MCG/ACT nasal spray Place 2 sprays into both nostrils daily. 16 g 6  . HYDROcodone-acetaminophen (NORCO/VICODIN) 5-325 MG tablet Take 1 tablet once daily as needed. 30 tablet 0  . metoprolol (LOPRESSOR) 50 MG tablet TAKE 1 TABLET(50 MG) BY MOUTH TWICE DAILY 180 tablet 3  . Omeprazole Magnesium (PRILOSEC OTC PO) Take by mouth daily.    . sildenafil (VIAGRA) 100 MG tablet Take 100 mg by mouth daily as needed for erectile dysfunction.    . traMADol (ULTRAM) 50 MG tablet Take 1 tablet (50 mg total) by mouth every 6 (six) hours as needed. Maximum 3 daily 90 tablet 5  . traZODone (DESYREL) 150 MG tablet TAKE 1 TABLET AT BEDTIME 90 tablet 1   . warfarin (COUMADIN) 5 MG tablet TAKE 1 TABLET BY MOUTH DAILY OR AS DIRECTED BY COUMADIN CLINIC 90 tablet 1   No facility-administered medications prior to visit.     Review of Systems   Patient denies headache, fevers, malaise, unintentional weight loss, skin rash, eye pain, sinus congestion and sinus pain, sore throat, dysphagia,  hemoptysis , cough, dyspnea, wheezing, chest pain, palpitations, orthopnea, , abdominal pain, nausea, melena, diarrhea, constipation, flank pain, dysuria, hematuria, urinary  Frequency, nocturia, numbness, tingling, seizures,  Focal weakness, Loss of consciousness,  Tremor, , depression, anxiety, and suicidal ideation.      Objective:  BP 140/72   Pulse 63   Temp 97.5 F (36.4 C) (Oral)   Resp 12   Ht _0  (1.727 m)   Wt 212 lb 8 oz (96.4 kg)   SpO2 98%   BMI 32.31 kg/m   Physical Exam   General appearance: alert, cooperative and appears stated age Head: Normocephalic, without obvious abnormality, atraumatic Eyes: conjunctivae/corneas clear. PERRL, EOM's intact. Fundi benign. Ears: normal TM's and external ear canals both ears Nose: Nares normal. Septum midline. Mucosa normal. No drainage or sinus tenderness. Throat: lips, mucosa, and tongue normal; teeth and gums normal Neck: no adenopathy, no carotid bruit, no JVD, supple,  symmetrical, trachea midline and thyroid not enlarged, symmetric, no tenderness/mass/nodules Lungs: clear to auscultation bilaterally Breasts: normal appearance, no masses or tenderness Heart: regular rate and rhythm, S1, S2 normal, no murmur, click, rub or gallop Abdomen: soft, non-tender; bowel sounds normal; no masses,  no organomegaly Extremities: extremities normal, atraumatic, no cyanosis. 1+ pitting edema  or edema Pulses: 2+ and symmetric Skin: brawny skin changes,  Dystrophic nails great toes bilaterally.   Neurologic: Alert and oriented X 3, normal strength and tone. Normal symmetric reflexes. Normal coordination  and gait.   MSK: right thumb cmc joint tender, enlarged, not warm or red.   left knee crepitus without effusion.      Assessment & Plan:   Problem List Items Addressed This Visit    Encounter for therapeutic drug monitoring   Relevant Orders   Uric acid   Comp Met (CMET)   Hyperlipidemia - Primary    Managed with Lipitor goal LDL 70.  He left without going to labs.  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 He appears to be taking his medications correctly and has never missed an appointment however.       Osteoarthritis of carpometacarpal [facility]) joint of right thumb    Trial of voltaren gel      Thrombocytopenia ([facility])   Relevant Orders   CBC w/Diff   Tubular adenoma of colon    Last colonoscopy was positive for ta in 2011.  Given his age, will use cologuard to screen       Venous stasis of both lower extremities    Wit 1+ pitting edema.  rx furosemide every other day,  Daily use of Compression stockings  Advised and rx written       Relevant Medications   furosemide (LASIX) 20 MG tablet    Other Visit Diagnoses    Chronic venous insufficiency       Relevant Medications   furosemide (LASIX) 20 MG tablet   Other Relevant Orders   DME Other see comment   Dystrophic nail       Relevant Orders   Ambulatory referral  to Podiatry     A total of 30 minutes was spent with patient more than half of which was spent in counseling patient on the above mentioned issues ,and coordination of care.  I am having Mr. Feasel start on furosemide and diclofenac sodium. I am also having him maintain his sildenafil, Omeprazole Magnesium (PRILOSEC OTC PO), azelastine, fluticasone, allopurinol, metoprolol, atorvastatin, traZODone, traMADol, HYDROcodone-acetaminophen, warfarin, and ALPRAZolam.  Meds ordered this encounter  Medications  . furosemide (LASIX) 20 MG tablet    Sig: Take 1 tablet (20 mg total) by mouth every other day. For fluid  retention    Dispense:  45 tablet    Refill:  3  . diclofenac sodium (VOLTAREN) 1 % GEL    Sig: Apply 2 g topically 4 (four) times daily.    Dispense:  100 g    Refill:  3    There are no discontinued medications.  Follow-up: No Follow-up on file.   Crecencio Mc, MD

## 2016-07-31 NOTE — Progress Notes (Signed)
Pre-visit discussion using our clinic review tool. No additional management support is needed unless otherwise documented below in the visit note.  

## 2016-07-31 NOTE — Progress Notes (Signed)
cologuard ordered.

## 2016-08-02 DIAGNOSIS — M1811 Unilateral primary osteoarthritis of first carpometacarpal joint, right hand: Secondary | ICD-10-CM | POA: Insufficient documentation

## 2016-08-02 NOTE — Assessment & Plan Note (Signed)
He is increasingly forgetful, and walked off today without going to the lab for bloodwork despite less than 15 minutes of time lapse between my directions and his leaving. He appears to be taking his medications correctly and has never missed an appointment however.

## 2016-08-02 NOTE — Assessment & Plan Note (Signed)
Wit 1+ pitting edema.  rx furosemide every other day,  Daily use of Compression stockings  Advised and rx written

## 2016-08-02 NOTE — Assessment & Plan Note (Signed)
He is limited to exercising above the waist due to severe DJD of left knee. His pain is managed with tramadol daily and vicodin at night.  he has not had any ER visits  And has not requested any early refills.   Refill history was confirmed via  Controlled Substance database by me today during  visit and there have been no prescriptions of controlled substances filled from any providers other than me. Marland Kitchen

## 2016-08-02 NOTE — Addendum Note (Signed)
Addended by: Crecencio Mc on: 08/02/2016 08:19 AM   Modules accepted: Orders

## 2016-08-02 NOTE — Assessment & Plan Note (Signed)
Managed with Lipitor goal LDL 70.  He left without going to labs.  Will re enter lipids and other labs  changes today   Lab Results  Component Value Date   CHOL 114 01/30/2016   HDL 35.90 (L) 01/30/2016   LDLCALC 43 01/30/2016   LDLDIRECT 45.0 09/11/2015   TRIG 175.0 (H) 01/30/2016   CHOLHDL 3 01/30/2016   Lab Results  Component Value Date   ALT 39 01/30/2016   AST 27 01/30/2016   ALKPHOS 86 01/30/2016   BILITOT 1.1 01/30/2016

## 2016-08-02 NOTE — Assessment & Plan Note (Signed)
Last colonoscopy was positive for ta in 2011.  Given his age, will use cologuard to screen

## 2016-08-02 NOTE — Assessment & Plan Note (Signed)
Trial of voltaren gel

## 2016-08-13 ENCOUNTER — Telehealth: Payer: Self-pay | Admitting: Internal Medicine

## 2016-08-13 ENCOUNTER — Ambulatory Visit (INDEPENDENT_AMBULATORY_CARE_PROVIDER_SITE_OTHER): Payer: Medicare Other

## 2016-08-13 DIAGNOSIS — I2510 Atherosclerotic heart disease of native coronary artery without angina pectoris: Secondary | ICD-10-CM

## 2016-08-13 DIAGNOSIS — Z5181 Encounter for therapeutic drug level monitoring: Secondary | ICD-10-CM

## 2016-08-13 DIAGNOSIS — I481 Persistent atrial fibrillation: Secondary | ICD-10-CM

## 2016-08-13 DIAGNOSIS — I4819 Other persistent atrial fibrillation: Secondary | ICD-10-CM

## 2016-08-13 LAB — POCT INR: INR: 1.7

## 2016-08-13 NOTE — Telephone Encounter (Signed)
Pt called back and stated that he found the script and that he was on his way to get it filled.

## 2016-08-13 NOTE — Telephone Encounter (Signed)
Called and advised patient he was given script for 08/09/16 and that script maybe in his house or could be on file with Pharmacy. Patient is checking and will return call if he cannot find the script.

## 2016-08-13 NOTE — Telephone Encounter (Signed)
Pt needs a refill on  HYDROcodone-acetaminophen (NORCO/VICODIN) 5-325 MG sent to [facility] on  [address]

## 2016-08-22 ENCOUNTER — Ambulatory Visit: Payer: Self-pay | Admitting: Podiatry

## 2016-09-03 ENCOUNTER — Ambulatory Visit (INDEPENDENT_AMBULATORY_CARE_PROVIDER_SITE_OTHER): Payer: Medicare Other

## 2016-09-03 DIAGNOSIS — I4819 Other persistent atrial fibrillation: Secondary | ICD-10-CM

## 2016-09-03 DIAGNOSIS — Z5181 Encounter for therapeutic drug level monitoring: Secondary | ICD-10-CM | POA: Diagnosis not present

## 2016-09-03 DIAGNOSIS — I481 Persistent atrial fibrillation: Secondary | ICD-10-CM | POA: Diagnosis not present

## 2016-09-03 LAB — POCT INR: INR: 1.3

## 2016-09-12 ENCOUNTER — Other Ambulatory Visit: Payer: Self-pay | Admitting: Internal Medicine

## 2016-09-12 ENCOUNTER — Telehealth: Payer: Self-pay

## 2016-09-12 MED ORDER — HYDROCODONE-ACETAMINOPHEN 5-325 MG PO TABS: ORAL_TABLET | ORAL | 0 refills | Status: DC

## 2016-09-12 NOTE — Telephone Encounter (Signed)
Last office visit 07/31/16 Next office 09/22/16 with Denisa

## 2016-09-12 NOTE — Telephone Encounter (Signed)
Pt is requesting a refill on HYDROcodone-acetaminophen (NORCO/VICODIN) 5-325 MG tablet. Pt only has 2 pills remaining.

## 2016-09-12 NOTE — Telephone Encounter (Signed)
Patient notified and will schedule appt. Pt picking up Rx

## 2016-09-16 ENCOUNTER — Ambulatory Visit: Payer: Self-pay | Admitting: Cardiovascular Disease

## 2016-09-16 NOTE — Progress Notes (Deleted)
Cardiology Office Note   Date:  09/16/2016   ID:  Jeffery Reynolds, DOB 1933/09/08, MRN [id number]  PCP:  Crecencio Mc, MD  Cardiologist:   Kathlyn Sacramento, MD   No chief complaint on file.     History of Present Illness: Jeffery Reynolds is a 83 y.o. male who presents for a follow-up visit regarding coronary artery disease and atrial fibrillation.  He underwent 6 vessel CABG in 1995 in [place]. He was diagnosed later with atrial fibrillation and was started on anticoagulation with warfarin.  He reports no recurrent ischemic events since his CABG. He has known history of hypertension, hyperlipidemia and previous tobacco use. There is family history of coronary artery disease. He is retired from [employer] (medical).   the patient transitioned into chronic atrial fibrillation but overall was asymptomatic. Thus, I switched him from sotalol to metoprolol during last visit.  He has been doing very well and denies any chest pain or shortness of breath. He goes to the gym 3 times a week with no significant exertional symptoms. He has some mild memory loss. Recent carotid Doppler showed mild nonobstructive atherosclerosis.    Past Medical History:  Diagnosis Date  . A-fib ([place]) 2000      . Arthritis   . CAD (coronary artery disease)   . Colon polyps   . Genital warts   . GERD (gastroesophageal reflux disease)   . Gout    Controlled with Allopurinol  . History of blood transfusion   . History of chicken pox   . Hyperlipidemia   . Hypertension   . Seasonal allergies   . Sleep apnea, obstructive   . Thrombocytopathia [facility])     Past Surgical History:  Procedure Laterality Date  . CARDIAC CATHETERIZATION    . CORONARY ARTERY BYPASS GRAFT  1995   CABG x 6 in [place], [place]    . GYNECOMASTIA EXCISION    . KNEE SURGERY     bilateral   . ROTATOR CUFF REPAIR     right  . TONSILLECTOMY AND ADENOIDECTOMY  1943     Current Outpatient Prescriptions  Medication Sig  Dispense Refill  . allopurinol (ZYLOPRIM) 300 MG tablet Take on Monday, Wednesday and Friday. 90 tablet 1  . ALPRAZolam (XANAX) 0.25 MG tablet Take 1 tablet (0.25 mg total) by mouth at bedtime as needed. for sleep 30 tablet 2  . atorvastatin (LIPITOR) 40 MG tablet TAKE 1 TABLET EVERY DAY 90 tablet 1  . azelastine (OPTIVAR) 0.05 % ophthalmic solution Place 1 drop into both eyes 2 (two) times daily. 6 mL 5  . diclofenac sodium (VOLTAREN) 1 % GEL Apply 2 g topically 4 (four) times daily. 100 g 3  . fluticasone (FLONASE) 50 MCG/ACT nasal spray Place 2 sprays into both nostrils daily. 16 g 6  . furosemide (LASIX) 20 MG tablet Take 1 tablet (20 mg total) by mouth every other day. For fluid retention 45 tablet 3  . HYDROcodone-acetaminophen (NORCO/VICODIN) 5-325 MG tablet Take 1 tablet once daily as needed. 30 tablet 0  . metoprolol (LOPRESSOR) 50 MG tablet TAKE 1 TABLET(50 MG) BY MOUTH TWICE DAILY 180 tablet 3  . Omeprazole Magnesium (PRILOSEC OTC PO) Take by mouth daily.    . sildenafil (VIAGRA) 100 MG tablet Take 100 mg by mouth daily as needed for erectile dysfunction.    . traMADol (ULTRAM) 50 MG tablet Take 1 tablet (50 mg total) by mouth every 6 (six) hours as needed. Maximum  3 daily 90 tablet 5  . traZODone (DESYREL) 150 MG tablet TAKE 1 TABLET AT BEDTIME 90 tablet 1  . warfarin (COUMADIN) 5 MG tablet TAKE 1 TABLET BY MOUTH DAILY OR AS DIRECTED BY COUMADIN CLINIC 90 tablet 1   No current facility-administered medications for this visit.     Allergies:   Patient has no known allergies.    Social History:  The patient  reports that he has never smoked. He does not have any smokeless tobacco history on file. He reports that he drinks alcohol. He reports that he does not use drugs.   Family History:  The patient's ***family history includes Alcohol abuse in his father and son; Cancer in his brother and mother; Crohn's disease in his daughter; Dementia in his brother; Diabetes in his brother;  Heart attack (age of onset: 65) in his father; Heart attack (age of onset: 69) in his paternal grandfather; Heart attack (age of onset: 29) in his paternal grandmother; Heart disease in his father; Hyperlipidemia in his father; Hypertension in his father.    ROS:  Please see the history of present illness.   Otherwise, review of systems are positive for {NONE DEFAULTED:18576::"none"}.   All other systems are reviewed and negative.    PHYSICAL EXAM: VS:  There were no vitals taken for this visit. , BMI There is no height or weight on file to calculate BMI. GEN: Well nourished, well developed, in no acute distress  HEENT: normal  Neck: no JVD, carotid bruits, or masses Cardiac: ***RRR; no murmurs, rubs, or gallops,no edema  Respiratory:  clear to auscultation bilaterally, normal work of breathing GI: soft, nontender, nondistended, + BS MS: no deformity or atrophy  Skin: warm and dry, no rash Neuro:  Strength and sensation are intact Psych: euthymic mood, full affect   EKG:  EKG {ACTION; IS/IS [id number] ordered today. The ekg ordered today demonstrates ***   Recent Labs: 01/30/2016: ALT 39; BUN 22; Creatinine, Ser 0.99; Hemoglobin 14.2; Platelets 97.0 Repeated and verified X2.; Potassium 4.4; Sodium 142    Lipid Panel    Component Value Date/Time   CHOL 114 01/30/2016 0953   CHOL 137 03/24/2014 1048   TRIG 175.0 (H) 01/30/2016 0953   HDL 35.90 (L) 01/30/2016 0953   HDL 42 03/24/2014 1048   CHOLHDL 3 01/30/2016 0953   VLDL 35.0 01/30/2016 0953   LDLCALC 43 01/30/2016 0953   LDLCALC 59 03/24/2014 1048   LDLDIRECT 45.0 09/11/2015 1024      Wt Readings from Last 3 Encounters:  07/31/16 212 lb 8 oz (96.4 kg)  06/13/16 203 lb 12 oz (92.4 kg)  05/26/16 204 lb 12 oz (92.9 kg)      Other studies Reviewed: Additional studies/ records that were reviewed today include: ***. Review of the above records demonstrates: ***  No flowsheet data found.    ASSESSMENT AND  PLAN:  1.  Chronic atrial fibrillation:  2. Coronary artery disease involving native coronary arteries without angina:  3. Essential hypertension:  4. Hyperlipidemia:    Disposition:   FU with *** in {gen number [id number] {Days to years:10300}  Signed,  Kathlyn Sacramento, MD  09/16/2016 2:47 PM    [place]

## 2016-09-17 ENCOUNTER — Encounter: Payer: Self-pay | Admitting: *Deleted

## 2016-09-22 ENCOUNTER — Ambulatory Visit (INDEPENDENT_AMBULATORY_CARE_PROVIDER_SITE_OTHER): Payer: Medicare Other

## 2016-09-22 VITALS — BP 110/62 | HR 66 | Temp 97.9°F | Resp 14 | Ht 68.0 in | Wt 208.1 lb

## 2016-09-22 DIAGNOSIS — Z0000 Encounter for general adult medical examination without abnormal findings: Secondary | ICD-10-CM

## 2016-09-22 NOTE — Patient Instructions (Addendum)
Mr. Jeffery Reynolds , Thank you for taking time to come for your Medicare Wellness Visit. I appreciate your ongoing commitment to your health goals. Please review the following plan we discussed and let me know if I can assist you in the future.   COMPLETE COLOGUARD; FOLLOW AS DIRECTED.  Follow up with Dr. Derrel Nip as needed.  These are the goals we discussed: Goals    . Increase protein intake          Low carb foods. Lean meats.  Leafy green vegetables. Portion control. Educational material provided.        This is a list of the screening recommended for you and due dates:  Health Maintenance  Topic Date Due  . Tetanus Vaccine  11/13/2023  . Flu Shot  Completed  . Shingles Vaccine  Completed  . Pneumonia vaccines  Completed    Colonoscopy, Adult A colonoscopy is an exam to look at the entire large intestine. During the exam, a lubricated, bendable tube is inserted into the anus and then passed into the rectum, colon, and other parts of the large intestine. A colonoscopy is often done as a part of normal colorectal screening or in response to certain symptoms, such as anemia, persistent diarrhea, abdominal pain, and blood in the stool. The exam can help screen for and diagnose medical problems, including:  Tumors.  Polyps.  Inflammation.  Areas of bleeding. Tell a health care provider about:  Any allergies you have.  All medicines you are taking, including vitamins, herbs, eye drops, creams, and over-the-counter medicines.  Any problems you or family members have had with anesthetic medicines.  Any blood disorders you have.  Any surgeries you have had.  Any medical conditions you have.  Any problems you have had passing stool. What are the risks? Generally, this is a safe procedure. However, problems may occur, including:  Bleeding.  A tear in the intestine.  A reaction to medicines given during the exam.  Infection (rare). What happens before the  procedure? Eating and drinking restrictions  Follow instructions from your health care provider about eating and drinking, which may include:  A few days before the procedure - follow a low-fiber diet. Avoid nuts, seeds, dried fruit, raw fruits, and vegetables.  1-3 days before the procedure - follow a clear liquid diet. Drink only clear liquids, such as clear broth or bouillon, black coffee or tea, clear juice, clear soft drinks or sports drinks, gelatin desert, and popsicles. Avoid any liquids that contain red or purple dye.  On the day of the procedure - do not eat or drink anything during the 2 hours before the procedure, or within the time period that your health care provider recommends. Bowel prep  If you were prescribed an oral bowel prep to clean out your colon:  Take it as told by your health care provider. Starting the day before your procedure, you will need to drink a large amount of medicated liquid. The liquid will cause you to have multiple loose stools until your stool is almost clear or light green.  If your skin or anus gets irritated from diarrhea, you may use these to relieve the irritation:  Medicated wipes, such as adult wet wipes with aloe and vitamin E.  A skin soothing-product like petroleum jelly.  If you vomit while drinking the bowel prep, take a break for up to 60 minutes and then begin the bowel prep again. If vomiting continues and you cannot take the bowel prep without  vomiting, call your health care provider. General instructions  Ask your health care provider about changing or stopping your regular medicines. This is especially important if you are taking diabetes medicines or blood thinners.  Plan to have someone take you home from the hospital or clinic. What happens during the procedure?  An IV tube may be inserted into one of your veins.  You will be given medicine to help you relax (sedative).  To reduce your risk of infection:  Your health  care team will wash or sanitize their hands.  Your anal area will be washed with soap.  You will be asked to lie on your side with your knees bent.  Your health care provider will lubricate a long, thin, flexible tube. The tube will have a camera and a light on the end.  The tube will be inserted into your anus.  The tube will be gently eased through your rectum and colon.  Air will be delivered into your colon to keep it open. You may feel some pressure or cramping.  The camera will be used to take images during the procedure.  A small tissue sample may be removed from your body to be examined under a microscope (biopsy). If any potential problems are found, the tissue will be sent to a lab for testing.  If small polyps are found, your health care provider may remove them and have them checked for cancer cells.  The tube that was inserted into your anus will be slowly removed. The procedure may vary among health care providers and hospitals. What happens after the procedure?  Your blood pressure, heart rate, breathing rate, and blood oxygen level will be monitored until the medicines you were given have worn off.  Do not drive for 24 hours after the exam.  You may have a small amount of blood in your stool.  You may pass gas and have mild abdominal cramping or bloating due to the air that was used to inflate your colon during the exam.  It is up to you to get the results of your procedure. Ask your health care provider, or the department performing the procedure, when your results will be ready. This information is not intended to replace advice given to you by your health care provider. Make sure you discuss any questions you have with your health care provider. Document Released: 08/08/2000 Document Revised: 02/29/2016 Document Reviewed: 10/23/2015 Elsevier Interactive Patient Education  2017 [employer].

## 2016-09-22 NOTE — Progress Notes (Addendum)
Subjective:   Jeffery Reynolds is a 83 y.o. male who presents for Medicare Annual/Subsequent preventive examination.  Review of Systems:  No ROS.  Medicare Wellness Visit.  Cardiac Risk Factors include: advanced age (>67men, >74 women);male gender;hypertension     Objective:    Vitals: BP 110/62 (BP Location: Left Arm, Patient Position: Sitting, Cuff Size: Normal)   Pulse 66   Temp 97.9 F (36.6 C) (Oral)   Resp 14   Ht 5\' 8"  (1.727 m)   Wt 208 lb 1.9 oz (94.4 kg)   SpO2 99%   BMI 31.64 kg/m   Body mass index is 31.64 kg/m.  Tobacco History  Smoking Status  . Never Smoker  Smokeless Tobacco  . Never Used     Counseling given: Not Answered   Past Medical History:  Diagnosis Date  . A-fib ([place]) 2000      . Arthritis   . CAD (coronary artery disease)   . Colon polyps   . Genital warts   . GERD (gastroesophageal reflux disease)   . Gout    Controlled with Allopurinol  . History of blood transfusion   . History of chicken pox   . Hyperlipidemia   . Hypertension   . Seasonal allergies   . Sleep apnea, obstructive   . 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 CARDIAC CATHETERIZATION    . CORONARY ARTERY BYPASS GRAFT  1995   CABG x 6 in [place], [place]    . GYNECOMASTIA EXCISION    . KNEE SURGERY     bilateral   . ROTATOR CUFF REPAIR     right  . TONSILLECTOMY AND ADENOIDECTOMY  1943   Family History  Problem Relation Age of Onset  . Heart disease Father   . Heart attack Father 40    MI  . Alcohol abuse Father   . Hyperlipidemia Father   . Hypertension Father   . Heart attack Paternal Grandfather 52  . Alcohol abuse Paternal Grandfather   . Cancer Mother     Breast cancer and Lung Cancer  . Diabetes Brother   . Dementia Brother   . Crohn's disease Daughter   . Cancer Brother     lung cancer  . Alcohol abuse Son   . Alcohol abuse Maternal Grandfather   . Alcohol abuse Son   . Heart  attack Paternal Grandmother 79   History  Sexual Activity  . Sexual activity: Not Currently    Outpatient Encounter Prescriptions as of 09/22/2016  Medication Sig  . allopurinol (ZYLOPRIM) 300 MG tablet Take on Monday, Wednesday and Friday.  . ALPRAZolam (XANAX) 0.25 MG tablet Take 1 tablet (0.25 mg total) by mouth at bedtime as needed. for sleep  . atorvastatin (LIPITOR) 40 MG tablet TAKE 1 TABLET EVERY DAY  . azelastine (OPTIVAR) 0.05 % ophthalmic solution Place 1 drop into both eyes 2 (two) times daily.  . diclofenac sodium (VOLTAREN) 1 % GEL Apply 2 g topically 4 (four) times daily.  . fluticasone (FLONASE) 50 MCG/ACT nasal spray Place 2 sprays into both nostrils daily.  . furosemide (LASIX) 20 MG tablet Take 1 tablet (20 mg total) by mouth every other day. For fluid retention  . HYDROcodone-acetaminophen (NORCO/VICODIN) 5-325 MG tablet Take 1 tablet once daily as needed.  . metoprolol (LOPRESSOR) 50 MG tablet TAKE 1 TABLET(50 MG) BY MOUTH TWICE DAILY  . Omeprazole Magnesium (PRILOSEC OTC PO) Take by mouth daily.  Marland Kitchen  sildenafil (VIAGRA) 100 MG tablet Take 100 mg by mouth daily as needed for erectile dysfunction.  . traMADol (ULTRAM) 50 MG tablet Take 1 tablet (50 mg total) by mouth every 6 (six) hours as needed. Maximum 3 daily  . traZODone (DESYREL) 150 MG tablet TAKE 1 TABLET AT BEDTIME  . warfarin (COUMADIN) 5 MG tablet TAKE 1 TABLET BY MOUTH DAILY OR AS DIRECTED BY COUMADIN CLINIC   No facility-administered encounter medications on file as of 09/22/2016.     Activities of Daily Living In your present state of health, do you have any difficulty performing the following activities: 09/22/2016  Hearing? N  Vision? N  Difficulty concentrating or making decisions? Y  Walking or climbing stairs? Y  Dressing or bathing? N  Doing errands, shopping? N  Preparing Food and eating ? N  Using the Toilet? N  In the past six months, have you accidently leaked urine? N  Do you have problems  with loss of bowel control? N  Managing your Medications? N  Managing your Finances? N  Housekeeping or managing your Housekeeping? Y  Some recent data might be hidden    Patient Care Team: Crecencio Mc, MD as PCP - General (Internal Medicine)   Assessment:    This is a routine wellness examination for Jeffery Reynolds. The goal of the wellness visit is to assist the patient how to close the gaps in care and create a preventative care plan for the patient.   Osteoporosis risk reviewed.  Medications reviewed; taking without issues or barriers.  Safety issues reviewed; smoke detectors in the home. No firearms in the home. Wears seatbelts when driving or riding with others. No violence in the home.  No identified risk were noted; The patient was oriented x 3; appropriate in dress and manner and no objective failures at ADL's or IADL's.   BMI; discussed the importance of a healthy diet, water intake and exercise. Educational material provided.  HTN; followed by PCP.  Health maintenance gaps; closed.  Patient Concerns: Encouraged to complete Cologuard; follow instructions as directed.    Exercise Activities and Dietary recommendations Current Exercise Habits: Structured exercise class, Type of exercise: calisthenics;strength training/weights;stretching;walking, Time (Minutes): > 60, Intensity: Moderate  Goals    . Increase protein intake          Low carb foods. Lean meats.  Leafy green vegetables. Portion control. Educational material provided.       Fall Risk Fall Risk  09/22/2016 01/30/2016 09/11/2015 07/30/2015 04/11/2015  Falls in the past year? Yes No No No No  Number falls in past yr: 1 - - - -  Injury with Fall? No - - - -  Follow up Falls prevention discussed - - - -   Depression Screen PHQ 2/9 Scores 09/22/2016 01/30/2016 09/11/2015 07/30/2015  PHQ - 2 Score 0 0 0 0    Cognitive Function MMSE - Mini Mental State Exam 09/22/2016 09/11/2015  Orientation to time 5 5    Orientation to Place 5 5  Registration 3 3  Attention/ Calculation 5 5  Recall 1 3  Recall-comments 1 out of 3 words recalled -  Language- name 2 objects 2 2  Language- repeat 1 1  Language- follow 3 step command 3 3  Language- read & follow direction 1 1  Write a sentence 1 1  Copy design 1 1  Total score 28 30        Immunization History  Administered Date(s) Administered  . Influenza, High Dose  Seasonal PF 05/26/2016  . Influenza,inj,Quad PF,36+ Mos 06/10/2014  . Influenza-Unspecified 06/30/2015  . Pneumococcal Conjugate-13 08/10/2013  . Pneumococcal Polysaccharide-23 08/14/2014  . Tdap 11/12/2013  . Zoster 04/19/2014   Screening Tests Health Maintenance  Topic Date Due  . TETANUS/TDAP  11/13/2023  . INFLUENZA VACCINE  Completed  . ZOSTAVAX  Completed  . PNA vac Low Risk Adult  Completed      Plan:   End of life planning; Advance aging; Advanced directives discussed. Copy of current HCPOA/Living Will requested.  Medicare Attestation I have personally reviewed: The patient's medical and social history Their use of alcohol, tobacco or illicit drugs Their current medications and supplements The patient's functional ability including ADLs,fall risks, home safety risks, cognitive, and hearing and visual impairment Diet and physical activities Evidence for depression   The patient's weight, height, BMI, and visual acuity have been recorded in the chart.  I have made referrals and provided education to the patient based on review of the above and I have provided the patient with a written personalized care plan for preventive services.    During the course of the visit the patient was educated and counseled about the following appropriate screening and preventive services:   Vaccines to include Pneumoccal, Influenza, Hepatitis B, Td, Zostavax, HCV  Electrocardiogram  Cardiovascular Disease  Colorectal cancer screening  Diabetes screening  Prostate Cancer  Screening  Glaucoma screening  Nutrition counseling   Smoking cessation counseling  Patient Instructions (the written plan) was given to the patient.    OBrien-Blaney, Yelitza Reach L, LPN  [id number]   I have reviewed the above information and agree with above.   Deborra Medina, MD

## 2016-09-25 NOTE — Progress Notes (Signed)
  I have reviewed the above information and agree with above.   Svetlana Bagby, MD 

## 2016-10-14 DIAGNOSIS — Z1212 Encounter for screening for malignant neoplasm of rectum: Secondary | ICD-10-CM | POA: Diagnosis not present

## 2016-10-14 DIAGNOSIS — Z1211 Encounter for screening for malignant neoplasm of colon: Secondary | ICD-10-CM | POA: Diagnosis not present

## 2016-10-14 LAB — COLOGUARD: Cologuard: NEGATIVE

## 2016-10-15 ENCOUNTER — Ambulatory Visit (INDEPENDENT_AMBULATORY_CARE_PROVIDER_SITE_OTHER): Payer: Medicare Other

## 2016-10-15 DIAGNOSIS — Z5181 Encounter for therapeutic drug level monitoring: Secondary | ICD-10-CM | POA: Diagnosis not present

## 2016-10-15 DIAGNOSIS — I481 Persistent atrial fibrillation: Secondary | ICD-10-CM

## 2016-10-15 DIAGNOSIS — I4819 Other persistent atrial fibrillation: Secondary | ICD-10-CM

## 2016-10-15 LAB — POCT INR: INR: 2.6

## 2016-10-16 ENCOUNTER — Ambulatory Visit (INDEPENDENT_AMBULATORY_CARE_PROVIDER_SITE_OTHER): Payer: Medicare Other | Admitting: Internal Medicine

## 2016-10-16 ENCOUNTER — Ambulatory Visit
Admission: RE | Admit: 2016-10-16 | Discharge: 2016-10-16 | Disposition: A | Discharge status: Home / Self Care | Payer: Medicare Other | Source: Ambulatory Visit | Attending: Internal Medicine | Admitting: Internal Medicine

## 2016-10-16 ENCOUNTER — Ambulatory Visit (INDEPENDENT_AMBULATORY_CARE_PROVIDER_SITE_OTHER): Payer: Medicare Other

## 2016-10-16 ENCOUNTER — Encounter: Payer: Self-pay | Admitting: Internal Medicine

## 2016-10-16 VITALS — BP 128/80 | HR 90 | Resp 16 | Wt 214.0 lb

## 2016-10-16 DIAGNOSIS — E782 Mixed hyperlipidemia: Secondary | ICD-10-CM

## 2016-10-16 DIAGNOSIS — I878 Other specified disorders of veins: Secondary | ICD-10-CM

## 2016-10-16 DIAGNOSIS — R3911 Hesitancy of micturition: Secondary | ICD-10-CM

## 2016-10-16 DIAGNOSIS — M25562 Pain in left knee: Secondary | ICD-10-CM

## 2016-10-16 DIAGNOSIS — J069 Acute upper respiratory infection, unspecified: Secondary | ICD-10-CM | POA: Diagnosis not present

## 2016-10-16 DIAGNOSIS — I10 Essential (primary) hypertension: Secondary | ICD-10-CM | POA: Diagnosis not present

## 2016-10-16 DIAGNOSIS — M7989 Other specified soft tissue disorders: Secondary | ICD-10-CM | POA: Diagnosis not present

## 2016-10-16 DIAGNOSIS — Z5181 Encounter for therapeutic drug level monitoring: Secondary | ICD-10-CM | POA: Diagnosis not present

## 2016-10-16 DIAGNOSIS — B9789 Other viral agents as the cause of diseases classified elsewhere: Secondary | ICD-10-CM

## 2016-10-16 DIAGNOSIS — R042 Hemoptysis: Secondary | ICD-10-CM | POA: Diagnosis not present

## 2016-10-16 DIAGNOSIS — R2242 Localized swelling, mass and lump, left lower limb: Secondary | ICD-10-CM | POA: Diagnosis not present

## 2016-10-16 DIAGNOSIS — G8929 Other chronic pain: Secondary | ICD-10-CM | POA: Diagnosis not present

## 2016-10-16 DIAGNOSIS — M79662 Pain in left lower leg: Secondary | ICD-10-CM

## 2016-10-16 DIAGNOSIS — R7301 Impaired fasting glucose: Secondary | ICD-10-CM | POA: Diagnosis not present

## 2016-10-16 DIAGNOSIS — D696 Thrombocytopenia, unspecified: Secondary | ICD-10-CM | POA: Diagnosis not present

## 2016-10-16 LAB — CBC WITH DIFFERENTIAL/PLATELET
Basophils Absolute: 0 10*3/uL (ref 0.0–0.1)
Basophils Relative: 0.3 % (ref 0.0–3.0)
Eosinophils Absolute: 0.1 10*3/uL (ref 0.0–0.7)
Eosinophils Relative: 1.6 % (ref 0.0–5.0)
HCT: 41.2 % (ref 39.0–52.0)
Hemoglobin: 13.9 g/dL (ref 13.0–17.0)
Lymphocytes Relative: 11.6 % — ABNORMAL LOW (ref 12.0–46.0)
Lymphs Abs: 0.5 10*3/uL — ABNORMAL LOW (ref 0.7–4.0)
MCHC: 33.6 g/dL (ref 30.0–36.0)
MCV: 99 fl (ref 78.0–100.0)
Monocytes Absolute: 0.8 10*3/uL (ref 0.1–1.0)
Monocytes Relative: 16.9 % — ABNORMAL HIGH (ref 3.0–12.0)
Neutro Abs: 3.3 10*3/uL (ref 1.4–7.7)
Neutrophils Relative %: 69.6 % (ref 43.0–77.0)
Platelets: 89 10*3/uL — ABNORMAL LOW (ref 150.0–400.0)
RBC: 4.16 Mil/uL — ABNORMAL LOW (ref 4.22–5.81)
RDW: 14.8 % (ref 11.5–15.5)
WBC: 4.7 10*3/uL (ref 4.0–10.5)

## 2016-10-16 LAB — COMPREHENSIVE METABOLIC PANEL
ALT: 41 U/L (ref 0–53)
AST: 23 U/L (ref 0–37)
Albumin: 4 g/dL (ref 3.5–5.2)
Alkaline Phosphatase: 91 U/L (ref 39–117)
BUN: 19 mg/dL (ref 6–23)
CO2: 32 mEq/L (ref 19–32)
Calcium: 8.8 mg/dL (ref 8.4–10.5)
Chloride: 109 mEq/L (ref 96–112)
Creatinine, Ser: 1.02 mg/dL (ref 0.40–1.50)
GFR: 74.48 mL/min (ref >60.00)
Glucose, Bld: 120 mg/dL — ABNORMAL HIGH (ref 70–99)
Potassium: 3.7 mEq/L (ref 3.5–5.1)
Sodium: 145 mEq/L (ref 135–145)
Total Bilirubin: 0.9 mg/dL (ref 0.2–1.2)
Total Protein: 5.9 g/dL — ABNORMAL LOW (ref 6.0–8.3)

## 2016-10-16 LAB — LIPID PANEL
Cholesterol: 110 mg/dL (ref 0–200)
HDL: 36.2 mg/dL — ABNORMAL LOW (ref >39.00)
LDL Cholesterol: 45 mg/dL (ref 0–99)
NonHDL: 74.2
Total CHOL/HDL Ratio: 3
Triglycerides: 144 mg/dL (ref 0.0–149.0)
VLDL: 28.8 mg/dL (ref 0.0–40.0)

## 2016-10-16 LAB — URIC ACID: Uric Acid, Serum: 5.3 mg/dL (ref 4.0–7.8)

## 2016-10-16 MED ORDER — GUAIFENESIN-CODEINE 100-10 MG/5ML PO SYRP: 5.0000 mL | ORAL_SOLUTION | Freq: Three times a day (TID) | ORAL | 0 refills | Status: DC | PRN

## 2016-10-16 MED ORDER — ALPRAZOLAM 0.25 MG PO TABS: 0.2500 mg | ORAL_TABLET | Freq: Every evening | ORAL | 2 refills | Status: DC | PRN

## 2016-10-16 MED ORDER — PREDNISONE 10 MG PO TABS: ORAL_TABLET | ORAL | 0 refills | Status: DC

## 2016-10-16 MED ORDER — HYDROCODONE-ACETAMINOPHEN 5-325 MG PO TABS: ORAL_TABLET | ORAL | 0 refills | Status: DC

## 2016-10-16 NOTE — Patient Instructions (Addendum)
I am ordering an ultrasound to evaluate your left calf pain    For your morning grogginess and urinary problems :  Suspend  trazodone  For one month   continue the alprazolam   Try using vaseline to coat the inside of your nose every night so it doesn't dry out

## 2016-10-16 NOTE — Progress Notes (Signed)
Pre visit review using our clinic review tool, if applicable. No additional management support is needed unless otherwise documented below in the visit note. 

## 2016-10-16 NOTE — Progress Notes (Addendum)
Subjective:  Patient ID: Jeffery Reynolds, male    DOB: 1933/11/24  Age: 83 y.o. MRN: [id number]  CC: The primary encounter diagnosis was Pain of left calf. Diagnoses of Cough with hemoptysis, Thrombocytopenia ([place]), Encounter for therapeutic drug monitoring, Mixed hyperlipidemia, Chronic pain of left knee, Venous stasis of both lower extremities, Essential hypertension, Urinary hesitancy, Viral URI with cough, and Impaired fasting glucose were also pertinent to this visit.  HPI Jeffery Reynolds presents for follow up on hypertension, hyperlipidemia,  Chronic pain and insomnia managed with 150 mg trazodone  multiple complaints today   1)  productive cough x 2 week,s  blood tinged , also seeing it in nasal drainage,  Which  Has been blood streaked for the last week. Worse in the morning,  Denies sinus pain .  Does not use water  In his CPAP reservoir at night   2) Had an episode of severe acute pain  Involving the posterior left knee and calf. Occurred while getting out of his ecar and radiated to ankle.   Also radiating up his thigh.  Improved with exercise but has not resolved  Left calf feels tight and swollen  3) Left knee replacement occurred in 2008, again in 2009 out of town but has seen Psychologist, sport and exercise Jeffery Reynolds in [place] within the past year   4) morning urinary hesitancy and weak stream,  Improves after first void if he drinks water.  Med side effect suspected  5) back pain .  Still doing bench presses at the gym.  Does not arch back      Lab Results  Component Value Date   INR 2.6 10/15/2016   INR 1.3 09/03/2016   INR 1.7 08/13/2016      Outpatient Medications Prior to Visit  Medication Sig Dispense Refill  . allopurinol (ZYLOPRIM) 300 MG tablet Take on Monday, Wednesday and Friday. 90 tablet 1  . atorvastatin (LIPITOR) 40 MG tablet TAKE 1 TABLET EVERY DAY 90 tablet 1  . azelastine (OPTIVAR) 0.05 % ophthalmic solution Place 1 drop into both eyes 2 (two) times daily. 6 mL 5  .  diclofenac sodium (VOLTAREN) 1 % GEL Apply 2 g topically 4 (four) times daily. 100 g 3  . fluticasone (FLONASE) 50 MCG/ACT nasal spray Place 2 sprays into both nostrils daily. 16 g 6  . furosemide (LASIX) 20 MG tablet Take 1 tablet (20 mg total) by mouth every other day. For fluid retention 45 tablet 3  . metoprolol (LOPRESSOR) 50 MG tablet TAKE 1 TABLET(50 MG) BY MOUTH TWICE DAILY 180 tablet 3  . Omeprazole Magnesium (PRILOSEC OTC PO) Take by mouth daily.    . sildenafil (VIAGRA) 100 MG tablet Take 100 mg by mouth daily as needed for erectile dysfunction.    . traMADol (ULTRAM) 50 MG tablet Take 1 tablet (50 mg total) by mouth every 6 (six) hours as needed. Maximum 3 daily 90 tablet 5  . traZODone (DESYREL) 150 MG tablet TAKE 1 TABLET AT BEDTIME 90 tablet 1  . warfarin (COUMADIN) 5 MG tablet TAKE 1 TABLET BY MOUTH DAILY OR AS DIRECTED BY COUMADIN CLINIC 90 tablet 1  . ALPRAZolam (XANAX) 0.25 MG tablet Take 1 tablet (0.25 mg total) by mouth at bedtime as needed. for sleep 30 tablet 2  . HYDROcodone-acetaminophen (NORCO/VICODIN) 5-325 MG tablet Take 1 tablet once daily as needed. 30 tablet 0   No facility-administered medications prior to visit.     Review of Systems;  Patient denies headache, fevers, malaise,  unintentional weight loss, skin rash, eye pain, sinus congestion and sinus pain, sore throat, dysphagia,  hemoptysis , cough, dyspnea, wheezing, chest pain, palpitations, orthopnea, edema, abdominal pain, nausea, melena, diarrhea, constipation, flank pain, dysuria, hematuria, urinary  Frequency, nocturia, numbness, tingling, seizures,  Focal weakness, Loss of consciousness,  Tremor, insomnia, depression, anxiety, and suicidal ideation.      Objective:  BP 128/80   Pulse 90   Resp 16   Wt 214 lb (97.1 kg)   SpO2 97%   BMI 32.54 kg/m   BP Readings from Last 3 Encounters:  10/16/16 128/80  09/22/16 110/62  07/31/16 140/72    Wt Readings from Last 3 Encounters:  10/16/16 214 lb  (97.1 kg)  09/22/16 208 lb 1.9 oz (94.4 kg)  07/31/16 212 lb 8 oz (96.4 kg)    General appearance: alert, cooperative and appears stated age Ears: normal TM's and external ear canals both ears Throat: lips, mucosa, and tongue normal; teeth and gums normal Neck: no adenopathy, no carotid bruit, supple, symmetrical, trachea midline and thyroid not enlarged, symmetric, no tenderness/mass/nodules Back: symmetric, no curvature. ROM normal. No CVA tenderness. Lungs: clear to auscultation bilaterally Heart: regular rate and rhythm, S1, S2 normal, no murmur, click, rub or gallop Abdomen: soft, non-tender; bowel sounds normal; no masses,  no organomegaly Pulses: 2+ and symmetric Skin: Skin color, texture, turgor normal. No rashes or lesions Lymph nodes: Cervical, supraclavicular, and axillary nodes normal.  Lab Results  Component Value Date   HGBA1C 5.1 04/18/2014    Lab Results  Component Value Date   CREATININE 1.02 10/16/2016   CREATININE 0.99 01/30/2016   CREATININE 1.11 09/11/2015    Lab Results  Component Value Date   WBC 4.7 10/16/2016   HGB 13.9 10/16/2016   HCT 41.2 10/16/2016   PLT 89.0 (L) 10/16/2016   GLUCOSE 120 (H) 10/16/2016   CHOL 110 10/16/2016   TRIG 144.0 10/16/2016   HDL 36.20 (L) 10/16/2016   LDLDIRECT 45.0 09/11/2015   LDLCALC 45 10/16/2016   ALT 41 10/16/2016   AST 23 10/16/2016   NA 145 10/16/2016   K 3.7 10/16/2016   CL 109 10/16/2016   CREATININE 1.02 10/16/2016   BUN 19 10/16/2016   CO2 32 10/16/2016   TSH 2.43 09/11/2015   INR 2.6 10/15/2016   HGBA1C 5.1 04/18/2014    Mr Brain Wo Contrast  Result Date: 06/26/2015 CLINICAL DATA:  Short term memory loss.  Cognitive decline. EXAM: MRI HEAD WITHOUT CONTRAST TECHNIQUE: Multiplanar, multiecho pulse sequences of the brain and surrounding structures were obtained without intravenous contrast. COMPARISON:  None. FINDINGS: Moderate generalized cerebral atrophy. Mild ventricular enlargement consistent  with atrophy. Chronic infarct left frontal lobe. No other significant chronic ischemia Negative for acute infarct. Chronic micro hemorrhage right paracentral pons and left corona radiata. No fluid collection. Negative for mass or edema. Paranasal sinuses are clear. Normal orbit. Pituitary normal in size. IMPRESSION: Moderate atrophy. Chronic left frontal infarct. Chronic micro hemorrhage. No acute abnormality. Electronically Signed   By: Franchot Gallo M.D.   On: 06/26/2015 10:45    Assessment & Plan:   Problem List Items Addressed This Visit    Chronic pain of left knee    He has limited his exercising to walking and bench pressing due to severe DJD of left knee. His pain is managed with tramadol daily and vicodin at night.  he has not had any ER visits  And has not requested any early refills.   Refill history was  confirmed via [place] Controlled Substance database by me today during  visit and there have been no prescriptions of controlled substances filled from any providers other than me. .       Relevant Medications   HYDROcodone-acetaminophen (NORCO/VICODIN) 5-325 MG tablet   predniSONE (DELTASONE) 10 MG tablet   Cough with hemoptysis    Chest x ray today is clear.  Source of bleeding is likely his sinuses which are getting dried out from cpap use . Advised to add water or use vaseline in nose.       Relevant Orders   DG Chest 2 View (Completed)   Encounter for therapeutic drug monitoring   Essential hypertension    Well controlled on current regimen. Renal function stable, no changes today.      Hyperlipidemia   Impaired fasting glucose    Needs POC a1c next visit       Relevant Orders   Hemoglobin A1c   Comprehensive metabolic panel   Pain of left calf - Primary    Acute, occurred during a twisting incident of knee.  ultrasound negative for dvt .  Suspect pulled muscle vs ligament sprain or tear.  Ice ,  Activity modification,  Sports medicine referral if no better in 2 weeks        Relevant Orders   US Venous Img Lower Unilateral Left (Completed)   Thrombocytopenia ([facility])     Stable by surveillance.  No BM biopsy done unless they are < 50 K.   Lab Results  Component Value Date   WBC 4.7 10/16/2016   HGB 13.9 10/16/2016   HCT 41.2 10/16/2016   MCV 99.0 10/16/2016   PLT 89.0 (L) 10/16/2016         Urinary hesitancy    Symptoms occur only in the morninh suggesting medication side effect,  Will  suspend trazodone for a month.      Venous stasis of both lower extremities    Improved edema with  rx furosemide every other day,  Daily use of Compression stockings    Lab Results  Component Value Date   CREATININE 1.02 10/16/2016   Lab Results  Component Value Date   NA 145 10/16/2016   K 3.7 10/16/2016   CL 109 10/16/2016   CO2 32 10/16/2016         Viral URI with cough    URI is most likely viral given the mild HEENT Symptoms  And normal exam.   I have explained that in viral URIS, an antibiotic will not help the symptoms and will increase the risk of developing diarrhea.,  Continue oral and nasal decongestants,prn tylenol 650 mq 8 hrs for aches and pains,  Sinus flushes with Milta Deiters Med's rinse,  prednisone  taper for inflammation, and cough suppressant.            I am having Jeffery Reynolds start on predniSONE and guaiFENesin-codeine. I am also having him maintain his sildenafil, Omeprazole Magnesium (PRILOSEC OTC PO), azelastine, fluticasone, allopurinol, metoprolol, atorvastatin, traZODone, traMADol, warfarin, furosemide, diclofenac sodium, HYDROcodone-acetaminophen, and ALPRAZolam.  Meds ordered this encounter  Medications  . HYDROcodone-acetaminophen (NORCO/VICODIN) 5-325 MG tablet    Sig: Take 1 tablet once daily as needed.    Dispense:  30 tablet    Refill:  0    Refill on or after  October 09 2016  . ALPRAZolam (XANAX) 0.25 MG tablet    Sig: Take 1 tablet (0.25 mg total) by mouth at bedtime as needed. for sleep  Dispense:  30 tablet     Refill:  2  . predniSONE (DELTASONE) 10 MG tablet    Sig: 6 tablets on Day 1 , then reduce by 1 tablet daily until gone    Dispense:  21 tablet    Refill:  0  . guaiFENesin-codeine (CHERATUSSIN AC) 100-10 MG/5ML syrup    Sig: Take 5 mLs by mouth 3 (three) times daily as needed for cough.    Dispense:  120 mL    Refill:  0   A total of 40 minutes was spent with patient more than half of which was spent in counseling patient on the above mentioned issues , reviewing and explaining recent labs and imaging studies done, and coordination of care. Medications Discontinued During This Encounter  Medication Reason  . ALPRAZolam (XANAX) 0.25 MG tablet Patient has not taken in last 30 days  . HYDROcodone-acetaminophen (NORCO/VICODIN) 5-325 MG tablet Reorder    Follow-up: No Follow-up on file.   Crecencio Mc, MD

## 2016-10-17 ENCOUNTER — Telehealth: Payer: Self-pay | Admitting: Internal Medicine

## 2016-10-17 NOTE — Telephone Encounter (Signed)
Pt called requesting results of some testing that was done yesterday. Please advise, thank you!  Call pt @ [phone]

## 2016-10-17 NOTE — Telephone Encounter (Signed)
Pt returned call with concerns of his test results  Pt contact [phone]

## 2016-10-17 NOTE — Telephone Encounter (Signed)
If his calf and knee are bothering him he should avoid squats AND LUNGES AND JUST WALK UNTIL IT FEELS BETTER SORRY ABOUT THE CAPS

## 2016-10-17 NOTE — Telephone Encounter (Signed)
I called pt- I informed him of his 10/16/16 LLE Venous US. ( negative for DVT)  He wants to know if he can resume his exercising as before.   Please advise.

## 2016-10-18 DIAGNOSIS — M79662 Pain in left lower leg: Secondary | ICD-10-CM | POA: Insufficient documentation

## 2016-10-18 DIAGNOSIS — R3911 Hesitancy of micturition: Secondary | ICD-10-CM | POA: Insufficient documentation

## 2016-10-18 DIAGNOSIS — R040 Epistaxis: Secondary | ICD-10-CM | POA: Insufficient documentation

## 2016-10-18 NOTE — Assessment & Plan Note (Addendum)
Acute, occurred during a twisting incident of knee.  ultrasound negative for dvt .  Suspect pulled muscle vs ligament sprain or tear.  Ice ,  Activity modification,  Sports medicine referral if no better in 2 weeks

## 2016-10-18 NOTE — Assessment & Plan Note (Signed)
Improved edema with  rx furosemide every other day,  Daily use of Compression stockings    Lab Results  Component Value Date   CREATININE 1.02 10/16/2016   Lab Results  Component Value Date   NA 145 10/16/2016   K 3.7 10/16/2016   CL 109 10/16/2016   CO2 32 10/16/2016

## 2016-10-18 NOTE — Assessment & Plan Note (Signed)
Symptoms occur only in the morninh suggesting medication side effect,  Will  suspend trazodone for a month.

## 2016-10-18 NOTE — Assessment & Plan Note (Signed)
Stable by surveillance.  No BM biopsy done unless they are < 50 K.   Lab Results  Component Value Date   WBC 4.7 10/16/2016   HGB 13.9 10/16/2016   HCT 41.2 10/16/2016   MCV 99.0 10/16/2016   PLT 89.0 (L) 10/16/2016

## 2016-10-18 NOTE — Assessment & Plan Note (Signed)
URI is most likely viral given the mild HEENT Symptoms  And normal exam.   I have explained that in viral URIS, an antibiotic will not help the symptoms and will increase the risk of developing diarrhea.,  Continue oral and nasal decongestants,prn tylenol 650 mq 8 hrs for aches and pains,  Sinus flushes with Neil Med's rinse,  prednisone  taper for inflammation, and cough suppressant.    

## 2016-10-18 NOTE — Assessment & Plan Note (Signed)
He has limited his exercising to walking and bench pressing due to severe DJD of left knee. His pain is managed with tramadol daily and vicodin at night.  he has not had any ER visits  And has not requested any early refills.   Refill history was confirmed via [place] Controlled Substance database by me today during  visit and there have been no prescriptions of controlled substances filled from any providers other than me. Marland Kitchen

## 2016-10-18 NOTE — Assessment & Plan Note (Signed)
Chest x ray today is clear.  Source of bleeding is likely his sinuses which are getting dried out from cpap use . Advised to add water or use vaseline in nose.

## 2016-10-18 NOTE — Assessment & Plan Note (Signed)
Well controlled on current regimen. Renal function stable, no changes today. 

## 2016-10-19 DIAGNOSIS — R7301 Impaired fasting glucose: Secondary | ICD-10-CM | POA: Insufficient documentation

## 2016-10-19 NOTE — Assessment & Plan Note (Signed)
Needs POC a1c next visit

## 2016-10-19 NOTE — Addendum Note (Signed)
Addended by: Crecencio Mc on: 10/19/2016 09:27 AM   Modules accepted: Orders

## 2016-10-20 NOTE — Telephone Encounter (Signed)
Pt informed

## 2016-10-22 ENCOUNTER — Telehealth: Payer: Self-pay | Admitting: Internal Medicine

## 2016-10-22 DIAGNOSIS — I739 Peripheral vascular disease, unspecified: Secondary | ICD-10-CM | POA: Diagnosis not present

## 2016-10-22 DIAGNOSIS — S86812A Strain of other muscle(s) and tendon(s) at lower leg level, left leg, initial encounter: Secondary | ICD-10-CM | POA: Diagnosis not present

## 2016-10-22 DIAGNOSIS — M7122 Synovial cyst of popliteal space [Baker], left knee: Secondary | ICD-10-CM | POA: Diagnosis not present

## 2016-10-22 MED ORDER — AMOXICILLIN-POT CLAVULANATE 875-125 MG PO TABS: 1.0000 | ORAL_TABLET | Freq: Two times a day (BID) | ORAL | 0 refills | Status: DC

## 2016-10-22 MED ORDER — GUAIFENESIN-CODEINE 100-10 MG/5ML PO SYRP: 5.0000 mL | ORAL_SOLUTION | Freq: Three times a day (TID) | ORAL | 0 refills | Status: DC | PRN

## 2016-10-22 NOTE — Telephone Encounter (Signed)
Patient advised of below and verbalized understanding.    Script for [employer]  faxed to [employer].  Also advised Zoe , pharmacy technician they will assist patient in finding probiotic .

## 2016-10-22 NOTE — Telephone Encounter (Signed)
Pt called and stated that he spilt about a 1/5 of his guaiFENesin-codeine (CHERATUSSIN AC) 100-10 MG/5ML syrup. He states that he has about 1 more dose that is left in the bottle, he is still having congestion. PT would like to know if he should get more or would he be ok with what he has left. Please advise, thank you!  Call pt @ [phone]

## 2016-10-22 NOTE — Telephone Encounter (Signed)
Pt requested a returned call at [phone]

## 2016-10-22 NOTE — Telephone Encounter (Signed)
It is a controlled substance so his pharmacy may not allow him to refill it,  But we will try.   am adding an antibiotic since his cough is still so productive augmentin twice daily x 7 days    Please take a probiotic ( Align, Floraque or Culturelle),for a minimum of 3 weeks to prevent a serious antibiotic associated diarrhea  Called clostridium dificile colitis.

## 2016-10-22 NOTE — Telephone Encounter (Signed)
Patient calls stating when he went to take his medicine last night spilt his Cheratussin AC , he enough for maybe 2 more doses.  He state he continues to have congestion in nasal and chest.  Coughing up yellow to orange colored mucus no fever.   Please advise.

## 2016-10-23 ENCOUNTER — Encounter: Payer: Self-pay | Admitting: Internal Medicine

## 2016-10-23 ENCOUNTER — Telehealth: Payer: Self-pay | Admitting: Internal Medicine

## 2016-10-23 NOTE — Telephone Encounter (Signed)
The results of patient's cologuard is negative.   We will repeat  In  3 years For colon CA screening. Thanks for abstracting.

## 2016-10-24 NOTE — Telephone Encounter (Signed)
Left detailed mess informing pt of below.  

## 2016-10-28 ENCOUNTER — Encounter: Payer: Self-pay | Admitting: Cardiovascular Disease

## 2016-10-28 ENCOUNTER — Ambulatory Visit (INDEPENDENT_AMBULATORY_CARE_PROVIDER_SITE_OTHER): Payer: Medicare Other | Admitting: Cardiovascular Disease

## 2016-10-28 VITALS — BP 118/62 | HR 99 | Ht 68.0 in | Wt 208.8 lb

## 2016-10-28 DIAGNOSIS — I482 Chronic atrial fibrillation, unspecified: Secondary | ICD-10-CM

## 2016-10-28 DIAGNOSIS — I10 Essential (primary) hypertension: Secondary | ICD-10-CM | POA: Diagnosis not present

## 2016-10-28 DIAGNOSIS — I2510 Atherosclerotic heart disease of native coronary artery without angina pectoris: Secondary | ICD-10-CM | POA: Diagnosis not present

## 2016-10-28 NOTE — Progress Notes (Signed)
Cardiology Office Note   Date:  10/28/2016   ID:  Jeffery Reynolds, DOB [birth year], MRN [id number]  PCP:  Jeffery Mc, MD  Cardiologist:   Jeffery Sacramento, MD   Chief Complaint  Patient presents with  . other    12 month follow up. Patient states he is doing well. Meds reviewed verbally with patient.       History of Present Illness: Jeffery Reynolds is a 83 y.o. male who presents for a follow-up visit regarding coronary artery disease and chronic atrial fibrillation. He is status post 6 vessel CABG in 1995 in [place]. He is being treated with rate control and anticoagulation for atrial fibrillation. He had recent bronchitis with hemoptysis but that has resolved completely. He also had recent left calf swelling with negative venous Doppler for DVT. He is exercising regularly with no exertional symptoms.  Past Medical History:  Diagnosis Date  . A-fib ([place]) 2000      . Arthritis   . CAD (coronary artery disease)   . Colon polyps   . Genital warts   . GERD (gastroesophageal reflux disease)   . Gout    Controlled with Allopurinol  . History of blood transfusion   . History of chicken pox   . Hyperlipidemia   . Hypertension   . Seasonal allergies   . Sleep apnea, obstructive   . 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 CARDIAC CATHETERIZATION    . CORONARY ARTERY BYPASS GRAFT  1995   CABG x 6 in [place], [place]    . GYNECOMASTIA EXCISION    . KNEE SURGERY     bilateral   . ROTATOR CUFF REPAIR     right  . TONSILLECTOMY AND ADENOIDECTOMY  1943     Current Outpatient Prescriptions  Medication Sig Dispense Refill  . allopurinol (ZYLOPRIM) 300 MG tablet Take on Monday, Wednesday and Friday. 90 tablet 1  . ALPRAZolam (XANAX) 0.25 MG tablet Take 1 tablet (0.25 mg total) by mouth at bedtime as needed. for sleep 30 tablet 2  . atorvastatin (LIPITOR) 40 MG tablet TAKE 1 TABLET EVERY DAY 90 tablet 1  .  azelastine (OPTIVAR) 0.05 % ophthalmic solution Place 1 drop into both eyes 2 (two) times daily. 6 mL 5  . guaiFENesin-codeine (CHERATUSSIN AC) 100-10 MG/5ML syrup Take 5 mLs by mouth 3 (three) times daily as needed for cough. 120 mL 0  . HYDROcodone-acetaminophen (NORCO/VICODIN) 5-325 MG tablet Take 1 tablet once daily as needed. 30 tablet 0  . metoprolol (LOPRESSOR) 50 MG tablet TAKE 1 TABLET(50 MG) BY MOUTH TWICE DAILY 180 tablet 3  . Omeprazole Magnesium (PRILOSEC OTC PO) Take by mouth daily.    . sildenafil (VIAGRA) 100 MG tablet Take 100 mg by mouth daily as needed for erectile dysfunction.    . traMADol (ULTRAM) 50 MG tablet Take 1 tablet (50 mg total) by mouth every 6 (six) hours as needed. Maximum 3 daily 90 tablet 5  . traZODone (DESYREL) 150 MG tablet TAKE 1 TABLET AT BEDTIME 90 tablet 1  . warfarin (COUMADIN) 5 MG tablet TAKE 1 TABLET BY MOUTH DAILY OR AS DIRECTED BY COUMADIN CLINIC 90 tablet 1   No current facility-administered medications for this visit.     Allergies:   Patient has no known allergies.    Social History:  The patient  reports that he has never smoked. He has never used smokeless tobacco.  He reports that he drinks alcohol. He reports that he does not use drugs.   Family History:  The patient's family history includes Alcohol abuse in his father, maternal grandfather, paternal grandfather, son, and son; Cancer in his brother and mother; Crohn's disease in his daughter; Dementia in his brother; Diabetes in his brother; Heart attack (age of onset: 36) in his father; Heart attack (age of onset: 49) in his paternal grandfather; Heart attack (age of onset: 36) in his paternal grandmother; Heart disease in his father; Hyperlipidemia in his father; Hypertension in his father.    ROS:  Please see the history of present illness.   Otherwise, review of systems are positive for none.   All other systems are reviewed and negative.    PHYSICAL EXAM: VS:  BP 118/62 (BP  Location: Left Arm, Patient Position: Sitting, Cuff Size: Normal)   Pulse 99   Ht 5\' 8"  (1.727 m)   Wt 208 lb 12 oz (94.7 kg)   BMI 31.74 kg/m  , BMI Body mass index is 31.74 kg/m. GEN: Well nourished, well developed, in no acute distress  HEENT: normal  Neck: no JVD, carotid bruits, or masses Cardiac: Irregularly irregular; no murmurs, rubs, or gallops,no edema  Respiratory:  clear to auscultation bilaterally, normal work of breathing GI: soft, nontender, nondistended, + BS MS: no deformity or atrophy  Skin: warm and dry, no rash Neuro:  Strength and sensation are intact Psych: euthymic mood, full affect   EKG:  EKG is ordered today. The ekg ordered today demonstrates atrial fibrillation with ventricular rate of 99 bpm. No significant ST or T wave changes.   Recent Labs: 10/16/2016: ALT 41; BUN 19; Creatinine, Ser 1.02; Hemoglobin 13.9; Platelets 89.0; Potassium 3.7; Sodium 145    Lipid Panel    Component Value Date/Time   CHOL 110 10/16/2016 1126   CHOL 137 03/24/2014 1048   TRIG 144.0 10/16/2016 1126   HDL 36.20 (L) 10/16/2016 1126   HDL 42 03/24/2014 1048   CHOLHDL 3 10/16/2016 1126   VLDL 28.8 10/16/2016 1126   LDLCALC 45 10/16/2016 1126   LDLCALC 59 03/24/2014 1048   LDLDIRECT 45.0 09/11/2015 1024      Wt Readings from Last 3 Encounters:  10/28/16 208 lb 12 oz (94.7 kg)  10/16/16 214 lb (97.1 kg)  09/22/16 208 lb 1.9 oz (94.4 kg)      No flowsheet data found.    ASSESSMENT AND PLAN:  1.  Chronic atrial fibrillation:  He is doing well overall with reasonably controlled ventricular rate. He appears to be asymptomatic. He is tolerating anticoagulation with warfarin. He does have thrombocytopenia that has been stable.  2. Coronary artery disease involving native coronary arteries without angina: Continue medical therapy.  3. Essential hypertension: Blood pressure is controlled on current medications.  4. Hyperlipidemia: Continue treatment with  atorvastatin. Most recent LDL was 45.   Disposition:   FU with me in 1 year  Signed,  Jeffery Sacramento, MD  10/28/2016 5:17 PM    [place]

## 2016-10-28 NOTE — Patient Instructions (Signed)
Medication Instructions: Continue same medications.   Labwork: None.   Procedures/Testing: None.   Follow-Up: 1 year with Dr. Arida.   Any Additional Special Instructions Will Be Listed Below (If Applicable).     If you need a refill on your cardiac medications before your next appointment, please call your pharmacy.   

## 2016-11-05 ENCOUNTER — Ambulatory Visit (INDEPENDENT_AMBULATORY_CARE_PROVIDER_SITE_OTHER): Payer: Medicare Other

## 2016-11-05 DIAGNOSIS — Z5181 Encounter for therapeutic drug level monitoring: Secondary | ICD-10-CM

## 2016-11-05 DIAGNOSIS — I481 Persistent atrial fibrillation: Secondary | ICD-10-CM

## 2016-11-05 DIAGNOSIS — I4819 Other persistent atrial fibrillation: Secondary | ICD-10-CM

## 2016-11-05 DIAGNOSIS — I2510 Atherosclerotic heart disease of native coronary artery without angina pectoris: Secondary | ICD-10-CM

## 2016-11-05 LAB — POCT INR: INR: 1.7

## 2016-11-12 ENCOUNTER — Telehealth: Payer: Self-pay | Admitting: Internal Medicine

## 2016-11-12 DIAGNOSIS — I70219 Atherosclerosis of native arteries of extremities with intermittent claudication, unspecified extremity: Secondary | ICD-10-CM

## 2016-11-12 NOTE — Telephone Encounter (Signed)
Patient advised of below , advised per [place] to limit exercise to moderate activity.  Patient verbalized an understanding.

## 2016-11-12 NOTE — Telephone Encounter (Signed)
Pt called and stated that he is starting that he is starting to have that cramp in his left calf. Please advise, thank you!  Call pt @ [phone]

## 2016-11-12 NOTE — Telephone Encounter (Signed)
Patient calls stating left leg to upper calf knee to ankle bothering him.  He states symptoms  cleared up for 2-3 days. Symptoms returned  He on Monday when he walks he calf begins to cramp.     When siting it  doesn't bother him only cramps when walking. Always bothered by lower back pain.  Drinking plenty of fluid and Gatorade.  He was negative for DVT   Please advise.

## 2016-11-12 NOTE — Telephone Encounter (Signed)
This sounds like claudication if it is brought on by exericse and relieved with rest. I am sending him to vascular surgery to make sure he doesn't have a blocked artery in his upper leg that is causing decreased blood flow to the lower leg (DVT is a blocked vein)

## 2016-11-13 ENCOUNTER — Telehealth: Payer: Self-pay | Admitting: Internal Medicine

## 2016-11-13 MED ORDER — HYDROCODONE-ACETAMINOPHEN 5-325 MG PO TABS: ORAL_TABLET | ORAL | 0 refills | Status: DC

## 2016-11-13 NOTE — Telephone Encounter (Signed)
Refilled 10/16/2016 Last OV: 10/28/2016 Next OV: 01/26/2017

## 2016-11-13 NOTE — Telephone Encounter (Signed)
March, April and May refills authorized .  If they did not print ,  Ok to reprint for signing.

## 2016-11-13 NOTE — Telephone Encounter (Signed)
Pt called requesting a refill on his HYDROcodone-acetaminophen (NORCO/VICODIN) 5-325 MG tablet. Please advise, thank you!  Call pt @ [phone]

## 2016-11-14 MED ORDER — HYDROCODONE-ACETAMINOPHEN 5-325 MG PO TABS: ORAL_TABLET | ORAL | 0 refills | Status: DC

## 2016-11-14 NOTE — Telephone Encounter (Signed)
Rxs printed and put in quick sign folder to be signed. Once signed they will be placed up front for pick up.

## 2016-11-14 NOTE — Addendum Note (Signed)
Addended by: Adair Laundry on: 11/14/2016 09:28 AM   Modules accepted: Orders

## 2016-11-26 ENCOUNTER — Ambulatory Visit (INDEPENDENT_AMBULATORY_CARE_PROVIDER_SITE_OTHER): Payer: Medicare Other

## 2016-11-26 DIAGNOSIS — I4819 Other persistent atrial fibrillation: Secondary | ICD-10-CM

## 2016-11-26 DIAGNOSIS — I481 Persistent atrial fibrillation: Secondary | ICD-10-CM

## 2016-11-26 DIAGNOSIS — Z5181 Encounter for therapeutic drug level monitoring: Secondary | ICD-10-CM | POA: Diagnosis not present

## 2016-11-26 DIAGNOSIS — I70219 Atherosclerosis of native arteries of extremities with intermittent claudication, unspecified extremity: Secondary | ICD-10-CM

## 2016-11-26 LAB — POCT INR: INR: 1.7

## 2016-12-09 ENCOUNTER — Ambulatory Visit (INDEPENDENT_AMBULATORY_CARE_PROVIDER_SITE_OTHER): Payer: Medicare Other | Admitting: Vascular Surgery

## 2016-12-09 ENCOUNTER — Encounter (INDEPENDENT_AMBULATORY_CARE_PROVIDER_SITE_OTHER): Payer: Self-pay | Admitting: Vascular Surgery

## 2016-12-09 VITALS — BP 109/68 | HR 60 | Resp 17 | Ht 68.0 in | Wt 210.0 lb

## 2016-12-09 DIAGNOSIS — I10 Essential (primary) hypertension: Secondary | ICD-10-CM

## 2016-12-09 DIAGNOSIS — I70219 Atherosclerosis of native arteries of extremities with intermittent claudication, unspecified extremity: Secondary | ICD-10-CM

## 2016-12-09 DIAGNOSIS — E782 Mixed hyperlipidemia: Secondary | ICD-10-CM

## 2016-12-09 DIAGNOSIS — M79662 Pain in left lower leg: Secondary | ICD-10-CM

## 2016-12-09 NOTE — Progress Notes (Signed)
Patient ID: Jeffery Reynolds, male   DOB: September 27, 1933, 83 y.o.   MRN: [id number]  Chief Complaint  Patient presents with  . New Patient (Initial Visit)    HPI Jeffery Reynolds is a 83 y.o. male.  I am asked to see the patient by Dr. Derrel Nip for evaluation of left leg pain worrisome for claudication.  The patient reports cramping in the left leg that left him unable to walk.  This happened about 2 months ago and over about a week's time resolved. Then a week or 2 later began to come back. It is better today although he still has some aching and burning in his left calf with activity. He had a negative DVT study. He has had multiple previous surgeries on that leg before and has significant stasis changes. He does not have open ulceration or infection. He has no right leg symptoms. The swelling in that left leg is actually pretty mild. With the etiology somewhat unclear, he was referred for a vascular evaluation for possible peripheral arterial disease given his multiple atherosclerotic risk factors.   Past Medical History:  Diagnosis Date  . A-fib ([place]) 2000      . Arthritis   . CAD (coronary artery disease)   . Colon polyps   . Genital warts   . GERD (gastroesophageal reflux disease)   . Gout    Controlled with Allopurinol  . History of blood transfusion   . History of chicken pox   . Hyperlipidemia   . Hypertension   . Seasonal allergies   . Sleep apnea, obstructive   . 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 CARDIAC CATHETERIZATION    . CORONARY ARTERY BYPASS GRAFT  1995   CABG x 6 in [place], [place]    . GYNECOMASTIA EXCISION    . KNEE SURGERY     bilateral   . ROTATOR CUFF REPAIR     right  . TONSILLECTOMY AND ADENOIDECTOMY  1943    Family History  Problem Relation Age of Onset  . Heart disease Father   . Heart attack Father 51    MI  . Alcohol abuse Father   . Hyperlipidemia Father   . Hypertension  Father   . Heart attack Paternal Grandfather 79  . Alcohol abuse Paternal Grandfather   . Cancer Mother     Breast cancer and Lung Cancer  . Diabetes Brother   . Dementia Brother   . Crohn's disease Daughter   . Cancer Brother     lung cancer  . Alcohol abuse Son   . Alcohol abuse Maternal Grandfather   . Alcohol abuse Son   . Heart attack Paternal Grandmother 71    Social History Social History  Substance Use Topics  . Smoking status: Never Smoker  . Smokeless tobacco: Never Used  . Alcohol use Yes     Comment: occasional   No IV drug use  No Known Allergies  Current Outpatient Prescriptions  Medication Sig Dispense Refill  . allopurinol (ZYLOPRIM) 300 MG tablet Take on Monday, Wednesday and Friday. 90 tablet 1  . ALPRAZolam (XANAX) 0.25 MG tablet Take 1 tablet (0.25 mg total) by mouth at bedtime as needed. for sleep 30 tablet 2  . atorvastatin (LIPITOR) 40 MG tablet TAKE 1 TABLET EVERY DAY 90 tablet 1  . azelastine (OPTIVAR) 0.05 % ophthalmic solution Place 1 drop into both eyes 2 (two) times daily.  6 mL 5  . guaiFENesin-codeine (CHERATUSSIN AC) 100-10 MG/5ML syrup Take 5 mLs by mouth 3 (three) times daily as needed for cough. 120 mL 0  . HYDROcodone-acetaminophen (NORCO/VICODIN) 5-325 MG tablet Take 1 tablet once daily as needed. 30 tablet 0  . HYDROcodone-acetaminophen (NORCO/VICODIN) 5-325 MG tablet Take 1 tablet once daily as needed. 30 tablet 0  . HYDROcodone-acetaminophen (NORCO/VICODIN) 5-325 MG tablet Take 1 tablet once daily as needed. 30 tablet 0  . metoprolol (LOPRESSOR) 50 MG tablet TAKE 1 TABLET(50 MG) BY MOUTH TWICE DAILY 180 tablet 3  . Omeprazole Magnesium (PRILOSEC OTC PO) Take by mouth daily.    . sildenafil (VIAGRA) 100 MG tablet Take 100 mg by mouth daily as needed for erectile dysfunction.    . traMADol (ULTRAM) 50 MG tablet Take 1 tablet (50 mg total) by mouth every 6 (six) hours as needed. Maximum 3 daily 90 tablet 5  . traZODone (DESYREL) 150 MG  tablet TAKE 1 TABLET AT BEDTIME 90 tablet 1  . warfarin (COUMADIN) 5 MG tablet TAKE 1 TABLET BY MOUTH DAILY OR AS DIRECTED BY COUMADIN CLINIC 90 tablet 1   No current facility-administered medications for this visit.       REVIEW OF SYSTEMS (Negative unless checked)  Constitutional: '[]'$ Weight loss  '[]'$ Fever  '[]'$ Chills Cardiac: '[]'$ Chest pain   '[]'$ Chest pressure   '[x]'$ Palpitations   '[]'$ Shortness of breath when laying flat   '[]'$ Shortness of breath at rest   '[x]'$ Shortness of breath with exertion. Vascular:  '[x]'$ Pain in legs with walking   '[x]'$ Pain in legs at rest   '[]'$ Pain in legs when laying flat   '[]'$ Claudication   '[]'$ Pain in feet when walking  '[]'$ Pain in feet at rest  '[]'$ Pain in feet when laying flat   '[]'$ History of DVT   '[]'$ Phlebitis   '[]'$ Swelling in legs   '[]'$ Varicose veins   '[]'$ Non-healing ulcers Pulmonary:   '[]'$ Uses home oxygen   '[]'$ Productive cough   '[]'$ Hemoptysis   '[]'$ Wheeze  '[]'$ COPD   '[]'$ Asthma Neurologic:  '[]'$ Dizziness  '[]'$ Blackouts   '[]'$ Seizures   '[]'$ History of stroke   '[]'$ History of TIA  '[]'$ Aphasia   '[]'$ Temporary blindness   '[]'$ Dysphagia   '[]'$ Weakness or numbness in arms   '[]'$ Weakness or numbness in legs Musculoskeletal:  '[x]'$ Arthritis   '[]'$ Joint swelling   '[]'$ Joint pain   '[]'$ Low back pain Hematologic:  '[]'$ Easy bruising  '[]'$ Easy bleeding   '[]'$ Hypercoagulable state   '[]'$ Anemic  '[]'$ Hepatitis Gastrointestinal:  '[]'$ Blood in stool   '[]'$ Vomiting blood  '[]'$ Gastroesophageal reflux/heartburn   '[]'$ Abdominal pain Genitourinary:  '[]'$ Chronic kidney disease   '[]'$ Difficult urination  '[]'$ Frequent urination  '[]'$ Burning with urination   '[]'$ Hematuria Skin:  '[]'$ Rashes   '[]'$ Ulcers   '[]'$ Wounds Psychological:  '[]'$ History of anxiety   '[]'$  History of major depression.    Physical Exam BP 109/68   Pulse 60   Resp 17   Ht '5\' 8"'$  (1.727 m)   Wt 210 lb (95.3 kg)   BMI 31.93 kg/m  Gen:  WD/WN, NAD Head: [place]/AT, No temporalis wasting.  Ear/Nose/Throat: Hearing grossly intact, nares w/o erythema or drainage, oropharynx w/o Erythema/Exudate Eyes: Conjunctiva clear,  sclera non-icteric  Neck: trachea midline.  No JVD.  Pulmonary:  Good air movement, no use of accessory muscles, respirations not labored Cardiac: Irregularly irregular Vascular:  Vessel Right Left  Radial Palpable Palpable  Ulnar Palpable Palpable  Brachial Palpable Palpable  Carotid Palpable, without bruit Palpable, without bruit  Aorta Not palpable N/A  Femoral Palpable Palpable  Popliteal Palpable 1+ Palpable  PT 1+  Palpable Not Palpable  DP Palpable 1+ Palpable   Gastrointestinal: soft, non-tender/non-distended.  Musculoskeletal: M/S 5/5 throughout.  Moderate stasis changes in the left lower extremity with 1+ edema. Multiple healed surgical scars. Neurologic: Sensation grossly intact in extremities.  Symmetrical.  Speech is fluent. Motor exam as listed above. Psychiatric: Judgment intact, Mood & affect appropriate for pt's clinical situation. Dermatologic: No rashes or ulcers noted.  No cellulitis or open wounds. Lymph : No Cervical, Axillary, or Inguinal lymphadenopathy.   Radiology No results found.  Labs Recent Results (from the past 2160 hour(s))  Cologuard     Status: None   Collection Time: 10/14/16 12:00 AM  Result Value Ref Range   Cologuard Negative   POCT INR     Status: None   Collection Time: 10/15/16 11:21 AM  Result Value Ref Range   INR 2.6   CBC w/Diff     Status: Abnormal   Collection Time: 10/16/16 11:26 AM  Result Value Ref Range   WBC 4.7 4.0 - 10.5 K/uL   RBC 4.16 (L) 4.22 - 5.81 Mil/uL   Hemoglobin 13.9 13.0 - 17.0 g/dL   HCT 41.2 39.0 - 52.0 %   MCV 99.0 78.0 - 100.0 fl   MCHC 33.6 30.0 - 36.0 g/dL   RDW 14.8 11.5 - 15.5 %   Platelets 89.0 (L) 150.0 - 400.0 K/uL   Neutrophils Relative % 69.6 43.0 - 77.0 %   Lymphocytes Relative 11.6 (L) 12.0 - 46.0 %   Monocytes Relative 16.9 (H) 3.0 - 12.0 %   Eosinophils Relative 1.6 0.0 - 5.0 %   Basophils Relative 0.3 0.0 - 3.0 %   Neutro Abs 3.3 1.4 - 7.7 K/uL   Lymphs Abs 0.5 (L) 0.7 - 4.0 K/uL     Monocytes Absolute 0.8 0.1 - 1.0 K/uL   Eosinophils Absolute 0.1 0.0 - 0.7 K/uL   Basophils Absolute 0.0 0.0 - 0.1 K/uL  Uric acid     Status: None   Collection Time: 10/16/16 11:26 AM  Result Value Ref Range   Uric Acid, Serum 5.3 4.0 - 7.8 mg/dL  Lipid Profile     Status: Abnormal   Collection Time: 10/16/16 11:26 AM  Result Value Ref Range   Cholesterol 110 0 - 200 mg/dL    Comment: ATP III Classification       Desirable:  < 200 mg/dL               Borderline High:  200 - 239 mg/dL          High:  > = 240 mg/dL   Triglycerides 144.0 0.0 - 149.0 mg/dL    Comment: Normal:  <150 mg/dLBorderline High:  150 - 199 mg/dL   HDL 36.20 (L) >39.00 mg/dL   VLDL 28.8 0.0 - 40.0 mg/dL   LDL Cholesterol 45 0 - 99 mg/dL   Total CHOL/HDL Ratio 3     Comment:                Men          Women1/2 Average Risk     3.4          3.3Average Risk          5.0          4.42X Average Risk          9.6          7.13X Average Risk          15.0  11.0                       NonHDL 74.20     Comment: NOTE:  Non-HDL goal should be 30 mg/dL higher than patient's LDL goal (i.e. LDL goal of < 70 mg/dL, would have non-HDL goal of < 100 mg/dL)  Comp Met (CMET)     Status: Abnormal   Collection Time: 10/16/16 11:26 AM  Result Value Ref Range   Sodium 145 135 - 145 mEq/L   Potassium 3.7 3.5 - 5.1 mEq/L   Chloride 109 96 - 112 mEq/L   CO2 32 19 - 32 mEq/L   Glucose, Bld 120 (H) 70 - 99 mg/dL   BUN 19 6 - 23 mg/dL   Creatinine, Ser 1.02 0.40 - 1.50 mg/dL   Total Bilirubin 0.9 0.2 - 1.2 mg/dL   Alkaline Phosphatase 91 39 - 117 U/L   AST 23 0 - 37 U/L   ALT 41 0 - 53 U/L   Total Protein 5.9 (L) 6.0 - 8.3 g/dL   Albumin 4.0 3.5 - 5.2 g/dL   Calcium 8.8 8.4 - 10.5 mg/dL   GFR 74.48 >60.00 mL/min  POCT INR     Status: None   Collection Time: 11/05/16 12:06 PM  Result Value Ref Range   INR 1.7   POCT INR     Status: None   Collection Time: 11/26/16 11:37 AM  Result Value Ref Range   INR 1.7      Assessment/Plan:  Pain of left calf  Recommend:  The patient has atypical pain symptoms for pure atherosclerotic disease. However, on physical exam there is evidence of mixed venous and arterial disease, given the diminished pulses and the edema associated with venous changes of the legs.  Noninvasive studies including ABI's of the legs will be obtained and the patient will follow up with me to review these studies.  The patient should continue walking and begin a more formal exercise program. The patient should continue his antiplatelet therapy and aggressive treatment of the lipid abnormalities.  The patient should begin wearing graduated compression socks 15-20 mmHg strength to control edema.   Hyperlipidemia lipid control important in reducing the progression of atherosclerotic disease. Continue statin therapy   Essential hypertension blood pressure control important in reducing the progression of atherosclerotic disease. On appropriate oral medications.       Leotis Pain 12/09/2016, 12:32 PM   This note was created with Dragon medical transcription system.  Any errors from dictation are unintentional.

## 2016-12-09 NOTE — Assessment & Plan Note (Signed)
Recommend:  The patient has atypical pain symptoms for pure atherosclerotic disease. However, on physical exam there is evidence of mixed venous and arterial disease, given the diminished pulses and the edema associated with venous changes of the legs.  Noninvasive studies including ABI's of the legs will be obtained and the patient will follow up with me to review these studies.  The patient should continue walking and begin a more formal exercise program. The patient should continue his antiplatelet therapy and aggressive treatment of the lipid abnormalities.  The patient should begin wearing graduated compression socks 15-20 mmHg strength to control edema.

## 2016-12-09 NOTE — Assessment & Plan Note (Signed)
blood pressure control important in reducing the progression of atherosclerotic disease. On appropriate oral medications.  

## 2016-12-09 NOTE — Assessment & Plan Note (Signed)
lipid control important in reducing the progression of atherosclerotic disease. Continue statin therapy  

## 2016-12-11 ENCOUNTER — Telehealth: Payer: Self-pay | Admitting: *Deleted

## 2016-12-11 ENCOUNTER — Ambulatory Visit (INDEPENDENT_AMBULATORY_CARE_PROVIDER_SITE_OTHER): Payer: Medicare Other | Admitting: *Deleted

## 2016-12-11 DIAGNOSIS — Z5181 Encounter for therapeutic drug level monitoring: Secondary | ICD-10-CM | POA: Diagnosis not present

## 2016-12-11 DIAGNOSIS — I481 Persistent atrial fibrillation: Secondary | ICD-10-CM | POA: Diagnosis not present

## 2016-12-11 DIAGNOSIS — I4819 Other persistent atrial fibrillation: Secondary | ICD-10-CM

## 2016-12-11 DIAGNOSIS — I70219 Atherosclerosis of native arteries of extremities with intermittent claudication, unspecified extremity: Secondary | ICD-10-CM | POA: Diagnosis not present

## 2016-12-11 LAB — POCT INR: INR: 3.1

## 2016-12-11 MED ORDER — ALPRAZOLAM 0.25 MG PO TABS: 0.2500 mg | ORAL_TABLET | Freq: Every evening | ORAL | 2 refills | Status: DC | PRN

## 2016-12-11 MED ORDER — ALLOPURINOL 300 MG PO TABS: ORAL_TABLET | ORAL | 2 refills | Status: DC

## 2016-12-11 NOTE — Telephone Encounter (Signed)
Script faxed to [facility].

## 2016-12-11 NOTE — Telephone Encounter (Signed)
Correct ,  Needs appt in June.  Other 2 refilled

## 2016-12-11 NOTE — Telephone Encounter (Signed)
Left voice mail to call back 

## 2016-12-11 NOTE — Telephone Encounter (Signed)
Patient requested a medication refill for alprazolam, allopurinol and hydrocodone  Pharmacy [facility] [address]  Pt contact [phone]

## 2016-12-11 NOTE — Progress Notes (Signed)
Spoke with Fuller Canada, PharmD, in [facility]. She advised for patient to continue taking 1 tablet daily except for 1.5 tablet on Mondays and to eat an extra serving of greens today. Then to recheck in 3-4 weeks as soon as patient is back in town from his trip.

## 2016-12-11 NOTE — Telephone Encounter (Signed)
Patient requesting refills, allopurinol was last filled on 01/09/2016, for #90, 1 refill Alprazolam was last filled on 10/16/2016 for #30 with 2 refills Hydrocodone was last refilled on 11/14/2016, given Rx for March, April and may, so this shouldn't be due yet.   Last OV was 10/16/2016. thanks

## 2016-12-12 NOTE — Telephone Encounter (Signed)
Patient on schedule for 01/26/17 at 10:30 patient aware of appointment

## 2016-12-12 NOTE — Telephone Encounter (Signed)
Pt called why the hydrocodone was not filled. Explained to pt why. Pt is going to double check to make sure he doesn't have the other rx's there.   Call pt @ [phone]

## 2016-12-17 NOTE — Telephone Encounter (Signed)
Patient picked up script on 12/12/16 for hydrocodone per [facility].

## 2016-12-24 ENCOUNTER — Ambulatory Visit (INDEPENDENT_AMBULATORY_CARE_PROVIDER_SITE_OTHER): Payer: Self-pay | Admitting: Vascular Surgery

## 2016-12-24 ENCOUNTER — Encounter (INDEPENDENT_AMBULATORY_CARE_PROVIDER_SITE_OTHER): Payer: Self-pay

## 2017-01-14 ENCOUNTER — Ambulatory Visit (INDEPENDENT_AMBULATORY_CARE_PROVIDER_SITE_OTHER): Payer: Medicare Other

## 2017-01-14 DIAGNOSIS — I481 Persistent atrial fibrillation: Secondary | ICD-10-CM

## 2017-01-14 DIAGNOSIS — Z5181 Encounter for therapeutic drug level monitoring: Secondary | ICD-10-CM

## 2017-01-14 DIAGNOSIS — I70219 Atherosclerosis of native arteries of extremities with intermittent claudication, unspecified extremity: Secondary | ICD-10-CM

## 2017-01-14 DIAGNOSIS — I4819 Other persistent atrial fibrillation: Secondary | ICD-10-CM

## 2017-01-14 LAB — POCT INR: INR: 3.3

## 2017-01-23 ENCOUNTER — Other Ambulatory Visit: Payer: Self-pay

## 2017-01-23 MED ORDER — ATORVASTATIN CALCIUM 40 MG PO TABS
40.0000 mg | ORAL_TABLET | Freq: Every day | ORAL | 1 refills | Status: DC
Start: 2017-01-23 — End: 2017-01-23

## 2017-01-23 MED ORDER — TRAZODONE HCL 150 MG PO TABS: 150.0000 mg | ORAL_TABLET | Freq: Every day | ORAL | 1 refills | Status: DC

## 2017-01-23 MED ORDER — ATORVASTATIN CALCIUM 40 MG PO TABS: 40.0000 mg | ORAL_TABLET | Freq: Every day | ORAL | 1 refills | Status: DC

## 2017-01-23 MED ORDER — WARFARIN SODIUM 5 MG PO TABS: ORAL_TABLET | ORAL | 1 refills | Status: DC

## 2017-01-23 MED ORDER — ALPRAZOLAM 0.25 MG PO TABS: 0.2500 mg | ORAL_TABLET | Freq: Every evening | ORAL | 0 refills | Status: DC | PRN

## 2017-01-23 MED ORDER — METOPROLOL TARTRATE 50 MG PO TABS: ORAL_TABLET | ORAL | 3 refills | Status: DC

## 2017-01-23 MED ORDER — ALLOPURINOL 300 MG PO TABS: ORAL_TABLET | ORAL | 2 refills | Status: DC

## 2017-01-23 NOTE — Telephone Encounter (Signed)
Refilled: 12/11/2016 Last OV: 10/16/2016 Next OV: 01/26/2017

## 2017-01-23 NOTE — Telephone Encounter (Signed)
Printed signed and faxed

## 2017-01-23 NOTE — Telephone Encounter (Signed)
Medication has been refilled.

## 2017-01-26 ENCOUNTER — Ambulatory Visit (INDEPENDENT_AMBULATORY_CARE_PROVIDER_SITE_OTHER): Payer: Medicare Other | Admitting: Internal Medicine

## 2017-01-26 ENCOUNTER — Encounter: Payer: Self-pay | Admitting: Internal Medicine

## 2017-01-26 DIAGNOSIS — M25562 Pain in left knee: Secondary | ICD-10-CM | POA: Diagnosis not present

## 2017-01-26 DIAGNOSIS — G4733 Obstructive sleep apnea (adult) (pediatric): Secondary | ICD-10-CM

## 2017-01-26 DIAGNOSIS — R7301 Impaired fasting glucose: Secondary | ICD-10-CM

## 2017-01-26 DIAGNOSIS — G8929 Other chronic pain: Secondary | ICD-10-CM | POA: Diagnosis not present

## 2017-01-26 DIAGNOSIS — R040 Epistaxis: Secondary | ICD-10-CM

## 2017-01-26 DIAGNOSIS — I70219 Atherosclerosis of native arteries of extremities with intermittent claudication, unspecified extremity: Secondary | ICD-10-CM | POA: Diagnosis not present

## 2017-01-26 LAB — COMPREHENSIVE METABOLIC PANEL
ALT: 39 U/L (ref 0–53)
AST: 26 U/L (ref 0–37)
Albumin: 4.1 g/dL (ref 3.5–5.2)
Alkaline Phosphatase: 87 U/L (ref 39–117)
BUN: 26 mg/dL — ABNORMAL HIGH (ref 6–23)
CO2: 31 mEq/L (ref 19–32)
Calcium: 9.6 mg/dL (ref 8.4–10.5)
Chloride: 105 mEq/L (ref 96–112)
Creatinine, Ser: 1.09 mg/dL (ref 0.40–1.50)
GFR: 68.94 mL/min (ref >60.00)
Glucose, Bld: 143 mg/dL — ABNORMAL HIGH (ref 70–99)
Potassium: 3.8 mEq/L (ref 3.5–5.1)
Sodium: 141 mEq/L (ref 135–145)
Total Bilirubin: 0.7 mg/dL (ref 0.2–1.2)
Total Protein: 6.7 g/dL (ref 6.0–8.3)

## 2017-01-26 LAB — HEMOGLOBIN A1C: Hgb A1c MFr Bld: 5.4 % (ref 4.6–6.5)

## 2017-01-26 MED ORDER — HYDROCODONE-ACETAMINOPHEN 5-325 MG PO TABS: ORAL_TABLET | ORAL | 0 refills | Status: DC

## 2017-01-26 NOTE — Assessment & Plan Note (Signed)
Diagnosed by sleep study. he is wearing her CPAP every night a minimum of 6 hours per night and notes improved daytime wakefulness and decreased fatigue  

## 2017-01-26 NOTE — Assessment & Plan Note (Signed)
A1c ordered today  For evaluation

## 2017-01-26 NOTE — Assessment & Plan Note (Signed)
Scant amounts of bleeding noted dur to dry nasal passages aggravated by CPAP.  On coumadin.  Advised to humidify the CPAp

## 2017-01-26 NOTE — Progress Notes (Signed)
Subjective:  Patient ID: Jeffery Reynolds, male    DOB: 1933-09-15  Age: 83 y.o. MRN: [id number]  CC: Diagnoses of Impaired fasting glucose, Frequent nosebleeds, Obstructive sleep apnea, and Chronic pain of left knee were pertinent to this visit.  HPI Jeffery Reynolds presents for follow up on chronic conditions including chronic  knee and lower back pain  managed with tramadol and vicodin (prescribed by me) , and subacute sinus symptoms   Still having blood streaked sinsu drainage,  Denies facial pain or fevers.  Patient is chronically anticoagulated with warfarin and he has been reminded that he had the same complaint in February   He was advised to start using a humidiifer with his CPAP machine . But he has not done this due to fear of infection   Knee and back pain managed with once daily vicodin .    Stays constipated  Stools are hard and very large,  cuaseing his internal hermorrhoids to prolapse  out    Outpatient Medications Prior to Visit  Medication Sig Dispense Refill  . allopurinol (ZYLOPRIM) 300 MG tablet Take on Monday, Wednesday and Friday. 90 tablet 2  . ALPRAZolam (XANAX) 0.25 MG tablet Take 1 tablet (0.25 mg total) by mouth at bedtime as needed. for sleep 30 tablet 0  . atorvastatin (LIPITOR) 40 MG tablet Take 1 tablet (40 mg total) by mouth daily. 90 tablet 1  . HYDROcodone-acetaminophen (NORCO/VICODIN) 5-325 MG tablet Take 1 tablet once daily as needed. 30 tablet 0  . metoprolol tartrate (LOPRESSOR) 50 MG tablet TAKE 1 TABLET(50 MG) BY MOUTH TWICE DAILY 180 tablet 3  . traMADol (ULTRAM) 50 MG tablet Take 1 tablet (50 mg total) by mouth every 6 (six) hours as needed. Maximum 3 daily 90 tablet 5  . traZODone (DESYREL) 150 MG tablet Take 1 tablet (150 mg total) by mouth at bedtime. 90 tablet 1  . warfarin (COUMADIN) 5 MG tablet TAKE 1 TABLET BY MOUTH DAILY OR AS DIRECTED BY COUMADIN CLINIC 90 tablet 1  . HYDROcodone-acetaminophen (NORCO/VICODIN) 5-325 MG tablet Take 1  tablet once daily as needed. 30 tablet 0  . HYDROcodone-acetaminophen (NORCO/VICODIN) 5-325 MG tablet Take 1 tablet once daily as needed. 30 tablet 0  . azelastine (OPTIVAR) 0.05 % ophthalmic solution Place 1 drop into both eyes 2 (two) times daily. (Patient not taking: Reported on 01/26/2017) 6 mL 5  . guaiFENesin-codeine (CHERATUSSIN AC) 100-10 MG/5ML syrup Take 5 mLs by mouth 3 (three) times daily as needed for cough. (Patient not taking: Reported on 01/26/2017) 120 mL 0  . metoprolol tartrate (LOPRESSOR) 50 MG tablet TAKE 1 TABLET(50 MG) BY MOUTH TWICE DAILY (Patient not taking: Reported on 01/26/2017) 180 tablet 3  . Omeprazole Magnesium (PRILOSEC OTC PO) Take by mouth daily.    . sildenafil (VIAGRA) 100 MG tablet Take 100 mg by mouth daily as needed for erectile dysfunction.     No facility-administered medications prior to visit.     Review of Systems;  Patient denies headache, fevers, malaise, unintentional weight loss, skin rash, eye pain, sinus congestion and sinus pain, sore throat, dysphagia,  hemoptysis , cough, dyspnea, wheezing, chest pain, palpitations, orthopnea, edema, abdominal pain, nausea, melena, diarrhea, constipation, flank pain, dysuria, hematuria, urinary  Frequency, nocturia, numbness, tingling, seizures,  Focal weakness, Loss of consciousness,  Tremor, insomnia, depression, anxiety, and suicidal ideation.      Objective:  BP 110/78 (BP Location: Left Arm, Patient Position: Sitting, Cuff Size: Normal)   Pulse (!) 108  Temp 98.2 F (36.8 C) (Oral)   Resp 16   Ht 5\' 8"  (1.727 m)   Wt 204 lb 12.8 oz (92.9 kg)   SpO2 96%   BMI 31.14 kg/m   BP Readings from Last 3 Encounters:  01/26/17 110/78  12/09/16 109/68  10/28/16 118/62    Wt Readings from Last 3 Encounters:  01/26/17 204 lb 12.8 oz (92.9 kg)  12/09/16 210 lb (95.3 kg)  10/28/16 208 lb 12 oz (94.7 kg)    General appearance: alert, cooperative and appears stated age Ears: normal TM's and external ear  canals both ears Throat: lips, mucosa, and tongue normal; teeth and gums normal Neck: no adenopathy, no carotid bruit, supple, symmetrical, trachea midline and thyroid not enlarged, symmetric, no tenderness/mass/nodules Back: symmetric, no curvature. ROM normal. No CVA tenderness. Lungs: clear to auscultation bilaterally Heart: regular rate and rhythm, S1, S2 normal, no murmur, click, rub or gallop Abdomen: soft, non-tender; bowel sounds normal; no masses,  no organomegaly Pulses: 2+ and symmetric Skin: Skin color, texture, turgor normal. No rashes or lesions Lymph nodes: Cervical, supraclavicular, and axillary nodes normal.  Lab Results  Component Value Date   HGBA1C 5.1 04/18/2014    Lab Results  Component Value Date   CREATININE 1.02 10/16/2016   CREATININE 0.99 01/30/2016   CREATININE 1.11 09/11/2015    Lab Results  Component Value Date   WBC 4.7 10/16/2016   HGB 13.9 10/16/2016   HCT 41.2 10/16/2016   PLT 89.0 (L) 10/16/2016   GLUCOSE 120 (H) 10/16/2016   CHOL 110 10/16/2016   TRIG 144.0 10/16/2016   HDL 36.20 (L) 10/16/2016   LDLDIRECT 45.0 09/11/2015   LDLCALC 45 10/16/2016   ALT 41 10/16/2016   AST 23 10/16/2016   NA 145 10/16/2016   K 3.7 10/16/2016   CL 109 10/16/2016   CREATININE 1.02 10/16/2016   BUN 19 10/16/2016   CO2 32 10/16/2016   TSH 2.43 09/11/2015   INR 3.3 01/14/2017   HGBA1C 5.1 04/18/2014    Dg Chest 2 View  Result Date: 10/16/2016 CLINICAL DATA:  Hemoptysis. EXAM: CHEST  2 VIEW COMPARISON:  None. FINDINGS: Postsurgical changes from CABG. Cardiomediastinal silhouette is normal. Mediastinal contours appear intact. Calcific atherosclerotic disease and tortuosity of the aorta noted. There is no evidence of lobar airspace consolidation, pleural effusion or pneumothorax. Peribronchial thickening with central predominance bilaterally. Osseous structures are without acute abnormality. Findings compatible with diffuse idiopathic skeletal hyperostosis  of the thoracic spine. Soft tissues are grossly normal. IMPRESSION: Peribronchial thickening with central predominance usually associated with acute bronchitis or reactive airway disease. No evidence of lobar consolidation. Electronically Signed   By: Fidela Salisbury M.D.   On: 10/16/2016 12:14   US Venous Img Lower Unilateral Left  Result Date: 10/16/2016 CLINICAL DATA:  83 y/o  M; left calf pain and swelling for 3 days. EXAM: LEFT LOWER EXTREMITY VENOUS DOPPLER ULTRASOUND TECHNIQUE: Gray-scale sonography with graded compression, as well as color Doppler and duplex ultrasound were performed to evaluate the lower extremity deep venous systems from the level of the common femoral vein and including the common femoral, femoral, profunda femoral, popliteal and calf veins including the posterior tibial, peroneal and gastrocnemius veins when visible. The superficial great saphenous vein was also interrogated. Spectral Doppler was utilized to evaluate flow at rest and with distal augmentation maneuvers in the common femoral, femoral and popliteal veins. COMPARISON:  None. FINDINGS: Contralateral Common Femoral Vein: Respiratory phasicity is normal and symmetric with the symptomatic  side. No evidence of thrombus. Normal compressibility. Common Femoral Vein: No evidence of thrombus. Normal compressibility, respiratory phasicity and response to augmentation. Saphenofemoral Junction: No evidence of thrombus. Normal compressibility and flow on color Doppler imaging. Profunda Femoral Vein: No evidence of thrombus. Normal compressibility and flow on color Doppler imaging. Femoral Vein: No evidence of thrombus. Normal compressibility, respiratory phasicity and response to augmentation. Popliteal Vein: No evidence of thrombus. Normal compressibility, respiratory phasicity and response to augmentation. Calf Veins: No evidence of thrombus. Normal compressibility and flow on color Doppler imaging. Superficial Great Saphenous  Vein: No evidence of thrombus. Normal compressibility and flow on color Doppler imaging. Venous Reflux:  None. Other Findings:  None. IMPRESSION: No evidence of deep venous thrombosis. Electronically Signed   By: Kristine Garbe M.D.   On: 10/16/2016 16:48    Assessment & Plan:   Problem List Items Addressed This Visit    Obstructive sleep apnea    Diagnosed by sleep study. he is wearing her CPAP every night a minimum of 6 hours per night and notes improved daytime wakefulness and decreased fatigue       Impaired fasting glucose    A1c ordered today  For evaluation       Frequent nosebleeds    Scant amounts of bleeding noted dur to dry nasal passages aggravated by CPAP.  On coumadin.  Advised to humidify the CPAp       Chronic pain of left knee    He is managing his pain with three tramadol and one Vicodin daily . Refill history confirmed via [place] Controlled Substance databas, accessed by me today..      Relevant Medications   HYDROcodone-acetaminophen (NORCO/VICODIN) 5-325 MG tablet   HYDROcodone-acetaminophen (NORCO/VICODIN) 5-325 MG tablet      I have discontinued Mr. Micke sildenafil, Omeprazole Magnesium (PRILOSEC OTC PO), and guaiFENesin-codeine. I am also having him maintain his azelastine, traMADol, HYDROcodone-acetaminophen, ALPRAZolam, allopurinol, traZODone, metoprolol tartrate, warfarin, atorvastatin, furosemide, HYDROcodone-acetaminophen, and HYDROcodone-acetaminophen.  Meds ordered this encounter  Medications  . furosemide (LASIX) 20 MG tablet    Sig: furosemide 20 mg tablet  . HYDROcodone-acetaminophen (NORCO/VICODIN) 5-325 MG tablet    Sig: Take 1 tablet once daily as needed.    Dispense:  30 tablet    Refill:  0    Refill on or after  February 06 2017  . DISCONTD: HYDROcodone-acetaminophen (NORCO/VICODIN) 5-325 MG tablet    Sig: Take 1 tablet once daily as needed.    Dispense:  30 tablet    Refill:  0    Refill on or after  March 08 2017  .  HYDROcodone-acetaminophen (NORCO/VICODIN) 5-325 MG tablet    Sig: Take 1 tablet once daily as needed.    Dispense:  30 tablet    Refill:  0    Refill on or after  April 08 2017    Medications Discontinued During This Encounter  Medication Reason  . guaiFENesin-codeine (CHERATUSSIN AC) 100-10 MG/5ML syrup Patient has not taken in last 30 days  . metoprolol tartrate (LOPRESSOR) 50 MG tablet Duplicate  . Omeprazole Magnesium (PRILOSEC OTC PO) Patient has not taken in last 30 days  . sildenafil (VIAGRA) 100 MG tablet Patient has not taken in last 30 days  . HYDROcodone-acetaminophen (NORCO/VICODIN) 5-325 MG tablet Reorder  . HYDROcodone-acetaminophen (NORCO/VICODIN) 5-325 MG tablet Reorder  . HYDROcodone-acetaminophen (NORCO/VICODIN) 5-325 MG tablet Reorder    Follow-up: No Follow-up on file.   Crecencio Mc, MD

## 2017-01-26 NOTE — Assessment & Plan Note (Signed)
He is managing his pain with three tramadol and one Vicodin daily . Refill history confirmed via [place] Controlled Substance databas, accessed by me today.Marland Kitchen

## 2017-01-26 NOTE — Patient Instructions (Addendum)
For your constipation:  You can take miralax, metamucil, fibercon, or citrucel daily to supplement your fiber. These are gentle and work in 1 to 2 days to relieve constipation, and  you can also combine them daily with colace,  A stool softener .  Also,  make 3 16 ounce servings of wtaer your minimum goal for water intake   Don't forget to add water to your CPAP machine .  This will keep your sinuses from drying out.  I have refilled the hydrocodone for 3 more months

## 2017-01-28 ENCOUNTER — Encounter: Payer: Self-pay | Admitting: *Deleted

## 2017-02-02 ENCOUNTER — Telehealth: Payer: Self-pay | Admitting: Internal Medicine

## 2017-02-02 ENCOUNTER — Telehealth: Payer: Self-pay | Admitting: *Deleted

## 2017-02-02 MED ORDER — WARFARIN SODIUM 5 MG PO TABS: ORAL_TABLET | ORAL | 0 refills | Status: DC

## 2017-02-02 NOTE — Telephone Encounter (Signed)
Pt called with some questions regarding his lab resutls. Please advise?  Call pt @ [phone]. Thank you!

## 2017-02-02 NOTE — Telephone Encounter (Signed)
Patient has requested to have a week supply of warfarin  Pharmacy [place] on [facility]. Pt contact [phone]

## 2017-02-02 NOTE — Telephone Encounter (Signed)
Weeks worth of warfarin has been sent in. Pt is aware.

## 2017-02-03 NOTE — Telephone Encounter (Signed)
Called pt and he was wondering if he should be concerned because his glucose was elevated. Asked the pt if he was fasting when he came in to have his labs drawn, the pt stated no he was not. Explained to the pt that was why his glucose was elevated and if Dr. Derrel Nip thought it was something to be concerned about she would have let him know. The pt gave a verbal understanding.

## 2017-02-04 ENCOUNTER — Encounter (INDEPENDENT_AMBULATORY_CARE_PROVIDER_SITE_OTHER): Payer: Self-pay | Admitting: Vascular Surgery

## 2017-02-04 ENCOUNTER — Ambulatory Visit (INDEPENDENT_AMBULATORY_CARE_PROVIDER_SITE_OTHER): Payer: Medicare Other

## 2017-02-04 ENCOUNTER — Ambulatory Visit (INDEPENDENT_AMBULATORY_CARE_PROVIDER_SITE_OTHER): Payer: Medicare Other | Admitting: Vascular Surgery

## 2017-02-04 VITALS — BP 132/72 | HR 79 | Resp 16 | Wt 207.0 lb

## 2017-02-04 DIAGNOSIS — I878 Other specified disorders of veins: Secondary | ICD-10-CM | POA: Diagnosis not present

## 2017-02-04 DIAGNOSIS — I481 Persistent atrial fibrillation: Secondary | ICD-10-CM | POA: Diagnosis not present

## 2017-02-04 DIAGNOSIS — Z5181 Encounter for therapeutic drug level monitoring: Secondary | ICD-10-CM | POA: Diagnosis not present

## 2017-02-04 DIAGNOSIS — I4819 Other persistent atrial fibrillation: Secondary | ICD-10-CM

## 2017-02-04 DIAGNOSIS — I70219 Atherosclerosis of native arteries of extremities with intermittent claudication, unspecified extremity: Secondary | ICD-10-CM | POA: Diagnosis not present

## 2017-02-04 DIAGNOSIS — M79662 Pain in left lower leg: Secondary | ICD-10-CM

## 2017-02-04 LAB — POCT INR: INR: 3.2

## 2017-02-04 NOTE — Progress Notes (Signed)
Subjective:    Patient ID: Jeffery Reynolds, male    DOB: 1933/09/08, 83 y.o.   MRN: [id number] Chief Complaint  Patient presents with  . Follow-up   Patient presents to review vascular studies. He was last seen on 12/06/2016 for evaluation of left calf cramping. Patient states he has not experienced any cramping since his last visit. He remains active and goes to the gym 5 days a week. He is without complaint today,  denies any claudication, rest pain, or ulcerations of the lower extremity. The patient underwent a bilateral lower arterial ABI which was notable for noncompressible ankle pressures most likely due to medial calcification. Normal bilateral Doppler waveforms and toe brachial indices suggesting normal arterial perfusion to the bilateral lower extremities. There is no previous index for comparison as this was his first ABI.    Review of Systems  Constitutional: Negative.   HENT: Negative.   Eyes: Negative.   Respiratory: Negative.   Cardiovascular: Positive for leg swelling.  Gastrointestinal: Negative.   Endocrine: Negative.   Genitourinary: Negative.   Musculoskeletal: Negative.   Skin: Negative.   Allergic/Immunologic: Negative.   Neurological: Negative.   Hematological: Negative.   Psychiatric/Behavioral: Negative.       Objective:   Physical Exam  Constitutional: He is oriented to person, place, and time. He appears well-developed and well-nourished. No distress.  HENT:  Head: Normocephalic and atraumatic.  Eyes: Conjunctivae are normal. Pupils are equal, round, and reactive to light.  Neck: Normal range of motion.  Cardiovascular: Normal rate, regular rhythm and normal heart sounds.   Pulses:      Radial pulses are 2+ on the right side, and 2+ on the left side.       Dorsalis pedis pulses are 2+ on the right side, and 2+ on the left side.       Posterior tibial pulses are 2+ on the right side, and 2+ on the left side.  Pulmonary/Chest: Effort normal.    Musculoskeletal: Normal range of motion. He exhibits edema (Mild bilateral edema noted. Right lower extremity worsening left lower extremity.).  Neurological: He is alert and oriented to person, place, and time.  Skin: He is not diaphoretic.     Bilateral moderate stasis dermatitis noted. Skin is intact there are no ulcerations.  Psychiatric: He has a normal mood and affect. His behavior is normal. Judgment and thought content normal.  Vitals reviewed.  BP 132/72   Pulse 79   Resp 16   Wt 207 lb (93.9 kg)   BMI 31.47 kg/m   Past Medical History:  Diagnosis Date  . A-fib ([place]) 2000      . Arthritis   . CAD (coronary artery disease)   . Colon polyps   . Genital warts   . GERD (gastroesophageal reflux disease)   . Gout    Controlled with Allopurinol  . History of blood transfusion   . History of chicken pox   . Hyperlipidemia   . Hypertension   . Seasonal allergies   . Sleep apnea, obstructive   . 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 Marital status: Divorced    Spouse name: N/A  . Number of children: 7  . Years of education: 18   Occupational History  . [employer] - [employer]     Retired 1995   Social History Main Topics  . Smoking status: Never Smoker  . Smokeless tobacco: Never Used  . Alcohol  use Yes     Comment: occasional   . Drug use: No  . Sexual activity: Not Currently   Other Topics Concern  . Not on file   Social History Narrative   Mr. Palinkas grew up in [place], [place]. Most recently he was living in [place]. He moved to the [place] area since March 2015. Recently divorced and moved to this area to be close to one of his sons. He enjoys reading and has been going to the gym to exercise.            Past Surgical History:  Procedure Laterality Date  . CARDIAC CATHETERIZATION    . CORONARY ARTERY BYPASS GRAFT  1995   CABG x 6 in [place], [place]    . GYNECOMASTIA EXCISION    . KNEE  SURGERY     bilateral   . ROTATOR CUFF REPAIR     right  . TONSILLECTOMY AND ADENOIDECTOMY  1943   Family History  Problem Relation Age of Onset  . Heart disease Father   . Heart attack Father 53       MI  . Alcohol abuse Father   . Hyperlipidemia Father   . Hypertension Father   . Heart attack Paternal Grandfather 45  . Alcohol abuse Paternal Grandfather   . Cancer Mother        Breast cancer and Lung Cancer  . Diabetes Brother   . Dementia Brother   . Crohn's disease Daughter   . Cancer Brother        lung cancer  . Alcohol abuse Son   . Alcohol abuse Maternal Grandfather   . Alcohol abuse Son   . Heart attack Paternal Grandmother 61   No Known Allergies     Assessment & Plan:  Patient presents to review vascular studies. He was last seen on 12/06/2016 for evaluation of left calf cramping. Patient states he has not experienced any cramping since his last visit. He remains active and goes to the gym 5 days a week. He is without complaint today,  denies any claudication, rest pain, or ulcerations of the lower extremity. The patient underwent a bilateral lower arterial ABI which was notable for noncompressible ankle pressures most likely due to medial calcification. Normal bilateral Doppler waveforms and toe brachial indices suggesting normal arterial perfusion to the bilateral lower extremities. There is no previous index for comparison as this was his first ABI.   1. Pain of left calf - Improved Patient with history of left calf cramping. Hard to palpate pedal pulses on exam. Patient with normal bilateral ABIs today. Most likely musculoskeletal or DJD. Patient to follow up when necessary or in 2-3 years for repeat ABI  - VAS [place] ABI WITH/WO TBI  2. Venous stasis of both lower extremities - stable Patient with mild edema and moderate stasis dermatitis noted on exam. Patient is not interested in moving forward with a venous workup at this time. He will engage in  conservative therapy and call the office if he would like to pursue further workup. The patient was encouraged to wear graduated compression stockings (20-30 mmHg) on a daily basis. The patient was instructed to begin wearing the stockings first thing in the morning and removing them in the evening. The patient was instructed specifically not to sleep in the stockings. Prescription given. In addition, behavioral modification including elevation during the day will be initiated. Anti-inflammatories for pain.  Current Outpatient Prescriptions on File Prior to Visit  Medication Sig Dispense Refill  . allopurinol (ZYLOPRIM) 300 MG tablet Take on Monday, Wednesday and Friday. 90 tablet 2  . ALPRAZolam (XANAX) 0.25 MG tablet Take 1 tablet (0.25 mg total) by mouth at bedtime as needed. for sleep 30 tablet 0  . atorvastatin (LIPITOR) 40 MG tablet Take 1 tablet (40 mg total) by mouth daily. 90 tablet 1  . azelastine (OPTIVAR) 0.05 % ophthalmic solution Place 1 drop into both eyes 2 (two) times daily. 6 mL 5  . furosemide (LASIX) 20 MG tablet furosemide 20 mg tablet    . HYDROcodone-acetaminophen (NORCO/VICODIN) 5-325 MG tablet Take 1 tablet once daily as needed. 30 tablet 0  . HYDROcodone-acetaminophen (NORCO/VICODIN) 5-325 MG tablet Take 1 tablet once daily as needed. 30 tablet 0  . HYDROcodone-acetaminophen (NORCO/VICODIN) 5-325 MG tablet Take 1 tablet once daily as needed. 30 tablet 0  . metoprolol tartrate (LOPRESSOR) 50 MG tablet TAKE 1 TABLET(50 MG) BY MOUTH TWICE DAILY 180 tablet 3  . traMADol (ULTRAM) 50 MG tablet Take 1 tablet (50 mg total) by mouth every 6 (six) hours as needed. Maximum 3 daily 90 tablet 5  . traZODone (DESYREL) 150 MG tablet Take 1 tablet (150 mg total) by mouth at bedtime. 90 tablet 1  . warfarin (COUMADIN) 5 MG tablet TAKE 1 TABLET BY MOUTH DAILY OR AS DIRECTED BY COUMADIN CLINIC 10 tablet 0   No current facility-administered medications on file prior to visit.     There  are no Patient Instructions on file for this visit. No Follow-up on file.   Katilin Raynes A Tiyah Zelenak, PA-C

## 2017-03-04 ENCOUNTER — Ambulatory Visit (INDEPENDENT_AMBULATORY_CARE_PROVIDER_SITE_OTHER): Payer: Medicare Other | Admitting: *Deleted

## 2017-03-04 DIAGNOSIS — I70219 Atherosclerosis of native arteries of extremities with intermittent claudication, unspecified extremity: Secondary | ICD-10-CM | POA: Diagnosis not present

## 2017-03-04 DIAGNOSIS — I481 Persistent atrial fibrillation: Secondary | ICD-10-CM

## 2017-03-04 DIAGNOSIS — Z5181 Encounter for therapeutic drug level monitoring: Secondary | ICD-10-CM

## 2017-03-04 DIAGNOSIS — I4819 Other persistent atrial fibrillation: Secondary | ICD-10-CM

## 2017-03-04 LAB — POCT INR: INR: 2.4

## 2017-03-24 ENCOUNTER — Other Ambulatory Visit: Payer: Self-pay | Admitting: Internal Medicine

## 2017-03-31 ENCOUNTER — Telehealth: Payer: Self-pay | Admitting: *Deleted

## 2017-03-31 ENCOUNTER — Telehealth: Payer: Self-pay | Admitting: Internal Medicine

## 2017-03-31 NOTE — Telephone Encounter (Signed)
Spoke with patient and he is aware that he cannot receive his pain medication through the mail.

## 2017-03-31 NOTE — Telephone Encounter (Signed)
Pt has questions about his prescription on HYDROcodone-acetaminophen (NORCO/VICODIN) 5-325 MG tablet. He would like to have this be mailed order. Please call him at [phone].

## 2017-03-31 NOTE — Telephone Encounter (Signed)
Void, call dropped, caller not answering return call

## 2017-03-31 NOTE — Telephone Encounter (Signed)
Attempted to call patient. Left message to return call to office.

## 2017-04-01 ENCOUNTER — Ambulatory Visit (INDEPENDENT_AMBULATORY_CARE_PROVIDER_SITE_OTHER): Payer: Medicare Other | Admitting: *Deleted

## 2017-04-01 DIAGNOSIS — I4819 Other persistent atrial fibrillation: Secondary | ICD-10-CM

## 2017-04-01 DIAGNOSIS — I481 Persistent atrial fibrillation: Secondary | ICD-10-CM

## 2017-04-01 DIAGNOSIS — Z5181 Encounter for therapeutic drug level monitoring: Secondary | ICD-10-CM | POA: Diagnosis not present

## 2017-04-01 DIAGNOSIS — I70219 Atherosclerosis of native arteries of extremities with intermittent claudication, unspecified extremity: Secondary | ICD-10-CM

## 2017-04-01 LAB — POCT INR: INR: 2.6

## 2017-04-29 ENCOUNTER — Ambulatory Visit (INDEPENDENT_AMBULATORY_CARE_PROVIDER_SITE_OTHER): Payer: Medicare Other

## 2017-04-29 DIAGNOSIS — I4819 Other persistent atrial fibrillation: Secondary | ICD-10-CM

## 2017-04-29 DIAGNOSIS — Z5181 Encounter for therapeutic drug level monitoring: Secondary | ICD-10-CM

## 2017-04-29 DIAGNOSIS — I481 Persistent atrial fibrillation: Secondary | ICD-10-CM

## 2017-04-29 LAB — POCT INR: INR: 2.8

## 2017-06-08 ENCOUNTER — Other Ambulatory Visit: Payer: Self-pay | Admitting: Internal Medicine

## 2017-06-08 ENCOUNTER — Other Ambulatory Visit: Payer: Self-pay | Admitting: Cardiovascular Disease

## 2017-06-09 NOTE — Telephone Encounter (Signed)
Refill request

## 2017-06-10 ENCOUNTER — Ambulatory Visit (INDEPENDENT_AMBULATORY_CARE_PROVIDER_SITE_OTHER): Payer: Medicare Other

## 2017-06-10 DIAGNOSIS — Z5181 Encounter for therapeutic drug level monitoring: Secondary | ICD-10-CM

## 2017-06-10 DIAGNOSIS — I4819 Other persistent atrial fibrillation: Secondary | ICD-10-CM

## 2017-06-10 DIAGNOSIS — I481 Persistent atrial fibrillation: Secondary | ICD-10-CM | POA: Diagnosis not present

## 2017-06-10 LAB — POCT INR: INR: 2.2

## 2017-06-15 ENCOUNTER — Telehealth: Payer: Self-pay | Admitting: Internal Medicine

## 2017-06-15 ENCOUNTER — Other Ambulatory Visit: Payer: Self-pay | Admitting: Internal Medicine

## 2017-06-15 MED ORDER — HYDROCODONE-ACETAMINOPHEN 5-325 MG PO TABS: ORAL_TABLET | ORAL | 0 refills | Status: DC

## 2017-06-15 NOTE — Telephone Encounter (Signed)
Refilled: 01/26/2017 Last OV: 01/26/2017 Next OV: not scheduled.

## 2017-06-15 NOTE — Telephone Encounter (Signed)
Please notify patient that the prescription  was Refilled for 30 days only because it has been 4 months since last visit. Marland Kitchen  OFFICE VISIT NEEDED prior to any more refills

## 2017-06-15 NOTE — Telephone Encounter (Signed)
Pt needs a refill on VICODIN) 5-325 MG. Please advise

## 2017-06-16 NOTE — Telephone Encounter (Signed)
Pt made appt for 07/10/17 and was given his rx out of blue folder

## 2017-06-26 DIAGNOSIS — H524 Presbyopia: Secondary | ICD-10-CM | POA: Diagnosis not present

## 2017-06-26 DIAGNOSIS — E7800 Pure hypercholesterolemia, unspecified: Secondary | ICD-10-CM | POA: Diagnosis not present

## 2017-06-26 DIAGNOSIS — H5203 Hypermetropia, bilateral: Secondary | ICD-10-CM | POA: Diagnosis not present

## 2017-06-26 DIAGNOSIS — H2513 Age-related nuclear cataract, bilateral: Secondary | ICD-10-CM | POA: Diagnosis not present

## 2017-06-26 DIAGNOSIS — H1089 Other conjunctivitis: Secondary | ICD-10-CM | POA: Diagnosis not present

## 2017-06-26 DIAGNOSIS — H52223 Regular astigmatism, bilateral: Secondary | ICD-10-CM | POA: Diagnosis not present

## 2017-07-07 ENCOUNTER — Telehealth: Payer: Self-pay

## 2017-07-07 NOTE — Telephone Encounter (Signed)
Do not see where anyone from our office has called her. Could be where he has not received a flu shot   Copied from [place] [id number]. Topic: Inquiry >> Jul 07, 2017 10:49 AM Corie Chiquito, [place] wrote: Reason for CRM: Patient had a missed call from the office and would like if someone could give him a call back please.

## 2017-07-08 NOTE — Telephone Encounter (Signed)
Spoke with pt to let him know that I didn't see where anyone had tried to call him but that it could have been about trying to schedule him an appt to get his flu shot. The pt stated that he has an appt with Dr. Derrel Nip on Friday and he is going to get his flu shot then.

## 2017-07-10 ENCOUNTER — Encounter: Payer: Self-pay | Admitting: Internal Medicine

## 2017-07-10 ENCOUNTER — Ambulatory Visit (INDEPENDENT_AMBULATORY_CARE_PROVIDER_SITE_OTHER): Payer: Medicare Other | Admitting: Internal Medicine

## 2017-07-10 VITALS — BP 100/62 | HR 72 | Temp 97.7°F | Resp 15 | Ht 68.0 in | Wt 211.8 lb

## 2017-07-10 DIAGNOSIS — I878 Other specified disorders of veins: Secondary | ICD-10-CM

## 2017-07-10 DIAGNOSIS — M25562 Pain in left knee: Secondary | ICD-10-CM | POA: Diagnosis not present

## 2017-07-10 DIAGNOSIS — Z79899 Other long term (current) drug therapy: Secondary | ICD-10-CM

## 2017-07-10 DIAGNOSIS — I25118 Atherosclerotic heart disease of native coronary artery with other forms of angina pectoris: Secondary | ICD-10-CM

## 2017-07-10 DIAGNOSIS — D696 Thrombocytopenia, unspecified: Secondary | ICD-10-CM | POA: Diagnosis not present

## 2017-07-10 DIAGNOSIS — I70219 Atherosclerosis of native arteries of extremities with intermittent claudication, unspecified extremity: Secondary | ICD-10-CM | POA: Diagnosis not present

## 2017-07-10 DIAGNOSIS — G8929 Other chronic pain: Secondary | ICD-10-CM | POA: Diagnosis not present

## 2017-07-10 DIAGNOSIS — F819 Developmental disorder of scholastic skills, unspecified: Secondary | ICD-10-CM | POA: Diagnosis not present

## 2017-07-10 DIAGNOSIS — Z23 Encounter for immunization: Secondary | ICD-10-CM | POA: Diagnosis not present

## 2017-07-10 MED ORDER — HYDROCODONE-ACETAMINOPHEN 5-325 MG PO TABS: ORAL_TABLET | ORAL | 0 refills | Status: DC

## 2017-07-10 NOTE — Patient Instructions (Signed)
You are doing well!     Please return in one for month for A LAB VISIT ONLY .  PLEASE FAST BEFORE YOUR VISIT  Return to see me In late February to refill your Vicodin

## 2017-07-10 NOTE — Progress Notes (Addendum)
Subjective:  Patient ID: Jeffery Reynolds, male    DOB: July 30, 1934  Age: 83 y.o. MRN: [id number]  CC: The primary encounter diagnosis was Coronary artery disease of native artery of native heart with stable angina pectoris ([place]). 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  Denies any recent episodes of dyspnea, presyncope or chest pain.    Knee pain: severe DJD:  Uses 3 tramadol and one vicodin  Daily .  Ran out due to lack of follow up .  Has found a stationery bike he can ride at [employer]  for 30 minutes daily that does not bother his knee.  Obesity:  His weight is stable.  He states that he is exercising daily at [employer]  for 2 hours.   IPG:   A1c was normal in June. He follows a low glycemic index diet.    Lipids at goal in Feb and lfts normal in June  Lab Results  Component Value Date   HGBA1C 5.4 01/26/2017     Outpatient Medications Prior to Visit  Medication Sig Dispense Refill  . allopurinol (ZYLOPRIM) 300 MG tablet Take on Monday, Wednesday and Friday. 90 tablet 2  . ALPRAZolam (XANAX) 0.25 MG tablet TAKE 1 TABLET AT BEDTIME 30 tablet 3  . Artificial Tear Ointment (DRY EYES OP) Apply 1 drop 3 (three) times daily to eye.    Marland Kitchen atorvastatin (LIPITOR) 40 MG tablet TAKE 1 TABLET (40 MG TOTAL) BY MOUTH DAILY. 90 tablet 1  . furosemide (LASIX) 20 MG tablet furosemide 20 mg tablet    . metoprolol tartrate (LOPRESSOR) 50 MG tablet TAKE  1 TABLET(50 MG) BY MOUTH TWICE DAILY 180 tablet 3  . traMADol (ULTRAM) 50 MG tablet Take 1 tablet (50 mg total) by mouth every 6 (six) hours as needed. Maximum 3 daily 90 tablet 5  . traZODone (DESYREL) 150 MG tablet TAKE 1 TABLET (150 MG TOTAL) BY MOUTH AT BEDTIME. 90 tablet 1  . trimethoprim-polymyxin b (POLYTRIM) ophthalmic solution INT 1 GTT IN OU Q 3 H WA  0  . warfarin (COUMADIN) 5 MG tablet TAKE 1 TABLET DAILY OR AS DIRECTED BY COUMADIN CLINIC 90 tablet 0  . HYDROcodone-acetaminophen (NORCO/VICODIN) 5-325 MG tablet Take 1 tablet once daily as needed. 30 tablet 0  . azelastine (OPTIVAR) 0.05 % ophthalmic solution Place 1 drop into both eyes 2 (two) times daily. (Patient not taking: Reported on 07/10/2017) 6 mL 5   No facility-administered medications prior to visit.     Review of Systems;  Patient denies headache, fevers, malaise, unintentional weight loss, skin rash, eye pain, sinus congestion and sinus pain, sore throat, dysphagia,  hemoptysis , cough, dyspnea, wheezing, chest pain, palpitations, orthopnea, edema, abdominal pain, nausea, melena, diarrhea, constipation, flank pain, dysuria, hematuria, urinary  Frequency, nocturia, numbness, tingling, seizures,  Focal weakness, Loss of consciousness,  Tremor, insomnia, depression, anxiety, and suicidal ideation.      Objective:  BP 100/62 (BP Location: Left Arm, Patient Position: Sitting, Cuff Size:  Large)   Pulse 72   Temp 97.7 F (36.5 C) (Oral)   Resp 15   Ht 5\' 8"  (1.727 m)   Wt 211 lb 12.8 oz (96.1 kg)   SpO2 96%   BMI 32.20 kg/m   BP Readings from Last 3 Encounters:  07/10/17 100/62  02/04/17 132/72  01/26/17 110/78    Wt Readings from Last 3 Encounters:  07/10/17 211 lb 12.8 oz (96.1 kg)  02/04/17 207 lb (93.9 kg)  01/26/17 204 lb 12.8 oz (92.9 kg)    General appearance: alert, cooperative and appears stated age Ears: normal TM's and external ear canals both ears Throat: lips, mucosa, and tongue normal; teeth  and gums normal Neck: no adenopathy, no carotid bruit, supple, symmetrical, trachea midline and thyroid not enlarged, symmetric, no tenderness/mass/nodules Back: symmetric, no curvature. ROM normal. No CVA tenderness. Lungs: clear to auscultation bilaterally Heart: regular rate and rhythm, S1, S2 normal, no murmur, click, rub or gallop Abdomen: soft, non-tender; bowel sounds normal; no masses,  no organomegaly Pulses: 2+ and symmetric Skin: Skin color, texture, turgor normal. No rashes or lesions Lymph nodes: Cervical, supraclavicular, and axillary nodes normal.  Lab Results  Component Value Date   HGBA1C 5.4 01/26/2017   HGBA1C 5.1 04/18/2014    Lab Results  Component Value Date   CREATININE 1.09 01/26/2017   CREATININE 1.02 10/16/2016   CREATININE 0.99 01/30/2016    Lab Results  Component Value Date   WBC 4.7 10/16/2016   HGB 13.9 10/16/2016   HCT 41.2 10/16/2016   PLT 89.0 (L) 10/16/2016   GLUCOSE 143 (H) 01/26/2017   CHOL 110 10/16/2016   TRIG 144.0 10/16/2016   HDL 36.20 (L) 10/16/2016   LDLDIRECT 45.0 09/11/2015   LDLCALC 45 10/16/2016   ALT 39 01/26/2017   AST 26 01/26/2017   NA 141 01/26/2017   K 3.8 01/26/2017   CL 105 01/26/2017   CREATININE 1.09 01/26/2017   BUN 26 (H) 01/26/2017   CO2 31 01/26/2017   TSH 2.43 09/11/2015   INR 2.2 06/10/2017   HGBA1C 5.4 01/26/2017    Dg Chest 2 View  Result Date: 10/16/2016 CLINICAL DATA:  Hemoptysis. EXAM: CHEST  2 VIEW COMPARISON:  None. FINDINGS: Postsurgical changes from CABG. Cardiomediastinal silhouette is normal. Mediastinal contours appear intact. Calcific atherosclerotic disease and tortuosity of the aorta noted. There is no evidence of lobar airspace consolidation, pleural effusion or pneumothorax. Peribronchial thickening with central predominance bilaterally. Osseous structures are without acute abnormality. 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 Soft tissues  are grossly normal. IMPRESSION: Peribronchial thickening with central predominance usually associated with acute bronchitis or reactive airway disease. No evidence of lobar consolidation. Electronically Signed   By: Fidela Salisbury M.D.   On: 10/16/2016 12:14   US Venous Img Lower Unilateral Left  Result Date: 10/16/2016 CLINICAL DATA:  83 y/o  M; left calf pain and swelling for 3 days. EXAM: LEFT LOWER EXTREMITY VENOUS DOPPLER ULTRASOUND TECHNIQUE: Gray-scale sonography with graded compression, as well as color Doppler and duplex ultrasound were performed to evaluate the lower extremity deep venous systems from the level of the common femoral vein and including the common femoral, femoral, profunda femoral, popliteal and calf veins including the posterior tibial, peroneal and gastrocnemius veins when visible. The superficial great saphenous vein was also interrogated. Spectral Doppler was utilized to evaluate flow at rest and with distal augmentation maneuvers in the common femoral, femoral and popliteal veins. COMPARISON:  None. FINDINGS:  Contralateral Common Femoral Vein: Respiratory phasicity is normal and symmetric with the symptomatic side. No evidence of thrombus. Normal compressibility. Common Femoral Vein: No evidence of thrombus. Normal compressibility, respiratory phasicity and response to augmentation. Saphenofemoral Junction: No evidence of thrombus. Normal compressibility and flow on color Doppler imaging. Profunda Femoral Vein: No evidence of thrombus. Normal compressibility and flow on color Doppler imaging. Femoral Vein: No evidence of thrombus. Normal compressibility, respiratory phasicity and response to augmentation. Popliteal Vein: No evidence of thrombus. Normal compressibility, respiratory phasicity and response to augmentation. Calf Veins: No evidence of thrombus. Normal compressibility and flow on color Doppler imaging. Superficial Great Saphenous Vein: No evidence of thrombus.  Normal compressibility and flow on color Doppler imaging. Venous Reflux:  None. Other Findings:  None. IMPRESSION: No evidence of deep venous thrombosis. Electronically Signed   By: Kristine Garbe M.D.   On: 10/16/2016 16:48    Assessment & Plan:   Problem List Items Addressed This Visit    Chronic pain of left knee    He is managing his pain with three tramadol and one Vicodin daily . 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 He remains independently functional . He appears to be taking his medications correctly and has never missed an appointment       Thrombocytopenia ([place])   Relevant Orders   CBC with Differential/Platelet   Venous stasis of both lower extremities    Managed with compression garments and every other day use of furosemide.  He has no histoyry of venous ulcers .  Cr is stable  Lab Results  Component Value Date   CREATININE 1.09 01/26/2017   Lab Results  Component Value Date   NA 141 01/26/2017   K 3.8 01/26/2017   CL 105 01/26/2017   CO2 31 01/26/2017          Other Visit Diagnoses    Long-term use of high-risk medication       Relevant Orders   Comprehensive metabolic panel   Encounter for immunization       Relevant Orders   Flu vaccine HIGH DOSE PF (Completed)      I have discontinued Duwan T. Lull's azelastine. I am also having him maintain his traMADol, allopurinol, metoprolol tartrate, furosemide, ALPRAZolam, warfarin, traZODone, atorvastatin,  trimethoprim-polymyxin b, Artificial Tear Ointment (DRY EYES OP), HYDROcodone-acetaminophen, HYDROcodone-acetaminophen, and HYDROcodone-acetaminophen.  Meds ordered this encounter  Medications  . trimethoprim-polymyxin b (POLYTRIM) ophthalmic solution    Sig: INT 1 GTT IN OU Q 3 H WA    Refill:  0  . Artificial Tear Ointment (DRY EYES OP)    Sig: Apply 1 drop 3 (three) times daily to eye.  Marland Kitchen HYDROcodone-acetaminophen (NORCO/VICODIN) 5-325 MG tablet    Sig: Take 1 tablet once daily as needed.    Dispense:  30 tablet    Refill:  0    MAY REFILL ON OR AFTER Jul 16 2017  . HYDROcodone-acetaminophen (NORCO/VICODIN) 5-325 MG tablet    Sig: Take 1 tablet once daily as needed.    Dispense:  30 tablet  Refill:  0    MAY REFILL ON OR AFTER Aug 15 2017  . HYDROcodone-acetaminophen (NORCO/VICODIN) 5-325 MG tablet    Sig: Take 1 tablet once daily as needed.    Dispense:  31 tablet    Refill:  0    MAY REFILL ON OR AFTER September 15 2016    Medications Discontinued During This Encounter  Medication Reason  . azelastine (OPTIVAR) 0.05 % ophthalmic solution Patient has not taken in last 30 days  . HYDROcodone-acetaminophen (NORCO/VICODIN) 5-325 MG tablet Reorder    Follow-up: Return in about 3 months (around 10/10/2017) for medication refill .   Crecencio Mc, MD

## 2017-07-12 NOTE — Assessment & Plan Note (Signed)
He is managing his pain with three tramadol and one Vicodin daily . Refill history confirmed via [place] Controlled Substance databas, accessed by me today.Marland Kitchen Refills  For November, December and January given on Vicodin

## 2017-07-12 NOTE — Assessment & Plan Note (Signed)
Complicated by Cerebrovascular disease. He remains independently functional . He appears to be taking his medications correctly and has never missed an appointment

## 2017-07-12 NOTE — Assessment & Plan Note (Addendum)
Managed with compression garments and every other day use of furosemide.  He has no histoyry of venous ulcers .  Cr is stable  Lab Results  Component Value Date   CREATININE 1.09 01/26/2017   Lab Results  Component Value Date   NA 141 01/26/2017   K 3.8 01/26/2017   CL 105 01/26/2017   CO2 31 01/26/2017

## 2017-07-22 ENCOUNTER — Ambulatory Visit (INDEPENDENT_AMBULATORY_CARE_PROVIDER_SITE_OTHER): Payer: Medicare Other

## 2017-07-22 DIAGNOSIS — I481 Persistent atrial fibrillation: Secondary | ICD-10-CM | POA: Diagnosis not present

## 2017-07-22 DIAGNOSIS — Z5181 Encounter for therapeutic drug level monitoring: Secondary | ICD-10-CM

## 2017-07-22 DIAGNOSIS — I4819 Other persistent atrial fibrillation: Secondary | ICD-10-CM

## 2017-07-22 LAB — POCT INR: INR: 1.6

## 2017-07-22 NOTE — Patient Instructions (Signed)
Take 2 tablets tonight, then resume taking 1 tablet daily.   Recheck in 3 weeks.

## 2017-08-12 ENCOUNTER — Other Ambulatory Visit (INDEPENDENT_AMBULATORY_CARE_PROVIDER_SITE_OTHER): Payer: Medicare Other

## 2017-08-12 ENCOUNTER — Ambulatory Visit (INDEPENDENT_AMBULATORY_CARE_PROVIDER_SITE_OTHER): Payer: Medicare Other

## 2017-08-12 DIAGNOSIS — Z79899 Other long term (current) drug therapy: Secondary | ICD-10-CM | POA: Diagnosis not present

## 2017-08-12 DIAGNOSIS — I25118 Atherosclerotic heart disease of native coronary artery with other forms of angina pectoris: Secondary | ICD-10-CM | POA: Diagnosis not present

## 2017-08-12 DIAGNOSIS — D696 Thrombocytopenia, unspecified: Secondary | ICD-10-CM

## 2017-08-12 DIAGNOSIS — I481 Persistent atrial fibrillation: Secondary | ICD-10-CM

## 2017-08-12 DIAGNOSIS — I4819 Other persistent atrial fibrillation: Secondary | ICD-10-CM

## 2017-08-12 DIAGNOSIS — Z5181 Encounter for therapeutic drug level monitoring: Secondary | ICD-10-CM

## 2017-08-12 LAB — LIPID PANEL
Cholesterol: 103 mg/dL (ref 0–200)
HDL: 40.3 mg/dL (ref >39.00)
LDL Cholesterol: 32 mg/dL (ref 0–99)
NonHDL: 63.02
Total CHOL/HDL Ratio: 3
Triglycerides: 155 mg/dL — ABNORMAL HIGH (ref 0.0–149.0)
VLDL: 31 mg/dL (ref 0.0–40.0)

## 2017-08-12 LAB — CBC WITH DIFFERENTIAL/PLATELET
Basophils Absolute: 0 10*3/uL (ref 0.0–0.1)
Basophils Relative: 0.6 % (ref 0.0–3.0)
Eosinophils Absolute: 0.1 10*3/uL (ref 0.0–0.7)
Eosinophils Relative: 2.8 % (ref 0.0–5.0)
HCT: 43.5 % (ref 39.0–52.0)
Hemoglobin: 14.4 g/dL (ref 13.0–17.0)
Lymphocytes Relative: 20.3 % (ref 12.0–46.0)
Lymphs Abs: 1 10*3/uL (ref 0.7–4.0)
MCHC: 33.1 g/dL (ref 30.0–36.0)
MCV: 100.2 fl — ABNORMAL HIGH (ref 78.0–100.0)
Monocytes Absolute: 0.7 10*3/uL (ref 0.1–1.0)
Monocytes Relative: 14.5 % — ABNORMAL HIGH (ref 3.0–12.0)
Neutro Abs: 3 10*3/uL (ref 1.4–7.7)
Neutrophils Relative %: 61.8 % (ref 43.0–77.0)
Platelets: 97 10*3/uL — ABNORMAL LOW (ref 150.0–400.0)
RBC: 4.34 Mil/uL (ref 4.22–5.81)
RDW: 14.3 % (ref 11.5–15.5)
WBC: 4.8 10*3/uL (ref 4.0–10.5)

## 2017-08-12 LAB — POCT INR: INR: 3.7

## 2017-08-12 LAB — COMPREHENSIVE METABOLIC PANEL
ALT: 32 U/L (ref 0–53)
AST: 24 U/L (ref 0–37)
Albumin: 4 g/dL (ref 3.5–5.2)
Alkaline Phosphatase: 88 U/L (ref 39–117)
BUN: 24 mg/dL — ABNORMAL HIGH (ref 6–23)
CO2: 30 mEq/L (ref 19–32)
Calcium: 8.9 mg/dL (ref 8.4–10.5)
Chloride: 106 mEq/L (ref 96–112)
Creatinine, Ser: 1.24 mg/dL (ref 0.40–1.50)
GFR: 59.33 mL/min — ABNORMAL LOW (ref >60.00)
Glucose, Bld: 101 mg/dL — ABNORMAL HIGH (ref 70–99)
Potassium: 4.4 mEq/L (ref 3.5–5.1)
Sodium: 140 mEq/L (ref 135–145)
Total Bilirubin: 1 mg/dL (ref 0.2–1.2)
Total Protein: 6.7 g/dL (ref 6.0–8.3)

## 2017-08-12 NOTE — Patient Instructions (Signed)
Please skip coumadin tonight, then resume taking 1 tablet daily.   Recheck in 3 weeks.

## 2017-08-20 ENCOUNTER — Other Ambulatory Visit: Payer: Self-pay | Admitting: Internal Medicine

## 2017-08-20 NOTE — Telephone Encounter (Signed)
Copied from [place] [id number]. Topic: Quick Communication - See Telephone Encounter >> Aug 20, 2017  2:57 PM Aurelio Brash B wrote: CRM for notification. See Telephone encounter for:  Refill alprazplam    Leaving for out of town  in the morning [place], [place] AT [place]   08/20/17.

## 2017-08-20 NOTE — Telephone Encounter (Signed)
Patient is leaving out of town, so he said to tell the doctor not to send the [place] prescription in until she hears from him.  When he get where he is going, he is going to call into the office and tell them where to send the prescription so he can get it there.

## 2017-08-20 NOTE — Telephone Encounter (Signed)
Refill request for Xanax, last seen 07-12-17, last filled 03-25-17.  Please advise.

## 2017-08-20 NOTE — Telephone Encounter (Signed)
Forwarding to provider.

## 2017-08-21 MED ORDER — ALPRAZOLAM 0.25 MG PO TABS: 0.2500 mg | ORAL_TABLET | Freq: Every day | ORAL | 0 refills | Status: DC

## 2017-08-21 NOTE — Telephone Encounter (Signed)
Print but do not send untl patient notifies [place] as to WHERE

## 2017-08-21 NOTE — Telephone Encounter (Signed)
Refilled: 03/25/2017 Last OV: 07/10/2017 Next OV: 10/14/2017

## 2017-09-15 ENCOUNTER — Other Ambulatory Visit: Payer: Self-pay

## 2017-09-15 MED ORDER — HYDROCODONE-ACETAMINOPHEN 5-325 MG PO TABS: ORAL_TABLET | ORAL | 0 refills | Status: DC

## 2017-09-15 NOTE — Telephone Encounter (Signed)
Per Dr. Lupita Dawn request the rx was reprinted to be signed. Pt is aware that rx is ready and stated that he would be by tomorrow to pick rx up.

## 2017-09-23 ENCOUNTER — Ambulatory Visit (INDEPENDENT_AMBULATORY_CARE_PROVIDER_SITE_OTHER): Payer: Medicare Other

## 2017-09-23 VITALS — BP 110/62 | HR 61 | Temp 98.3°F | Resp 14 | Ht 68.0 in | Wt 206.4 lb

## 2017-09-23 DIAGNOSIS — Z0000 Encounter for general adult medical examination without abnormal findings: Secondary | ICD-10-CM | POA: Diagnosis not present

## 2017-09-23 DIAGNOSIS — Z5181 Encounter for therapeutic drug level monitoring: Secondary | ICD-10-CM

## 2017-09-23 DIAGNOSIS — I481 Persistent atrial fibrillation: Secondary | ICD-10-CM

## 2017-09-23 DIAGNOSIS — I4819 Other persistent atrial fibrillation: Secondary | ICD-10-CM

## 2017-09-23 DIAGNOSIS — Z1331 Encounter for screening for depression: Secondary | ICD-10-CM

## 2017-09-23 LAB — POCT INR: INR: 2.5

## 2017-09-23 NOTE — Patient Instructions (Signed)
Please resume taking 1 tablet daily.   Recheck in 4 weeks.

## 2017-09-23 NOTE — Progress Notes (Signed)
Subjective:   Jeffery Reynolds is a 84 y.o. male who presents for Medicare Annual/Subsequent preventive examination.  Review of Systems:  No ROS.  Medicare Wellness Visit. Additional risk factors are reflected in the social history. Cardiac Risk Factors include: advanced age (>62men, >51 women);hypertension;obesity (BMI >30kg/m2)     Objective:    Vitals: BP 110/62 (BP Location: Left Arm, Patient Position: Sitting, Cuff Size: Normal)   Pulse 61   Temp 98.3 F (36.8 C) (Oral)   Resp 14   Ht 5\' 8"  (1.727 m)   Wt 206 lb 6.4 oz (93.6 kg)   SpO2 95%   BMI 31.38 kg/m   Body mass index is 31.38 kg/m.  Advanced Directives 09/23/2017 02/04/2017 12/09/2016 09/22/2016 09/11/2015 03/22/2015  Does Patient Have a Medical Advance Directive? Yes - Yes Yes Yes No  Type of Paramedic of [place];Living will Living will [place];Living will [place];Living will -  Does patient want to make changes to medical advance directive? No - Patient declined - - No - Patient declined No - Patient declined -  Copy of [place] in Chart? No - copy requested - - No - copy requested No - copy requested -  Would patient like information on creating a medical advance directive? - - - - - Yes - Scientist, clinical (histocompatibility and immunogenetics) given    Tobacco Social History   Tobacco Use  Smoking Status Never Smoker  Smokeless Tobacco Never Used     Counseling given: Not Answered   Clinical Intake:  Pre-visit preparation completed: Yes  Pain : No/denies pain     Nutritional Status: BMI > 30  Obese Diabetes: No  How often do you need to have someone help you when you read instructions, pamphlets, or other written materials from your doctor or pharmacy?: 1 - Never  Interpreter Needed?: No     Past Medical History:  Diagnosis Date  . A-fib ([place]) 2000      . Arthritis   . CAD (coronary artery disease)   . Colon  polyps   . Genital warts   . GERD (gastroesophageal reflux disease)   . Gout    Controlled with Allopurinol  . History of blood transfusion   . History of chicken pox   . Hyperlipidemia   . Hypertension   . Seasonal allergies   . Sleep apnea, obstructive   . 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 CARDIAC CATHETERIZATION    . CORONARY ARTERY BYPASS GRAFT  1995   CABG x 6 in [place]    . GYNECOMASTIA EXCISION    . KNEE SURGERY     bilateral   . ROTATOR CUFF REPAIR     right  . TONSILLECTOMY AND ADENOIDECTOMY  1943   Family History  Problem Relation Age of Onset  . Heart disease Father   . Heart attack Father 23       MI  . Alcohol abuse Father   . Hyperlipidemia Father   . Hypertension Father   . Heart attack Paternal Grandfather 65  . Alcohol abuse Paternal Grandfather   . Cancer Mother        Breast cancer and Lung Cancer  . Diabetes Brother   . Dementia Brother   . Crohn's disease Daughter   . Cancer Brother        lung cancer  . Alcohol  abuse Son   . Alcohol abuse Maternal Grandfather   . Alcohol abuse Son   . Heart attack Paternal Grandmother 26   Social History   Socioeconomic History  . Marital status: Divorced    Spouse name: None  . Number of children: 7  . Years of education: 46  . Highest education level: None  Social Needs  . Financial resource strain: Not hard at all  . Food insecurity - worry: Never true  . Food insecurity - inability: Never true  . Transportation needs - medical: No  . Transportation needs - non-medical: No  Occupational History  . 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 Smoking status: Never Smoker  . Smokeless tobacco: Never Used  Substance and Sexual Activity  . Alcohol use: Yes    Comment: occasional   . Drug use: No  . Sexual activity: Not Currently  Other Topics Concern  . None  Social History  Narrative   Mr. Redmon grew up in [place], [place]. Most recently he was living in [place]. He moved to the [place] area since March 2015. Recently divorced and moved to this area to be close to one of his sons. He enjoys reading and has been going to the gym to exercise.             Outpatient Encounter Medications as of 09/23/2017  Medication Sig  . atorvastatin (LIPITOR) 40 MG tablet TAKE 1 TABLET (40 MG TOTAL) BY MOUTH DAILY.  . metoprolol tartrate (LOPRESSOR) 50 MG tablet TAKE 1 TABLET(50 MG) BY MOUTH TWICE DAILY  . warfarin (COUMADIN) 5 MG tablet TAKE 1 TABLET DAILY OR AS DIRECTED BY COUMADIN CLINIC  . allopurinol (ZYLOPRIM) 300 MG tablet Take on Monday, Wednesday and Friday. (Patient not taking: Reported on 09/23/2017)  . ALPRAZolam (XANAX) 0.25 MG tablet Take 1 tablet (0.25 mg total) by mouth at bedtime. (Patient not taking: Reported on 09/23/2017)  . Artificial Tear Ointment (DRY EYES OP) Apply 1 drop 3 (three) times daily to eye.  . furosemide (LASIX) 20 MG tablet furosemide 20 mg tablet  . HYDROcodone-acetaminophen (NORCO/VICODIN) 5-325 MG tablet Take 1 tablet once daily as needed. (Patient not taking: Reported on 09/23/2017)  . HYDROcodone-acetaminophen (NORCO/VICODIN) 5-325 MG tablet Take 1 tablet once daily as needed. (Patient not taking: Reported on 09/23/2017)  . HYDROcodone-acetaminophen (NORCO/VICODIN) 5-325 MG tablet Take 1 tablet once daily as needed. (Patient not taking: Reported on 09/23/2017)  . traMADol (ULTRAM) 50 MG tablet Take 1 tablet (50 mg total) by mouth every 6 (six) hours as needed. Maximum 3 daily (Patient not taking: Reported on 09/23/2017)  . traZODone (DESYREL) 150 MG tablet TAKE 1 TABLET (150 MG TOTAL) BY MOUTH AT BEDTIME. (Patient not taking: Reported on 09/23/2017)  . trimethoprim-polymyxin b (POLYTRIM) ophthalmic solution INT 1 GTT IN OU Q 3 H WA   No facility-administered encounter medications on file as of 09/23/2017.     Activities of Daily Living In your  present state of health, do you have any difficulty performing the following activities: 09/23/2017  Hearing? N  Vision? N  Difficulty concentrating or making decisions? Y  Comment Difficulty remembering names  Walking or climbing stairs? N  Dressing or bathing? N  Doing errands, shopping? N  Preparing Food and eating ? N  Using the Toilet? N  In the past six months, have you accidently leaked urine? N  Do you have problems with loss of bowel control? N  Managing your Medications? N  Managing your Finances? N  Housekeeping or managing your Housekeeping? N  Some recent data might be hidden    Patient Care Team: Crecencio Mc, MD as PCP - General (Internal Medicine)   Assessment:   This is a routine wellness examination for [place]. The goal of the wellness visit is to assist the patient how to close the gaps in care and create a preventative care plan for the patient.   The roster of all physicians providing medical care to patient is listed in the Snapshot section of the chart.  Osteoporosis risk reviewed.    Safety issues reviewed; Smoke and carbon monoxide detectors in the home. No firearms in the home.  Wears seatbelts when driving or riding with others. Patient does wear sunscreen or protective clothing when in direct sunlight. No violence in the home.  Depression- PHQ 2 &9 complete.  No signs/symptoms or verbal communication regarding little pleasure in doing things, feeling down, depressed or hopeless. No changes in sleeping, energy, eating, concentrating.  No thoughts of self harm or harm towards others.  Time spent on this topic is 8 minutes.   Patient is alert, normal appearance, oriented to person/place/and time. Correctly identified the president of the [place], recall of 2/3 words, and performing simple calculations. Displays appropriate judgement and can read correct time from watch face.   No new identified risk were noted.  No failures at ADL's or IADL's.    BMI-  discussed the importance of a healthy diet, water intake and the benefits of aerobic exercise. Educational material provided.   24 hour diet recall: Low fat diet  Daily fluid intake: 1 cups of caffeine, 6-8 cups of water  Dental- every 6 months.  Dr. Albesa Seen.  Eye- Visual acuity not assessed per patient preference since they have regular follow up with the ophthalmologist.  Wears corrective lenses.  Sleep patterns- Sleeps 7-8 hours at night.  Wakes feeling rested.  CPAP in use.   Health maintenance gaps- closed.  Patient Concerns: None at this time. Follow up with PCP as needed.  Exercise Activities and Dietary recommendations Current Exercise Habits: Home exercise routine, Type of exercise: calisthenics;stretching;strength training/weights, Time (Minutes): > 60, Frequency (Times/Week): 5, Weekly Exercise (Minutes/Week): 0, Intensity: Moderate  Goals    . Weight less than 200lb     Low carb diet, low fat diet Portion control       Fall Risk Fall Risk  09/23/2017 09/22/2016 01/30/2016 09/11/2015 07/30/2015  Falls in the past year? Yes Yes No No No  Number falls in past yr: 2 or more 1 - - -  Injury with Fall? No No - - -  Follow up Education provided;Falls prevention discussed Falls prevention discussed - - -  Comment First fall the floor was slick, the second fall he missed a step - - - -   Depression Screen PHQ 2/9 Scores 09/23/2017 09/22/2016 01/30/2016 09/11/2015  PHQ - 2 Score 0 0 0 0    Cognitive Function MMSE - Mini Mental State Exam 09/23/2017 09/22/2016 09/11/2015  Orientation to time 5 5 5   Orientation to Place 5 5 5   Registration 3 3 3   Attention/ Calculation 5 5 5   Recall 3 1 3   Recall-comments - 1 out of 3 words recalled -  Language- name 2 objects 2 2 2   Language- repeat 1 1 1   Language- follow 3 step command 3 3 3   Language- read & follow direction 1 1 1   Write  a sentence 1 1 1   Copy design 1 1 1   Total score 30 28 30         Immunization History    Administered Date(s) Administered  . Influenza, High Dose Seasonal PF 05/26/2016, 07/10/2017  . Influenza,inj,Quad PF,6+ Mos 06/10/2014  . Influenza-Unspecified 06/30/2015  . Pneumococcal Conjugate-13 08/10/2013  . Pneumococcal Polysaccharide-23 08/14/2014  . Tdap 11/12/2013  . Zoster 04/19/2014   Screening Tests Health Maintenance  Topic Date Due  . TETANUS/TDAP  11/13/2023  . INFLUENZA VACCINE  Completed  . PNA vac Low Risk Adult  Completed      Plan:    End of life planning; Advance aging; Advanced directives discussed. Copy of current HCPOA/Living Will requested.    I have personally reviewed and noted the following in the patient's chart:   . Medical and social history . Use of alcohol, tobacco or illicit drugs  . Current medications and supplements . Functional ability and status . Nutritional status . Physical activity . Advanced directives . List of other physicians . Hospitalizations, surgeries, and ER visits in previous 12 months . Vitals . Screenings to include cognitive, depression, and falls . Referrals and appointments  In addition, I have reviewed and discussed with patient certain preventive protocols, quality metrics, and best practice recommendations. A written personalized care plan for preventive services as well as general preventive health recommendations were provided to patient.    I have reviewed the above information and agree with above.   Deborra Medina, MD  Varney Biles, LPN  [id number]

## 2017-09-23 NOTE — Patient Instructions (Addendum)
  Jeffery Reynolds , Thank you for taking time to come for your Medicare Wellness Visit. I appreciate your ongoing commitment to your health goals. Please review the following plan we discussed and let me know if I can assist you in the future.   Follow up with Dr. Derrel Nip as needed.    Bring a copy of your [place] and/or Living Will to be scanned into chart.  Have a great day!  These are the goals we discussed: Goals    . Weight less than 200lb     Low carb diet, low fat diet Portion control       This is a list of the screening recommended for you and due dates:  Health Maintenance  Topic Date Due  . Tetanus Vaccine  11/13/2023  . Flu Shot  Completed  . Pneumonia vaccines  Completed

## 2017-10-14 ENCOUNTER — Encounter: Payer: Self-pay | Admitting: Internal Medicine

## 2017-10-14 ENCOUNTER — Ambulatory Visit (INDEPENDENT_AMBULATORY_CARE_PROVIDER_SITE_OTHER): Payer: Medicare Other | Admitting: Internal Medicine

## 2017-10-14 VITALS — BP 120/78 | HR 72 | Temp 97.5°F | Resp 15 | Ht 68.0 in | Wt 211.2 lb

## 2017-10-14 DIAGNOSIS — E538 Deficiency of other specified B group vitamins: Secondary | ICD-10-CM | POA: Diagnosis not present

## 2017-10-14 DIAGNOSIS — R7301 Impaired fasting glucose: Secondary | ICD-10-CM | POA: Diagnosis not present

## 2017-10-14 DIAGNOSIS — J302 Other seasonal allergic rhinitis: Secondary | ICD-10-CM

## 2017-10-14 DIAGNOSIS — E6609 Other obesity due to excess calories: Secondary | ICD-10-CM

## 2017-10-14 DIAGNOSIS — I10 Essential (primary) hypertension: Secondary | ICD-10-CM

## 2017-10-14 DIAGNOSIS — E559 Vitamin D deficiency, unspecified: Secondary | ICD-10-CM | POA: Diagnosis not present

## 2017-10-14 DIAGNOSIS — H04123 Dry eye syndrome of bilateral lacrimal glands: Secondary | ICD-10-CM | POA: Diagnosis not present

## 2017-10-14 DIAGNOSIS — M25562 Pain in left knee: Secondary | ICD-10-CM

## 2017-10-14 DIAGNOSIS — G8929 Other chronic pain: Secondary | ICD-10-CM | POA: Diagnosis not present

## 2017-10-14 DIAGNOSIS — Z6832 Body mass index (BMI) 32.0-32.9, adult: Secondary | ICD-10-CM | POA: Diagnosis not present

## 2017-10-14 LAB — VITAMIN D 25 HYDROXY (VIT D DEFICIENCY, FRACTURES): VITD: 27.52 ng/mL — ABNORMAL LOW (ref 30.00–100.00)

## 2017-10-14 LAB — VITAMIN B12: Vitamin B-12: 457 pg/mL (ref 211–911)

## 2017-10-14 MED ORDER — FUROSEMIDE 20 MG PO TABS: ORAL_TABLET | ORAL | 2 refills | Status: DC

## 2017-10-14 MED ORDER — HYDROCODONE-ACETAMINOPHEN 5-325 MG PO TABS: ORAL_TABLET | ORAL | 0 refills | Status: DC

## 2017-10-14 NOTE — Patient Instructions (Addendum)
You  Have allergies  To pollen !    This is in addition  To your  DRY EYE.   You should resume  an artificial tear or lubricating eye drop every 2 hours for dry eyes,  Systane or Refresh  Every 2 hours   Continue azelastine  Eye drops  Because this for allergies    For your sneezing Also consider adding generic claritin ("loratidine ')

## 2017-10-14 NOTE — Progress Notes (Signed)
Subjective:  Patient ID: Jeffery Reynolds, male    DOB: October 21, 1933  Age: 84 y.o. MRN: [id number]  CC: The primary encounter diagnosis was Vitamin D deficiency. Diagnoses of B12 deficiency, Essential hypertension, Seasonal allergies, Chronic dryness of both eyes, Chronic pain of left knee, Impaired fasting glucose, and Class 1 obesity due to excess calories without serious comorbidity with body mass index (BMI) of 32.0 to 32.9 in adult were also pertinent to this visit.  HPI Jeffery Reynolds presents for medication refill. Patient  has chronic knee pain due to DJD that is managed with  NSAIDS and a stable dose of vicodin    Cc:  Eyes itching. Has dry eye  Sees Dr Matilde Sprang. Saw him  m this morning,  No improvement with his prescribed azelastine lubricating eye drops not using dry eye drops any more.  Sneezing a lot   Riding stationery bike 45  To 60 mingurs daily  Along with weight lifting.  Can bench press 200 lbs!  Avoiding activities that aggravated knee pain     Outpatient Medications Prior to Visit  Medication Sig Dispense Refill  . allopurinol (ZYLOPRIM) 300 MG tablet Take on Monday, Wednesday and Friday. 90 tablet 2  . ALPRAZolam (XANAX) 0.25 MG tablet Take 1 tablet (0.25 mg total) by mouth at bedtime. 30 tablet 0  . Artificial Tear Ointment (DRY EYES OP) Apply 1 drop 3 (three) times daily to eye.    Marland Kitchen atorvastatin (LIPITOR) 40 MG tablet TAKE 1 TABLET (40 MG TOTAL) BY MOUTH DAILY. 90 tablet 1  . metoprolol tartrate (LOPRESSOR) 50 MG tablet TAKE 1 TABLET(50 MG) BY MOUTH TWICE DAILY 180 tablet 3  . traMADol (ULTRAM) 50 MG tablet Take 1 tablet (50 mg total) by mouth every 6 (six) hours as needed. Maximum 3 daily 90 tablet 5  . traZODone (DESYREL) 150 MG tablet TAKE 1 TABLET (150 MG TOTAL) BY MOUTH AT BEDTIME. 90 tablet 1  . trimethoprim-polymyxin b (POLYTRIM) ophthalmic solution INT 1 GTT IN OU Q 3 H WA  0  . warfarin (COUMADIN) 5 MG tablet TAKE 1 TABLET DAILY OR AS DIRECTED BY COUMADIN  CLINIC 90 tablet 0  . furosemide (LASIX) 20 MG tablet furosemide 20 mg tablet    . HYDROcodone-acetaminophen (NORCO/VICODIN) 5-325 MG tablet Take 1 tablet once daily as needed. 30 tablet 0  . HYDROcodone-acetaminophen (NORCO/VICODIN) 5-325 MG tablet Take 1 tablet once daily as needed. 30 tablet 0  . HYDROcodone-acetaminophen (NORCO/VICODIN) 5-325 MG tablet Take 1 tablet once daily as needed. 31 tablet 0   No facility-administered medications prior to visit.     Review of Systems;  Patient denies headache, fevers, malaise, unintentional weight loss, skin rash, eye pain, sinus congestion and sinus pain, sore throat, dysphagia,  hemoptysis , cough, dyspnea, wheezing, chest pain, palpitations, orthopnea, edema, abdominal pain, nausea, melena, diarrhea, constipation, flank pain, dysuria, hematuria, urinary  Frequency, nocturia, numbness, tingling, seizures,  Focal weakness, Loss of consciousness,  Tremor, insomnia, depression, anxiety, and suicidal ideation.      Objective:  BP 120/78 (BP Location: Left Arm, Patient Position: Sitting, Cuff Size: Normal)   Pulse 72   Temp (!) 97.5 F (36.4 C) (Oral)   Resp 15   Ht 5\' 8"  (1.727 m)   Wt 211 lb 3.2 oz (95.8 kg)   SpO2 97%   BMI 32.11 kg/m   BP Readings from Last 3 Encounters:  10/14/17 120/78  09/23/17 110/62  07/10/17 100/62    Wt Readings from Last 3  Encounters:  10/14/17 211 lb 3.2 oz (95.8 kg)  09/23/17 206 lb 6.4 oz (93.6 kg)  07/10/17 211 lb 12.8 oz (96.1 kg)    General appearance: alert, cooperative and appears stated age Ears: normal TM's and external ear canals both ears Throat: lips, mucosa, and tongue normal; teeth and gums normal Neck: no adenopathy, no carotid bruit, supple, symmetrical, trachea midline and thyroid not enlarged, symmetric, no tenderness/mass/nodules Back: symmetric, no curvature. ROM normal. No CVA tenderness. Lungs: clear to auscultation bilaterally Heart: regular rate and rhythm, S1, S2 normal, no  murmur, click, rub or gallop Abdomen: soft, non-tender; bowel sounds normal; no masses,  no organomegaly Pulses: 2+ and symmetric Skin: Skin color, texture, turgor normal. No rashes or lesions Lymph nodes: Cervical, supraclavicular, and axillary nodes normal.  Lab Results  Component Value Date   HGBA1C 5.4 01/26/2017   HGBA1C 5.1 04/18/2014    Lab Results  Component Value Date   CREATININE 1.24 08/12/2017   CREATININE 1.09 01/26/2017   CREATININE 1.02 10/16/2016    Lab Results  Component Value Date   WBC 4.8 08/12/2017   HGB 14.4 08/12/2017   HCT 43.5 08/12/2017   PLT 97.0 (L) 08/12/2017   GLUCOSE 101 (H) 08/12/2017   CHOL 103 08/12/2017   TRIG 155.0 (H) 08/12/2017   HDL 40.30 08/12/2017   LDLDIRECT 45.0 09/11/2015   LDLCALC 32 08/12/2017   ALT 32 08/12/2017   AST 24 08/12/2017   NA 140 08/12/2017   K 4.4 08/12/2017   CL 106 08/12/2017   CREATININE 1.24 08/12/2017   BUN 24 (H) 08/12/2017   CO2 30 08/12/2017   TSH 2.43 09/11/2015   INR 2.5 09/23/2017   HGBA1C 5.4 01/26/2017    Dg Chest 2 View  Result Date: 10/16/2016 CLINICAL DATA:  Hemoptysis. EXAM: CHEST  2 VIEW COMPARISON:  None. FINDINGS: Postsurgical changes from CABG. Cardiomediastinal silhouette is normal. Mediastinal contours appear intact. Calcific atherosclerotic disease and tortuosity of the aorta noted. There is no evidence of lobar airspace consolidation, pleural effusion or pneumothorax. Peribronchial thickening with central predominance bilaterally. Osseous structures are without acute abnormality. 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 Soft tissues are grossly normal. IMPRESSION: Peribronchial thickening with central predominance usually associated with acute bronchitis or reactive airway disease. No evidence of lobar consolidation. Electronically Signed   By: Fidela Salisbury M.D.   On: 10/16/2016 12:14   US Venous Img Lower Unilateral Left  Result  Date: 10/16/2016 CLINICAL DATA:  84 y/o  M; left calf pain and swelling for 3 days. EXAM: LEFT LOWER EXTREMITY VENOUS DOPPLER ULTRASOUND TECHNIQUE: Gray-scale sonography with graded compression, as well as color Doppler and duplex ultrasound were performed to evaluate the lower extremity deep venous systems from the level of the common femoral vein and including the common femoral, femoral, profunda femoral, popliteal and calf veins including the posterior tibial, peroneal and gastrocnemius veins when visible. The superficial great saphenous vein was also interrogated. Spectral Doppler was utilized to evaluate flow at rest and with distal augmentation maneuvers in the common femoral, femoral and popliteal veins. COMPARISON:  None. FINDINGS: Contralateral Common Femoral Vein: Respiratory phasicity is normal and symmetric with the symptomatic side. No evidence of thrombus. Normal compressibility. Common Femoral Vein: No evidence of thrombus. Normal compressibility, respiratory phasicity and response to augmentation. Saphenofemoral Junction: No evidence of thrombus. Normal compressibility and flow on color Doppler imaging. Profunda Femoral Vein: No evidence of thrombus. Normal compressibility and flow on color Doppler imaging. Femoral  Vein: No evidence of thrombus. Normal compressibility, respiratory phasicity and response to augmentation. Popliteal Vein: No evidence of thrombus. Normal compressibility, respiratory phasicity and response to augmentation. Calf Veins: No evidence of thrombus. Normal compressibility and flow on color Doppler imaging. Superficial Great Saphenous Vein: No evidence of thrombus. Normal compressibility and flow on color Doppler imaging. Venous Reflux:  None. Other Findings:  None. IMPRESSION: No evidence of deep venous thrombosis. Electronically Signed   By: Kristine Garbe M.D.   On: 10/16/2016 16:48    Assessment & Plan:   Problem List Items Addressed This Visit    Essential  hypertension    Well controlled on current regimen. Renal function stable, no changes today.  Lab Results  Component Value Date   CREATININE 1.24 08/12/2017   Lab Results  Component Value Date   NA 140 08/12/2017   K 4.4 08/12/2017   CL 106 08/12/2017   CO2 30 08/12/2017         Relevant Medications   furosemide (LASIX) 20 MG tablet   Seasonal allergies    Now with allergic rhinitis and conjunctivitis.  Advised to add daily oral antihistamine      Chronic dryness of both eyes    Advised to resume daily use of Systaine or Refresh eye drops in addition to azelastine       Chronic pain of left knee    He is managing his pain with three tramadol and one Vicodin daily . Refill history confirmed via [place] Controlled Substance database, accessed by me today.Marland Kitchen Refills  For February, March and April given on Vicodin       Relevant Medications   HYDROcodone-acetaminophen (NORCO/VICODIN) 5-325 MG tablet   HYDROcodone-acetaminophen (NORCO/VICODIN) 5-325 MG tablet   HYDROcodone-acetaminophen (NORCO/VICODIN) 5-325 MG tablet   Obesity    Body mass index is 32.11 kg/m. I have addressed  BMI and recommended a low glycemic index diet utilizing smaller more frequent meals to increase metabolism.  I have also commended him for his regular regimenof exercise 5 days per week. Screening for lipid disorders, thyroid and diabetes has been done       Impaired fasting glucose    A1c is normal.  Will check annually  Lab Results  Component Value Date   HGBA1C 5.4 01/26/2017          Other Visit Diagnoses    Vitamin D deficiency    -  Primary   Relevant Orders   VITAMIN D 25 Hydroxy (Vit-D Deficiency, Fractures) (Completed)   B12 deficiency       Relevant Orders   B12 (Completed)   RBC Folate      I have changed Zayn T. Poblano's furosemide. I am also having him maintain his traMADol, allopurinol, metoprolol tartrate, warfarin, traZODone, atorvastatin, trimethoprim-polymyxin b,  Artificial Tear Ointment (DRY EYES OP), ALPRAZolam, HYDROcodone-acetaminophen, HYDROcodone-acetaminophen, and HYDROcodone-acetaminophen.  Meds ordered this encounter  Medications  . furosemide (LASIX) 20 MG tablet    Sig: One tablet every other day for fluid retention    Dispense:  45 tablet    Refill:  2  . HYDROcodone-acetaminophen (NORCO/VICODIN) 5-325 MG tablet    Sig: Take 1 tablet once daily as needed.    Dispense:  31 tablet    Refill:  0    MAY REFILL ON OR AFTER October 16 2017  . HYDROcodone-acetaminophen (NORCO/VICODIN) 5-325 MG tablet    Sig: Take 1 tablet once daily as needed.    Dispense:  31 tablet    Refill:  0    MAY REFILL ON OR AFTER  November 13 2017  . HYDROcodone-acetaminophen (NORCO/VICODIN) 5-325 MG tablet    Sig: Take 1 tablet once daily as needed.    Dispense:  31 tablet    Refill:  0    MAY REFILL ON OR AFTER December 14 2017    Medications Discontinued During This Encounter  Medication Reason  . furosemide (LASIX) 20 MG tablet Reorder  . HYDROcodone-acetaminophen (NORCO/VICODIN) 5-325 MG tablet   . HYDROcodone-acetaminophen (NORCO/VICODIN) 5-325 MG tablet   . HYDROcodone-acetaminophen (NORCO/VICODIN) 5-325 MG tablet     Follow-up: Return in about 3 months (around 01/11/2018) for 15  min ok .   Crecencio Mc, MD

## 2017-10-15 LAB — FOLATE RBC: RBC Folate: 716 ng/mL RBC (ref >280)

## 2017-10-15 NOTE — Assessment & Plan Note (Signed)
Well controlled on current regimen. Renal function stable, no changes today.  Lab Results  Component Value Date   CREATININE 1.24 08/12/2017   Lab Results  Component Value Date   NA 140 08/12/2017   K 4.4 08/12/2017   CL 106 08/12/2017   CO2 30 08/12/2017

## 2017-10-15 NOTE — Assessment & Plan Note (Signed)
Now with allergic rhinitis and conjunctivitis.  Advised to add daily oral antihistamine

## 2017-10-15 NOTE — Assessment & Plan Note (Signed)
A1c is normal.  Will check annually  Lab Results  Component Value Date   HGBA1C 5.4 01/26/2017

## 2017-10-15 NOTE — Assessment & Plan Note (Signed)
He is managing his pain with three tramadol and one Vicodin daily . Refill history confirmed via [place] Controlled Substance database, accessed by me today.Marland Kitchen Refills  For February, March and April given on Vicodin

## 2017-10-15 NOTE — Assessment & Plan Note (Signed)
Advised to resume daily use of Systaine or Refresh eye drops in addition to azelastine

## 2017-10-15 NOTE — Assessment & Plan Note (Signed)
Body mass index is 32.11 kg/m. I have addressed  BMI and recommended a low glycemic index diet utilizing smaller more frequent meals to increase metabolism.  I have also commended him for his regular regimenof exercise 5 days per week. Screening for lipid disorders, thyroid and diabetes has been done

## 2017-10-26 ENCOUNTER — Ambulatory Visit (INDEPENDENT_AMBULATORY_CARE_PROVIDER_SITE_OTHER): Payer: Medicare Other

## 2017-10-26 DIAGNOSIS — Z5181 Encounter for therapeutic drug level monitoring: Secondary | ICD-10-CM | POA: Diagnosis not present

## 2017-10-26 DIAGNOSIS — I481 Persistent atrial fibrillation: Secondary | ICD-10-CM

## 2017-10-26 DIAGNOSIS — I4819 Other persistent atrial fibrillation: Secondary | ICD-10-CM

## 2017-10-26 LAB — POCT INR: INR: 1.8

## 2017-10-26 NOTE — Patient Instructions (Signed)
Please take 2 tablets today, then resume taking 1 tablet daily.   Recheck in 4 weeks.

## 2017-10-29 ENCOUNTER — Other Ambulatory Visit: Payer: Self-pay | Admitting: Internal Medicine

## 2017-10-30 NOTE — Telephone Encounter (Signed)
Refilled: 08/21/2017 Last OV: 10/14/2017 Next OV: 09/24/2018

## 2017-10-30 NOTE — Telephone Encounter (Signed)
Printed, signed and faxed.  

## 2017-11-13 ENCOUNTER — Ambulatory Visit: Payer: Medicare Other | Admitting: Cardiovascular Disease

## 2017-11-23 ENCOUNTER — Ambulatory Visit (INDEPENDENT_AMBULATORY_CARE_PROVIDER_SITE_OTHER): Payer: Medicare Other

## 2017-11-23 DIAGNOSIS — I481 Persistent atrial fibrillation: Secondary | ICD-10-CM

## 2017-11-23 DIAGNOSIS — I4819 Other persistent atrial fibrillation: Secondary | ICD-10-CM

## 2017-11-23 DIAGNOSIS — Z5181 Encounter for therapeutic drug level monitoring: Secondary | ICD-10-CM | POA: Diagnosis not present

## 2017-11-23 LAB — POCT INR: INR: 3.1

## 2017-11-23 NOTE — Patient Instructions (Signed)
Please have a large serving of greens today, then resume taking 1 tablet daily.   Recheck in 4 weeks.

## 2017-12-07 DIAGNOSIS — H5203 Hypermetropia, bilateral: Secondary | ICD-10-CM | POA: Diagnosis not present

## 2017-12-07 DIAGNOSIS — E7800 Pure hypercholesterolemia, unspecified: Secondary | ICD-10-CM | POA: Diagnosis not present

## 2017-12-07 DIAGNOSIS — H04123 Dry eye syndrome of bilateral lacrimal glands: Secondary | ICD-10-CM | POA: Diagnosis not present

## 2017-12-07 DIAGNOSIS — H2513 Age-related nuclear cataract, bilateral: Secondary | ICD-10-CM | POA: Diagnosis not present

## 2017-12-07 DIAGNOSIS — H52223 Regular astigmatism, bilateral: Secondary | ICD-10-CM | POA: Diagnosis not present

## 2017-12-07 DIAGNOSIS — H524 Presbyopia: Secondary | ICD-10-CM | POA: Diagnosis not present

## 2017-12-07 DIAGNOSIS — H1089 Other conjunctivitis: Secondary | ICD-10-CM | POA: Diagnosis not present

## 2017-12-21 ENCOUNTER — Ambulatory Visit (INDEPENDENT_AMBULATORY_CARE_PROVIDER_SITE_OTHER): Payer: Medicare Other

## 2017-12-21 DIAGNOSIS — Z5181 Encounter for therapeutic drug level monitoring: Secondary | ICD-10-CM | POA: Diagnosis not present

## 2017-12-21 DIAGNOSIS — I481 Persistent atrial fibrillation: Secondary | ICD-10-CM

## 2017-12-21 DIAGNOSIS — I4819 Other persistent atrial fibrillation: Secondary | ICD-10-CM

## 2017-12-21 LAB — POCT INR: INR: 2.5

## 2017-12-21 NOTE — Patient Instructions (Signed)
Please continue taking 1 tablet daily.   Recheck in 5 weeks

## 2017-12-24 ENCOUNTER — Ambulatory Visit (INDEPENDENT_AMBULATORY_CARE_PROVIDER_SITE_OTHER): Payer: Medicare Other | Admitting: Physician Assistant

## 2017-12-24 ENCOUNTER — Encounter: Payer: Self-pay | Admitting: Physician Assistant

## 2017-12-24 VITALS — BP 118/72 | HR 56 | Ht 67.5 in | Wt 209.2 lb

## 2017-12-24 DIAGNOSIS — I872 Venous insufficiency (chronic) (peripheral): Secondary | ICD-10-CM

## 2017-12-24 DIAGNOSIS — I482 Chronic atrial fibrillation, unspecified: Secondary | ICD-10-CM

## 2017-12-24 DIAGNOSIS — I2510 Atherosclerotic heart disease of native coronary artery without angina pectoris: Secondary | ICD-10-CM

## 2017-12-24 DIAGNOSIS — E785 Hyperlipidemia, unspecified: Secondary | ICD-10-CM

## 2017-12-24 DIAGNOSIS — I10 Essential (primary) hypertension: Secondary | ICD-10-CM | POA: Diagnosis not present

## 2017-12-24 NOTE — Progress Notes (Signed)
Cardiology Office Note Date:  12/24/2017  Patient ID:  Jeffery Reynolds, DOB [birth year], MRN [id number] PCP:  Crecencio Mc, MD  Cardiologist:  Dr. Fletcher Anon, MD    Chief Complaint: Follow up  History of Present Illness: Jeffery Reynolds is a 84 y.o. male with history of CAD s/p 6-vessel CABG in [place], [place] in 1995, chronic Afib on Coumadin, venous stasis followed by vascular, mild carotid artery stenosis by ultrasound in 2016, HTN, HLD, mild cognitive impairment, GERD, arthritis, obesity, OSA, and thrombocytopenia who presents for routine follow up of his CAD and Afib.   He was most recently seen in the office on 10/28/2016 for routine, 12 month follow up and was doing reasonably well. He had just gotten over bronchitis at that time as well as left lower extremity swelling with a negative ultrasound for DVT. He was exercising regularly, without any exertional symptoms.   Most recent echo from 11/2008, from [facility], showed an EF of 00%, mild diastolic dysfunction, no significant valvular abnormalities, mild ascending aortic root dilatation, moderate left atrial enlargement, technically difficult study. Myoview from 11/2010, also at the [facility], showed a mild intensity, fixed perfusion defect involving the basal to mid anterior wall consistent with injury to the diagonal distribution (this finding was also noted on prior study dating back to 2008). Overall, no ischemia was noted and there was no high-risk prognostic indicators based on this study. EF 63%. He underwent lower extremity arterial ABI which was notable for noncompressible ankle pressures felt to be most likely due to medial calcification with normal bilateral Doppler waveforms and toe brachial indices suggesting normal arterial perfusion to the bilateral lower extremities.     Most recent INR from 12/21/2017 of 2.5 (goal 2.0-3.0), INR has been therapeutic since 11/23/2017. Most recent LDL from 07/2017 of  32 with normal LFT and renal function. CBC from 07/2017 showed a low, though stable platelet count of 97 with a normal HGB of 12.4.   He comes in doing well today.  No chest pain, shortness of breath, palpitations, dizziness, presyncope, or syncope.  No recent falls.  No BRBPR or melena.  He continues to exercise for 1 to 2 hours at the gym 5 days/week without any symptoms.  His chronic venous stasis is stable.  He is not interested in any further evaluation/interventions of this.  Blood pressure and heart rate remained well controlled.  Tolerating Coumadin without any issues.  He does not have any concerns today.  Past Medical History:  Diagnosis Date  . Arthritis   . CAD (coronary artery disease)    a. s/p 6-V CABG in [place] in 1995; b. MV 4/12: mild intensity, fixed perfusion defect involving the basal to mid anterior wall consistent with injury to the diagonal distribution (this finding was also noted on prior study dating back to 2008). Overall, no ischemia was noted and there was no high-risk prognostic indicators based on this study. EF 63%  . Carotid artery disease ([place])    a. ultrasound 12/16: 1-39% bilateral ICA stenosis  . Chronic atrial fibrillation ([place]) 2000   a. CHADS2VASc => 4 (HTN, age x 2, vascular disease); b. on Coumadin [facility]  . Chronic back pain   . Cognitive impairment   . Colon polyps   . Diastolic dysfunction    a. TTE 4/10: EF of 86%, mild diastolic dysfunction, no significant valvular abnormalities, mild ascending aortic root dilatation, moderate left atrial enlargement, technically difficult study  .  Genital warts   . GERD (gastroesophageal reflux disease)   . Gout    Controlled with Allopurinol  . History of blood transfusion   . History of chicken pox   . Hyperlipidemia   . Hypertension   . Seasonal allergies   . Sleep apnea, obstructive   . Thrombocytopathia [facility])     Past Surgical History:  Procedure Laterality Date  . CARDIAC  CATHETERIZATION    . CORONARY ARTERY BYPASS GRAFT  1995   CABG x 6 in [place], [place]    . GYNECOMASTIA EXCISION    . KNEE SURGERY     bilateral   . ROTATOR CUFF REPAIR     right  . TONSILLECTOMY AND ADENOIDECTOMY  1943    Current Meds  Medication Sig  . allopurinol (ZYLOPRIM) 300 MG tablet Take on Monday, Wednesday and Friday.  . ALPRAZolam (XANAX) 0.25 MG tablet TAKE 1 TABLET BY MOUTH AT BEDTIME  . Artificial Tear Ointment (DRY EYES OP) Apply 1 drop 3 (three) times daily to eye.  Marland Kitchen atorvastatin (LIPITOR) 40 MG tablet TAKE 1 TABLET (40 MG TOTAL) BY MOUTH DAILY.  . furosemide (LASIX) 20 MG tablet One tablet every other day for fluid retention  . HYDROcodone-acetaminophen (NORCO/VICODIN) 5-325 MG tablet Take 1 tablet once daily as needed.  . metoprolol tartrate (LOPRESSOR) 50 MG tablet TAKE 1 TABLET(50 MG) BY MOUTH TWICE DAILY  . traMADol (ULTRAM) 50 MG tablet Take 1 tablet (50 mg total) by mouth every 6 (six) hours as needed. Maximum 3 daily  . traZODone (DESYREL) 150 MG tablet TAKE 1 TABLET (150 MG TOTAL) BY MOUTH AT BEDTIME.  Marland Kitchen trimethoprim-polymyxin b (POLYTRIM) ophthalmic solution INT 1 GTT IN OU Q 3 H WA  . warfarin (COUMADIN) 5 MG tablet TAKE 1 TABLET DAILY OR AS DIRECTED BY COUMADIN CLINIC    Allergies:   Patient has no known allergies.   Social History:  The patient  reports that he has never smoked. He has never used smokeless tobacco. He reports that he drinks alcohol. He reports that he does not use drugs.   Family History:  The patient's family history includes Alcohol abuse in his father, maternal grandfather, paternal grandfather, son, and son; Cancer in his brother and mother; Crohn's disease in his daughter; Dementia in his brother; Diabetes in his brother; Heart attack (age of onset: 28) in his father; Heart attack (age of onset: 44) in his paternal grandfather; Heart attack (age of onset: 39) in his paternal grandmother; Heart disease in his father;  Hyperlipidemia in his father; Hypertension in his father.  ROS:   Review of Systems  Constitutional: Negative for chills, diaphoresis, fever, malaise/fatigue and weight loss.  HENT: Negative for congestion.   Eyes: Negative for discharge and redness.  Respiratory: Negative for cough, hemoptysis, sputum production, shortness of breath and wheezing.   Cardiovascular: Positive for leg swelling. Negative for chest pain, palpitations, orthopnea, claudication and PND.  Gastrointestinal: Negative for abdominal pain, blood in stool, heartburn, melena, nausea and vomiting.  Genitourinary: Negative for hematuria.  Musculoskeletal: Negative for falls and myalgias.  Skin: Negative for rash.  Neurological: Negative for dizziness, tingling, tremors, sensory change, speech change, focal weakness, loss of consciousness and weakness.  Endo/Heme/Allergies: Does not bruise/bleed easily.  Psychiatric/Behavioral: Negative for substance abuse. The patient is not nervous/anxious.   All other systems reviewed and are negative.    PHYSICAL EXAM:  VS:  BP 118/72 (BP Location: Left Arm, Patient Position: Sitting, Cuff Size: Normal)  Pulse (!) 56   Ht 5' 7.5" (1.715 m)   Wt 209 lb 4 oz (94.9 kg)   BMI 32.29 kg/m  BMI: Body mass index is 32.29 kg/m.  Physical Exam  Constitutional: He is oriented to person, place, and time. He appears well-developed and well-nourished.  HENT:  Head: Normocephalic and atraumatic.  Eyes: Right eye exhibits no discharge. Left eye exhibits no discharge.  Neck: Normal range of motion. No JVD present.  Cardiovascular: Normal rate, S1 normal, S2 normal and normal heart sounds. An irregularly irregular rhythm present. Exam reveals no distant heart sounds, no friction rub, no midsystolic click and no opening snap.  No murmur heard. Pulses:      Posterior tibial pulses are 2+ on the right side, and 2+ on the left side.  Pulmonary/Chest: Effort normal and breath sounds normal. No  respiratory distress. He has no decreased breath sounds. He has no wheezes. He has no rales. He exhibits no tenderness.  Abdominal: Soft. He exhibits no distension. There is no tenderness.  Musculoskeletal: He exhibits edema.  Trace to 1+ bilateral lower extremity pitting edema to the knees with woody appearance consistent with chronic venous insufficiency  Neurological: He is alert and oriented to person, place, and time.  Skin: Skin is warm and dry. No cyanosis. Nails show no clubbing.  Psychiatric: He has a normal mood and affect. His speech is normal and behavior is normal. Judgment and thought content normal.     EKG:  Was ordered and interpreted by me today. Shows A. fib with slow ventricular response, 56 bpm, no acute ST-T changes  Recent Labs: 08/12/2017: ALT 32; BUN 24; Creatinine, Ser 1.24; Hemoglobin 14.4; Platelets 97.0; Potassium 4.4; Sodium 140  08/12/2017: Cholesterol 103; HDL 40.30; LDL Cholesterol 32; Total CHOL/HDL Ratio 3; Triglycerides 155.0; VLDL 31.0   CrCl cannot be calculated (Patient's most recent lab result is older than the maximum 21 days allowed.).   Wt Readings from Last 3 Encounters:  12/24/17 209 lb 4 oz (94.9 kg)  10/14/17 211 lb 3.2 oz (95.8 kg)  09/23/17 206 lb 6.4 oz (93.6 kg)     Other studies reviewed: Additional studies/records reviewed today include: summarized above  ASSESSMENT AND PLAN:  1. Chronic A. Fib: Ventricular rates are well controlled.  He is asymptomatic.  Continue Lopressor 50 mg twice daily.  Tolerating Coumadin without issues with most recent INR therapeutic as above.  He does have chronic thrombocytopenia which has been stable and is followed by his PCP.  No changes.  2. CAD of the native coronary arteries without angina: No symptoms concerning for chest pain at this time.  On Coumadin in place of aspirin.  Continue aggressive secondary prevention.  Continue active lifestyle including daily gym routine.  Continue current  medications.  No plans for ischemic evaluation at this time.  3. Hypertension: Blood pressure well controlled today as above.  Continue current medications.  4. Hyperlipidemia: LDL of 32 from 07/2017.  Continue Lipitor 40 mg daily.  Followed by PCP.  5. Chronic venous insufficiency: Stable.  Followed by vein and vascular.  Not interested in any further evaluations or therapies.  Does not want to wear compression stockings at this time.  Recommend patient elevate legs.  Disposition: F/u with Dr. Fletcher Anon in 12 months.   Current medicines are reviewed at length with the patient today.  The patient did not have any concerns regarding medicines.  Signed, Christell Faith, PA-C 12/24/2017 11:30 AM     [facility] -  [address], [place] [address] [phone]

## 2017-12-24 NOTE — Patient Instructions (Signed)
Medication Instructions:  Your physician recommends that you continue on your current medications as directed. Please refer to the Current Medication list given to you today.   Labwork: none  Testing/Procedures: none  Follow-Up: Your physician wants you to follow-up in: [place]. You will receive a reminder letter in the mail two months in advance. If you don't receive a letter, please call our office to schedule the follow-up appointment.   If you need a refill on your cardiac medications before your next appointment, please call your pharmacy.

## 2018-01-04 ENCOUNTER — Telehealth: Payer: Self-pay

## 2018-01-04 ENCOUNTER — Ambulatory Visit: Payer: Medicare Other | Admitting: Internal Medicine

## 2018-01-04 NOTE — Telephone Encounter (Signed)
Copied from [place] [id number]. Topic: General - Other >> Jan 04, 2018  4:14 PM Valla Leaver wrote: Reason for CRM: Patient would like the no show fee for missing todays appt removed. He says he'd forgetten his appointment. He will be in on Wednesday 05/15 at 8:30am.

## 2018-01-05 NOTE — Telephone Encounter (Signed)
The appointment has been canceled. A no show fee had not be applied .

## 2018-01-06 ENCOUNTER — Ambulatory Visit (INDEPENDENT_AMBULATORY_CARE_PROVIDER_SITE_OTHER): Payer: Medicare Other | Admitting: Internal Medicine

## 2018-01-06 ENCOUNTER — Ambulatory Visit (INDEPENDENT_AMBULATORY_CARE_PROVIDER_SITE_OTHER): Payer: Medicare Other

## 2018-01-06 ENCOUNTER — Encounter: Payer: Self-pay | Admitting: Internal Medicine

## 2018-01-06 VITALS — BP 112/64 | HR 61 | Temp 97.6°F | Resp 14 | Ht 67.5 in | Wt 207.4 lb

## 2018-01-06 DIAGNOSIS — E785 Hyperlipidemia, unspecified: Secondary | ICD-10-CM | POA: Diagnosis not present

## 2018-01-06 DIAGNOSIS — D696 Thrombocytopenia, unspecified: Secondary | ICD-10-CM

## 2018-01-06 DIAGNOSIS — I10 Essential (primary) hypertension: Secondary | ICD-10-CM

## 2018-01-06 DIAGNOSIS — R7301 Impaired fasting glucose: Secondary | ICD-10-CM

## 2018-01-06 DIAGNOSIS — I2510 Atherosclerotic heart disease of native coronary artery without angina pectoris: Secondary | ICD-10-CM | POA: Diagnosis not present

## 2018-01-06 DIAGNOSIS — M19022 Primary osteoarthritis, left elbow: Secondary | ICD-10-CM | POA: Diagnosis not present

## 2018-01-06 DIAGNOSIS — M25522 Pain in left elbow: Secondary | ICD-10-CM

## 2018-01-06 DIAGNOSIS — M93929 Osteochondropathy, unspecified, unspecified upper arm: Secondary | ICD-10-CM

## 2018-01-06 DIAGNOSIS — M25529 Pain in unspecified elbow: Secondary | ICD-10-CM

## 2018-01-06 DIAGNOSIS — F819 Developmental disorder of scholastic skills, unspecified: Secondary | ICD-10-CM | POA: Diagnosis not present

## 2018-01-06 LAB — CBC WITH DIFFERENTIAL/PLATELET
Basophils Absolute: 0 10*3/uL (ref 0.0–0.1)
Basophils Relative: 0.3 % (ref 0.0–3.0)
Eosinophils Absolute: 0.1 10*3/uL (ref 0.0–0.7)
Eosinophils Relative: 2.3 % (ref 0.0–5.0)
HCT: 42.5 % (ref 39.0–52.0)
Hemoglobin: 14.4 g/dL (ref 13.0–17.0)
Lymphocytes Relative: 21 % (ref 12.0–46.0)
Lymphs Abs: 0.8 10*3/uL (ref 0.7–4.0)
MCHC: 33.8 g/dL (ref 30.0–36.0)
MCV: 98.1 fl (ref 78.0–100.0)
Monocytes Absolute: 0.5 10*3/uL (ref 0.1–1.0)
Monocytes Relative: 13.6 % — ABNORMAL HIGH (ref 3.0–12.0)
Neutro Abs: 2.3 10*3/uL (ref 1.4–7.7)
Neutrophils Relative %: 62.8 % (ref 43.0–77.0)
Platelets: 86 10*3/uL — ABNORMAL LOW (ref 150.0–400.0)
RBC: 4.34 Mil/uL (ref 4.22–5.81)
RDW: 14.6 % (ref 11.5–15.5)
WBC: 3.6 10*3/uL — ABNORMAL LOW (ref 4.0–10.5)

## 2018-01-06 LAB — LIPID PANEL
Cholesterol: 85 mg/dL (ref 0–200)
HDL: 38.5 mg/dL — ABNORMAL LOW (ref >39.00)
LDL Cholesterol: 26 mg/dL (ref 0–99)
NonHDL: 46.27
Total CHOL/HDL Ratio: 2
Triglycerides: 99 mg/dL (ref 0.0–149.0)
VLDL: 19.8 mg/dL (ref 0.0–40.0)

## 2018-01-06 LAB — COMPREHENSIVE METABOLIC PANEL
ALT: 31 U/L (ref 0–53)
AST: 20 U/L (ref 0–37)
Albumin: 4 g/dL (ref 3.5–5.2)
Alkaline Phosphatase: 89 U/L (ref 39–117)
BUN: 23 mg/dL (ref 6–23)
CO2: 32 mEq/L (ref 19–32)
Calcium: 9.4 mg/dL (ref 8.4–10.5)
Chloride: 103 mEq/L (ref 96–112)
Creatinine, Ser: 1.17 mg/dL (ref 0.40–1.50)
GFR: 63.38 mL/min (ref >60.00)
Glucose, Bld: 106 mg/dL — ABNORMAL HIGH (ref 70–99)
Potassium: 4.4 mEq/L (ref 3.5–5.1)
Sodium: 141 mEq/L (ref 135–145)
Total Bilirubin: 1.4 mg/dL — ABNORMAL HIGH (ref 0.2–1.2)
Total Protein: 6.5 g/dL (ref 6.0–8.3)

## 2018-01-06 MED ORDER — HYDROCODONE-ACETAMINOPHEN 5-325 MG PO TABS: ORAL_TABLET | ORAL | 0 refills | Status: DC

## 2018-01-06 NOTE — Progress Notes (Signed)
Subjective:  Patient ID: Jeffery Reynolds, male    DOB: 13-Feb-1934  Age: 84 y.o. MRN: [id number]  CC: The primary encounter diagnosis was Left elbow pain. Diagnoses of Hyperlipidemia, unspecified hyperlipidemia type, Learning difficulty due to cognitive limitations, Thrombocytopenia ([place]), Medial epicondyle apophysitis due to overuse, Localized pain of elbow joint, Essential hypertension, and Impaired fasting glucose were also pertinent to this visit.  HPI RADIN RAPTIS presents for follow up on multiple issues   He has been having Left elbow pain since a fall that occurred in December while getting out of a golf cart and transitioning onto a wet wooden porch.  He recalls landing  on both elbows.  .  The following night he had another fall  While descending steps.due to missing the bottom step with right leg , so he fell when the right leg collapsed   He denies any history of balance problems, dizziness or leg weakness.  He exercises 4 times weekly.  The elbow is most painful  when he does any exercise that uses his biceps  muscles (curls and pull backs)   concerned about his attention issues  Has been an issue since high school , but "it never kep him from being successful." . 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   .  Had good mathematical skills (basic) that helped his succeed    He denies short term memory loss. He is primarily concerned about his tendency to engage in conversation with someone , then to lose track of what they are saying because he finishes the conversation "in my mind." Outpatient Medications Prior to Visit  Medication Sig Dispense Refill  . allopurinol (ZYLOPRIM) 300 MG tablet Take on Monday, Wednesday and Friday. 90 tablet 2  . ALPRAZolam (XANAX) 0.25 MG tablet TAKE 1 TABLET BY MOUTH AT BEDTIME 30 tablet 5  . Artificial Tear Ointment  (DRY EYES OP) Apply 1 drop 3 (three) times daily to eye.    Marland Kitchen atorvastatin (LIPITOR) 40 MG tablet TAKE 1 TABLET (40 MG TOTAL) BY MOUTH DAILY. 90 tablet 1  . furosemide (LASIX) 20 MG tablet One tablet every other day for fluid retention 45 tablet 2  . metoprolol tartrate (LOPRESSOR) 50 MG tablet TAKE 1 TABLET(50 MG) BY MOUTH TWICE DAILY 180 tablet 3  . traMADol (ULTRAM) 50 MG tablet Take 1 tablet (50 mg total) by mouth every 6 (six) hours as needed. Maximum 3 daily 90 tablet 5  . traZODone (DESYREL) 150 MG tablet TAKE 1 TABLET (150 MG TOTAL) BY MOUTH AT BEDTIME. 90 tablet 1  . warfarin (COUMADIN) 5 MG tablet TAKE 1 TABLET DAILY OR AS DIRECTED BY COUMADIN CLINIC 90 tablet 0  . HYDROcodone-acetaminophen (NORCO/VICODIN) 5-325 MG tablet Take 1 tablet once daily as needed. 31 tablet 0  . trimethoprim-polymyxin b (POLYTRIM) ophthalmic solution INT 1 GTT IN OU Q 3 H WA  0   No facility-administered medications prior to visit.     Review of Systems;  Patient denies headache, fevers, malaise, unintentional weight loss, skin rash, eye pain, sinus congestion and sinus pain, sore throat, dysphagia,  hemoptysis , cough, dyspnea, wheezing, chest pain, palpitations, orthopnea, edema, abdominal pain, nausea, melena, diarrhea, constipation, flank pain, dysuria, hematuria, urinary  Frequency, nocturia, numbness, tingling, seizures,  Focal weakness, Loss of consciousness,  Tremor, insomnia, depression, anxiety, and suicidal ideation.  Objective:  BP 112/64 (BP Location: Left Arm, Patient Position: Sitting, Cuff Size: Normal)   Pulse 61   Temp 97.6 F (36.4 C) (Oral)   Resp 14   Ht 5' 7.5" (1.715 m)   Wt 207 lb 6.4 oz (94.1 kg)   SpO2 97%   BMI 32.00 kg/m   BP Readings from Last 3 Encounters:  01/06/18 112/64  12/24/17 118/72  10/14/17 120/78    Wt Readings from Last 3 Encounters:  01/06/18 207 lb 6.4 oz (94.1 kg)  12/24/17 209 lb 4 oz (94.9 kg)  10/14/17 211 lb 3.2 oz (95.8 kg)    General  appearance: alert, cooperative and appears stated age Ears: normal TM's and external ear canals both ears Throat: lips, mucosa, and tongue normal; teeth and gums normal Neck: no adenopathy, no carotid bruit, supple, symmetrical, trachea midline and thyroid not enlarged, symmetric, no tenderness/mass/nodules Back: symmetric, no curvature. ROM normal. No CVA tenderness. Lungs: clear to auscultation bilaterally Heart: regular rate and rhythm, S1, S2 normal, no murmur, click, rub or gallop Abdomen: soft, non-tender; bowel sounds normal; no masses,  no organomegaly MSK: left medial elbow pain upon palpation . No effusion ,  Bruising or bulge.   Pulses: 2+ and symmetric Skin: Skin color, texture, turgor normal. No rashes or lesions Lymph nodes: Cervical, supraclavicular, and axillary nodes normal.  Lab Results  Component Value Date   HGBA1C 5.4 01/26/2017   HGBA1C 5.1 04/18/2014    Lab Results  Component Value Date   CREATININE 1.17 01/06/2018   CREATININE 1.24 08/12/2017   CREATININE 1.09 01/26/2017    Lab Results  Component Value Date   WBC 3.6 (L) 01/06/2018   HGB 14.4 01/06/2018   HCT 42.5 01/06/2018   PLT 86.0 (L) 01/06/2018   GLUCOSE 106 (H) 01/06/2018   CHOL 85 01/06/2018   TRIG 99.0 01/06/2018   HDL 38.50 (L) 01/06/2018   LDLDIRECT 45.0 09/11/2015   LDLCALC 26 01/06/2018   ALT 31 01/06/2018   AST 20 01/06/2018   NA 141 01/06/2018   K 4.4 01/06/2018   CL 103 01/06/2018   CREATININE 1.17 01/06/2018   BUN 23 01/06/2018   CO2 32 01/06/2018   TSH 2.43 09/11/2015   INR 2.5 12/21/2017   HGBA1C 5.4 01/26/2017     Assessment & Plan:   Problem List Items Addressed This Visit    Hyperlipidemia   Relevant Orders   Lipid panel (Completed)   Comprehensive metabolic panel (Completed)   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  No BM biopsy done unless they are < 50 K.   Lab Results    Component Value Date   WBC 3.6 (L) 01/06/2018   HGB 14.4 01/06/2018   HCT 42.5 01/06/2018   MCV 98.1 01/06/2018   PLT 86.0 (L) 01/06/2018         Relevant Orders   CBC with Differential/Platelet (Completed)   RESOLVED: Medial epicondyle apophysitis due to overuse   Relevant Medications   HYDROcodone-acetaminophen (NORCO/VICODIN) 5-325 MG tablet   Localized pain of elbow joint    Left medial, secondary to fall in December.  Films done due to persistnet pain and were negatve      Impaired fasting glucose    A1c is normal.  Will check annually  Lab Results  Component Value Date   HGBA1C 5.4 01/26/2017         Essential hypertension  Well controlled on current regimen. Renal function stable, no changes today.  Lab Results  Component Value Date   CREATININE 1.17 01/06/2018   Lab Results  Component Value Date   NA 141 01/06/2018   K 4.4 01/06/2018   CL 103 01/06/2018   CO2 32 01/06/2018   No results found for: MICROALBUR, [id number]       Other Visit Diagnoses    Left elbow pain    -  Primary   Relevant Orders   DG Elbow Complete Left (Completed)      I have discontinued Shea T. Loree's trimethoprim-polymyxin b. I am also having him maintain his traMADol, allopurinol, metoprolol tartrate, warfarin, traZODone, atorvastatin, Artificial Tear Ointment (DRY EYES OP), furosemide, ALPRAZolam, and HYDROcodone-acetaminophen.  Meds ordered this encounter  Medications  . DISCONTD: HYDROcodone-acetaminophen (NORCO/VICODIN) 5-325 MG tablet    Sig: Take 1 tablet once daily as needed.    Dispense:  31 tablet    Refill:  0    M  . DISCONTD: HYDROcodone-acetaminophen (NORCO/VICODIN) 5-325 MG tablet    Sig: Take 1 tablet once daily as needed.    Dispense:  30 tablet    Refill:  0    May refill on or after February 06 2018  . HYDROcodone-acetaminophen (NORCO/VICODIN) 5-325 MG tablet    Sig: Take 1 tablet once daily as needed.    Dispense:  30 tablet    Refill:  0     May refill on or after March 08 2018    Medications Discontinued During This Encounter  Medication Reason  . trimethoprim-polymyxin b (POLYTRIM) ophthalmic solution Completed Course  . HYDROcodone-acetaminophen (NORCO/VICODIN) 5-325 MG tablet Reorder  . HYDROcodone-acetaminophen (NORCO/VICODIN) 5-325 MG tablet Reorder  . HYDROcodone-acetaminophen (NORCO/VICODIN) 5-325 MG tablet Reorder    Follow-up: Return in about 3 months (around 04/08/2018).   Crecencio Mc, MD

## 2018-01-06 NOTE — Patient Instructions (Addendum)
You may have chipped your elbow where the biceps tendon inserts .  If the x rays are confirming this,  I will refer you to Orthopedics.  If there  Is NO FRACTURE,  I will recommend seeing a Sports Medicine specialist to manage your recovery   You should ice the elbow for 15 minutes several times daily and suspend all wight lifting activities that involve using the biceps muscle for 4 weeks

## 2018-01-09 DIAGNOSIS — M25529 Pain in unspecified elbow: Secondary | ICD-10-CM | POA: Insufficient documentation

## 2018-01-09 DIAGNOSIS — M93929 Osteochondropathy, unspecified, unspecified upper arm: Secondary | ICD-10-CM | POA: Insufficient documentation

## 2018-01-09 NOTE — Assessment & Plan Note (Signed)
A1c is normal.  Will check annually  Lab Results  Component Value Date   HGBA1C 5.4 01/26/2017

## 2018-01-09 NOTE — Assessment & Plan Note (Signed)
Slight drop noted from baseline surveillance.  No BM biopsy done unless they are < 50 K.   Lab Results  Component Value Date   WBC 3.6 (L) 01/06/2018   HGB 14.4 01/06/2018   HCT 42.5 01/06/2018   MCV 98.1 01/06/2018   PLT 86.0 (L) 01/06/2018

## 2018-01-09 NOTE — Assessment & Plan Note (Signed)
Well controlled on current regimen. Renal function stable, no changes today.  Lab Results  Component Value Date   CREATININE 1.17 01/06/2018   Lab Results  Component Value Date   NA 141 01/06/2018   K 4.4 01/06/2018   CL 103 01/06/2018   CO2 32 01/06/2018   No results found for: Derl Barrow

## 2018-01-09 NOTE — Assessment & Plan Note (Addendum)
Left medial, secondary to blunt trauma during all in December.  Films done due to persistnet pain and were negaitve for fractires but noted bone spurring . He may have tendonitis of biceps had  Referral to sports medicie advised

## 2018-01-12 ENCOUNTER — Telehealth: Payer: Self-pay | Admitting: Internal Medicine

## 2018-01-12 NOTE — Telephone Encounter (Signed)
Copied from [place] [phone]. Topic: General - Other >> Jan 12, 2018  3:04 PM Yvette Rack wrote: Reason for CRM: patient calling about lab results

## 2018-01-13 NOTE — Telephone Encounter (Signed)
See lab result note.  Patient given results from 01/06/2018 labs

## 2018-01-15 ENCOUNTER — Other Ambulatory Visit: Payer: Self-pay | Admitting: Internal Medicine

## 2018-01-29 ENCOUNTER — Other Ambulatory Visit: Payer: Self-pay | Admitting: Internal Medicine

## 2018-02-03 ENCOUNTER — Other Ambulatory Visit: Payer: Self-pay | Admitting: Internal Medicine

## 2018-02-08 DIAGNOSIS — M25561 Pain in right knee: Secondary | ICD-10-CM | POA: Diagnosis not present

## 2018-02-09 ENCOUNTER — Other Ambulatory Visit: Payer: Self-pay | Admitting: Internal Medicine

## 2018-02-24 ENCOUNTER — Ambulatory Visit (INDEPENDENT_AMBULATORY_CARE_PROVIDER_SITE_OTHER): Payer: Medicare Other

## 2018-02-24 DIAGNOSIS — I481 Persistent atrial fibrillation: Secondary | ICD-10-CM | POA: Diagnosis not present

## 2018-02-24 DIAGNOSIS — Z5181 Encounter for therapeutic drug level monitoring: Secondary | ICD-10-CM

## 2018-02-24 DIAGNOSIS — I4819 Other persistent atrial fibrillation: Secondary | ICD-10-CM

## 2018-02-24 LAB — POCT INR: INR: 2.7 (ref 2.0–3.0)

## 2018-02-24 NOTE — Patient Instructions (Signed)
Please continue taking 1 tablet daily.   Recheck in 6 weeks.  

## 2018-03-23 ENCOUNTER — Other Ambulatory Visit: Payer: Self-pay

## 2018-03-23 MED ORDER — ALPRAZOLAM 0.25 MG PO TABS: 0.2500 mg | ORAL_TABLET | Freq: Every day | ORAL | 5 refills | Status: DC

## 2018-03-23 NOTE — Telephone Encounter (Signed)
Refilled: 10/30/2017 Last OV: 01/06/2018 Next OV: 04/08/2018

## 2018-03-23 NOTE — Telephone Encounter (Signed)
Printed, signed and faxed.  

## 2018-03-30 ENCOUNTER — Telehealth: Payer: Self-pay

## 2018-03-30 NOTE — Telephone Encounter (Signed)
Copied from [place] [id number]. Topic: Inquiry >> Mar 30, 2018 11:20 AM Pricilla Handler wrote: Reason for CRM: Patient called requesting a 10 day supply of both of the following Medications: ALPRAZolam (XANAX) 0.25 MG tablet and TraZODone (DESYREL) 150 MG tablet. Patient stated that he ordereed these medications through [facility], however it will take 7 to 10 Business days to receive the medications. Patient wants the temporary supply of both medications sent to his local pharmacy [facility], [place] [phone] (Phone)  [phone] (Fax), until he receives the medication from [employer].       Thank You!!!

## 2018-03-31 ENCOUNTER — Other Ambulatory Visit: Payer: Self-pay

## 2018-03-31 MED ORDER — TRAZODONE HCL 150 MG PO TABS: 150.0000 mg | ORAL_TABLET | Freq: Every day | ORAL | 0 refills | Status: DC

## 2018-03-31 MED ORDER — TRAZODONE HCL 150 MG PO TABS: 150.0000 mg | ORAL_TABLET | Freq: Every day | ORAL | 1 refills | Status: DC

## 2018-03-31 NOTE — Telephone Encounter (Signed)
Spoke with pt to let him know that the alprazolam can not be sent to mail order pharmacy and that we have already sent the rx to [facility] on 03/23/2018 for a 6 month supply. Also let pt know that we sent in a 30 day supply of the trazadone to [facility] until he receives his rx from mail order.

## 2018-04-07 ENCOUNTER — Telehealth: Payer: Self-pay | Admitting: Internal Medicine

## 2018-04-07 NOTE — Telephone Encounter (Signed)
Patient called, left VM to call the pharmacy for refill request as it is noted in the encounter 03/30/18 that these medications were sent to [facility]. I called [facility] and spoke to Manuela Schwartz, [facility] who says the Xanax Rx received on 03/23/18 was placed on hold because the patient received a refill on an old prescription and it is too early to refill. She says the Trazadone shows it was filled by another pharmacy on 03/31/18 and she will call to see if she can get it approved and reach out to the patient.

## 2018-04-07 NOTE — Telephone Encounter (Signed)
Copied from [place] [phone]. Topic: Inquiry >> Mar 30, 2018 11:20 AM Pricilla Handler wrote: Reason for CRM: Patient called requesting a 10 day supply of both of the following Medications: ALPRAZolam (XANAX) 0.25 MG tablet and TraZODone (DESYREL) 150 MG tablet. Patient stated that he ordereed these medications through [facility], however it will take 7 to 10 Business days to receive the medications. Patient wants the temporary supply of both medications sent to his local pharmacy [place], [place] [phone] (Phone)  [phone] (Fax), until he receives the medication from [employer].       Thank You!!!

## 2018-04-08 ENCOUNTER — Ambulatory Visit: Payer: Medicare Other | Admitting: Internal Medicine

## 2018-04-08 DIAGNOSIS — Z0289 Encounter for other administrative examinations: Secondary | ICD-10-CM

## 2018-04-12 ENCOUNTER — Other Ambulatory Visit: Payer: Self-pay

## 2018-04-12 ENCOUNTER — Ambulatory Visit (INDEPENDENT_AMBULATORY_CARE_PROVIDER_SITE_OTHER): Payer: Medicare Other

## 2018-04-12 DIAGNOSIS — Z5181 Encounter for therapeutic drug level monitoring: Secondary | ICD-10-CM | POA: Diagnosis not present

## 2018-04-12 DIAGNOSIS — I4819 Other persistent atrial fibrillation: Secondary | ICD-10-CM

## 2018-04-12 DIAGNOSIS — I481 Persistent atrial fibrillation: Secondary | ICD-10-CM | POA: Diagnosis not present

## 2018-04-12 LAB — POCT INR: INR: 2.8 (ref 2.0–3.0)

## 2018-04-12 NOTE — Patient Instructions (Signed)
Please continue taking 1 tablet daily.   Recheck in 6 weeks.  

## 2018-04-14 ENCOUNTER — Other Ambulatory Visit: Payer: Self-pay | Admitting: Internal Medicine

## 2018-04-14 NOTE — Telephone Encounter (Signed)
Rx refill request: tramadol 50 mg      Last filled: 05/26/16  LOV: 12/27/17  PCP: [place]: verified

## 2018-04-14 NOTE — Telephone Encounter (Signed)
Copied from [place] [id number]. Topic: Quick Communication - Rx Refill/Question >> Apr 14, 2018  3:55 PM Reyne Dumas L wrote: Medication: traMADol (ULTRAM) 50 MG tablet  Has the patient contacted their pharmacy? Yes - states new script needed.  Pt states he stopped the hydrocodone all together and is only taking Tramadol at this time. (Agent: If no, request that the patient contact the pharmacy for the refill.) (Agent: If yes, when and what did the pharmacy advise?)  Preferred Pharmacy (with phone number or street name): [id number], [place] [phone] (Phone) [phone] (Fax)  Agent: Please be advised that RX refills may take up to 3 business days. We ask that you follow-up with your pharmacy.

## 2018-04-16 ENCOUNTER — Other Ambulatory Visit: Payer: Self-pay

## 2018-04-16 ENCOUNTER — Emergency Department: Payer: Medicare Other

## 2018-04-16 ENCOUNTER — Emergency Department
Admission: EM | Admit: 2018-04-16 | Discharge: 2018-04-16 | Disposition: A | Discharge status: Home / Self Care | Payer: Medicare Other | Attending: Emergency Medicine | Admitting: Emergency Medicine

## 2018-04-16 DIAGNOSIS — E785 Hyperlipidemia, unspecified: Secondary | ICD-10-CM | POA: Insufficient documentation

## 2018-04-16 DIAGNOSIS — Z79899 Other long term (current) drug therapy: Secondary | ICD-10-CM | POA: Diagnosis not present

## 2018-04-16 DIAGNOSIS — Z7901 Long term (current) use of anticoagulants: Secondary | ICD-10-CM | POA: Insufficient documentation

## 2018-04-16 DIAGNOSIS — S0181XA Laceration without foreign body of other part of head, initial encounter: Secondary | ICD-10-CM | POA: Diagnosis not present

## 2018-04-16 DIAGNOSIS — Y9289 Other specified places as the place of occurrence of the external cause: Secondary | ICD-10-CM | POA: Insufficient documentation

## 2018-04-16 DIAGNOSIS — S0121XA Laceration without foreign body of nose, initial encounter: Secondary | ICD-10-CM | POA: Diagnosis not present

## 2018-04-16 DIAGNOSIS — I5032 Chronic diastolic (congestive) heart failure: Secondary | ICD-10-CM | POA: Insufficient documentation

## 2018-04-16 DIAGNOSIS — I2510 Atherosclerotic heart disease of native coronary artery without angina pectoris: Secondary | ICD-10-CM | POA: Insufficient documentation

## 2018-04-16 DIAGNOSIS — S01111A Laceration without foreign body of right eyelid and periocular area, initial encounter: Secondary | ICD-10-CM | POA: Diagnosis not present

## 2018-04-16 DIAGNOSIS — W19XXXA Unspecified fall, initial encounter: Secondary | ICD-10-CM

## 2018-04-16 DIAGNOSIS — Y9389 Activity, other specified: Secondary | ICD-10-CM | POA: Diagnosis not present

## 2018-04-16 DIAGNOSIS — Y998 Other external cause status: Secondary | ICD-10-CM | POA: Insufficient documentation

## 2018-04-16 DIAGNOSIS — I110 Hypertensive heart disease with heart failure: Secondary | ICD-10-CM | POA: Insufficient documentation

## 2018-04-16 DIAGNOSIS — I482 Chronic atrial fibrillation: Secondary | ICD-10-CM | POA: Insufficient documentation

## 2018-04-16 DIAGNOSIS — S0083XA Contusion of other part of head, initial encounter: Secondary | ICD-10-CM | POA: Diagnosis not present

## 2018-04-16 DIAGNOSIS — S0990XA Unspecified injury of head, initial encounter: Secondary | ICD-10-CM | POA: Insufficient documentation

## 2018-04-16 DIAGNOSIS — Z23 Encounter for immunization: Secondary | ICD-10-CM | POA: Diagnosis not present

## 2018-04-16 DIAGNOSIS — W01198A Fall on same level from slipping, tripping and stumbling with subsequent striking against other object, initial encounter: Secondary | ICD-10-CM | POA: Insufficient documentation

## 2018-04-16 DIAGNOSIS — S199XXA Unspecified injury of neck, initial encounter: Secondary | ICD-10-CM | POA: Diagnosis not present

## 2018-04-16 LAB — CBC WITH DIFFERENTIAL/PLATELET
Basophils Absolute: 0 10*3/uL (ref 0–0.1)
Basophils Relative: 1 %
Eosinophils Absolute: 0.1 10*3/uL (ref 0–0.7)
Eosinophils Relative: 2 %
HCT: 38.7 % — ABNORMAL LOW (ref 40.0–52.0)
Hemoglobin: 13.4 g/dL (ref 13.0–18.0)
Lymphocytes Relative: 13 %
Lymphs Abs: 0.5 10*3/uL — ABNORMAL LOW (ref 1.0–3.6)
MCH: 33.9 pg (ref 26.0–34.0)
MCHC: 34.6 g/dL (ref 32.0–36.0)
MCV: 98 fL (ref 80.0–100.0)
Monocytes Absolute: 0.5 10*3/uL (ref 0.2–1.0)
Monocytes Relative: 14 %
Neutro Abs: 2.7 10*3/uL (ref 1.4–6.5)
Neutrophils Relative %: 70 %
Platelets: 83 10*3/uL — ABNORMAL LOW (ref 150–440)
RBC: 3.95 MIL/uL — ABNORMAL LOW (ref 4.40–5.90)
RDW: 15.6 % — ABNORMAL HIGH (ref 11.5–14.5)
WBC: 3.8 10*3/uL (ref 3.8–10.6)

## 2018-04-16 LAB — BASIC METABOLIC PANEL
Anion gap: 5 (ref 5–15)
BUN: 25 mg/dL — ABNORMAL HIGH (ref 8–23)
CO2: 26 mmol/L (ref 22–32)
Calcium: 8.7 mg/dL — ABNORMAL LOW (ref 8.9–10.3)
Chloride: 108 mmol/L (ref 98–111)
Creatinine, Ser: 1.04 mg/dL (ref 0.61–1.24)
GFR calc Af Amer: >60 mL/min (ref >60)
GFR calc non Af Amer: >60 mL/min (ref >60)
Glucose, Bld: 110 mg/dL — ABNORMAL HIGH (ref 70–99)
Potassium: 4.3 mmol/L (ref 3.5–5.1)
Sodium: 139 mmol/L (ref 135–145)

## 2018-04-16 LAB — PROTIME-INR
INR: 1.74
Prothrombin Time: 20.2 seconds — ABNORMAL HIGH (ref 11.4–15.2)

## 2018-04-16 MED ORDER — TETANUS-DIPHTH-ACELL PERTUSSIS 5-2.5-18.5 LF-MCG/0.5 IM SUSP
0.5000 mL | Freq: Once | INTRAMUSCULAR | Status: AC
  Administered 2018-04-16: 0.5 mL via INTRAMUSCULAR
  Filled 2018-04-16: qty 0.5

## 2018-04-16 MED ORDER — LIDOCAINE-EPINEPHRINE 1 %-1:100000 IJ SOLN
10.0000 mL | Freq: Once | INTRAMUSCULAR | Status: AC
  Administered 2018-04-16: 10 mL via INTRADERMAL
  Filled 2018-04-16: qty 10

## 2018-04-16 NOTE — ED Provider Notes (Signed)
[facility] Emergency Department Provider Note  ____________________________________________   First MD Initiated Contact with Patient 04/16/18 1224     (approximate)  I have reviewed the triage vital signs and the nursing notes.   HISTORY  Chief Complaint Fall   HPI Jeffery Reynolds is a 84 y.o. male comes the emergency department via EMS from [employer] after sustaining a fall while working out.  He says he was about to start his workout when he was moving a bench, tripped forward, and fell striking his face.  He denies loss of consciousness.  He does take Coumadin.  He sustained a laceration above his right brow into his nose.  He denies neck pain.  He denies chest pain shortness of breath abdominal pain nausea or vomiting.  He denies double vision or blurred vision.  His symptoms came on suddenly were severe have slowly improved with time.  His tetanus is not current.   Past Medical History:  Diagnosis Date  . Arthritis   . CAD (coronary artery disease)    a. s/p 6-V CABG in [place] in 1995; b. MV 4/12: mild intensity, fixed perfusion defect involving the basal to mid anterior wall consistent with injury to the diagonal distribution (this finding was also noted on prior study dating back to 2008). Overall, no ischemia was noted and there was no high-risk prognostic indicators based on this study. EF 63%  . Carotid artery disease ([place])    a. ultrasound 12/16: 1-39% bilateral ICA stenosis  . Chronic atrial fibrillation ([place]) 2000   a. CHADS2VASc => 4 (HTN, age x 2, vascular disease); b. on Coumadin [facility]  . Chronic back pain   . Cognitive impairment   . Colon polyps   . Diastolic dysfunction    a. TTE 4/10: EF of 76%, mild diastolic dysfunction, no significant valvular abnormalities, mild ascending aortic root dilatation, moderate left atrial enlargement, technically difficult study  . Genital warts   . GERD (gastroesophageal  reflux disease)   . Gout    Controlled with Allopurinol  . History of blood transfusion   . History of chicken pox   . Hyperlipidemia   . Hypertension   . Seasonal allergies   . Sleep apnea, obstructive   . 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 Localized pain of elbow joint 01/09/2018  . Impaired fasting glucose 10/19/2016  . Frequent nosebleeds 10/18/2016  . Urinary hesitancy 10/18/2016  . Osteoarthritis of carpometacarpal [facility]) joint of right thumb 08/02/2016  . Preoperative general physical examination 01/31/2016  . Venous stasis of both lower extremities 01/31/2016  . History of cerebrovascular disease 07/31/2015  . Encounter for preventive health examination 07/31/2015  . Dysplastic nevus of neck 06/13/2015  . Obesity 06/13/2015  . 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 Chronic atrial fibrillation ([place]) 03/16/2015  . Left shoulder pain 11/03/2014  . Obstructive sleep apnea 09/14/2014  . Thrombocytopenia ([place]) 08/17/2014  . Constipation 08/15/2014  . Medicare annual wellness visit, subsequent 08/15/2014  . Tubular adenoma of colon 08/14/2014  . Chronic pain of left knee 06/17/2014  . Chronic dryness of both eyes 06/14/2014  . S/P left unicompartmental knee replacement 06/02/2014  . Seasonal allergies 12/18/2013  . Arthritis 12/18/2013  . H/O: gout 12/18/2013  . Encounter for therapeutic drug monitoring 12/14/2013  . Coronary artery disease   . Hyperlipidemia   . Essential hypertension     Past  Surgical History:  Procedure Laterality Date  . CARDIAC CATHETERIZATION    . CORONARY ARTERY BYPASS GRAFT  1995   CABG x 6 in [place], [place]    . GYNECOMASTIA EXCISION    . KNEE SURGERY     bilateral   . ROTATOR CUFF REPAIR     right  . TONSILLECTOMY AND ADENOIDECTOMY  1943    Prior to Admission medications   Medication Sig Start Date End Date Taking? Authorizing Provider    allopurinol (ZYLOPRIM) 300 MG tablet TAKE 1 TABLET ON MONDAY, [facility] AND FRIDAY 02/03/18   Crecencio Mc, MD  ALPRAZolam Duanne Moron) 0.25 MG tablet Take 1 tablet (0.25 mg total) by mouth at bedtime. 03/23/18   Crecencio Mc, MD  Artificial Tear Ointment (DRY EYES OP) Apply 1 drop 3 (three) times daily to eye.    [provider]  atorvastatin (LIPITOR) 40 MG tablet TAKE 1 TABLET EVERY DAY 02/03/18   Crecencio Mc, MD  furosemide (LASIX) 20 MG tablet One tablet every other day for fluid retention 10/14/17   Crecencio Mc, MD  HYDROcodone-acetaminophen (NORCO/VICODIN) 5-325 MG tablet Take 1 tablet once daily as needed. 01/06/18   Crecencio Mc, MD  metoprolol tartrate (LOPRESSOR) 50 MG tablet TAKE 1 TABLET(50 MG) BY MOUTH TWICE DAILY 02/03/18   Crecencio Mc, MD  traMADol (ULTRAM) 50 MG tablet Take 1 tablet (50 mg total) by mouth every 6 (six) hours as needed. Maximum 3 daily 05/26/16   Crecencio Mc, MD  traZODone (DESYREL) 150 MG tablet Take 1 tablet (150 mg total) by mouth at bedtime. 03/31/18   Crecencio Mc, MD  warfarin (COUMADIN) 5 MG tablet TAKE 1 TABLET BY MOUTH DAILY OR AS DIRECTED BY COUMADIN CLINIC 02/03/18   Crecencio Mc, MD    Allergies Patient has no known allergies.  Family History  Problem Relation Age of Onset  . Heart disease Father   . Heart attack Father 48       MI  . Alcohol abuse Father   . Hyperlipidemia Father   . Hypertension Father   . Heart attack Paternal Grandfather 26  . Alcohol abuse Paternal Grandfather   . Cancer Mother        Breast cancer and Lung Cancer  . Diabetes Brother   . Dementia Brother   . Crohn's disease Daughter   . Cancer Brother        lung cancer  . Alcohol abuse Son   . Alcohol abuse Maternal Grandfather   . Alcohol abuse Son   . Heart attack Paternal Grandmother 73    Social History Social History   Tobacco Use  . Smoking status: Never Smoker  . Smokeless tobacco: Never Used  Substance Use Topics  .  Alcohol use: Yes    Comment: occasional   . Drug use: No    Review of Systems Constitutional: No fever/chills Eyes: No visual changes. ENT: No sore throat. Cardiovascular: Denies chest pain. Respiratory: Denies shortness of breath. Gastrointestinal: No abdominal pain.  No nausea, no vomiting.   Genitourinary: Negative for dysuria. Musculoskeletal: Negative for back pain. Skin: Positive for wound Neurological: Positive for headache   ____________________________________________   PHYSICAL EXAM:  VITAL SIGNS: ED Triage Vitals  Enc Vitals Group     BP      Pulse      Resp      Temp      Temp src  SpO2      Weight      Height      Head Circumference      Peak Flow      Pain Score      Pain Loc      Pain Edu?      Excl. in [place]?     Constitutional: Alert and oriented x4 appears somewhat uncomfortable although nontoxic no diaphoresis Eyes: PERRL EOMI. nipples are midrange and brisk Head: 5 cm laceration to right brow.  Roughly 3 cm circular avulsion to the bridge of his nose. Nose: No congestion/rhinnorhea.  No septal hematoma Mouth/Throat: No trismus Neck: No stridor.  No midline tenderness or step-offs Cardiovascular: Normal rate, regular rhythm. Grossly normal heart sounds.  Good peripheral circulation. Respiratory: Normal respiratory effort.  No retractions. Lungs CTAB and moving good air Gastrointestinal: Soft nontender Musculoskeletal: No lower extremity edema   Neurologic:  Normal speech and language. No gross focal neurologic deficits are appreciated. Skin: Wounds as above Psychiatric: Mood and affect are normal. Speech and behavior are normal.    ____________________________________________   DIFFERENTIAL includes but not limited to  Intracerebral hemorrhage, cervical spine fracture, central cord syndrome, laceration, facial fracture ____________________________________________   LABS (all labs ordered are listed, but only abnormal results are  displayed)  Labs Reviewed  PROTIME-INR - Abnormal; Notable for the following components:      Result Value   Prothrombin Time 20.2 (*)    All other components within normal limits  BASIC METABOLIC PANEL - Abnormal; Notable for the following components:   Glucose, Bld 110 (*)    BUN 25 (*)    Calcium 8.7 (*)    All other components within normal limits  CBC WITH DIFFERENTIAL/PLATELET - Abnormal; Notable for the following components:   RBC 3.95 (*)    HCT 38.7 (*)    RDW 15.6 (*)    Platelets 83 (*)    Lymphs Abs 0.5 (*)    All other components within normal limits    Lab work reviewed by me shows the patient is slightly subtherapeutic on his Coumadin although not significantly so __________________________________________  EKG   ____________________________________________  RADIOLOGY  CT scan of the head neck reviewed by me with no acute disease ____________________________________________   PROCEDURES  Procedure(s) performed: Yes  .Marland KitchenLaceration Repair Date/Time: 04/16/2018 1:30 PM Performed by: Darel Hong, MD Authorized by: Darel Hong, MD   Consent:    Consent obtained:  Verbal   Consent given by:  Patient   Risks discussed:  Infection, pain, retained foreign body, poor cosmetic result and poor wound healing Anesthesia (see MAR for exact dosages):    Anesthesia method:  Local infiltration   Local anesthetic:  Lidocaine 1% WITH epi Laceration details:    Location:  Face   Face location:  R eyebrow   Length (cm):  5 Repair type:    Repair type:  Complex Pre-procedure details:    Preparation:  Patient was prepped and draped in usual sterile fashion and imaging obtained to evaluate for foreign bodies Exploration:    Limited defect created (wound extended): yes     Hemostasis achieved with:  Direct pressure and epinephrine   Wound exploration: entire depth of wound probed and visualized     Wound extent: areolar tissue violated, fascia violated and  muscle damage     Wound extent: no foreign bodies/material noted, no nerve damage noted, no tendon damage noted, no underlying fracture noted and no vascular damage noted  Contaminated: no   Treatment:    Area cleansed with:  Saline   Amount of cleaning:  Extensive   Irrigation solution:  Sterile saline   Irrigation method:  Pressure wash   Visualized foreign bodies/material removed: no     Debridement:  Minimal   Undermining:  Minimal   Scar revision: no   Subcutaneous repair:    Suture size:  5-0   Suture material:  Vicryl   Number of sutures:  2 Skin repair:    Repair method:  Sutures   Suture size:  5-0   Wound skin closure material used: vicryl.   Suture technique:  Simple interrupted   Number of sutures:  5 Approximation:    Approximation:  Close Post-procedure details:    Dressing:  Sterile dressing   Patient tolerance of procedure:  Tolerated well, no immediate complications    Critical Care performed: no  ____________________________________________   INITIAL IMPRESSION / ASSESSMENT AND PLAN / ED COURSE  Pertinent labs & imaging results that were available during my care of the patient were reviewed by me and considered in my medical decision making (see chart for details).   As part of my medical decision making, I reviewed the following data within the [place] History obtained from family if available, nursing notes, old chart and ekg, as well as notes from prior ED visits.  The patient arrives after a mechanical fall sustaining obvious facial trauma.  Given his advanced age and anticoagulation neuroimaging obtained which fortunately is negative.  He is slightly subtherapeutic on his INR although he has no mechanical valve so this is not particularly significant.  Wounds anesthetized and washed out with copious normal saline.  The wound on his eyebrow was closed in 2 layers after undermining and having to trim the edges and revise the wound.   The wound on his nose was cleaned out copiously and I infused lidocaine with epinephrine to help with hemostasis.  Unable to actually close this wound as it was an avulsion and the patient understands will have to close secondary to secondary intent and will scar.  He is discharged home with his son in improved condition.      ____________________________________________   FINAL CLINICAL IMPRESSION(S) / ED DIAGNOSES  Final diagnoses:  Fall, initial encounter  Contusion of face, initial encounter  Facial laceration, initial encounter      NEW MEDICATIONS STARTED DURING THIS VISIT:  Discharge Medication List as of 04/16/2018  2:21 PM       Note:  This document was prepared using Dragon voice recognition software and may include unintentional dictation errors.     Darel Hong, MD 04/18/18 1549

## 2018-04-16 NOTE — Discharge Instructions (Signed)
Nightly today your blood work and your CT scans were reassuring.  Please keep your wound clean and dry and follow-up with your primary care physician as needed.  Return to the emergency department for any concerns.  It was a pleasure to take care of you today, and thank you for coming to our emergency department.  If you have any questions or concerns before leaving please ask the nurse to grab me and I'm more than happy to go through your aftercare instructions again.  If you were prescribed any opioid pain medication today such as Norco, Vicodin, Percocet, morphine, hydrocodone, or oxycodone please make sure you do not drive when you are taking this medication as it can alter your ability to drive safely.  If you have any concerns once you are home that you are not improving or are in fact getting worse before you can make it to your follow-up appointment, please do not hesitate to call 911 and come back for further evaluation.  Darel Hong, MD  Results for orders placed or performed during the hospital encounter of 04/16/18  Protime-INR  Result Value Ref Range   Prothrombin Time 20.2 (H) 11.4 - 15.2 seconds   INR 3.14   Basic metabolic panel  Result Value Ref Range   Sodium 139 135 - 145 mmol/L   Potassium 4.3 3.5 - 5.1 mmol/L   Chloride 108 98 - 111 mmol/L   CO2 26 22 - 32 mmol/L   Glucose, Bld 110 (H) 70 - 99 mg/dL   BUN 25 (H) 8 - 23 mg/dL   Creatinine, Ser 1.04 0.61 - 1.24 mg/dL   Calcium 8.7 (L) 8.9 - 10.3 mg/dL   GFR calc non Af Amer >60 >60 mL/min   GFR calc Af Amer >60 >60 mL/min   Anion gap 5 5 - 15  CBC with Differential  Result Value Ref Range   WBC 3.8 3.8 - 10.6 K/uL   RBC 3.95 (L) 4.40 - 5.90 MIL/uL   Hemoglobin 13.4 13.0 - 18.0 g/dL   HCT 38.7 (L) 40.0 - 52.0 %   MCV 98.0 80.0 - 100.0 fL   MCH 33.9 26.0 - 34.0 pg   MCHC 34.6 32.0 - 36.0 g/dL   RDW 15.6 (H) 11.5 - 14.5 %   Platelets 83 (L) 150 - 440 K/uL   Neutrophils Relative % 70 %   Neutro Abs 2.7 1.4 -  6.5 K/uL   Lymphocytes Relative 13 %   Lymphs Abs 0.5 (L) 1.0 - 3.6 K/uL   Monocytes Relative 14 %   Monocytes Absolute 0.5 0.2 - 1.0 K/uL   Eosinophils Relative 2 %   Eosinophils Absolute 0.1 0 - 0.7 K/uL   Basophils Relative 1 %   Basophils Absolute 0.0 0 - 0.1 K/uL   Ct Head Wo Contrast  Result Date: 04/16/2018 CLINICAL DATA:  Status post trip and fall today with a blow to the face. Initial encounter. EXAM: CT HEAD WITHOUT CONTRAST CT CERVICAL SPINE WITHOUT CONTRAST TECHNIQUE: Multidetector CT imaging of the head and cervical spine was performed following the standard protocol without intravenous contrast. Multiplanar CT image reconstructions of the cervical spine were also generated. COMPARISON:  Brain MRI 06/25/2017. FINDINGS: CT HEAD FINDINGS Brain: No evidence of acute infarction, hemorrhage, hydrocephalus, extra-axial collection or mass lesion/mass effect. Cortical atrophy and mild chronic microvascular ischemic change are noted. Vascular: No hyperdense vessel or unexpected calcification. Skull: Normal. Negative for fracture or focal lesion. Sinuses/Orbits: Negative. Other: Soft tissue contusion/laceration about the  right side of the nose and eye noted. CT CERVICAL SPINE FINDINGS Alignment: Maintained with straightening of lordosis noted. Skull base and vertebrae: No acute fracture. No primary bone lesion or focal pathologic process. Soft tissues and spinal canal: No prevertebral fluid or swelling. No visible canal hematoma. Disc levels: Loss of disc space height and endplate spurring appear worst at C5-6 and C6-7. Scattered facet degenerative change also noted. Upper chest: Negative. Other: None. IMPRESSION: Laceration on the right side of the nose near the right eye without underlying fracture or acute intracranial abnormality. No acute abnormality cervical spine. Atrophy and mild chronic microvascular ischemic change. Cervical spondylosis. Electronically Signed   By: Inge Rise M.D.    On: 04/16/2018 13:25   Ct Cervical Spine Wo Contrast  Result Date: 04/16/2018 CLINICAL DATA:  Status post trip and fall today with a blow to the face. Initial encounter. EXAM: CT HEAD WITHOUT CONTRAST CT CERVICAL SPINE WITHOUT CONTRAST TECHNIQUE: Multidetector CT imaging of the head and cervical spine was performed following the standard protocol without intravenous contrast. Multiplanar CT image reconstructions of the cervical spine were also generated. COMPARISON:  Brain MRI 06/25/2017. FINDINGS: CT HEAD FINDINGS Brain: No evidence of acute infarction, hemorrhage, hydrocephalus, extra-axial collection or mass lesion/mass effect. Cortical atrophy and mild chronic microvascular ischemic change are noted. Vascular: No hyperdense vessel or unexpected calcification. Skull: Normal. Negative for fracture or focal lesion. Sinuses/Orbits: Negative. Other: Soft tissue contusion/laceration about the right side of the nose and eye noted. CT CERVICAL SPINE FINDINGS Alignment: Maintained with straightening of lordosis noted. Skull base and vertebrae: No acute fracture. No primary bone lesion or focal pathologic process. Soft tissues and spinal canal: No prevertebral fluid or swelling. No visible canal hematoma. Disc levels: Loss of disc space height and endplate spurring appear worst at C5-6 and C6-7. Scattered facet degenerative change also noted. Upper chest: Negative. Other: None. IMPRESSION: Laceration on the right side of the nose near the right eye without underlying fracture or acute intracranial abnormality. No acute abnormality cervical spine. Atrophy and mild chronic microvascular ischemic change. Cervical spondylosis. Electronically Signed   By: Inge Rise M.D.   On: 04/16/2018 13:25

## 2018-04-16 NOTE — ED Notes (Signed)
Pt to CT

## 2018-04-16 NOTE — ED Notes (Signed)
Pt ambulated to bathroom with assistance. Steady gait, NAD.

## 2018-04-16 NOTE — ED Triage Notes (Signed)
Pt comes via [facility] from [employer] with c/o fall. Pt states he was about to start his workout and was moving a bench and tripped and fell. Pt denies LOC. Pt has laceration noted to nose and above right eye. Pt is on blood thinners. Pt is A&OX4.

## 2018-04-16 NOTE — ED Notes (Signed)
MD at bedside. 

## 2018-04-19 ENCOUNTER — Telehealth: Payer: Self-pay | Admitting: Internal Medicine

## 2018-04-19 NOTE — Telephone Encounter (Signed)
Left message for patient to return call to office with these questions and recent ED visit he needs an appointment.

## 2018-04-19 NOTE — Telephone Encounter (Signed)
Copied from [place]. Topic: Quick Communication - See Telephone Encounter >> Apr 19, 2018  4:04 PM Bea Graff, NT wrote: CRM for notification. See Telephone encounter for: 04/19/18. Pt states he would like to talk with Dr. Derrel Nip on the phone about his medications and he also has a personal issue to discuss with her.

## 2018-04-21 NOTE — Telephone Encounter (Signed)
Patient anted to discuss personnel issue so schedule patient appointment.

## 2018-04-27 ENCOUNTER — Ambulatory Visit (INDEPENDENT_AMBULATORY_CARE_PROVIDER_SITE_OTHER): Payer: Medicare Other | Admitting: Internal Medicine

## 2018-04-27 ENCOUNTER — Encounter: Payer: Self-pay | Admitting: Internal Medicine

## 2018-04-27 VITALS — BP 118/70 | HR 76 | Temp 98.2°F | Resp 16 | Ht 68.0 in | Wt 211.8 lb

## 2018-04-27 DIAGNOSIS — H547 Unspecified visual loss: Secondary | ICD-10-CM

## 2018-04-27 DIAGNOSIS — M25562 Pain in left knee: Secondary | ICD-10-CM

## 2018-04-27 DIAGNOSIS — F5105 Insomnia due to other mental disorder: Secondary | ICD-10-CM | POA: Diagnosis not present

## 2018-04-27 DIAGNOSIS — H04123 Dry eye syndrome of bilateral lacrimal glands: Secondary | ICD-10-CM | POA: Diagnosis not present

## 2018-04-27 DIAGNOSIS — Z79899 Other long term (current) drug therapy: Secondary | ICD-10-CM

## 2018-04-27 DIAGNOSIS — G8929 Other chronic pain: Secondary | ICD-10-CM

## 2018-04-27 DIAGNOSIS — F409 Phobic anxiety disorder, unspecified: Secondary | ICD-10-CM | POA: Diagnosis not present

## 2018-04-27 DIAGNOSIS — I2510 Atherosclerotic heart disease of native coronary artery without angina pectoris: Secondary | ICD-10-CM

## 2018-04-27 MED ORDER — TRAMADOL HCL 50 MG PO TABS: 50.0000 mg | ORAL_TABLET | Freq: Four times a day (QID) | ORAL | 5 refills | Status: DC | PRN

## 2018-04-27 NOTE — Telephone Encounter (Signed)
Ok to refill 

## 2018-04-27 NOTE — Patient Instructions (Addendum)
We are going to reduce your alprazolam before stopping it completely,  as follows:  1/2 tablet at bedtime for 2 weeks,  Then stop  It completely  Continue trazodone for insomnia.   CONTINUE  METOPROLOL.  This controls  YOUR HEART RATE   For your pain :   You can take up to 2000 mg of acetominophen (tylenol) every day safely  In divided doses (500 mg every 6 hours  Or 1000 mg every 12 hours.)  SAVE THE TRAMADOL FOR MODERATE TO SEVERE PAIN

## 2018-04-27 NOTE — Progress Notes (Signed)
Subjective:  Patient ID: Jeffery Reynolds, male    DOB: 12-23-1933  Age: 84 y.o. MRN: [id number]  CC: The primary encounter diagnosis was Worsening vision. Diagnoses of Long-term use of high-risk medication, Chronic dryness of both eyes, Chronic pain of left knee, and Insomnia due to anxiety and fear were also pertinent to this visit.  HPI C[place] CORK presents for  Follow up on multiple issues  3 falls in the last year.  (December,  August 23  And today)  Feels his eye glass prescription isn't correct  despite 2 adjustments by Dr Matilde Sprang.   Denies loss of balance and leg weakness .  Still working out at the gym    Hypertension: patient checks blood pressure twice weekly at home.  Readings have been for the most part< 140/80 at rest . Patient is following a reduced salt diet most days and is taking medications as prescribed  Using tramadol for right knee and back pain .  Has stopped using vicodin  No longer tolerating alprazolam at night due to lethargy I n the am.  Taking trazodone   Outpatient Medications Prior to Visit  Medication Sig Dispense Refill  . allopurinol (ZYLOPRIM) 300 MG tablet TAKE 1 TABLET ON MONDAY, WEDNESDAY AND FRIDAY 39 tablet 2  . atorvastatin (LIPITOR) 40 MG tablet TAKE 1 TABLET EVERY DAY 90 tablet 1  . furosemide (LASIX) 20 MG tablet One tablet every other day for fluid retention 45 tablet 2  . metoprolol tartrate (LOPRESSOR) 50 MG tablet TAKE 1 TABLET(50 MG) BY MOUTH TWICE DAILY 180 tablet 1  . traZODone (DESYREL) 150 MG tablet Take 1 tablet (150 mg total) by mouth at bedtime. 90 tablet 1  . warfarin (COUMADIN) 5 MG tablet TAKE 1 TABLET BY MOUTH DAILY OR AS DIRECTED BY COUMADIN CLINIC 90 tablet 1  . ALPRAZolam (XANAX) 0.25 MG tablet Take 1 tablet (0.25 mg total) by mouth at bedtime. 30 tablet 5  . traMADol (ULTRAM) 50 MG tablet Take 1 tablet (50 mg total) by mouth every 6 (six) hours as needed. Maximum 3 daily 90 tablet 5  . Artificial Tear Ointment (DRY EYES OP)  Apply 1 drop 3 (three) times daily to eye.    Marland Kitchen HYDROcodone-acetaminophen (NORCO/VICODIN) 5-325 MG tablet Take 1 tablet once daily as needed. (Patient not taking: Reported on 04/27/2018) 30 tablet 0   No facility-administered medications prior to visit.     Review of Systems;  Patient denies headache, fevers, malaise, unintentional weight loss, skin rash, eye pain, sinus congestion and sinus pain, sore throat, dysphagia,  hemoptysis , cough, dyspnea, wheezing, chest pain, palpitations, orthopnea, edema, abdominal pain, nausea, melena, diarrhea, constipation, flank pain, dysuria, hematuria, urinary  Frequency, nocturia, numbness, tingling, seizures,  Focal weakness, Loss of consciousness,  Tremor, untreated  insomnia, depression, anxiety, and suicidal ideation.      Objective:  BP 118/70 (BP Location: Left Arm, Patient Position: Sitting, Cuff Size: Normal)   Pulse 76   Temp 98.2 F (36.8 C) (Oral)   Resp 16   Ht 5\' 8"  (1.727 m)   Wt 211 lb 12.8 oz (96.1 kg)   SpO2 97%   BMI 32.20 kg/m   BP Readings from Last 3 Encounters:  04/27/18 118/70  04/16/18 117/86  01/06/18 112/64    Wt Readings from Last 3 Encounters:  04/27/18 211 lb 12.8 oz (96.1 kg)  04/16/18 205 lb (93 kg)  01/06/18 207 lb 6.4 oz (94.1 kg)    General appearance: alert, cooperative and  appears stated age Ears: normal TM's and external ear canals both ears Throat: lips, mucosa, and tongue normal; teeth and gums normal Neck: no adenopathy, no carotid bruit, supple, symmetrical, trachea midline and thyroid not enlarged, symmetric, no tenderness/mass/nodules Back: symmetric, no curvature. ROM normal. No CVA tenderness. Lungs: clear to auscultation bilaterally Heart: regular rate and rhythm, S1, S2 normal, no murmur, click, rub or gallop Abdomen: soft, non-tender; bowel sounds normal; no masses,  no organomegaly Pulses: 2+ and symmetric Skin: Skin color, texture, turgor normal. No rashes or lesions Lymph nodes:  Cervical, supraclavicular, and axillary nodes normal.  Lab Results  Component Value Date   HGBA1C 5.4 01/26/2017   HGBA1C 5.1 04/18/2014    Lab Results  Component Value Date   CREATININE 1.21 04/27/2018   CREATININE 1.04 04/16/2018   CREATININE 1.17 01/06/2018    Lab Results  Component Value Date   WBC 3.8 04/16/2018   HGB 13.4 04/16/2018   HCT 38.7 (L) 04/16/2018   PLT 83 (L) 04/16/2018   GLUCOSE 100 (H) 04/27/2018   CHOL 85 01/06/2018   TRIG 99.0 01/06/2018   HDL 38.50 (L) 01/06/2018   LDLDIRECT 45.0 09/11/2015   LDLCALC 26 01/06/2018   ALT 31 04/27/2018   AST 21 04/27/2018   NA 141 04/27/2018   K 4.3 04/27/2018   CL 105 04/27/2018   CREATININE 1.21 04/27/2018   BUN 21 04/27/2018   CO2 29 04/27/2018   TSH 2.43 09/11/2015   INR 1.74 04/16/2018   HGBA1C 5.4 01/26/2017    Ct Head Wo Contrast  Result Date: 04/16/2018 CLINICAL DATA:  Status post trip and fall today with a blow to the face. Initial encounter. EXAM: CT HEAD WITHOUT CONTRAST CT CERVICAL SPINE WITHOUT CONTRAST TECHNIQUE: Multidetector CT imaging of the head and cervical spine was performed following the standard protocol without intravenous contrast. Multiplanar CT image reconstructions of the cervical spine were also generated. COMPARISON:  Brain MRI 06/25/2017. FINDINGS: CT HEAD FINDINGS Brain: No evidence of acute infarction, hemorrhage, hydrocephalus, extra-axial collection or mass lesion/mass effect. Cortical atrophy and mild chronic microvascular ischemic change are noted. Vascular: No hyperdense vessel or unexpected calcification. Skull: Normal. Negative for fracture or focal lesion. Sinuses/Orbits: Negative. Other: Soft tissue contusion/laceration about the right side of the nose and eye noted. CT CERVICAL SPINE FINDINGS Alignment: Maintained with straightening of lordosis noted. Skull base and vertebrae: No acute fracture. No primary bone lesion or focal pathologic process. Soft tissues and spinal canal:  No prevertebral fluid or swelling. No visible canal hematoma. Disc levels: Loss of disc space height and endplate spurring appear worst at C5-6 and C6-7. Scattered facet degenerative change also noted. Upper chest: Negative. Other: None. IMPRESSION: Laceration on the right side of the nose near the right eye without underlying fracture or acute intracranial abnormality. No acute abnormality cervical spine. Atrophy and mild chronic microvascular ischemic change. Cervical spondylosis. Electronically Signed   By: Inge Rise M.D.   On: 04/16/2018 13:25   Ct Cervical Spine Wo Contrast  Result Date: 04/16/2018 CLINICAL DATA:  Status post trip and fall today with a blow to the face. Initial encounter. EXAM: CT HEAD WITHOUT CONTRAST CT CERVICAL SPINE WITHOUT CONTRAST TECHNIQUE: Multidetector CT imaging of the head and cervical spine was performed following the standard protocol without intravenous contrast. Multiplanar CT image reconstructions of the cervical spine were also generated. COMPARISON:  Brain MRI 06/25/2017. FINDINGS: CT HEAD FINDINGS Brain: No evidence of acute infarction, hemorrhage, hydrocephalus, extra-axial collection or mass lesion/mass effect. Cortical  atrophy and mild chronic microvascular ischemic change are noted. Vascular: No hyperdense vessel or unexpected calcification. Skull: Normal. Negative for fracture or focal lesion. Sinuses/Orbits: Negative. Other: Soft tissue contusion/laceration about the right side of the nose and eye noted. CT CERVICAL SPINE FINDINGS Alignment: Maintained with straightening of lordosis noted. Skull base and vertebrae: No acute fracture. No primary bone lesion or focal pathologic process. Soft tissues and spinal canal: No prevertebral fluid or swelling. No visible canal hematoma. Disc levels: Loss of disc space height and endplate spurring appear worst at C5-6 and C6-7. Scattered facet degenerative change also noted. Upper chest: Negative. Other: None.  IMPRESSION: Laceration on the right side of the nose near the right eye without underlying fracture or acute intracranial abnormality. No acute abnormality cervical spine. Atrophy and mild chronic microvascular ischemic change. Cervical spondylosis. Electronically Signed   By: Inge Rise M.D.   On: 04/16/2018 13:25    Assessment & Plan:   Problem List Items Addressed This Visit    Chronic dryness of both eyes    May be affecting his vision.  Referring to [facility]       Chronic pain of left knee    He states that he is is managing his pain without narcotics,  has stopped using  Vicodin daily . Refill history confirmed via [place] Controlled Substance database, accessed by me today.Marland Kitchen His last refill of Vicodin was June 1,  Qty #30.  He has not refilled the tramadol since 2017 .  Advised to use tylenol 2000 mg daily and prn tramadol        Relevant Medications   traMADol (ULTRAM) 50 MG tablet   Insomnia due to anxiety and fear    Managed historically with trazodone and alprazolam.  Weaning  alprazolam  To off per today's discussion       Worsening vision - Primary    Suspect cataracts as cause of his worsening vision despite adjustment of lens.  Refer to [facility]       Relevant Orders   Ambulatory referral to Ophthalmology    Other Visit Diagnoses    Long-term use of high-risk medication       Relevant Orders   Comprehensive metabolic panel (Completed)     A total of 25 minutes of face to face time was spent with patient more than half of which was spent in counselling about the above mentioned conditions  and coordination of care   I have discontinued Adison T. Capelle's HYDROcodone-acetaminophen and ALPRAZolam. I am also having him maintain his Artificial Tear Ointment (DRY EYES OP), furosemide, atorvastatin, warfarin, metoprolol tartrate, allopurinol, traZODone, and traMADol.  Meds ordered this encounter  Medications  . traMADol (ULTRAM) 50 MG tablet    Sig: Take 1  tablet (50 mg total) by mouth every 6 (six) hours as needed. Maximum 3 daily    Dispense:  90 tablet    Refill:  5    Medications Discontinued During This Encounter  Medication Reason  . ALPRAZolam (XANAX) 0.25 MG tablet   . HYDROcodone-acetaminophen (NORCO/VICODIN) 5-325 MG tablet   . traMADol (ULTRAM) 50 MG tablet Reorder    Follow-up: No follow-ups on file.   Crecencio Mc, MD

## 2018-04-28 DIAGNOSIS — F409 Phobic anxiety disorder, unspecified: Secondary | ICD-10-CM | POA: Insufficient documentation

## 2018-04-28 DIAGNOSIS — F5105 Insomnia due to other mental disorder: Secondary | ICD-10-CM

## 2018-04-28 DIAGNOSIS — H547 Unspecified visual loss: Secondary | ICD-10-CM | POA: Insufficient documentation

## 2018-04-28 LAB — COMPREHENSIVE METABOLIC PANEL
ALT: 31 U/L (ref 0–53)
AST: 21 U/L (ref 0–37)
Albumin: 4.1 g/dL (ref 3.5–5.2)
Alkaline Phosphatase: 84 U/L (ref 39–117)
BUN: 21 mg/dL (ref 6–23)
CO2: 29 mEq/L (ref 19–32)
Calcium: 9.3 mg/dL (ref 8.4–10.5)
Chloride: 105 mEq/L (ref 96–112)
Creatinine, Ser: 1.21 mg/dL (ref 0.40–1.50)
GFR: 60.92 mL/min (ref >60.00)
Glucose, Bld: 100 mg/dL — ABNORMAL HIGH (ref 70–99)
Potassium: 4.3 mEq/L (ref 3.5–5.1)
Sodium: 141 mEq/L (ref 135–145)
Total Bilirubin: 0.9 mg/dL (ref 0.2–1.2)
Total Protein: 6.6 g/dL (ref 6.0–8.3)

## 2018-04-28 NOTE — Assessment & Plan Note (Addendum)
He states that he is is managing his pain without narcotics,  has stopped using  Vicodin daily . Refill history confirmed via [place] Controlled Substance database, accessed by me today.Marland Kitchen His last refill of Vicodin was June 1,  Qty #30.  He has not refilled the tramadol since 2017 .  Advised to use tylenol 2000 mg daily and prn tramadol

## 2018-04-28 NOTE — Assessment & Plan Note (Signed)
May be affecting his vision.  Referring to [facility]

## 2018-04-28 NOTE — Assessment & Plan Note (Signed)
Suspect cataracts as cause of his worsening vision despite adjustment of lens.  Refer to [facility]

## 2018-04-28 NOTE — Telephone Encounter (Signed)
Tramadol was refilled yesterday at visit

## 2018-04-28 NOTE — Assessment & Plan Note (Addendum)
Managed historically with trazodone and alprazolam.  Weaning  alprazolam  To off per today's discussion

## 2018-05-05 ENCOUNTER — Ambulatory Visit (INDEPENDENT_AMBULATORY_CARE_PROVIDER_SITE_OTHER): Payer: Medicare Other | Admitting: *Deleted

## 2018-05-05 DIAGNOSIS — Z23 Encounter for immunization: Secondary | ICD-10-CM

## 2018-05-24 ENCOUNTER — Ambulatory Visit (INDEPENDENT_AMBULATORY_CARE_PROVIDER_SITE_OTHER): Payer: Medicare Other

## 2018-05-24 DIAGNOSIS — Z5181 Encounter for therapeutic drug level monitoring: Secondary | ICD-10-CM

## 2018-05-24 DIAGNOSIS — I481 Persistent atrial fibrillation: Secondary | ICD-10-CM

## 2018-05-24 DIAGNOSIS — I4819 Other persistent atrial fibrillation: Secondary | ICD-10-CM

## 2018-05-24 LAB — POCT INR: INR: 5.2 — AB (ref 2.0–3.0)

## 2018-05-24 NOTE — Patient Instructions (Signed)
Please have a large serving of greens today, skip coumadin tomorrow & Wednesday, then resume dosage of 1 tablet daily.   Recheck in 1 week.

## 2018-05-31 ENCOUNTER — Ambulatory Visit (INDEPENDENT_AMBULATORY_CARE_PROVIDER_SITE_OTHER): Payer: Medicare Other

## 2018-05-31 DIAGNOSIS — I4819 Other persistent atrial fibrillation: Secondary | ICD-10-CM | POA: Diagnosis not present

## 2018-05-31 DIAGNOSIS — Z5181 Encounter for therapeutic drug level monitoring: Secondary | ICD-10-CM | POA: Diagnosis not present

## 2018-05-31 LAB — POCT INR: INR: 1.3 — AB (ref 2.0–3.0)

## 2018-05-31 NOTE — Patient Instructions (Signed)
Please take 1.5 tablets today and tomorrow, then resume dosage of 1 tablet every day.  Recheck in 2 weeks.

## 2018-06-14 ENCOUNTER — Ambulatory Visit (INDEPENDENT_AMBULATORY_CARE_PROVIDER_SITE_OTHER): Payer: Medicare Other

## 2018-06-14 DIAGNOSIS — Z5181 Encounter for therapeutic drug level monitoring: Secondary | ICD-10-CM

## 2018-06-14 DIAGNOSIS — I4819 Other persistent atrial fibrillation: Secondary | ICD-10-CM | POA: Diagnosis not present

## 2018-06-14 LAB — POCT INR: INR: 3.4 — AB (ref 2.0–3.0)

## 2018-06-14 NOTE — Patient Instructions (Signed)
Please take 1/2 tablet today, then resume dosage of 1 tablet every day.  Please be consistent w/ your greens intake.  Recheck in 2 weeks.

## 2018-06-28 ENCOUNTER — Ambulatory Visit (INDEPENDENT_AMBULATORY_CARE_PROVIDER_SITE_OTHER): Payer: Medicare Other

## 2018-06-28 DIAGNOSIS — I4819 Other persistent atrial fibrillation: Secondary | ICD-10-CM | POA: Diagnosis not present

## 2018-06-28 DIAGNOSIS — Z5181 Encounter for therapeutic drug level monitoring: Secondary | ICD-10-CM

## 2018-06-28 LAB — POCT INR: INR: 2.3 (ref 2.0–3.0)

## 2018-06-28 NOTE — Patient Instructions (Signed)
Please continue dosage of 1 tablet every day.  Please be consistent w/ your greens intake.  Recheck in 3 weeks.

## 2018-07-19 ENCOUNTER — Ambulatory Visit (INDEPENDENT_AMBULATORY_CARE_PROVIDER_SITE_OTHER): Payer: Medicare Other

## 2018-07-19 DIAGNOSIS — I4819 Other persistent atrial fibrillation: Secondary | ICD-10-CM

## 2018-07-19 DIAGNOSIS — Z5181 Encounter for therapeutic drug level monitoring: Secondary | ICD-10-CM | POA: Diagnosis not present

## 2018-07-19 LAB — POCT INR: INR: 2.3 (ref 2.0–3.0)

## 2018-07-19 NOTE — Patient Instructions (Signed)
Please continue dosage of 1 tablet every day.  Please be consistent w/ your greens intake.  Recheck in 4 weeks.

## 2018-07-27 ENCOUNTER — Other Ambulatory Visit: Payer: Self-pay

## 2018-07-27 MED ORDER — WARFARIN SODIUM 5 MG PO TABS: ORAL_TABLET | ORAL | 1 refills | Status: DC

## 2018-07-27 MED ORDER — ATORVASTATIN CALCIUM 40 MG PO TABS: 40.0000 mg | ORAL_TABLET | Freq: Every day | ORAL | 1 refills | Status: DC

## 2018-08-16 ENCOUNTER — Ambulatory Visit (INDEPENDENT_AMBULATORY_CARE_PROVIDER_SITE_OTHER): Payer: Medicare Other

## 2018-08-16 DIAGNOSIS — Z5181 Encounter for therapeutic drug level monitoring: Secondary | ICD-10-CM

## 2018-08-16 DIAGNOSIS — I4819 Other persistent atrial fibrillation: Secondary | ICD-10-CM

## 2018-08-16 LAB — POCT INR: INR: 2.3 (ref 2.0–3.0)

## 2018-08-16 NOTE — Patient Instructions (Signed)
Please continue dosage of 1 tablet every day.  Please be consistent w/ your greens intake.  Recheck in 5 weeks.

## 2018-09-01 ENCOUNTER — Other Ambulatory Visit: Payer: Self-pay | Admitting: Internal Medicine

## 2018-09-08 ENCOUNTER — Telehealth: Payer: Self-pay | Admitting: Internal Medicine

## 2018-09-08 ENCOUNTER — Other Ambulatory Visit: Payer: Self-pay | Admitting: Internal Medicine

## 2018-09-08 MED ORDER — METOPROLOL TARTRATE 50 MG PO TABS: ORAL_TABLET | ORAL | 1 refills | Status: DC

## 2018-09-08 MED ORDER — ATORVASTATIN CALCIUM 40 MG PO TABS: 40.0000 mg | ORAL_TABLET | Freq: Every day | ORAL | 1 refills | Status: DC

## 2018-09-08 MED ORDER — FUROSEMIDE 20 MG PO TABS: ORAL_TABLET | ORAL | 2 refills | Status: DC

## 2018-09-08 NOTE — Telephone Encounter (Signed)
Pt's sone Jeffery Reynolds return VM. Please return his call.

## 2018-09-08 NOTE — Telephone Encounter (Signed)
Message from patient's son Jeffery Reynolds:  Medication: traZODone (DESYREL) 150 MG tablet, Jeffery Reynolds is concerned about pt taking this medication due to what he has read, he asked if pt needs it as he doesn't have trouble going to sleep, can he just stop taking trazodone or would that cause problems, requesting call about his medication.  I called the patient's son, Jeffery Reynolds, left VM to return call to give more clarification on the medication issue.

## 2018-09-08 NOTE — Telephone Encounter (Signed)
Copied from Hand [phone]. Topic: Quick Communication - Rx Refill/Question >> Sep 08, 2018 10:59 AM Margot Ables wrote: Medication: traZODone (DESYREL) 150 MG tablet, Percell Miller is concerned about pt taking this medication due to what he has read, he asked if pt needs it as he doesn't have trouble going to sleep, can he just stop taking trazodone or would that cause problems, requesting call about his medication  Medication: allopurinol (ZYLOPRIM) 300 MG tablet - out of this medication - needing sent to local pharmacy Has the patient contacted their pharmacy? No - change of insurance and pharmacy Preferred Pharmacy (with phone number or street name): [id number] [address], [place] [phone] (Phone) [phone] (Fax)  Medication: additional refills for future to new mail order - allopurinol (ZYLOPRIM) 300 MG tablet, atorvastatin (LIPITOR) 40 MG tablet, furosemide (LASIX) 20 MG tablet, metoprolol tartrate (LOPRESSOR) 50 MG tablet, warfarin (COUMADIN) 5 MG tablet Has the patient contacted their pharmacy? Yes - new RXs needed since change in pharmacy Preferred Pharmacy (with phone number or street name): [facility] [place], [place] [phone] (Phone) [phone] (Fax)

## 2018-09-08 NOTE — Telephone Encounter (Signed)
Patient's son Percell Miller called and I asked him to elaborate on the Trazodone. I advised he is not listed on the DPR and that I could only receive information. He says the information he read says that it's an antidepressant and I don't think he's depressed. He goes to sleep at the drop of a hat. My dad told me Dr. Derrel Nip said he may not need to take it, but he said he wanted to continue it, since he's been taking if for 4-5 years. He doesn't have trouble sleeping. I read the side effects and it says it affects memory (loss of memory) and confusion.  So,my question is will it cause withdrawals to stop taking it. I've been here with him for 5 months now and he says he wants to come off of it now. He says he's not depressed and doesn't have trouble sleeping. I advised someone from the office will call with Dr. Lupita Dawn recommendation.

## 2018-09-08 NOTE — Telephone Encounter (Signed)
Requested medication (s) are due for refill today: Yes  Requested medication (s) are on the active medication list: Yes  Last refill:  Allopurinol 01/24/18; Warfarin 07/27/18  Future visit scheduled: Yes  Notes to clinic:  Unable to refill per protocol; Allopurinol failed uric acid; requesting short supply to local pharmacy and long term supply to mail order.     Requested Prescriptions  Pending Prescriptions Disp Refills   allopurinol (ZYLOPRIM) 300 MG tablet 39 tablet 2    Sig: TAKE 1 TABLET ON MONDAY, [place]     Endocrinology:  Gout Agents Failed - 09/08/2018  1:13 PM      Failed - Uric Acid in normal range and within 360 days    Uric Acid, Serum  Date Value Ref Range Status  10/16/2016 5.3 4.0 - 7.8 mg/dL Final         Passed - Cr in normal range and within 360 days    Creatinine, Ser  Date Value Ref Range Status  04/27/2018 1.21 0.40 - 1.50 mg/dL Final         Passed - Valid encounter within last 12 months    Recent Outpatient Visits          4 months ago Worsening vision   [place] Crecencio Mc, MD   8 months ago Left elbow pain   [facility] Crecencio Mc, MD   10 months ago Vitamin D deficiency   [place] Crecencio Mc, MD   1 year ago Chronic pain of left knee   [facility] [place] Crecencio Mc, MD   1 year ago Impaired fasting glucose   [facility] Crecencio Mc, MD      Future Appointments            In 2 weeks O'Brien-Blaney, Bryson Corona, LPN [facility] [place], [place]   In 2 weeks Crecencio Mc, MD [place], [facility]          warfarin (COUMADIN) 5 MG tablet 90 tablet 1    Sig: TAKE 1 TABLET BY MOUTH DAILY OR AS DIRECTED BY COUMADIN CLINIC     Hematology:  Anticoagulants - warfarin Failed - 09/08/2018  1:13 PM      Failed - If the patient is managed by Coumadin Clinic - route to their Pool. If not,  forward to the provider.      Failed - Valid encounter within last 3 months    Recent Outpatient Visits          4 months ago Worsening vision   [place] Crecencio Mc, MD   8 months ago Left elbow pain   [facility] [place] Crecencio Mc, MD   10 months ago Vitamin D deficiency   [facility] [place] Crecencio Mc, MD   1 year ago Chronic pain of left knee   [facility] [place] Crecencio Mc, MD   1 year ago Impaired fasting glucose   [place], MD      Future Appointments            In 2 weeks O'Brien-Blaney, Bryson Corona, LPN [place], [place]   In 2 weeks Crecencio Mc, MD [facility], [place] - INR in normal range and within 30 days  INR  Date Value Ref Range Status  08/16/2018 2.3 2.0 - 3.0 Final  04/16/2018 1.74  Final    Comment:    Performed at [facility], [address] [place], [place] [address]       Signed Prescriptions Disp Refills   atorvastatin (LIPITOR) 40 MG tablet 90 tablet 1    Sig: Take 1 tablet (40 mg total) by mouth daily.     Cardiovascular:  Antilipid - Statins Failed - 09/08/2018  1:13 PM      Failed - HDL in normal range and within 360 days    HDL  Date Value Ref Range Status  01/06/2018 38.50 (L) >39.00 mg/dL Final  03/24/2014 42 >39 mg/dL Final    Comment:    According to ATP-III Guidelines, HDL-C >59 mg/dL is considered a negative risk factor for CHD.         Passed - Total Cholesterol in normal range and within 360 days    Cholesterol, Total  Date Value Ref Range Status  03/24/2014 137 100 - 199 mg/dL Final   Cholesterol  Date Value Ref Range Status  01/06/2018 85 0 - 200 mg/dL Final    Comment:    ATP III Classification       Desirable:  < 200 mg/dL               Borderline High:  200 - 239 mg/dL          High:  > = 240 mg/dL         Passed - LDL in normal  range and within 360 days    LDL Calculated  Date Value Ref Range Status  03/24/2014 59 0 - 99 mg/dL Final   LDL Cholesterol  Date Value Ref Range Status  01/06/2018 26 0 - 99 mg/dL Final         Passed - Triglycerides in normal range and within 360 days    Triglycerides  Date Value Ref Range Status  01/06/2018 99.0 0.0 - 149.0 mg/dL Final    Comment:    Normal:  <150 mg/dLBorderline High:  150 - 199 mg/dL         Passed - Patient is not pregnant      Passed - Valid encounter within last 12 months    Recent Outpatient Visits          4 months ago Worsening vision   [place] Crecencio Mc, MD   8 months ago Left elbow pain   [facility] [place] Crecencio Mc, MD   10 months ago Vitamin D deficiency   [place] Crecencio Mc, MD   1 year ago Chronic pain of left knee   [facility] [place] Crecencio Mc, MD   1 year ago Impaired fasting glucose   [facility] [place] Crecencio Mc, MD      Future Appointments            In 2 weeks O'Brien-Blaney, Bryson Corona, LPN [facility] [place]   In 2 weeks Crecencio Mc, MD [facility] [place], [facility]          furosemide (LASIX) 20 MG tablet 45 tablet 2    Sig: TAKE 1 TABLET BY MOUTH EVERY OTHER DAY FOR FLUID RETENTION     Cardiovascular:  Diuretics - Loop Passed - 09/08/2018  1:13 PM      Passed - K in normal range  and within 360 days    Potassium  Date Value Ref Range Status  04/27/2018 4.3 3.5 - 5.1 mEq/L Final         Passed - Ca in normal range and within 360 days    Calcium  Date Value Ref Range Status  04/27/2018 9.3 8.4 - 10.5 mg/dL Final         Passed - Na in normal range and within 360 days    Sodium  Date Value Ref Range Status  04/27/2018 141 135 - 145 mEq/L Final  03/24/2014 142 134 - 144 mmol/L Final         Passed - Cr in normal range and within 360 days    Creatinine, Ser  Date Value  Ref Range Status  04/27/2018 1.21 0.40 - 1.50 mg/dL Final         Passed - Last BP in normal range    BP Readings from Last 1 Encounters:  04/27/18 118/70         Passed - Valid encounter within last 6 months    Recent Outpatient Visits          4 months ago Worsening vision   [place] Crecencio Mc, MD   8 months ago Left elbow pain   [facility] [place] Crecencio Mc, MD   10 months ago Vitamin D deficiency   [facility] [place] Crecencio Mc, MD   1 year ago Chronic pain of left knee   [facility] [place] Crecencio Mc, MD   1 year ago Impaired fasting glucose   [facility] [place] Crecencio Mc, MD      Future Appointments            In 2 weeks O'Brien-Blaney, Denisa L, LPN [facility] [place], [place]   In 2 weeks Crecencio Mc, MD [facility] [place]          metoprolol tartrate (LOPRESSOR) 50 MG tablet 180 tablet 1    Sig: TAKE 1 TABLET(50 MG) BY MOUTH TWICE DAILY     Cardiovascular:  Beta Blockers Passed - 09/08/2018  1:13 PM      Passed - Last BP in normal range    BP Readings from Last 1 Encounters:  04/27/18 118/70         Passed - Last Heart Rate in normal range    Pulse Readings from Last 1 Encounters:  04/27/18 76         Passed - Valid encounter within last 6 months    Recent Outpatient Visits          4 months ago Worsening vision   [facility] [place] Crecencio Mc, MD   8 months ago Left elbow pain   [facility] [place] Crecencio Mc, MD   10 months ago Vitamin D deficiency   [facility] [place] Crecencio Mc, MD   1 year ago Chronic pain of left knee   [facility]  Crecencio Mc, MD   1 year ago Impaired fasting glucose   [facility]  Crecencio Mc, MD      Future Appointments            In 2 weeks O'Brien-Blaney, Bryson Corona, LPN  Primary  [place], [place]   In 2 weeks Derrel Nip, Aris Everts, MD [facility], [facility]

## 2018-09-08 NOTE — Telephone Encounter (Signed)
He should taper off of the trazodome as follows:  100 mg daily for 2 weeks  50 mg daily for 2 weeks  25 mg daily for 2 weeks  Stop  If his tablets are 50 mg he can use these to doe the taper  If they are 100 mg he'll new a new rx once he gets to the 25 mg dose

## 2018-09-09 ENCOUNTER — Other Ambulatory Visit: Payer: Self-pay | Admitting: Internal Medicine

## 2018-09-09 MED ORDER — WARFARIN SODIUM 5 MG PO TABS: ORAL_TABLET | ORAL | 1 refills | Status: DC

## 2018-09-09 MED ORDER — ALLOPURINOL 300 MG PO TABS: ORAL_TABLET | ORAL | 2 refills | Status: DC

## 2018-09-09 MED ORDER — TRAZODONE HCL 50 MG PO TABS: ORAL_TABLET | ORAL | 0 refills | Status: DC

## 2018-09-09 NOTE — Telephone Encounter (Signed)
Pt has 150mg  tablet

## 2018-09-09 NOTE — Telephone Encounter (Signed)
Spoke with pt's son and informed him that Dr. Derrel Nip has sent in a new rx for his trazodone so that the pt can taper off of the medication. Gave the pt's son the taper directions and the son repeated them back with understanding.

## 2018-09-09 NOTE — Telephone Encounter (Signed)
Do not refill the 150 mg dose of trazodone.  I have sent the lower dose to [facility] in order to taper off medication  Using the 50 mg tablets  The dose will be tapered  as follows:  2 tablets daily (100 mg total)  x 2 weeks, then 1 tablet daily (50 mg )  x 2 weeks,  Then 1/2 tablet daily (25 mg ) x 2 weeks then stop

## 2018-09-09 NOTE — Telephone Encounter (Signed)
LMTCB. Please transfer Jeffery Reynolds to our office. Per Dr. Derrel Nip it is okay to talk to pt's son, Jeffery Reynolds, because the pt has dementia.

## 2018-09-09 NOTE — Telephone Encounter (Signed)
See rx response message where we spoke with pt's son in regards to new medication dose.

## 2018-09-09 NOTE — Telephone Encounter (Signed)
New rx sent for 50 mg tablet

## 2018-09-10 ENCOUNTER — Other Ambulatory Visit: Payer: Self-pay | Admitting: Internal Medicine

## 2018-09-10 MED ORDER — ALLOPURINOL 300 MG PO TABS: ORAL_TABLET | ORAL | 0 refills | Status: DC

## 2018-09-10 NOTE — Telephone Encounter (Signed)
Until mail order arrives

## 2018-09-10 NOTE — Telephone Encounter (Signed)
Copied from [place] [phone]. Topic: Quick Communication - Rx Refill/Question >> Sep 10, 2018 12:41 PM Scherrie Gerlach wrote: Medication: allopurinol (ZYLOPRIM) 300 MG tablet    12 tabs (pt takes 3 a week) Son states he was told a supply of this med was going to be sent to local pharmacy, along with the [facility] to [facility], because pt is out. The mail order was sent but pharmacy does not have the 12 tabs. Son hopes they can pick up today.  [id number] [address], [place] [phone] (Phone) [phone] (Fax)

## 2018-09-20 ENCOUNTER — Ambulatory Visit (INDEPENDENT_AMBULATORY_CARE_PROVIDER_SITE_OTHER): Payer: Medicare Other

## 2018-09-20 DIAGNOSIS — I4819 Other persistent atrial fibrillation: Secondary | ICD-10-CM | POA: Diagnosis not present

## 2018-09-20 DIAGNOSIS — Z5181 Encounter for therapeutic drug level monitoring: Secondary | ICD-10-CM

## 2018-09-20 LAB — POCT INR: INR: 1.7 — AB (ref 2.0–3.0)

## 2018-09-20 NOTE — Patient Instructions (Signed)
Please take 2 tablets tonight & tomorrow, then continue dosage of 1 tablet every day.  Please be consistent w/ your greens intake.  Recheck in 5 weeks.

## 2018-09-24 ENCOUNTER — Encounter: Payer: Self-pay | Admitting: Internal Medicine

## 2018-09-24 ENCOUNTER — Ambulatory Visit (INDEPENDENT_AMBULATORY_CARE_PROVIDER_SITE_OTHER): Payer: Medicare Other

## 2018-09-24 ENCOUNTER — Ambulatory Visit (INDEPENDENT_AMBULATORY_CARE_PROVIDER_SITE_OTHER): Payer: Medicare Other | Admitting: Internal Medicine

## 2018-09-24 VITALS — BP 110/68 | HR 83 | Temp 97.7°F | Resp 16 | Ht 68.0 in | Wt 219.0 lb

## 2018-09-24 VITALS — BP 110/68 | HR 83 | Temp 97.7°F | Resp 16 | Ht 68.0 in | Wt 219.1 lb

## 2018-09-24 DIAGNOSIS — R4189 Other symptoms and signs involving cognitive functions and awareness: Secondary | ICD-10-CM | POA: Diagnosis not present

## 2018-09-24 DIAGNOSIS — E782 Mixed hyperlipidemia: Secondary | ICD-10-CM | POA: Diagnosis not present

## 2018-09-24 DIAGNOSIS — I10 Essential (primary) hypertension: Secondary | ICD-10-CM | POA: Diagnosis not present

## 2018-09-24 DIAGNOSIS — Z0000 Encounter for general adult medical examination without abnormal findings: Secondary | ICD-10-CM

## 2018-09-24 DIAGNOSIS — G8929 Other chronic pain: Secondary | ICD-10-CM | POA: Diagnosis not present

## 2018-09-24 DIAGNOSIS — R7301 Impaired fasting glucose: Secondary | ICD-10-CM | POA: Diagnosis not present

## 2018-09-24 DIAGNOSIS — M25562 Pain in left knee: Secondary | ICD-10-CM

## 2018-09-24 DIAGNOSIS — G3184 Mild cognitive impairment, so stated: Secondary | ICD-10-CM

## 2018-09-24 LAB — COMPREHENSIVE METABOLIC PANEL
ALT: 30 U/L (ref 0–53)
AST: 22 U/L (ref 0–37)
Albumin: 3.9 g/dL (ref 3.5–5.2)
Alkaline Phosphatase: 93 U/L (ref 39–117)
BUN: 16 mg/dL (ref 6–23)
CO2: 29 mEq/L (ref 19–32)
Calcium: 9 mg/dL (ref 8.4–10.5)
Chloride: 104 mEq/L (ref 96–112)
Creatinine, Ser: 1.08 mg/dL (ref 0.40–1.50)
GFR: 65.28 mL/min (ref >60.00)
Glucose, Bld: 117 mg/dL — ABNORMAL HIGH (ref 70–99)
Potassium: 4 mEq/L (ref 3.5–5.1)
Sodium: 139 mEq/L (ref 135–145)
Total Bilirubin: 0.9 mg/dL (ref 0.2–1.2)
Total Protein: 5.8 g/dL — ABNORMAL LOW (ref 6.0–8.3)

## 2018-09-24 LAB — B12 AND FOLATE PANEL
Folate: 14.3 ng/mL (ref >5.9)
Vitamin B-12: 364 pg/mL (ref 211–911)

## 2018-09-24 LAB — HEMOGLOBIN A1C: Hgb A1c MFr Bld: 5.4 % (ref 4.6–6.5)

## 2018-09-24 MED ORDER — TRAMADOL HCL 50 MG PO TABS: 50.0000 mg | ORAL_TABLET | Freq: Every day | ORAL | 5 refills | Status: DC | PRN

## 2018-09-24 NOTE — Patient Instructions (Addendum)
  Jeffery Reynolds , Thank you for taking time to come for your Medicare Wellness Visit. I appreciate your ongoing commitment to your health goals. Please review the following plan we discussed and let me know if I can assist you in the future.   These are the goals we discussed: Goals    . Weight less than 200lb     Low carb diet, low fat diet Portion control       This is a list of the screening recommended for you and due dates:  Health Maintenance  Topic Date Due  . Tetanus Vaccine  04/16/2028  . Flu Shot  Completed  . Pneumonia vaccines  Completed

## 2018-09-24 NOTE — Patient Instructions (Addendum)
Your knee pain needs to be managed/treated:   You can take  up to 2000 mg of acetominophen (tylenol) every day safely  In divided doses (500 mg every 6 hours  Or 1000 mg every 12 hours., or 650 mg every 8 hours)    I will refill the tramadol to add for moderate pain;;  You can use it  one time daily during the day .

## 2018-09-24 NOTE — Progress Notes (Signed)
Subjective:   Jeffery Reynolds is a 85 y.o. male who presents for Medicare Annual/Subsequent preventive examination.  Review of Systems:  No ROS.  Medicare Wellness Visit. Additional risk factors are reflected in the social history. Cardiac Risk Factors include: advanced age (>61men, >31 women);hypertension;male gender     Objective:    Vitals: BP 110/68 (BP Location: Left Arm, Patient Position: Sitting, Cuff Size: Normal)   Pulse 83   Temp 97.7 F (36.5 C) (Oral)   Resp 16   Ht 5\' 8"  (1.727 m)   Wt 219 lb 1.9 oz (99.4 kg)   SpO2 97%   BMI 33.32 kg/m   Body mass index is 33.32 kg/m.  Advanced Directives 09/24/2018 04/16/2018 09/23/2017 02/04/2017 12/09/2016 09/22/2016 09/11/2015  Does Patient Have a Medical Advance Directive? Yes Yes Yes - Yes Yes Yes  Type of Advance Directive [place];Living will [place];Living will Healthcare Power of [place];Living will Living will [place];Living will [place];Living will  Does patient want to make changes to medical advance directive? No - Patient declined - No - Patient declined - - No - Patient declined No - Patient declined  Copy of [place] in Chart? No - copy requested - No - copy requested - - No - copy requested No - copy requested  Would patient like information on creating a medical advance directive? - - - - - - -    Tobacco Social History   Tobacco Use  Smoking Status Never Smoker  Smokeless Tobacco Never Used     Counseling given: Not Answered   Clinical Intake:  Pre-visit preparation completed: Yes        Diabetes: No  How often do you need to have someone help you when you read instructions, pamphlets, or other written materials from your doctor or pharmacy?: 1 - Never  Interpreter Needed?: No     Past Medical History:  Diagnosis Date  . Arthritis   . CAD (coronary artery  disease)    a. s/p 6-V CABG in [place] in 1995; b. MV 4/12: mild intensity, fixed perfusion defect involving the basal to mid anterior wall consistent with injury to the diagonal distribution (this finding was also noted on prior study dating back to 2008). Overall, no ischemia was noted and there was no high-risk prognostic indicators based on this study. EF 63%  . Carotid artery disease ([place])    a. ultrasound 12/16: 1-39% bilateral ICA stenosis  . Chronic atrial fibrillation 2000   a. CHADS2VASc => 4 (HTN, age x 2, vascular disease); b. on Coumadin [facility]  . Chronic back pain   . Cognitive impairment   . Colon polyps   . Diastolic dysfunction    a. TTE 4/10: EF of 19%, mild diastolic dysfunction, no significant valvular abnormalities, mild ascending aortic root dilatation, moderate left atrial enlargement, technically difficult study  . Genital warts   . GERD (gastroesophageal reflux disease)   . Gout    Controlled with Allopurinol  . History of blood transfusion   . History of chicken pox   . Hyperlipidemia   . Hypertension   . Seasonal allergies   . Sleep apnea, obstructive   . 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 CARDIAC CATHETERIZATION    . CORONARY ARTERY BYPASS GRAFT  1995   CABG x 6 in [place], [place]  SURGERY    . GYNECOMASTIA EXCISION    . KNEE SURGERY     bilateral   . ROTATOR CUFF REPAIR     right  . TONSILLECTOMY AND ADENOIDECTOMY  1943   Family History  Problem Relation Age of Onset  . Heart disease Father   . Heart attack Father 58       MI  . Alcohol abuse Father   . Hyperlipidemia Father   . Hypertension Father   . Heart attack Paternal Grandfather 29  . Alcohol abuse Paternal Grandfather   . Cancer Mother        Breast cancer and Lung Cancer  . Diabetes Brother   . Dementia Brother   . Crohn's disease Daughter   . Cancer Brother        lung cancer  . Alcohol abuse Son   .  Alcohol abuse Maternal Grandfather   . Alcohol abuse Son   . Heart attack Paternal Grandmother 52   Social History   Socioeconomic History  . Marital status: Divorced    Spouse name: Not on file  . Number of children: 7  . Years of education: 72  . Highest education level: Not on file  Occupational History  . Occupation: Scientist, research (medical) - Engineer, maintenance - [employer]    Comment: Retired 1995  Social Needs  . Financial resource strain: Not hard at all  . Food insecurity:    Worry: Never true    Inability: Never true  . Transportation needs:    Medical: No    Non-medical: No  Tobacco Use  . Smoking status: Never Smoker  . Smokeless tobacco: Never Used  Substance and Sexual Activity  . Alcohol use: Yes    Comment: occasional   . Drug use: No  . Sexual activity: Not Currently  Lifestyle  . Physical activity:    Days per week: 5 days    Minutes per session: 90 min  . Stress: Not at all  Relationships  . Social connections:    Talks on phone: Not on file    Gets together: Not on file    Attends religious service: Not on file    Active member of club or organization: Not on file    Attends meetings of clubs or organizations: Not on file    Relationship status: Not on file  Other Topics Concern  . Not on file  Social History Narrative   Mr. Phifer grew up in [place], [place]. Most recently he was living in [place]. He moved to the [place] area since March 2015. Recently divorced and moved to this area to be close to one of his sons. He enjoys reading and has been going to the gym to exercise.             Outpatient Encounter Medications as of 09/24/2018  Medication Sig  . allopurinol (ZYLOPRIM) 300 MG tablet TAKE 1 TABLET ON MONDAY, WEDNESDAY AND FRIDAY  . atorvastatin (LIPITOR) 40 MG tablet Take 1 tablet (40 mg total) by mouth daily.  . furosemide (LASIX) 20 MG tablet TAKE 1 TABLET BY MOUTH EVERY OTHER DAY FOR FLUID RETENTION  . metoprolol tartrate (LOPRESSOR) 50 MG tablet TAKE  1 TABLET(50 MG) BY MOUTH TWICE DAILY  . traZODone (DESYREL) 50 MG tablet 2 tablets daily x 2 weeks, then 1 tablet daily x 2 weeks,  Then 1/2 tablet daily x 2 weeks then stop  . warfarin (COUMADIN) 5 MG tablet TAKE 1 TABLET BY MOUTH DAILY OR AS DIRECTED  BY COUMADIN CLINIC  . Artificial Tear Ointment (DRY EYES OP) Apply 1 drop 3 (three) times daily to eye.  . [DISCONTINUED] traMADol (ULTRAM) 50 MG tablet Take 1 tablet (50 mg total) by mouth every 6 (six) hours as needed. Maximum 3 daily (Patient not taking: Reported on 09/24/2018)   No facility-administered encounter medications on file as of 09/24/2018.     Activities of Daily Living In your present state of health, do you have any difficulty performing the following activities: 09/24/2018  Hearing? N  Vision? N  Difficulty concentrating or making decisions? N  Walking or climbing stairs? N  Dressing or bathing? N  Doing errands, shopping? N  Preparing Food and eating ? N  Using the Toilet? N  In the past six months, have you accidently leaked urine? N  Do you have problems with loss of bowel control? N  Managing your Medications? N  Managing your Finances? N  Housekeeping or managing your Housekeeping? N  Some recent data might be hidden    Patient Care Team: Crecencio Mc, MD as PCP - General (Internal Medicine)   Assessment:   This is a routine wellness examination for Dejan.  Health Screenings  Colonoscopy -08/26/11 Hearing -demonstrates normal hearing in conversation Cholesterol -01/06/18 (85) Vit B12- 10/14/17 (450) Dental- every 12 months Vision- every 12 months  Social  Alcohol intake -yes, OCC Smoking history- -no Smokers in home? Son lives in the home and smokes outside. Illicit drug use? none Exercise -weights, row machine, stationary bike  3-5 days weekly, 90 minutes Diet -regular Sexually Active- no  Safety  Patient feels safe at home. Son now resides with him.  Patient does have smoke detectors at home    Patient does wear sunscreen or protective clothing when in direct sunlight. Patient does wear seat belt when driving or riding with others.   Activities of Daily Living Patient can do their own household chores. Denies needing assistance with: driving, feeding themselves, getting from bed to chair, getting to the toilet, bathing/showering, dressing, managing money, climbing flight of stairs, or preparing meals.   Depression Screen Patient denies losing interest in daily life, feeling hopeless, or crying easily over simple problems.   Fall Screen Patient denies being afraid of falling.  Patient fell about 8 months ago and required stitches on the forehead.  His exercise machine gave way during a workout.    Memory Screen Patient denies problems with memory, misplacing items, and is able to balance checkbook/bank accounts.  Patient is alert, normal appearance, oriented to person/place/and time. Correctly identified the president of the [place], recall of 1/3 objects, and performing simple calculations.  Patient displays appropriate judgement and can read correct time from watch face.   Immunizations The following Immunizations are up to date: Influenza, shingles, pneumonia. Tdap discussed.   Other Providers Patient Care Team: Crecencio Mc, MD as PCP - General (Internal Medicine)  Exercise Activities and Dietary recommendations Current Exercise Habits: Home exercise routine, Type of exercise: strength training/weights;stretching, Frequency (Times/Week): 5, Intensity: Moderate  Goals    . Weight less than 200lb     Low carb diet, low fat diet Portion control       Fall Risk Fall Risk  09/24/2018 09/23/2017 09/22/2016 01/30/2016 09/11/2015  Falls in the past year? 1 Yes Yes No No  Comment No falls since the last 8 months ago.  - - - -  Number falls in past yr: 0 2 or more 1 - -  Injury with  Fall? 1 No No - -  Comment Followed by pcp.  Exercise machine gave way during a work out.  - -  - -  Follow up - Education provided;Falls prevention discussed Falls prevention discussed - -  Comment - First fall the floor was slick, the second fall he missed a step - - -    Depression Screen PHQ 2/9 Scores 09/24/2018 09/23/2017 09/22/2016 01/30/2016  PHQ - 2 Score 0 0 0 0    Cognitive Function MMSE - Mini Mental State Exam 09/23/2017 09/22/2016 09/11/2015  Orientation to time 5 5 5   Orientation to Place 5 5 5   Registration 3 3 3   Attention/ Calculation 5 5 5   Recall 3 1 3   Recall-comments - 1 out of 3 words recalled -  Language- name 2 objects 2 2 2   Language- repeat 1 1 1   Language- follow 3 step command 3 3 3   Language- read & follow direction 1 1 1   Write a sentence 1 1 1   Copy design 1 1 1   Total score 30 28 30      6CIT Screen 09/24/2018  What Year? 0 points  What month? 0 points  What time? 0 points  Count back from 20 0 points  Months in reverse 0 points  Repeat phrase 0 points  Total Score 0    Immunization History  Administered Date(s) Administered  . Influenza, High Dose Seasonal PF 05/26/2016, 07/10/2017, 05/05/2018  . Influenza,inj,Quad PF,6+ Mos 06/10/2014  . Influenza-Unspecified 06/30/2015  . Pneumococcal Conjugate-13 08/10/2013  . Pneumococcal Polysaccharide-23 08/14/2014  . Tdap 11/12/2013, 04/16/2018  . Zoster 04/19/2014   Screening Tests Health Maintenance  Topic Date Due  . TETANUS/TDAP  04/16/2028  . INFLUENZA VACCINE  Completed  . PNA vac Low Risk Adult  Completed       Plan:   End of life planning; Advance aging; Advanced directives discussed. Copy of current HCPOA/Living Will requested.    I have personally reviewed and noted the following in the patient's chart:   . Medical and social history . Use of alcohol, tobacco or illicit drugs  . Current medications and supplements . Functional ability and status . Nutritional status . Physical activity . Advanced directives . List of other physicians . Hospitalizations, surgeries, and  ER visits in previous 12 months . Vitals . Screenings to include cognitive, depression, and falls . Referrals and appointments  In addition, I have reviewed and discussed with patient certain preventive protocols, quality metrics, and best practice recommendations. A written personalized care plan for preventive services as well as general preventive health recommendations were provided to patient.     OBrien-Blaney, Clary Meeker L, LPN  11/30/6806    I have reviewed the above information and agree with above.   Deborra Medina, MD

## 2018-09-24 NOTE — Progress Notes (Signed)
Patient ID: Jeffery Reynolds, male    DOB: [birth year]  Age: 85 y.o. MRN: [id number]  The patient is here for follow up and  management of other chronic and acute problems, including cognitive decline, hypertension, gout,   IPG, and chronic pain    The risk factors are reflected in the social history.  The roster of all physicians providing medical care to patient - is listed in the Snapshot section of the chart.  Activities of daily living:  The patient is 100% independent in all ADLs: dressing, toileting, feeding as well as independent mobility  Home safety : The patient has smoke detectors in the home. They wear seatbelts.  There are no firearms at home. There is no violence in the home.   There is no risks for hepatitis, STDs or HIV. There is no   history of blood transfusion. They have no travel history to infectious disease endemic areas of the world.  The patient has seen their dentist in the last six month. They have seen their eye doctor in the last year. They admit to slight hearing difficulty with regard to whispered voices and some television programs.  They have deferred audiologic testing in the last year.  They do not  have excessive sun exposure. Discussed the need for sun protection: hats, long sleeves and use of sunscreen if there is significant sun exposure.   Diet: the importance of a healthy diet is discussed. They do have a healthy diet.  The benefits of regular aerobic exercise were discussed. He exercises at a gym, 4 times per week ,  60 minutes.   Depression screen: there are no signs or vegative symptoms of depression- irritability, change in appetite, anhedonia, sadness/tearfullness.  Cognitive assessment: the patient is having increased difficulty managing  their financial and personal affairs .  His alcoholic son moved in with him 6 months ago and is managing his medications and cooking for him . His son continues to drink on a daily basis  despite having  A DUI and  losing his job as a Publishing copy caddy and patient has been trying to persuade him to limit his intake..  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  They could relate day,date,year and events; recalled 2/3 objects at 3 minutes; performed clock-face test normally.  The following portions of the patient's history were reviewed and updated as appropriate: allergies, current medications, past family history, past medical history,  past surgical history, past social history  and problem list.  Visual acuity was not assessed per patient preference since she has regular follow up with her ophthalmologist. Hearing and body mass index were assessed and reviewed.   During the course of the visit the patient was educated and counseled about appropriate screening and preventive services including : fall prevention , diabetes screening, nutrition counseling, colorectal cancer screening, and recommended immunizations.    CC: The primary encounter diagnosis was Cognitive change. Diagnoses of Essential hypertension, Impaired fasting glucose, Mixed hyperlipidemia, Chronic pain of left knee, and Mild cognitive impairment were also pertinent to this visit.  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   He has a past surgical history that includes Coronary artery bypass graft (1995);  Knee surgery; Rotator cuff repair; Gallbladder surgery; Cardiac catheterization; Tonsillectomy and adenoidectomy (1943); and Gynecomastia excision.   His family history includes Alcohol abuse in his father, maternal grandfather, paternal grandfather, son, and son; Cancer in his brother and mother; Crohn's disease in his daughter; Dementia in his brother; Diabetes in his brother; Heart attack (age of onset: 75) in his father; Heart attack (age of onset: 35) in his paternal grandfather; Heart attack (age of onset: 54) in his paternal grandmother; Heart disease in his father; Hyperlipidemia in his father; Hypertension in his father.He reports that he has never smoked. He has never used smokeless tobacco. He reports current alcohol use. He reports that he does not use drugs.  Outpatient Medications Prior to Visit  Medication Sig Dispense Refill  . allopurinol (ZYLOPRIM) 300 MG tablet TAKE 1 TABLET ON MONDAY, WEDNESDAY AND FRIDAY 12 tablet 0  . Artificial Tear Ointment (DRY EYES OP) Apply 1 drop 3 (three) times daily to eye.    Marland Kitchen atorvastatin (LIPITOR) 40 MG tablet Take 1 tablet (40 mg total) by mouth daily. 90 tablet 1  . furosemide (LASIX) 20 MG tablet TAKE 1 TABLET BY MOUTH EVERY OTHER DAY FOR FLUID RETENTION 45 tablet 2  . metoprolol tartrate (LOPRESSOR) 50 MG tablet TAKE 1 TABLET(50 MG) BY MOUTH TWICE DAILY 180 tablet 1  . traZODone (DESYREL) 50 MG tablet 2 tablets daily x 2 weeks, then 1 tablet daily x 2 weeks,  Then 1/2 tablet daily x 2 weeks then stop 60 tablet 0  . warfarin (COUMADIN) 5 MG tablet TAKE 1 TABLET BY MOUTH DAILY OR AS DIRECTED BY COUMADIN CLINIC 90 tablet 1   No facility-administered medications prior to visit.     Review of Systems   Patient denies headache, fevers, malaise, unintentional weight loss, skin rash, eye pain, sinus congestion and sinus pain, sore throat, dysphagia,  hemoptysis , cough, dyspnea, wheezing, chest pain, palpitations, orthopnea, edema, abdominal pain,  nausea, melena, diarrhea, constipation, flank pain, dysuria, hematuria, urinary  Frequency, nocturia, numbness, tingling, seizures,  Focal weakness, Loss of consciousness,  Tremor, insomnia, depression, anxiety, and suicidal ideation.      Objective:  BP 110/68 (BP Location: Left Arm, Patient Position: Sitting, Cuff Size: Normal)   Pulse 83   Temp 97.7 F (36.5 C) (Oral)   Resp 16   Ht 5\' 8"  (1.727 m)   Wt 219 lb (99.3 kg)   SpO2 97%   BMI 33.30 kg/m   Physical Exam   General appearance: alert, cooperative and appears stated age Ears: normal TM's and external ear canals both ears Throat: lips, mucosa, and tongue normal; teeth and gums normal Neck: no adenopathy, no carotid bruit, supple, symmetrical, trachea midline and thyroid not enlarged, symmetric, no tenderness/mass/nodules Back: symmetric, no curvature. ROM normal. No CVA tenderness. Lungs: clear to auscultation bilaterally Heart: regular rate and rhythm, S1, S2 normal, no murmur, click, rub or gallop Abdomen: soft, non-tender; bowel sounds normal; no masses,  no organomegaly Pulses: 2+ and symmetric Skin: Skin color, texture, turgor normal. No rashes or lesions Lymph nodes: Cervical, supraclavicular, and axillary nodes normal.   Assessment & Plan:   Problem List Items Addressed This Visit    Chronic pain of left knee    He states  that he is is managing his pain without narcotics,  has stopped using  Tramadol  daily but is in chronic pain. . Advised to use tylenol 2000 mg daily and prn tramadol        Relevant Medications   traMADol (ULTRAM) 50 MG tablet   Essential hypertension    Well controlled on current regimen. Renal function stable, no changes today.  Lab Results  Component Value Date   CREATININE 1.08 09/24/2018   Lab Results  Component Value Date   NA 139 09/24/2018   K 4.0 09/24/2018   CL 104 09/24/2018   CO2 29 09/24/2018         Relevant Orders   Comprehensive metabolic panel (Completed)    Hyperlipidemia    Managed with Lipitor.  LDL is at goal .  No changes today   Lab Results  Component Value Date   CHOL 85 01/06/2018   HDL 38.50 (L) 01/06/2018   LDLCALC 26 01/06/2018   LDLDIRECT 45.0 09/11/2015   TRIG 99.0 01/06/2018   CHOLHDL 2 01/06/2018   Lab Results  Component Value Date   ALT 30 09/24/2018   AST 22 09/24/2018   ALKPHOS 93 09/24/2018   BILITOT 0.9 09/24/2018         Impaired fasting glucose    A1c is normal.  Will check annually  Lab Results  Component Value Date   HGBA1C 5.4 09/24/2018         Relevant Orders   Hemoglobin A1c (Completed)   Mild cognitive impairment    Secondary to vascular dementia and chronic learning disability He remains independently functional . He appears to be taking his medications correctly and has never missed an appointment        Other Visit Diagnoses    Cognitive change    -  Primary   Relevant Orders   B12 and Folate Panel (Completed)     A total of 40 minutes was spent with patient more than half of which was spent in counseling patient on the above mentioned issues , reviewing and explaining recent labs and imaging studies done, and coordination of care.   I am having Chrstopher T. Nancarrow start on traMADol. I am also having him maintain his Artificial Tear Ointment (DRY EYES OP), atorvastatin, furosemide, metoprolol tartrate, warfarin, traZODone, and allopurinol.  Meds ordered this encounter  Medications  . traMADol (ULTRAM) 50 MG tablet    Sig: Take 1 tablet (50 mg total) by mouth daily as needed.    Dispense:  30 tablet    Refill:  5    There are no discontinued medications.  Follow-up: Return in about 6 months (around 03/25/2019).   Crecencio Mc, MD

## 2018-09-26 NOTE — Assessment & Plan Note (Signed)
Secondary to vascular dementia and chronic learning disability He remains independently functional . He appears to be taking his medications correctly and has never missed an appointment

## 2018-09-26 NOTE — Assessment & Plan Note (Signed)
Well controlled on current regimen. Renal function stable, no changes today.  Lab Results  Component Value Date   CREATININE 1.08 09/24/2018   Lab Results  Component Value Date   NA 139 09/24/2018   K 4.0 09/24/2018   CL 104 09/24/2018   CO2 29 09/24/2018

## 2018-09-26 NOTE — Assessment & Plan Note (Signed)
He states that he is is managing his pain without narcotics,  has stopped using  Tramadol  daily but is in chronic pain. . Advised to use tylenol 2000 mg daily and prn tramadol

## 2018-09-26 NOTE — Assessment & Plan Note (Signed)
Managed with Lipitor.  LDL is at goal .  No changes today   Lab Results  Component Value Date   CHOL 85 01/06/2018   HDL 38.50 (L) 01/06/2018   LDLCALC 26 01/06/2018   LDLDIRECT 45.0 09/11/2015   TRIG 99.0 01/06/2018   CHOLHDL 2 01/06/2018   Lab Results  Component Value Date   ALT 30 09/24/2018   AST 22 09/24/2018   ALKPHOS 93 09/24/2018   BILITOT 0.9 09/24/2018

## 2018-09-26 NOTE — Assessment & Plan Note (Signed)
A1c is normal.  Will check annually  Lab Results  Component Value Date   HGBA1C 5.4 09/24/2018

## 2018-10-01 ENCOUNTER — Ambulatory Visit (INDEPENDENT_AMBULATORY_CARE_PROVIDER_SITE_OTHER): Payer: Medicare Other | Admitting: Internal Medicine

## 2018-10-01 ENCOUNTER — Encounter: Payer: Self-pay | Admitting: Internal Medicine

## 2018-10-01 DIAGNOSIS — H6123 Impacted cerumen, bilateral: Secondary | ICD-10-CM | POA: Diagnosis not present

## 2018-10-01 DIAGNOSIS — H6120 Impacted cerumen, unspecified ear: Secondary | ICD-10-CM | POA: Insufficient documentation

## 2018-10-01 NOTE — Progress Notes (Signed)
Subjective:  Patient ID: Jeffery Reynolds, male    DOB: 12/22/1933  Age: 85 y.o. MRN: [id number]  CC: The encounter diagnosis was Bilateral impacted cerumen.  HPI Jeffery Reynolds presents for evaluation and treatment of cerumen impaction.  Has not had his ears cleaned in 2 or 3 yars .  Does not use any otc rinses. has noticed some hearing loss.     Outpatient Medications Prior to Visit  Medication Sig Dispense Refill  . allopurinol (ZYLOPRIM) 300 MG tablet TAKE 1 TABLET ON MONDAY, WEDNESDAY AND FRIDAY 12 tablet 0  . Artificial Tear Ointment (DRY EYES OP) Apply 1 drop 3 (three) times daily to eye.    Marland Kitchen atorvastatin (LIPITOR) 40 MG tablet Take 1 tablet (40 mg total) by mouth daily. 90 tablet 1  . furosemide (LASIX) 20 MG tablet TAKE 1 TABLET BY MOUTH EVERY OTHER DAY FOR FLUID RETENTION 45 tablet 2  . metoprolol tartrate (LOPRESSOR) 50 MG tablet TAKE 1 TABLET(50 MG) BY MOUTH TWICE DAILY 180 tablet 1  . traMADol (ULTRAM) 50 MG tablet Take 1 tablet (50 mg total) by mouth daily as needed. 30 tablet 5  . traZODone (DESYREL) 50 MG tablet 2 tablets daily x 2 weeks, then 1 tablet daily x 2 weeks,  Then 1/2 tablet daily x 2 weeks then stop 60 tablet 0  . warfarin (COUMADIN) 5 MG tablet TAKE 1 TABLET BY MOUTH DAILY OR AS DIRECTED BY COUMADIN CLINIC 90 tablet 1   No facility-administered medications prior to visit.     Review of Systems;  Patient denies headache, fevers, malaise, unintentional weight loss, skin rash, eye pain, sinus congestion and sinus pain, sore throat, dysphagia,  hemoptysis , cough, dyspnea, wheezing, chest pain, palpitations, orthopnea, edema, abdominal pain, nausea, melena, diarrhea, constipation, flank pain, dysuria, hematuria, urinary  Frequency, nocturia, numbness, tingling, seizures,  Focal weakness, Loss of consciousness,  Tremor, insomnia, depression, anxiety, and suicidal ideation.      Objective:  BP 102/64 (BP Location: Left Arm, Patient Position: Sitting, Cuff  Size: Normal)   Pulse 83   Temp 97.6 F (36.4 C) (Oral)   Resp 15   Ht 5\' 8"  (1.727 m)   Wt 218 lb 12.8 oz (99.2 kg)   SpO2 95%   BMI 33.27 kg/m   BP Readings from Last 3 Encounters:  10/01/18 102/64  09/24/18 110/68  09/24/18 110/68    Wt Readings from Last 3 Encounters:  10/01/18 218 lb 12.8 oz (99.2 kg)  09/24/18 219 lb (99.3 kg)  09/24/18 219 lb 1.9 oz (99.4 kg)    General appearance: alert, cooperative and appears stated age Ears: occluded ear canals bilaterally   Lungs: clear to auscultation bilaterally Heart: regular rate and rhythm, S1, S2 normal, no murmur, click, rub or gallop   Lab Results  Component Value Date   HGBA1C 5.4 09/24/2018   HGBA1C 5.4 01/26/2017   HGBA1C 5.1 04/18/2014    Lab Results  Component Value Date   CREATININE 1.08 09/24/2018   CREATININE 1.21 04/27/2018   CREATININE 1.04 04/16/2018    Lab Results  Component Value Date   WBC 3.8 04/16/2018   HGB 13.4 04/16/2018   HCT 38.7 (L) 04/16/2018   PLT 83 (L) 04/16/2018   GLUCOSE 117 (H) 09/24/2018   CHOL 85 01/06/2018   TRIG 99.0 01/06/2018   HDL 38.50 (L) 01/06/2018   LDLDIRECT 45.0 09/11/2015   LDLCALC 26 01/06/2018   ALT 30 09/24/2018   AST 22 09/24/2018   NA  139 09/24/2018   K 4.0 09/24/2018   CL 104 09/24/2018   CREATININE 1.08 09/24/2018   BUN 16 09/24/2018   CO2 29 09/24/2018   TSH 2.43 09/11/2015   INR 1.7 (A) 09/20/2018   HGBA1C 5.4 09/24/2018    Ct Head Wo Contrast  Result Date: 04/16/2018 CLINICAL DATA:  Status post trip and fall today with a blow to the face. Initial encounter. EXAM: CT HEAD WITHOUT CONTRAST CT CERVICAL SPINE WITHOUT CONTRAST TECHNIQUE: Multidetector CT imaging of the head and cervical spine was performed following the standard protocol without intravenous contrast. Multiplanar CT image reconstructions of the cervical spine were also generated. COMPARISON:  Brain MRI 06/25/2017. FINDINGS: CT HEAD FINDINGS Brain: No evidence of acute infarction,  hemorrhage, hydrocephalus, extra-axial collection or mass lesion/mass effect. Cortical atrophy and mild chronic microvascular ischemic change are noted. Vascular: No hyperdense vessel or unexpected calcification. Skull: Normal. Negative for fracture or focal lesion. Sinuses/Orbits: Negative. Other: Soft tissue contusion/laceration about the right side of the nose and eye noted. CT CERVICAL SPINE FINDINGS Alignment: Maintained with straightening of lordosis noted. Skull base and vertebrae: No acute fracture. No primary bone lesion or focal pathologic process. Soft tissues and spinal canal: No prevertebral fluid or swelling. No visible canal hematoma. Disc levels: Loss of disc space height and endplate spurring appear worst at C5-6 and C6-7. Scattered facet degenerative change also noted. Upper chest: Negative. Other: None. IMPRESSION: Laceration on the right side of the nose near the right eye without underlying fracture or acute intracranial abnormality. No acute abnormality cervical spine. Atrophy and mild chronic microvascular ischemic change. Cervical spondylosis. Electronically Signed   By: Inge Rise M.D.   On: 04/16/2018 13:25   Ct Cervical Spine Wo Contrast  Result Date: 04/16/2018 CLINICAL DATA:  Status post trip and fall today with a blow to the face. Initial encounter. EXAM: CT HEAD WITHOUT CONTRAST CT CERVICAL SPINE WITHOUT CONTRAST TECHNIQUE: Multidetector CT imaging of the head and cervical spine was performed following the standard protocol without intravenous contrast. Multiplanar CT image reconstructions of the cervical spine were also generated. COMPARISON:  Brain MRI 06/25/2017. FINDINGS: CT HEAD FINDINGS Brain: No evidence of acute infarction, hemorrhage, hydrocephalus, extra-axial collection or mass lesion/mass effect. Cortical atrophy and mild chronic microvascular ischemic change are noted. Vascular: No hyperdense vessel or unexpected calcification. Skull: Normal. Negative for  fracture or focal lesion. Sinuses/Orbits: Negative. Other: Soft tissue contusion/laceration about the right side of the nose and eye noted. CT CERVICAL SPINE FINDINGS Alignment: Maintained with straightening of lordosis noted. Skull base and vertebrae: No acute fracture. No primary bone lesion or focal pathologic process. Soft tissues and spinal canal: No prevertebral fluid or swelling. No visible canal hematoma. Disc levels: Loss of disc space height and endplate spurring appear worst at C5-6 and C6-7. Scattered facet degenerative change also noted. Upper chest: Negative. Other: None. IMPRESSION: Laceration on the right side of the nose near the right eye without underlying fracture or acute intracranial abnormality. No acute abnormality cervical spine. Atrophy and mild chronic microvascular ischemic change. Cervical spondylosis. Electronically Signed   By: Inge Rise M.D.   On: 04/16/2018 13:25    Assessment & Plan:   Problem List Items Addressed This Visit    Cerumen impaction    Bilateral , on exam today.  RN to irrigate both ears          I am having Aquarius T. Selmon maintain his Artificial Tear Ointment (DRY EYES OP), atorvastatin, furosemide, metoprolol tartrate, warfarin,  traZODone, allopurinol, and traMADol.  No orders of the defined types were placed in this encounter.   There are no discontinued medications.  Follow-up: No follow-ups on file.   Crecencio Mc, MD

## 2018-10-01 NOTE — Patient Instructions (Signed)
I recommend using Debrox drops in your left ear at night .    3 drops , lie with left side up for at least 15 minutes.  Place cotton ball in ear before you go to bed

## 2018-10-01 NOTE — Assessment & Plan Note (Signed)
Bilateral , on exam today.  RN to irrigate both ears

## 2018-10-02 ENCOUNTER — Other Ambulatory Visit: Payer: Self-pay | Admitting: Internal Medicine

## 2018-10-25 ENCOUNTER — Ambulatory Visit (INDEPENDENT_AMBULATORY_CARE_PROVIDER_SITE_OTHER): Payer: Medicare Other

## 2018-10-25 DIAGNOSIS — I4819 Other persistent atrial fibrillation: Secondary | ICD-10-CM | POA: Diagnosis not present

## 2018-10-25 DIAGNOSIS — Z5181 Encounter for therapeutic drug level monitoring: Secondary | ICD-10-CM

## 2018-10-25 LAB — POCT INR: INR: 3.1 — AB (ref 2.0–3.0)

## 2018-10-25 NOTE — Patient Instructions (Signed)
Please take 1/2 tablet tonight, then continue dosage of 1 tablet every day.  Please be consistent w/ your greens intake.  Recheck in 5 weeks.

## 2018-11-10 ENCOUNTER — Telehealth: Payer: Self-pay

## 2018-11-10 MED ORDER — TRAZODONE HCL 50 MG PO TABS: 50.0000 mg | ORAL_TABLET | Freq: Every evening | ORAL | 1 refills | Status: DC | PRN

## 2018-11-10 NOTE — Addendum Note (Signed)
Addended by: Crecencio Mc on: 11/10/2018 12:51 PM   Modules accepted: Orders

## 2018-11-10 NOTE — Telephone Encounter (Signed)
Trazodone 50 mg daily sent to mail order,  If he needs to increase dose to 75 or 100 mg let me know

## 2018-11-10 NOTE — Telephone Encounter (Signed)
Copied from [place] [id number]. Topic: General - Other >> Nov 10, 2018  9:40 AM Carolyn Stare wrote:  Pt call to ask if the below med can be refilled ,he said he had req to go off the med but now he has changed his mine   traZODone ([place]) 50 MG tablet  [address]

## 2018-11-10 NOTE — Telephone Encounter (Signed)
Pt has changed his mind about coming off of the Trazodone. Is it okay to refill?

## 2018-11-10 NOTE — Addendum Note (Signed)
Addended by: Adair Laundry on: 11/10/2018 04:54 PM   Modules accepted: Orders

## 2018-11-12 MED ORDER — TRAMADOL HCL 50 MG PO TABS: 50.0000 mg | ORAL_TABLET | Freq: Every day | ORAL | 5 refills | Status: DC | PRN

## 2018-11-12 NOTE — Addendum Note (Signed)
Addended by: Adair Laundry on: 11/12/2018 11:40 AM   Modules accepted: Orders

## 2018-11-12 NOTE — Addendum Note (Signed)
Addended by: Crecencio Mc on: 11/12/2018 12:38 PM   Modules accepted: Orders

## 2018-11-12 NOTE — Telephone Encounter (Signed)
Called pt's son to let him know that the tramadol had been called in and when I spoke with the son he stated that it was his other brother Jeffery Reynolds that made the phone requesting the medication. I made I had premission per DPR to speak with Shawn(on DPR) before giving anymore information. So I explained to Shawn that the pt called and requested a refill of his trazodone and it was refilled and then his brother Jeffery Reynolds called back and stated that his father was confused and meant to ask for his Tramadol to be refilled. I explained to Shawn that the Tramadol was refilled and he stated that he was trying to get his father off of this medication because he was feeling so groggy all the time and he thought that it could have been from all the medication he was taking. Raquel Sarna stated that he was going to call his brother Jeffery Reynolds who lives with their father and see why the Tramadol was needing to be refilled.

## 2018-11-12 NOTE — Telephone Encounter (Signed)
Pt's son, Percell Miller,  calling.  States that pt was confused yesterday and he needs traMADol (ULTRAM) 50 MG tablet not Trazadone. Please send to  [id number], [place] [phone] (Phone) [phone] (Fax)

## 2018-11-12 NOTE — Telephone Encounter (Signed)
Please call the pharmacy and cancel the tramadol refill if it has not been filled

## 2018-11-12 NOTE — Telephone Encounter (Signed)
Refilled: 09/24/2018 Last OV: 10/01/2018 Next OV: 03/25/2019

## 2018-11-12 NOTE — Telephone Encounter (Signed)
Tramadol sent to [facility].

## 2018-11-12 NOTE — Telephone Encounter (Signed)
Spoke with the pharmacist and canceled the tramadol until we here from the pt's son.

## 2018-11-13 ENCOUNTER — Other Ambulatory Visit: Payer: Self-pay | Admitting: Internal Medicine

## 2018-11-15 NOTE — Telephone Encounter (Signed)
Waiting to hear from son re tramadol refil

## 2018-11-17 ENCOUNTER — Other Ambulatory Visit: Payer: Self-pay | Admitting: Internal Medicine

## 2018-11-17 MED ORDER — TRAMADOL HCL 50 MG PO TABS: 50.0000 mg | ORAL_TABLET | Freq: Every day | ORAL | 5 refills | Status: DC | PRN

## 2018-11-17 NOTE — Telephone Encounter (Signed)
Spoke with pt's son, Shawn(on DPR), and he stated that he spoke with his dad and brother after speaking with [place] the other day and they did decide that the pt does need this medication right now. Raquel Sarna stated that the pt has not been able to go to the gym due to Covid-19 and has started having some discomfort that is keeping him awake at night.

## 2018-11-17 NOTE — Telephone Encounter (Signed)
Tramadol refilled and sent to [facility]

## 2018-11-18 ENCOUNTER — Telehealth: Payer: Self-pay

## 2018-11-18 NOTE — Telephone Encounter (Signed)
Copied from [place] [id number]. Topic: Quick Communication - See Telephone Encounter >> Nov 18, 2018  3:30 PM Loma Boston wrote: CRM for notification. See Telephone encounter for: 11/18/18. PT 85 years called complaining of a large boil on his right buttock wanting removed,lanced, very painful contacted FC and CMA advised it should be ok. Appointment made for 3/27 at 9:30 but pt was advised that he will get a screening call or contact made otherwise.

## 2018-11-18 NOTE — Telephone Encounter (Signed)
Yes I will see him tomorrow morning

## 2018-11-18 NOTE — Telephone Encounter (Signed)
Is the pt ok to come in for an office visit?

## 2018-11-19 ENCOUNTER — Ambulatory Visit: Payer: Self-pay | Admitting: Internal Medicine

## 2018-11-19 ENCOUNTER — Other Ambulatory Visit: Payer: Self-pay

## 2018-11-19 ENCOUNTER — Ambulatory Visit (INDEPENDENT_AMBULATORY_CARE_PROVIDER_SITE_OTHER): Payer: Medicare Other | Admitting: Internal Medicine

## 2018-11-19 ENCOUNTER — Encounter: Payer: Self-pay | Admitting: Internal Medicine

## 2018-11-19 DIAGNOSIS — Z7901 Long term (current) use of anticoagulants: Secondary | ICD-10-CM

## 2018-11-19 DIAGNOSIS — Z5181 Encounter for therapeutic drug level monitoring: Secondary | ICD-10-CM | POA: Diagnosis not present

## 2018-11-19 DIAGNOSIS — L0232 Furuncle of buttock: Secondary | ICD-10-CM | POA: Insufficient documentation

## 2018-11-19 MED ORDER — HYDROCODONE-ACETAMINOPHEN 10-325 MG PO TABS: 1.0000 | ORAL_TABLET | Freq: Four times a day (QID) | ORAL | 0 refills | Status: DC | PRN

## 2018-11-19 MED ORDER — DOXYCYCLINE HYCLATE 100 MG PO TABS: 100.0000 mg | ORAL_TABLET | Freq: Two times a day (BID) | ORAL | 0 refills | Status: DC

## 2018-11-19 MED ORDER — LEVOFLOXACIN 500 MG PO TABS: 500.0000 mg | ORAL_TABLET | Freq: Every day | ORAL | 0 refills | Status: DC

## 2018-11-19 MED ORDER — HYDROCODONE-ACETAMINOPHEN 10-325 MG PO TABS: 1.0000 | ORAL_TABLET | Freq: Four times a day (QID) | ORAL | 0 refills | Status: AC | PRN

## 2018-11-19 NOTE — Patient Instructions (Addendum)
Your boil should start to drain now that I have punctured it.  DO NOT USE THE SALVE ANYMORE  APPLY HOT WASHCLOTH TO BUTTOCK EVERY FEW HOURS TO HELP IT DRAIN  START THE 2 ANTIBIOTICS TODAY.   LEVAQUIN ONCE DAILY FOR 10 DAYS  DOXYCYCLINE TWICE DAILY (WITH FOOD)  FOR TEN DAYS  TAKE A PROBIOTIC EVERY DAY FOR 3 WEEKS  Taking an antibiotic can create an imbalance in the normal population of bacteria that live in the small intestine.  This imbalance can persist for 3 months.   Taking a probiotic ( Align, Floraque or Culturelle), the generic version of one of these over the counter medications, or eat a serving of good quality yogurt  for a minimum of 3 weeks may help prevent a serious antibiotic associated diarrhea  Called clostridium dificile colitis that occurs when the bacteria population is altered .    USE THE VICODIN INSTEAD OF THE TRAMADOL FOR THE NEXT FEW DAYS FOR THE PAIN

## 2018-11-19 NOTE — Telephone Encounter (Signed)
Son calling in needing instructions on how to care for the boil that was lanced this morning.   He is suppose to put warm compresses on it 3 times a day.   When can he change the drsg?    Should he change the drsg yet?  I have sent this note to Dr Lupita Dawn office.   I let the son know someone would call him. He wants to be called on his cell phone [phone].    Reason for Disposition . [1] Caller has URGENT question AND [2] triager unable to answer question  Answer Assessment - Initial Assessment Questions 1. SYMPTOM: "What's the main symptom you're concerned about?" (e.g., redness, pain, drainage)     He was in the office this morning.   I'm trying to do a warm cloth on his rear end.   Do I change the drsg?  2. ONSET: "When did A boil  start?"     They lanced a boil this morning.   I supposed to put a warm compress on it to help it drain.    3. SURGERY: "What surgery was performed?"     This morning the boil was lanced. 4. DATE of SURGERY: "When was surgery performed?"      This morning. 5. INCISION SITE: "Where is the incision located?"      buttock 6. REDNESS: "Is there any redness at the incision site?" If yes, ask: "How wide across is the redness?" (Inches, centimeters)      No7. PAIN: "Is there any pain?" If so, ask: "How bad is it?"  (Scale 1-10; or mild, moderate, severe)     *No Answer* 8. BLEEDING: "Is there any bleeding?" If so, ask: "How much?" and "Where?"     No bleeding.   There is a drsg over it. 9. DRAINAGE: "Is there any drainage from the incision site?" If yes, ask: "What color and how much?" (e.g., red, cloudy, pus; drops, teaspoon)     *No Answer* 10. FEVER: "Do you have a fever?" If so, ask: "What is your temperature, how was it measured, and when did it start?"       *No Answer* 11. OTHER SYMPTOMS: "Do you have any other symptoms?" (e.g., shaking chills, weakness, rash elsewhere on body)       *No Answer*  Protocols used: POST-OP INCISION [facility]

## 2018-11-19 NOTE — Progress Notes (Signed)
Subjective:  Patient ID: Jeffery Reynolds, male    DOB: [birth year]  Age: 85 y.o. MRN: [id number]  CC: Diagnoses of Boil of buttock and Alteration in anticoagulation were pertinent to this visit.  HPI Jeffery Reynolds presents for ncision and drainage of a  boil on his buttock that has been present for 4 days .  He states that he had a smaller one several months ago that resolved without intervention  Denies fevers,  Chills, and cough,  But has had several loose stools in the last few days,   Has been applying some sort of "salve" on the area  For the  24 hours  The boil is not draining    Outpatient Medications Prior to Visit  Medication Sig Dispense Refill   allopurinol (ZYLOPRIM) 300 MG tablet TAKE 1 TABLET ON MONDAY, WEDNESDAY AND FRIDAY 12 tablet 0   Artificial Tear Ointment (DRY EYES OP) Apply 1 drop 3 (three) times daily to eye.     atorvastatin (LIPITOR) 40 MG tablet Take 1 tablet (40 mg total) by mouth daily. 90 tablet 1   furosemide (LASIX) 20 MG tablet TAKE 1 TABLET BY MOUTH EVERY OTHER DAY FOR FLUID RETENTION 45 tablet 2   metoprolol tartrate (LOPRESSOR) 50 MG tablet TAKE 1 TABLET(50 MG) BY MOUTH TWICE DAILY 180 tablet 1   traMADol (ULTRAM) 50 MG tablet Take 1 tablet (50 mg total) by mouth daily as needed. 30 tablet 5   traZODone (DESYREL) 50 MG tablet Take 1 tablet (50 mg total) by mouth at bedtime as needed for sleep. 90 tablet 1   warfarin (COUMADIN) 5 MG tablet TAKE 1 TABLET BY MOUTH DAILY OR AS DIRECTED BY COUMADIN CLINIC 90 tablet 1   No facility-administered medications prior to visit.     Review of Systems;  Patient denies headache, fevers, malaise, unintentional weight loss, skin rash, eye pain, sinus congestion and sinus pain, sore throat, dysphagia,  hemoptysis , cough, dyspnea, wheezing, chest pain, palpitations, orthopnea, edema, abdominal pain, nausea, melena, diarrhea, constipation, flank pain, dysuria, hematuria, urinary  Frequency, nocturia, numbness,  tingling, seizures,  Focal weakness, Loss of consciousness,  Tremor, insomnia, depression, anxiety, and suicidal ideation.     Objective:  BP 110/68 (BP Location: Left Arm, Patient Position: Sitting, Cuff Size: Large)    Pulse 86    Temp 97.6 F (36.4 C) (Oral)    Resp 15    Ht 5\' 8"  (1.727 m)    Wt 219 lb (99.3 kg)    SpO2 97%    BMI 33.30 kg/m   BP Readings from Last 3 Encounters:  11/19/18 110/68  10/01/18 102/64  09/24/18 110/68    Wt Readings from Last 3 Encounters:  11/19/18 219 lb (99.3 kg)  10/01/18 218 lb 12.8 oz (99.2 kg)  09/24/18 219 lb (99.3 kg)    General appearance: alert, cooperative and appears stated age Ears: normal TM's and external ear canals both ears Throat: lips, mucosa, and tongue normal; teeth and gums normal Neck: no adenopathy, no carotid bruit, supple, symmetrical, trachea midline and thyroid not enlarged, symmetric, no tenderness/mass/nodules Back: symmetric, no curvature. ROM normal. No CVA tenderness. Lungs: clear to auscultation bilaterally Heart: regular rate and rhythm, S1, S2 normal, no murmur, click, rub or gallop Abdomen: soft, non-tender; bowel sounds normal; no masses,  no organomegaly Pulses: 2+ and symmetric Skin:  RIGHT BUTTOCK WITH diffuse swelling and focal induration just lateral to the natal cleft .  Center of area is hyperemic and tender  and slightly fluctuant .  No rashes or lesions  Lymph nodes: Cervical, supraclavicular, and axillary nodes normal.  Lab Results  Component Value Date   HGBA1C 5.4 09/24/2018   HGBA1C 5.4 01/26/2017   HGBA1C 5.1 04/18/2014    Lab Results  Component Value Date   CREATININE 1.08 09/24/2018   CREATININE 1.21 04/27/2018   CREATININE 1.04 04/16/2018    Lab Results  Component Value Date   WBC 3.8 04/16/2018   HGB 13.4 04/16/2018   HCT 38.7 (L) 04/16/2018   PLT 83 (L) 04/16/2018   GLUCOSE 117 (H) 09/24/2018   CHOL 85 01/06/2018   TRIG 99.0 01/06/2018   HDL 38.50 (L) 01/06/2018   LDLDIRECT  45.0 09/11/2015   LDLCALC 26 01/06/2018   ALT 30 09/24/2018   AST 22 09/24/2018   NA 139 09/24/2018   K 4.0 09/24/2018   CL 104 09/24/2018   CREATININE 1.08 09/24/2018   BUN 16 09/24/2018   CO2 29 09/24/2018   TSH 2.43 09/11/2015   INR 3.1 (A) 10/25/2018   HGBA1C 5.4 09/24/2018    Ct Head Wo Contrast  Result Date: 04/16/2018 CLINICAL DATA:  Status post trip and fall today with a blow to the face. Initial encounter. EXAM: CT HEAD WITHOUT CONTRAST CT CERVICAL SPINE WITHOUT CONTRAST TECHNIQUE: Multidetector CT imaging of the head and cervical spine was performed following the standard protocol without intravenous contrast. Multiplanar CT image reconstructions of the cervical spine were also generated. COMPARISON:  Brain MRI 06/25/2017. FINDINGS: CT HEAD FINDINGS Brain: No evidence of acute infarction, hemorrhage, hydrocephalus, extra-axial collection or mass lesion/mass effect. Cortical atrophy and mild chronic microvascular ischemic change are noted. Vascular: No hyperdense vessel or unexpected calcification. Skull: Normal. Negative for fracture or focal lesion. Sinuses/Orbits: Negative. Other: Soft tissue contusion/laceration about the right side of the nose and eye noted. CT CERVICAL SPINE FINDINGS Alignment: Maintained with straightening of lordosis noted. Skull base and vertebrae: No acute fracture. No primary bone lesion or focal pathologic process. Soft tissues and spinal canal: No prevertebral fluid or swelling. No visible canal hematoma. Disc levels: Loss of disc space height and endplate spurring appear worst at C5-6 and C6-7. Scattered facet degenerative change also noted. Upper chest: Negative. Other: None. IMPRESSION: Laceration on the right side of the nose near the right eye without underlying fracture or acute intracranial abnormality. No acute abnormality cervical spine. Atrophy and mild chronic microvascular ischemic change. Cervical spondylosis. Electronically Signed   By: Inge Rise M.D.   On: 04/16/2018 13:25   Ct Cervical Spine Wo Contrast  Result Date: 04/16/2018 CLINICAL DATA:  Status post trip and fall today with a blow to the face. Initial encounter. EXAM: CT HEAD WITHOUT CONTRAST CT CERVICAL SPINE WITHOUT CONTRAST TECHNIQUE: Multidetector CT imaging of the head and cervical spine was performed following the standard protocol without intravenous contrast. Multiplanar CT image reconstructions of the cervical spine were also generated. COMPARISON:  Brain MRI 06/25/2017. FINDINGS: CT HEAD FINDINGS Brain: No evidence of acute infarction, hemorrhage, hydrocephalus, extra-axial collection or mass lesion/mass effect. Cortical atrophy and mild chronic microvascular ischemic change are noted. Vascular: No hyperdense vessel or unexpected calcification. Skull: Normal. Negative for fracture or focal lesion. Sinuses/Orbits: Negative. Other: Soft tissue contusion/laceration about the right side of the nose and eye noted. CT CERVICAL SPINE FINDINGS Alignment: Maintained with straightening of lordosis noted. Skull base and vertebrae: No acute fracture. No primary bone lesion or focal pathologic process. Soft tissues and spinal canal: No prevertebral fluid or swelling.  No visible canal hematoma. Disc levels: Loss of disc space height and endplate spurring appear worst at C5-6 and C6-7. Scattered facet degenerative change also noted. Upper chest: Negative. Other: None. IMPRESSION: Laceration on the right side of the nose near the right eye without underlying fracture or acute intracranial abnormality. No acute abnormality cervical spine. Atrophy and mild chronic microvascular ischemic change. Cervical spondylosis. Electronically Signed   By: Inge Rise M.D.   On: 04/16/2018 13:25    Assessment & Plan:   Problem List Items Addressed This Visit    Boil of buttock    Right buttock.  Patient requesting I &D.  Informed consent obtained.  Lidocaine with epi used given patient's chronic  anticoagulation .  Area cleaned with betadine and alcohol.  Several cruciform stab incisions made using scalpel but no drainage of pus was achieved.  Wound was dressed.  Patient was advised to change dressings frequently,  Use hot washcloths often.  Levaquin and doxycycline prescribed given proximity to perineum and report of diarrhea.  Probiotic advised.  Return precautions given      Alteration in anticoagulation    Patient takes warfarin for chronic anticoagulation,  In anticipation of elevated INR as a result of use of antibiotics., I have advised him in writing to skip his dose of warfarin on Saturday, Monday and Wednesday,  And to have his INR checked on Friday April 3.        Relevant Orders   Protime-INR      I am having Ritchie T. Ignatowski start on levofloxacin and doxycycline. I am also having him maintain his Artificial Tear Ointment (DRY EYES OP), atorvastatin, furosemide, metoprolol tartrate, warfarin, allopurinol, traZODone, traMADol, and HYDROcodone-acetaminophen.  Meds ordered this encounter  Medications   levofloxacin (LEVAQUIN) 500 MG tablet    Sig: Take 1 tablet (500 mg total) by mouth daily.    Dispense:  10 tablet    Refill:  0   doxycycline (VIBRA-TABS) 100 MG tablet    Sig: Take 1 tablet (100 mg total) by mouth 2 (two) times daily.    Dispense:  20 tablet    Refill:  0   DISCONTD: HYDROcodone-acetaminophen (NORCO) 10-325 MG tablet    Sig: Take 1 tablet by mouth every 6 (six) hours as needed for up to 5 days.    Dispense:  20 tablet    Refill:  0   HYDROcodone-acetaminophen (NORCO) 10-325 MG tablet    Sig: Take 1 tablet by mouth every 6 (six) hours as needed for up to 5 days.    Dispense:  20 tablet    Refill:  0    Medications Discontinued During This Encounter  Medication Reason   HYDROcodone-acetaminophen ([place]) 10-325 MG tablet     Follow-up: No follow-ups on file.   Crecencio Mc, MD

## 2018-11-19 NOTE — Telephone Encounter (Signed)
Attempted to call pt's son. No answer no voicemail.

## 2018-11-19 NOTE — Assessment & Plan Note (Addendum)
Right buttock.  Patient requesting I &D.  Informed consent obtained.  Lidocaine with epi used given patient's chronic anticoagulation .  Area cleaned with betadine and alcohol.  Several cruciform stab incisions made using scalpel but no drainage of pus was achieved.  Wound was dressed.  Patient was advised to change dressings frequently,  Use hot washcloths often.  Levaquin and doxycycline prescribed given proximity to perineum and report of diarrhea.  Probiotic advised.  Return precautions given

## 2018-11-19 NOTE — Assessment & Plan Note (Signed)
Patient takes warfarin for chronic anticoagulation,  In anticipation of elevated INR as a result of use of antibiotics., I have advised him in writing to skip his dose of warfarin on Saturday, Monday and Wednesday,  And to have his INR checked on Friday April 3.

## 2018-11-22 ENCOUNTER — Ambulatory Visit: Payer: Medicare Other | Admitting: Podiatry

## 2018-11-25 ENCOUNTER — Telehealth: Payer: Self-pay | Admitting: Cardiovascular Disease

## 2018-11-25 NOTE — Telephone Encounter (Signed)
If Home Health RN is calling please get Coumadin Nurse on the phone STAT  1.  Are you calling in regards to an appointment? Yes   2.  Are you calling for a refill ? No   3.  Are you having bleeding issues? no  4.  Do you need clearance to hold Coumadin? No   Requesting appt Friday to check coumadin while using ABX please call    Please route to the [place]

## 2018-11-25 NOTE — Telephone Encounter (Signed)
Returned call to pt and spoke with his son. He was started on a course of doxycycline 100mg  BID and levofloxacin 500mg  daily, both x10 days on 3/27 by Dr Derrel Nip. She told pt to skip his Coumadin on Saturday, Monday, and Wednesday and was told to have INR check on Friday. Pt now calling because he does not know what time to have his INR checked. Advised pt's son that in the future, we prefer him to call clinic as soon as he is started on new antibiotics as we ideally would have liked to have checked his INR sooner, and also would not have had him skip his Coumadin for 3 full days. He is agreeable to coming in to [facility] office tomorrow for INR check (canceled Monday visit in [place]). Pt aware of drive up service, will arrive at 11:15am, and pt and son were both prescreened for COVID. Will schedule f/u appt back in [place] after.   1. Do you currently have a fever? No 2. Have you recently travelled on a cruise, internationally, or to [place], [place], [place], [place], [place], or [place], [place] [employer]) ? No 3. Have you been in contact with someone that is currently pending confirmation of Covid19 testing or has been confirmed to have the [place] virus?  No 4. Are you currently experiencing fatigue or cough? No  Pt. Advised that we are restricting visitors at this time and anyone present in the vehicle should meet the above criteria as well. Advised that visit will be at curbside for finger stick ONLY and will receive call with instructions. Pt also advised to please bring own pen for signature of arrival document.

## 2018-11-26 ENCOUNTER — Other Ambulatory Visit: Payer: Self-pay

## 2018-11-26 ENCOUNTER — Ambulatory Visit (INDEPENDENT_AMBULATORY_CARE_PROVIDER_SITE_OTHER): Payer: Medicare Other | Admitting: Pharmacist

## 2018-11-26 DIAGNOSIS — I4819 Other persistent atrial fibrillation: Secondary | ICD-10-CM

## 2018-11-26 DIAGNOSIS — Z5181 Encounter for therapeutic drug level monitoring: Secondary | ICD-10-CM

## 2018-11-26 LAB — POCT INR: INR: 2 (ref 2.0–3.0)

## 2018-11-29 ENCOUNTER — Encounter: Payer: Self-pay | Admitting: Cardiovascular Disease

## 2018-11-29 ENCOUNTER — Telehealth: Payer: Self-pay

## 2018-11-29 NOTE — Telephone Encounter (Signed)
Copied from [place] [id number]. Topic: General - Other >> Nov 29, 2018  9:35 AM Antonieta Iba C wrote: Reason for CRM: pt says that he had an apt in office to have his coumadin checked. Pt says that he went to a different office and had it done. His result was 2.0. pt says that he would like to speak with PCP because he has a question.

## 2018-11-29 NOTE — Telephone Encounter (Signed)
This encounter was created in error - please disregard.

## 2018-11-30 NOTE — Telephone Encounter (Signed)
Yes,  Cardiology manages his coumadin.  His INR is therapeutic at 2.0

## 2018-11-30 NOTE — Telephone Encounter (Signed)
LMTCB

## 2018-11-30 NOTE — Telephone Encounter (Signed)
Doesn't the coumadin clinic follow his INR?

## 2018-12-01 NOTE — Telephone Encounter (Signed)
LMTCB. Please transfer pt to our office.  

## 2019-01-07 ENCOUNTER — Telehealth: Payer: Self-pay

## 2019-01-07 NOTE — Telephone Encounter (Signed)
Attempted to contact pt to prescreen for COVID19 prior to INR check on Monday, May 18. Left message on vm reminding pt of appt @ drive thru in front of [place].  Asked him to call back if he will be unable to keep this appt, if he has developed a fever, travelled out of state since his last visit or been in contact w/ anyone w/ the virus.

## 2019-01-10 ENCOUNTER — Ambulatory Visit (INDEPENDENT_AMBULATORY_CARE_PROVIDER_SITE_OTHER): Payer: Medicare Other

## 2019-01-10 ENCOUNTER — Other Ambulatory Visit: Payer: Self-pay

## 2019-01-10 DIAGNOSIS — Z5181 Encounter for therapeutic drug level monitoring: Secondary | ICD-10-CM

## 2019-01-10 DIAGNOSIS — I4819 Other persistent atrial fibrillation: Secondary | ICD-10-CM

## 2019-01-10 LAB — POCT INR: INR: 2.9 (ref 2.0–3.0)

## 2019-01-10 NOTE — Patient Instructions (Signed)
Continue dosage of 1 tablet every day. Please be consistent w/ your greens intake.  Recheck in 6 weeks.

## 2019-01-12 ENCOUNTER — Other Ambulatory Visit: Payer: Self-pay

## 2019-01-13 ENCOUNTER — Ambulatory Visit (INDEPENDENT_AMBULATORY_CARE_PROVIDER_SITE_OTHER): Payer: Medicare Other

## 2019-01-13 ENCOUNTER — Ambulatory Visit (INDEPENDENT_AMBULATORY_CARE_PROVIDER_SITE_OTHER): Payer: Medicare Other | Admitting: Family Medicine

## 2019-01-13 ENCOUNTER — Encounter: Payer: Self-pay | Admitting: Family Medicine

## 2019-01-13 VITALS — BP 112/68 | HR 104 | Temp 97.9°F | Resp 16 | Ht 68.0 in | Wt 222.2 lb

## 2019-01-13 DIAGNOSIS — M545 Low back pain, unspecified: Secondary | ICD-10-CM

## 2019-01-13 DIAGNOSIS — M47816 Spondylosis without myelopathy or radiculopathy, lumbar region: Secondary | ICD-10-CM | POA: Diagnosis not present

## 2019-01-13 DIAGNOSIS — M1711 Unilateral primary osteoarthritis, right knee: Secondary | ICD-10-CM | POA: Diagnosis not present

## 2019-01-13 DIAGNOSIS — Z7901 Long term (current) use of anticoagulants: Secondary | ICD-10-CM | POA: Diagnosis not present

## 2019-01-13 DIAGNOSIS — G8929 Other chronic pain: Secondary | ICD-10-CM

## 2019-01-13 DIAGNOSIS — M25561 Pain in right knee: Secondary | ICD-10-CM

## 2019-01-13 DIAGNOSIS — R609 Edema, unspecified: Secondary | ICD-10-CM | POA: Diagnosis not present

## 2019-01-13 DIAGNOSIS — I482 Chronic atrial fibrillation, unspecified: Secondary | ICD-10-CM | POA: Diagnosis not present

## 2019-01-13 NOTE — Patient Instructions (Signed)
Arthritis  Arthritis means joint pain. It can also mean joint disease. A joint is a place where bones come together. People who have arthritis may have:  · Red joints.  · Swollen joints.  · Stiff joints.  · Warm joints.  · A fever.  · A feeling of being sick.  Follow these instructions at home:  Pay attention to any changes in your symptoms. Take these actions to help with your pain and swelling.  Medicines  · Take over-the-counter and prescription medicines only as told by your doctor.  · Do not take aspirin for pain if your doctor says that you may have gout.  Activity  · Rest your joint if your doctor tells you to.  · Avoid activities that make the pain worse.  · Exercise your joint regularly as told by your doctor. Try doing exercises like:  ? Swimming.  ? Water aerobics.  ? Biking.  ? Walking.  Joint Care    · If your joint is swollen, keep it raised (elevated) if told by your doctor.  · If your joint feels stiff in the morning, try taking a warm shower.  · If you have diabetes, do not apply heat without asking your doctor.  · If told, apply heat to the joint:  ? Put a towel between the joint and the hot pack or heating pad.  ? Leave the heat on the area for 20-30 minutes.  · If told, apply ice to the joint:  ? Put ice in a plastic bag.  ? Place a towel between your skin and the bag.  ? Leave the ice on for 20 minutes, 2-3 times per day.  · Keep all follow-up visits as told by your doctor.  Contact a doctor if:  · The pain gets worse.  · You have a fever.  Get help right away if:  · You have very bad pain in your joint.  · You have swelling in your joint.  · Your joint is red.  · Many joints become painful and swollen.  · You have very bad back pain.  · Your leg is very weak.  · You cannot control your pee (urine) or poop (stool).  This information is not intended to replace advice given to you by your health care provider. Make sure you discuss any questions you have with your health care provider.  Document  Released: 11/05/2009 Document Revised: 01/17/2016 Document Reviewed: 11/06/2014  Elsevier Interactive Patient Education © 2019 [employer] Inc[employer]

## 2019-01-13 NOTE — Progress Notes (Signed)
Subjective:    Patient ID: Jeffery Reynolds, male    DOB: August 18, 1934, 85 y.o.   MRN: [id number]  HPI   Patient presents to clinic complaining of some swelling in his right leg.  Patient states he has noticed this off and on for a year.  Also complains of pain in right knee.  He has had 2 knee replacements and left knee, so wonders if the right knee is arthritic as well.  Also complains of low back pain, states at times he will move a certain way and the pain will catch and send a shooting pain up his spine.  States the pain can be bad especially at night when trying to go to sleep and he will try different things to help the pain calm down and allow him to get some rest.  Most recently he has been using a hemp cream and someone else has suggested a CBD type cream to him as well.  He has been using the hemp cream now for about 2 months, does find it somewhat effective and many nights he is able to get good rest.  He also does have prescription for tramadol, usually will take 1 tramadol daily and for the most part that tends to keep his pain under decent control.  Denies any new injury or falls.  States when he takes his water pill (has Rx for Lasix every other day) this keeps his swelling in his legs under control.  Patient also is on Coumadin due to history of A. Fib. Denies any long trip recently where he had to be seated for extended period of time.   Patient Active Problem List   Diagnosis Date Noted  . Boil of buttock 11/19/2018  . Alteration in anticoagulation 11/19/2018  . Cerumen impaction 10/01/2018  . Insomnia due to anxiety and fear 04/28/2018  . Worsening vision 04/28/2018  . Impaired fasting glucose 10/19/2016  . Urinary hesitancy 10/18/2016  . Osteoarthritis of carpometacarpal [facility]) joint of right thumb 08/02/2016  . Preoperative general physical examination 01/31/2016  . Venous stasis of both lower extremities 01/31/2016  . History of cerebrovascular disease 07/31/2015  .  Encounter for preventive health examination 07/31/2015  . Dysplastic nevus of neck 06/13/2015  . Obesity 06/13/2015  . Mild cognitive impairment 04/13/2015  . Chronic atrial fibrillation 03/16/2015  . Left shoulder pain 11/03/2014  . Obstructive sleep apnea 09/14/2014  . Thrombocytopenia ([place]) 08/17/2014  . Constipation 08/15/2014  . Medicare annual wellness visit, subsequent 08/15/2014  . Tubular adenoma of colon 08/14/2014  . Chronic pain of left knee 06/17/2014  . Chronic dryness of both eyes 06/14/2014  . S/P left unicompartmental knee replacement 06/02/2014  . Seasonal allergies 12/18/2013  . Arthritis 12/18/2013  . H/O: gout 12/18/2013  . Encounter for therapeutic drug monitoring 12/14/2013  . Coronary artery disease   . Hyperlipidemia   . Essential hypertension    Social History   Tobacco Use  . Smoking status: Never Smoker  . Smokeless tobacco: Never Used  Substance Use Topics  . Alcohol use: Yes    Comment: occasional    Review of Systems  Constitutional: Negative for chills, fatigue and fever.  HENT: Negative for congestion, ear pain, sinus pain and sore throat.   Eyes: Negative.   Respiratory: Negative for cough, shortness of breath and wheezing.   Cardiovascular: Negative for chest pain, palpitations. Some leg swelling, right more than left.   Gastrointestinal: Negative for abdominal pain, diarrhea, nausea and vomiting.  Genitourinary:  Negative for dysuria, frequency and urgency.  Musculoskeletal: +right knee pain, low back pain Skin: Negative for color change, pallor and rash.  Neurological: Negative for syncope, light-headedness and headaches.  Psychiatric/Behavioral: The patient is not nervous/anxious.       Objective:   Physical Exam Vitals signs and nursing note reviewed.  Constitutional:      General: He is not in acute distress.    Appearance: He is not ill-appearing, toxic-appearing or diaphoretic.  HENT:     Head: Normocephalic and atraumatic.   Eyes:     General: No scleral icterus.    Extraocular Movements: Extraocular movements intact.     Pupils: Pupils are equal, round, and reactive to light.  Neck:     Musculoskeletal: Neck supple. No neck rigidity.  Cardiovascular:     Rate and Rhythm: Normal rate and regular rhythm.  Pulmonary:     Effort: Pulmonary effort is normal. No respiratory distress.     Breath sounds: Normal breath sounds.  Musculoskeletal:     Comments: Trace edema bilat LEs.  Scar on left knee from replacment. 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  ROM of knees intact. Negative anterior/posterior drawer test. No crepitus with ROM No pain with palpation of low back. Negative straight leg raises.   Skin:    General: Skin is warm and dry.     Coloration: Skin is not jaundiced.     Findings: No erythema.  Neurological:     Mental Status: He is alert and oriented to person, place, and time.     Gait: Gait normal.  Psychiatric:        Mood and Affect: Mood normal.        Behavior: Behavior normal.    Vitals:   01/13/19 1052  BP: 112/68  Pulse: (!) 104  Resp: 16  Temp: 97.9 F (36.6 C)  SpO2: 93%      Assessment & Plan:    A total of 25  minutes were spent face-to-face with the patient during this encounter and over half of that time was spent on counseling and coordination of care. The patient was counseled on causes of arthritis, treatments, plan of care.   Pain of right knee, low back pain-we will get x-ray of both right knee and low back in clinic today.  Suspect patient does have arthritis both the knee and back and that would explain his pain.  Long discussion with patient regards to use of hemp cream and CBD oil/creams.  Advised patient that I can neither confirm nor do denie their effectiveness or safety at this time due to there not being enough scientic data/studies on these things. He can use tramadol and tylenol as needed for pain. Also discussed gentle ROM exercises and  topical Aspercreme or Biofreeze as other pain options  LE swelling, Afib, on coumadin -- the LE swelling is very mild, he is on lasix for this, it is a chronic issue. He is anticoagulated on coumadin. I do not see need for doppler study due to the leg swelling being chronic. Advised to elevate legs when sitting ans consider wearing tall compression style socks.  XRAYs done in office  He will otherwise keep follow up with PCP as planned. Advised he can RTC sooner if needed.

## 2019-01-14 ENCOUNTER — Telehealth: Payer: Self-pay | Admitting: Cardiovascular Disease

## 2019-01-14 NOTE — Telephone Encounter (Signed)
Pt son would like to know if it is ok if patient takes CBD oil and if this will affect his coumadin levels.

## 2019-01-14 NOTE — Telephone Encounter (Signed)
Returned call to pt's son, pt is in pain from knees wishes to try CBD oil under his tongue.  Advised pt's son CBD oil can increase risk of bleeding, if pt decides to experiment with CBD oil will need monitor for bleeding.  Pt's son verbalized understanding.

## 2019-01-20 ENCOUNTER — Other Ambulatory Visit: Payer: Self-pay | Admitting: Internal Medicine

## 2019-01-21 NOTE — Progress Notes (Unsigned)
PT and ortho referrals in

## 2019-01-26 ENCOUNTER — Telehealth: Payer: Self-pay | Admitting: Cardiovascular Disease

## 2019-01-26 NOTE — Telephone Encounter (Signed)
Virtual Visit Pre-Appointment Phone Call  "(Name), I am calling you today to discuss your upcoming appointment. We are currently trying to limit exposure to the virus that causes COVID-19 by seeing patients at home rather than in the office."  1. "What is the BEST phone number to call the day of the visit?" - include this in appointment notes  2. Do you have or have access to (through a family member/friend) a smartphone with video capability that we can use for your visit?" a. If yes - list this number in appt notes as cell (if different from BEST phone #) and list the appointment type as a VIDEO visit in appointment notes b. If no - list the appointment type as a PHONE visit in appointment notes  3. Confirm consent - "In the setting of the current Covid19 crisis, you are scheduled for a (phone or video) visit with your provider on (date) at (time).  Just as we do with many in-office visits, in order for you to participate in this visit, we must obtain consent.  If you'd like, I can send this to your mychart (if signed up) or email for you to review.  Otherwise, I can obtain your verbal consent now.  All virtual visits are billed to your insurance company just like a normal visit would be.  By agreeing to a virtual visit, we'd like you to understand that the technology does not allow for your provider to perform an examination, and thus may limit your provider's ability to fully assess your condition. If your provider identifies any concerns that need to be evaluated in person, we will make arrangements to do so.  Finally, though the technology is pretty good, we cannot assure that it will always work on either your or our end, and in the setting of a video visit, we may have to convert it to a phone-only visit.  In either situation, we cannot ensure that we have a secure connection.  Are you willing to proceed?" STAFF: Did the patient verbally acknowledge consent to telehealth visit? Document  YES/NO here: YES  4. Advise patient to be prepared - "Two hours prior to your appointment, go ahead and check your blood pressure, pulse, oxygen saturation, and your weight (if you have the equipment to check those) and write them all down. When your visit starts, your provider will ask you for this information. If you have an Apple Watch or Kardia device, please plan to have heart rate information ready on the day of your appointment. Please have a pen and paper handy nearby the day of the visit as well."  5. Give patient instructions for MyChart download to smartphone OR Doximity/Doxy.me as below if video visit (depending on what platform provider is using)  6. Inform patient they will receive a phone call 15 minutes prior to their appointment time (may be from unknown caller ID) so they should be prepared to answer    TELEPHONE CALL NOTE  Jeffery Reynolds has been deemed a candidate for a follow-up tele-health visit to limit community exposure during the Covid-19 pandemic. I spoke with the patient via phone to ensure availability of phone/video source, confirm preferred email & phone number, and discuss instructions and expectations.  I reminded Jeffery Reynolds to be prepared with any vital sign and/or heart rhythm information that could potentially be obtained via home monitoring, at the time of his visit. I reminded Jeffery Reynolds to expect a phone call prior to  his visit.  Ace Gins 01/26/2019 10:34 AM   INSTRUCTIONS FOR DOWNLOADING THE MYCHART APP TO SMARTPHONE  - The patient must first make sure to have activated MyChart and know their login information - If Apple, go to [employer] and type in MyChart in the search bar and download the app. If Android, ask patient to go to [employer] and type in [place] in the search bar and download the app. The app is free but as with any other app downloads, their phone may require them to verify saved payment information or  Apple/Android password.  - The patient will need to then log into the app with their MyChart username and password, and select [facility] as their healthcare provider to link the account. When it is time for your visit, go to the MyChart app, find appointments, and click Begin Video Visit. Be sure to Select Allow for your device to access the Microphone and Camera for your visit. You will then be connected, and your provider will be with you shortly.  **If they have any issues connecting, or need assistance please contact MyChart service desk [phone])**  **If using a computer, in order to ensure the best quality for their visit they will need to use either of the following Internet Browsers: [employer], or Google Chrome**  IF USING DOXIMITY or DOXY.ME - The patient will receive a link just prior to their visit by text.     FULL LENGTH CONSENT FOR TELE-HEALTH VISIT   I hereby voluntarily request, consent and authorize [facility] and its employed or contracted physicians, physician assistants, nurse practitioners or other licensed health care professionals (the Practitioner), to provide me with telemedicine health care services (the Services") as deemed necessary by the treating Practitioner. I acknowledge and consent to receive the Services by the Practitioner via telemedicine. I understand that the telemedicine visit will involve communicating with the Practitioner through live audiovisual communication technology and the disclosure of certain medical information by electronic transmission. I acknowledge that I have been given the opportunity to request an in-person assessment or other available alternative prior to the telemedicine visit and am voluntarily participating in the telemedicine visit.  I understand that I have the right to withhold or withdraw my consent to the use of telemedicine in the course of my care at any time, without affecting my right to future care  or treatment, and that the Practitioner or I may terminate the telemedicine visit at any time. I understand that I have the right to inspect all information obtained and/or recorded in the course of the telemedicine visit and may receive copies of available information for a reasonable fee.  I understand that some of the potential risks of receiving the Services via telemedicine include:   Delay or interruption in medical evaluation due to technological equipment failure or disruption;  Information transmitted may not be sufficient (e.g. poor resolution of images) to allow for appropriate medical decision making by the Practitioner; and/or   In rare instances, security protocols could fail, causing a breach of personal health information.  Furthermore, I acknowledge that it is my responsibility to provide information about my medical history, conditions and care that is complete and accurate to the best of my ability. I acknowledge that Practitioner's advice, recommendations, and/or decision may be based on factors not within their control, such as incomplete or inaccurate data provided by me or distortions of diagnostic images or specimens that may result from electronic transmissions. I understand  that the practice of medicine is not an exact science and that Practitioner makes no warranties or guarantees regarding treatment outcomes. I acknowledge that I will receive a copy of this consent concurrently upon execution via email to the email address I last provided but may also request a printed copy by calling the office of [facility].    I understand that my insurance will be billed for this visit.   I have read or had this consent read to me.  I understand the contents of this consent, which adequately explains the benefits and risks of the Services being provided via telemedicine.   I have been provided ample opportunity to ask questions regarding this consent and the Services and have had  my questions answered to my satisfaction.  I give my informed consent for the services to be provided through the use of telemedicine in my medical care  By participating in this telemedicine visit I agree to the above.

## 2019-02-05 ENCOUNTER — Other Ambulatory Visit: Payer: Self-pay | Admitting: Internal Medicine

## 2019-02-18 ENCOUNTER — Telehealth: Payer: Self-pay

## 2019-02-18 NOTE — Telephone Encounter (Signed)

## 2019-02-21 ENCOUNTER — Ambulatory Visit (INDEPENDENT_AMBULATORY_CARE_PROVIDER_SITE_OTHER): Payer: Medicare Other

## 2019-02-21 ENCOUNTER — Other Ambulatory Visit: Payer: Self-pay

## 2019-02-21 DIAGNOSIS — Z5181 Encounter for therapeutic drug level monitoring: Secondary | ICD-10-CM

## 2019-02-21 DIAGNOSIS — I4819 Other persistent atrial fibrillation: Secondary | ICD-10-CM | POA: Diagnosis not present

## 2019-02-21 LAB — POCT INR: INR: 3 (ref 2.0–3.0)

## 2019-02-21 NOTE — Patient Instructions (Signed)
Please have a serving of greens today and continue dosage of 1 tablet every day. Please be consistent w/ your greens intake.  Recheck in 6 weeks.

## 2019-03-03 ENCOUNTER — Other Ambulatory Visit: Payer: Self-pay | Admitting: Internal Medicine

## 2019-03-08 ENCOUNTER — Telehealth (INDEPENDENT_AMBULATORY_CARE_PROVIDER_SITE_OTHER): Payer: Medicare Other | Admitting: Cardiovascular Disease

## 2019-03-08 ENCOUNTER — Encounter: Payer: Self-pay | Admitting: Cardiovascular Disease

## 2019-03-08 ENCOUNTER — Other Ambulatory Visit: Payer: Self-pay

## 2019-03-08 VITALS — BP 114/66 | HR 68 | Ht 68.0 in | Wt 215.0 lb

## 2019-03-08 DIAGNOSIS — I482 Chronic atrial fibrillation, unspecified: Secondary | ICD-10-CM

## 2019-03-08 DIAGNOSIS — I10 Essential (primary) hypertension: Secondary | ICD-10-CM

## 2019-03-08 DIAGNOSIS — I2510 Atherosclerotic heart disease of native coronary artery without angina pectoris: Secondary | ICD-10-CM

## 2019-03-08 DIAGNOSIS — E782 Mixed hyperlipidemia: Secondary | ICD-10-CM

## 2019-03-08 NOTE — Patient Instructions (Signed)
Medication Instructions:  Continue same medications If you need a refill on your cardiac medications before your next appointment, please call your pharmacy.   Lab work: None If you have labs (blood work) drawn today and your tests are completely normal, you will receive your results only by: . MyChart Message (if you have MyChart) OR . A paper copy in the mail If you have any lab test that is abnormal or we need to change your treatment, we will call you to review the results.  Testing/Procedures: None  Follow-Up: At [facility], you and your health needs are our priority.  As part of our continuing mission to provide you with exceptional heart care, we have created designated Provider Care Teams.  These Care Teams include your primary Cardiologist (physician) and Advanced Practice Providers (APPs -  Physician Assistants and Nurse Practitioners) who all work together to provide you with the care you need, when you need it. You will need a follow up appointment in 6 months.  Please call our office 2 months in advance to schedule this appointment.  You may see Muhammad Arida, MD or one of the following Advanced Practice Providers on your designated Care Team:   Christopher Berge, NP Ryan Dunn, PA-C . Jacquelyn Visser, PA-C   

## 2019-03-08 NOTE — Progress Notes (Signed)
Virtual Visit via Telephone Note   This visit type was conducted due to national recommendations for restrictions regarding the COVID-19 Pandemic (e.g. social distancing) in an effort to limit this patient's exposure and mitigate transmission in our community.  Due to his co-morbid illnesses, this patient is at least at moderate risk for complications without adequate follow up.  This format is felt to be most appropriate for this patient at this time.  The patient did not have access to video technology/had technical difficulties with video requiring transitioning to audio format only (telephone).  All issues noted in this document were discussed and addressed.  No physical exam could be performed with this format.  Please refer to the patient's chart for his  consent to telehealth for [facility].   Date:  03/08/2019   ID:  Jeffery Reynolds, DOB 10/08/1933, MRN [id number]  Patient Location: Home Provider Location: Office  PCP:  Crecencio Mc, MD  Cardiologist:  Kathlyn Sacramento, MD  Electrophysiologist:  None   Evaluation Performed:  Follow-Up Visit  Chief Complaint: Doing well with no complaints  History of Present Illness:    Jeffery Reynolds is a 85 y.o. male was reached via phone for a follow-up visit regarding coronary artery disease and chronic atrial fibrillation. He is status post 6 vessel CABG in 1995 in [place]. He is being treated with rate control and anticoagulation for atrial fibrillation. He has been doing well with no recent chest pain, shortness of breath or palpitations.  He takes his medications regularly.  The patient does not have symptoms concerning for COVID-19 infection (fever, chills, cough, or new shortness of breath).    Past Medical History:  Diagnosis Date  . Arthritis   . CAD (coronary artery disease)    a. s/p 6-V CABG in [place] in 1995; b. MV 4/12: mild intensity, fixed perfusion defect involving the basal to mid anterior wall consistent with  injury to the diagonal distribution (this finding was also noted on prior study dating back to 2008). Overall, no ischemia was noted and there was no high-risk prognostic indicators based on this study. EF 63%  . Carotid artery disease ([place])    a. ultrasound 12/16: 1-39% bilateral ICA stenosis  . Chronic atrial fibrillation 2000   a. CHADS2VASc => 4 (HTN, age x 2, vascular disease); b. on Coumadin [facility]  . Chronic back pain   . Cognitive impairment   . Colon polyps   . Diastolic dysfunction    a. TTE 4/10: EF of 72%, mild diastolic dysfunction, no significant valvular abnormalities, mild ascending aortic root dilatation, moderate left atrial enlargement, technically difficult study  . Genital warts   . GERD (gastroesophageal reflux disease)   . Gout    Controlled with Allopurinol  . History of blood transfusion   . History of chicken pox   . Hyperlipidemia   . Hypertension   . Seasonal allergies   . Sleep apnea, obstructive   . 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 CARDIAC CATHETERIZATION    . CORONARY ARTERY BYPASS GRAFT  1995   CABG x 6 in [place], [place]    . GYNECOMASTIA EXCISION    . KNEE SURGERY     bilateral   . ROTATOR CUFF REPAIR     right  . TONSILLECTOMY AND ADENOIDECTOMY  1943     Current Meds  Medication Sig  . allopurinol (ZYLOPRIM) 300 MG tablet  TAKE 1 TABLET ON MONDAY, [place]  . Artificial Tear Ointment (DRY EYES OP) Apply 1 drop 3 (three) times daily to eye.  Marland Kitchen atorvastatin (LIPITOR) 40 MG tablet Take 1 tablet (40 mg total) by mouth daily.  . furosemide (LASIX) 20 MG tablet TAKE 1 TABLET BY MOUTH EVERY OTHER DAY FOR FLUID RETENTION  . metoprolol tartrate (LOPRESSOR) 50 MG tablet TAKE 1 TABLET(50 MG) BY MOUTH TWICE DAILY  . traMADol (ULTRAM) 50 MG tablet Take 1 tablet (50 mg total) by mouth daily as needed.  . traZODone (DESYREL) 50 MG tablet Take 1 tablet (50 mg  total) by mouth at bedtime as needed for sleep.  Marland Kitchen warfarin (COUMADIN) 5 MG tablet TAKE 1 TABLET BY MOUTH DAILY OR AS DIRECTED BY COUMADIN CLINIC     Allergies:   Patient has no known allergies.   Social History   Tobacco Use  . Smoking status: Never Smoker  . Smokeless tobacco: Never Used  Substance Use Topics  . Alcohol use: Yes    Comment: occasional   . Drug use: No     Family Hx: The patient's family history includes Alcohol abuse in his father, maternal grandfather, paternal grandfather, son, and son; Cancer in his brother and mother; Crohn's disease in his daughter; Dementia in his brother; Diabetes in his brother; Heart attack (age of onset: 3) in his father; Heart attack (age of onset: 35) in his paternal grandfather; Heart attack (age of onset: 36) in his paternal grandmother; Heart disease in his father; Hyperlipidemia in his father; Hypertension in his father.  ROS:   Please see the history of present illness.     All other systems reviewed and are negative.   Prior CV studies:   The following studies were reviewed today:    Labs/Other Tests and Data Reviewed:    EKG:  No ECG reviewed.  Recent Labs: 04/16/2018: Hemoglobin 13.4; Platelets 83 09/24/2018: ALT 30; BUN 16; Creatinine, Ser 1.08; Potassium 4.0; Sodium 139   Recent Lipid Panel Lab Results  Component Value Date/Time   CHOL 85 01/06/2018 09:22 AM   CHOL 137 03/24/2014 10:48 AM   TRIG 99.0 01/06/2018 09:22 AM   HDL 38.50 (L) 01/06/2018 09:22 AM   HDL 42 03/24/2014 10:48 AM   CHOLHDL 2 01/06/2018 09:22 AM   LDLCALC 26 01/06/2018 09:22 AM   LDLCALC 59 03/24/2014 10:48 AM   LDLDIRECT 45.0 09/11/2015 10:24 AM    Wt Readings from Last 3 Encounters:  03/08/19 215 lb (97.5 kg)  01/13/19 222 lb 3.2 oz (100.8 kg)  11/19/18 219 lb (99.3 kg)     Objective:    Vital Signs:  BP 114/66   Pulse 68   Ht 5\' 8"  (1.727 m)   Wt 215 lb (97.5 kg)   BMI 32.69 kg/m    VITAL SIGNS:  reviewed  ASSESSMENT &  PLAN:     1.  Chronic atrial fibrillation:  He is doing well overall with reasonably controlled ventricular rate. He appears to be asymptomatic. He is tolerating anticoagulation with warfarin. He does have thrombocytopenia that has been stable at 83,000.  2. Coronary artery disease involving native coronary arteries without angina: Continue medical therapy.  3. Essential hypertension: Blood pressure is controlled on current medications.  4. Hyperlipidemia: Continue treatment with atorvastatin. Most recent LDL was 26.   COVID-19 Education: The signs and symptoms of COVID-19 were discussed with the patient and how to seek care for testing (follow up with PCP or arrange E-visit).  The importance of social distancing was discussed today.  Time:   Today, I have spent 10 minutes with the patient with telehealth technology discussing the above problems.     Medication Adjustments/Labs and Tests Ordered: Current medicines are reviewed at length with the patient today.  Concerns regarding medicines are outlined above.   Tests Ordered: No orders of the defined types were placed in this encounter.   Medication Changes: No orders of the defined types were placed in this encounter.   Follow Up:  In Person in 6 month(s)  Signed, Kathlyn Sacramento, MD  03/08/2019 4:35 PM    [place]

## 2019-03-25 ENCOUNTER — Telehealth: Payer: Self-pay | Admitting: *Deleted

## 2019-03-25 ENCOUNTER — Other Ambulatory Visit: Payer: Self-pay

## 2019-03-25 ENCOUNTER — Ambulatory Visit (INDEPENDENT_AMBULATORY_CARE_PROVIDER_SITE_OTHER): Payer: Medicare Other | Admitting: Internal Medicine

## 2019-03-25 ENCOUNTER — Encounter: Payer: Self-pay | Admitting: Internal Medicine

## 2019-03-25 DIAGNOSIS — G8929 Other chronic pain: Secondary | ICD-10-CM | POA: Diagnosis not present

## 2019-03-25 DIAGNOSIS — G3184 Mild cognitive impairment, so stated: Secondary | ICD-10-CM

## 2019-03-25 DIAGNOSIS — E782 Mixed hyperlipidemia: Secondary | ICD-10-CM

## 2019-03-25 DIAGNOSIS — M25562 Pain in left knee: Secondary | ICD-10-CM | POA: Diagnosis not present

## 2019-03-25 DIAGNOSIS — I10 Essential (primary) hypertension: Secondary | ICD-10-CM | POA: Diagnosis not present

## 2019-03-25 MED ORDER — TRAMADOL HCL 50 MG PO TABS: 50.0000 mg | ORAL_TABLET | Freq: Two times a day (BID) | ORAL | 5 refills | Status: DC

## 2019-03-25 NOTE — Telephone Encounter (Signed)
Do you have a packet from [place] for pt. I have not seen anything from DSS.

## 2019-03-25 NOTE — Telephone Encounter (Signed)
Copied from [place] [id number]. Topic: General - Other >> Mar 25, 2019  4:34 PM Wynetta Emery, [place] C wrote: Reason for CRM: Angelic with  [place] [facility] called in to follow up on the status of a packet that she sent over.   CB: [phone]

## 2019-03-25 NOTE — Telephone Encounter (Signed)
I have not received anything from DSS

## 2019-03-25 NOTE — Progress Notes (Signed)
Telephone Note  This visit type was conducted due to national recommendations for restrictions regarding the COVID-19 pandemic (e.g. social distancing).  This format is felt to be most appropriate for this patient at this time.  All issues noted in this document were discussed and addressed.  No physical exam was performed (except for noted visual exam findings with Video Visits).   I connected with@ on 03/25/19 at 10:00 AM EDT by  telephone and verified that I am speaking with the correct person using two identifiers. Location patient: home Location provider: work or home office Persons participating in the virtual visit: patient, provider and son   I discussed the limitations, risks, security and privacy concerns of performing an evaluation and management service by telephone and the availability of in person appointments. I also discussed with the patient that there may be a patient responsible charge related to this service. The patient expressed understanding and agreed to proceed.  Reason for visit: follow up   HPI:  Chronic pain involving both knees ,  L >R.  Taking tramadol 50 mg daily at bedtime ,  And has added CBD oil 15 mg capsule daily at bedtime.  His pain is not controlled dyring the day . disucssed adding one tramadol during day.  Hypertension: patient checks blood pressure twice weekly at home.  Readings have been for the most part < 140/80 at rest . Patient is following a reduce salt diet most days and is taking medications as prescribed.  The patient has no signs or symptoms of COVID 19 infection (fever, cough, sore throat  or shortness of breath beyond what is typical for patient).  Patient denies contact with other persons with the above mentioned symptoms or with anyone confirmed to have COVID 19 .  He h.as not been able to work out at [employer] sine the pandemic began .  Urinary stream is weak in the morning only,  Better during day  Cautioned to monitor for change  with additional use of tramadol   Chronic anticoagulation: his  INR increased from 2.0 to 2.9 from April 3 to May 18,  but has been stable at 3.0 on Jun 29    Chronic a fib:  Saw Arida July 14 coumadin clinic on July 29   MCI:  His son has been assisting him with medication administration    ROS: Patient denies headache, fevers, malaise, unintentional weight loss, skin rash, eye pain, sinus congestion and sinus pain, sore throat, dysphagia,  hemoptysis , cough, dyspnea, wheezing, chest pain, palpitations, orthopnea, edema, abdominal pain, nausea, melena, diarrhea, constipation, flank pain, dysuria, hematuria, urinary  Frequency, nocturia, numbness, tingling, seizures,  Focal weakness, Loss of consciousness,  Tremor, insomnia, depression, anxiety, and suicidal ideation.      Past Medical History:  Diagnosis Date  . Arthritis   . CAD (coronary artery disease)    a. s/p 6-V CABG in [place] in 1995; b. MV 4/12: mild intensity, fixed perfusion defect involving the basal to mid anterior wall consistent with injury to the diagonal distribution (this finding was also noted on prior study dating back to 2008). Overall, no ischemia was noted and there was no high-risk prognostic indicators based on this study. EF 63%  . Carotid artery disease ([place])    a. ultrasound 12/16: 1-39% bilateral ICA stenosis  . Chronic atrial fibrillation 2000   a. CHADS2VASc => 4 (HTN, age x 2, vascular disease); b. on Coumadin managed by [facility]  . Chronic back pain   .  Cognitive impairment   . Colon polyps   . Diastolic dysfunction    a. TTE 4/10: EF of 77%, mild diastolic dysfunction, no significant valvular abnormalities, mild ascending aortic root dilatation, moderate left atrial enlargement, technically difficult study  . Genital warts   . GERD (gastroesophageal reflux disease)   . Gout    Controlled with Allopurinol  . History of blood transfusion   . History of chicken pox   . Hyperlipidemia   .  Hypertension   . Seasonal allergies   . Sleep apnea, obstructive   . 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 CARDIAC CATHETERIZATION    . CORONARY ARTERY BYPASS GRAFT  1995   CABG x 6 in [place], [place]    . GYNECOMASTIA EXCISION    . KNEE SURGERY     bilateral   . ROTATOR CUFF REPAIR     right  . TONSILLECTOMY AND ADENOIDECTOMY  1943    Family History  Problem Relation Age of Onset  . Heart disease Father   . Heart attack Father 27       MI  . Alcohol abuse Father   . Hyperlipidemia Father   . Hypertension Father   . Heart attack Paternal Grandfather 33  . Alcohol abuse Paternal Grandfather   . Cancer Mother        Breast cancer and Lung Cancer  . Diabetes Brother   . Dementia Brother   . Crohn's disease Daughter   . Cancer Brother        lung cancer  . Alcohol abuse Son   . Alcohol abuse Maternal Grandfather   . Alcohol abuse Son   . Heart attack Paternal Grandmother 7    SOCIAL HX:  reports that he has never smoked. He has never used smokeless tobacco. He reports current alcohol use. He reports that he does not use drugs.   Current Outpatient Medications:  .  allopurinol (ZYLOPRIM) 300 MG tablet, TAKE 1 TABLET ON MONDAY, WEDNESDAY AND FRIDAY, Disp: 12 tablet, Rfl: 2 .  Artificial Tear Ointment (DRY EYES OP), Apply 1 drop 3 (three) times daily to eye., Disp: , Rfl:  .  atorvastatin (LIPITOR) 40 MG tablet, Take 1 tablet (40 mg total) by mouth daily., Disp: 90 tablet, Rfl: 1 .  furosemide (LASIX) 20 MG tablet, TAKE 1 TABLET BY MOUTH EVERY OTHER DAY FOR FLUID RETENTION, Disp: 45 tablet, Rfl: 2 .  metoprolol tartrate (LOPRESSOR) 50 MG tablet, TAKE 1 TABLET(50 MG) BY MOUTH TWICE DAILY, Disp: 180 tablet, Rfl: 1 .  traMADol (ULTRAM) 50 MG tablet, Take 1 tablet (50 mg total) by mouth 2 (two) times daily at 8 am and 10 pm., Disp: 60 tablet, Rfl: 5 .  warfarin (COUMADIN) 5 MG tablet, TAKE 1 TABLET BY MOUTH DAILY  OR AS DIRECTED BY COUMADIN CLINIC, Disp: 90 tablet, Rfl: 1  EXAM:  General impression: alert, cooperative and articulate.  No signs of being in distress  Lungs: speech is fluent sentence length suggests that patient is not short of breath and not punctuated by cough, sneezing or sniffing. Marland Kitchen   Psych: affect normal.  speech is articulate and non pressured .  Denies suicidal thoughts   ASSESSMENT AND PLAN:  Mild cognitive impairment Secondary to vascular dementia and chronic learning disability He remains independently functional . His son has been assisting with medication management. . He appears to be taking his medications correctly and has never missed  an appointment   Essential hypertension Well controlled on current regimen. Renal function stable, no changes today.  Chronic pain of left knee His pain is not controlled on one tramadol daily at night.  Adding a daytime dose.  Refill history confirmed via [place] Controlled Substance databas, accessed by me today..     I discussed the assessment and treatment plan with the patient. The patient was provided an opportunity to ask questions and all were answered. The patient agreed with the plan and demonstrated an understanding of the instructions.   The patient was advised to call back or seek an in-person evaluation if the symptoms worsen or if the condition fails to improve as anticipated.  I provided 25 minutes of non-face-to-face time during this encounter.   Crecencio Mc, MD

## 2019-03-27 NOTE — Assessment & Plan Note (Signed)
His pain is not controlled on one tramadol daily at night.  Adding a daytime dose.  Refill history confirmed via [place] Controlled Substance databas, accessed by me today.Marland Kitchen

## 2019-03-27 NOTE — Assessment & Plan Note (Signed)
Well controlled on current regimen. Renal function stable, no changes today. 

## 2019-03-27 NOTE — Assessment & Plan Note (Signed)
Secondary to vascular dementia and chronic learning disability He remains independently functional . His son has been assisting with medication management. . He appears to be taking his medications correctly and has never missed an appointment

## 2019-03-28 NOTE — Telephone Encounter (Signed)
Spoke with Angelica to let her know that we have not received any paperwork for pt. She stated that she would refax.

## 2019-04-03 DIAGNOSIS — Z0279 Encounter for issue of other medical certificate: Secondary | ICD-10-CM

## 2019-04-04 ENCOUNTER — Ambulatory Visit (INDEPENDENT_AMBULATORY_CARE_PROVIDER_SITE_OTHER): Payer: Medicare Other

## 2019-04-04 ENCOUNTER — Other Ambulatory Visit: Payer: Self-pay

## 2019-04-04 DIAGNOSIS — I4819 Other persistent atrial fibrillation: Secondary | ICD-10-CM

## 2019-04-04 DIAGNOSIS — Z5181 Encounter for therapeutic drug level monitoring: Secondary | ICD-10-CM

## 2019-04-04 LAB — POCT INR: INR: 3.5 — AB (ref 2.0–3.0)

## 2019-04-04 NOTE — Patient Instructions (Signed)
Please skip coumadin tonight, have a serving of greens today and continue dosage of 1 tablet every day.  Please be consistent w/ your greens intake.  Recheck in 5 weeks.

## 2019-04-07 ENCOUNTER — Other Ambulatory Visit: Payer: Self-pay

## 2019-04-07 ENCOUNTER — Other Ambulatory Visit (INDEPENDENT_AMBULATORY_CARE_PROVIDER_SITE_OTHER): Payer: Medicare Other

## 2019-04-07 DIAGNOSIS — I10 Essential (primary) hypertension: Secondary | ICD-10-CM | POA: Diagnosis not present

## 2019-04-07 DIAGNOSIS — Z7901 Long term (current) use of anticoagulants: Secondary | ICD-10-CM | POA: Diagnosis not present

## 2019-04-07 DIAGNOSIS — E782 Mixed hyperlipidemia: Secondary | ICD-10-CM

## 2019-04-07 DIAGNOSIS — Z5181 Encounter for therapeutic drug level monitoring: Secondary | ICD-10-CM | POA: Diagnosis not present

## 2019-04-07 LAB — LIPID PANEL
Cholesterol: 126 mg/dL (ref 0–200)
HDL: 35.7 mg/dL — ABNORMAL LOW (ref >39.00)
NonHDL: 90.51
Total CHOL/HDL Ratio: 4
Triglycerides: 206 mg/dL — ABNORMAL HIGH (ref 0.0–149.0)
VLDL: 41.2 mg/dL — ABNORMAL HIGH (ref 0.0–40.0)

## 2019-04-07 LAB — COMPREHENSIVE METABOLIC PANEL
ALT: 21 U/L (ref 0–53)
AST: 18 U/L (ref 0–37)
Albumin: 3.9 g/dL (ref 3.5–5.2)
Alkaline Phosphatase: 111 U/L (ref 39–117)
BUN: 18 mg/dL (ref 6–23)
CO2: 27 mEq/L (ref 19–32)
Calcium: 8.8 mg/dL (ref 8.4–10.5)
Chloride: 105 mEq/L (ref 96–112)
Creatinine, Ser: 1.13 mg/dL (ref 0.40–1.50)
GFR: 61.88 mL/min (ref >60.00)
Glucose, Bld: 108 mg/dL — ABNORMAL HIGH (ref 70–99)
Potassium: 3.4 mEq/L — ABNORMAL LOW (ref 3.5–5.1)
Sodium: 141 mEq/L (ref 135–145)
Total Bilirubin: 1.1 mg/dL (ref 0.2–1.2)
Total Protein: 5.8 g/dL — ABNORMAL LOW (ref 6.0–8.3)

## 2019-04-07 LAB — PROTIME-INR
INR: 1.6 ratio — ABNORMAL HIGH (ref 0.8–1.0)
Prothrombin Time: 18.1 s — ABNORMAL HIGH (ref 9.6–13.1)

## 2019-04-07 LAB — LDL CHOLESTEROL, DIRECT: Direct LDL: 65 mg/dL

## 2019-04-08 ENCOUNTER — Telehealth: Payer: Self-pay | Admitting: Internal Medicine

## 2019-04-08 ENCOUNTER — Other Ambulatory Visit: Payer: Self-pay | Admitting: Internal Medicine

## 2019-04-08 NOTE — Telephone Encounter (Signed)
Medication Refill - Medication: metoprolol tartrate (LOPRESSOR) 50 MG tablet and allopurinol (ZYLOPRIM) 300 MG tablet  Patient is out of these medications.  Preferred Pharmacy (with phone number or street name):  [id number] [address], [place] [phone] (Phone) [phone] (Fax)

## 2019-04-08 NOTE — Telephone Encounter (Signed)
Jeffery Reynolds is stating the paperwork she received needs a primary diagnose out of his problem list. Please call to advise her

## 2019-04-11 ENCOUNTER — Ambulatory Visit (INDEPENDENT_AMBULATORY_CARE_PROVIDER_SITE_OTHER): Payer: Medicare Other

## 2019-04-11 ENCOUNTER — Other Ambulatory Visit: Payer: Self-pay | Admitting: Internal Medicine

## 2019-04-11 ENCOUNTER — Encounter: Payer: Self-pay | Admitting: Internal Medicine

## 2019-04-11 ENCOUNTER — Telehealth: Payer: Self-pay | Admitting: Internal Medicine

## 2019-04-11 ENCOUNTER — Other Ambulatory Visit: Payer: Self-pay

## 2019-04-11 ENCOUNTER — Other Ambulatory Visit: Payer: Self-pay | Admitting: *Deleted

## 2019-04-11 DIAGNOSIS — Z5181 Encounter for therapeutic drug level monitoring: Secondary | ICD-10-CM

## 2019-04-11 DIAGNOSIS — T502X5A Adverse effect of carbonic-anhydrase inhibitors, benzothiadiazides and other diuretics, initial encounter: Secondary | ICD-10-CM | POA: Insufficient documentation

## 2019-04-11 DIAGNOSIS — I4819 Other persistent atrial fibrillation: Secondary | ICD-10-CM

## 2019-04-11 DIAGNOSIS — E876 Hypokalemia: Secondary | ICD-10-CM | POA: Insufficient documentation

## 2019-04-11 LAB — POCT INR: INR: 2.2 (ref 2.0–3.0)

## 2019-04-11 MED ORDER — METOPROLOL TARTRATE 50 MG PO TABS: ORAL_TABLET | ORAL | 1 refills | Status: DC

## 2019-04-11 MED ORDER — POTASSIUM CHLORIDE CRYS ER 20 MEQ PO TBCR: EXTENDED_RELEASE_TABLET | ORAL | 3 refills | Status: DC

## 2019-04-11 NOTE — Telephone Encounter (Signed)
Caller name: Percell Miller  Relation to pt: son  Call back number: [phone] [id number] [place], [place] [phone] (Phone) [phone] (Fax)     Reason for call:  Son would like to know why potassium chloride SA (K-DUR) 20 MEQ tablet was prescribed, please advise

## 2019-04-11 NOTE — Telephone Encounter (Signed)
Refill sent.

## 2019-04-11 NOTE — Telephone Encounter (Signed)
Son checking on the status of medication mentioned below, son states patient will take last BP medication today, please advise  [id number] [address], [place] [phone] (Phone) [phone] (Fax)

## 2019-04-11 NOTE — Telephone Encounter (Signed)
What primary diagnosis would you like for me to give her?

## 2019-04-11 NOTE — Telephone Encounter (Signed)
Mild cognitive impairment

## 2019-04-11 NOTE — Patient Instructions (Signed)
Please continue dosage of 1 tablet every day.  Please be consistent w/ your greens intake.  Recheck as scheduled on 9/14 @ 9:45.

## 2019-04-11 NOTE — Telephone Encounter (Signed)
potassium chloride SA (K-DUR) 20 MEQ tablet  Pt is very upset b/c does not know why this was prescribed. Please FU with pt to know why this has been given states that he has not taken before cb at [phone]

## 2019-04-12 MED ORDER — ALLOPURINOL 300 MG PO TABS: ORAL_TABLET | ORAL | 2 refills | Status: DC

## 2019-04-12 MED ORDER — ATORVASTATIN CALCIUM 40 MG PO TABS: 40.0000 mg | ORAL_TABLET | Freq: Every day | ORAL | 1 refills | Status: DC

## 2019-04-12 MED ORDER — WARFARIN SODIUM 5 MG PO TABS: ORAL_TABLET | ORAL | 1 refills | Status: DC

## 2019-04-12 MED ORDER — FUROSEMIDE 20 MG PO TABS: ORAL_TABLET | ORAL | 2 refills | Status: DC

## 2019-04-12 NOTE — Telephone Encounter (Signed)
Angelica returned call information as prescribed by PCP.

## 2019-04-12 NOTE — Telephone Encounter (Signed)
Pt son Jeffery Reynolds returned call to the office. Attempted to transfer call to the office but there was no answer. [name] requests call back.

## 2019-04-12 NOTE — Telephone Encounter (Signed)
LMTCB

## 2019-04-12 NOTE — Telephone Encounter (Signed)
Spoke with both pt and pt's son about why the pt needs to take the potassium. Both gave a verbal understanding. Pt's son also asked that we [facility] [address] Because the mail order pharmacy is taking a while to get to him.

## 2019-04-12 NOTE — Addendum Note (Signed)
Addended by: Adair Laundry on: 04/12/2019 03:01 PM   Modules accepted: Orders

## 2019-04-12 NOTE — Telephone Encounter (Signed)
Last OV 03/25/19 ok refill allopurinol ? Refill sent.

## 2019-05-09 ENCOUNTER — Ambulatory Visit (INDEPENDENT_AMBULATORY_CARE_PROVIDER_SITE_OTHER): Payer: Medicare Other

## 2019-05-09 ENCOUNTER — Other Ambulatory Visit: Payer: Self-pay

## 2019-05-09 DIAGNOSIS — Z5181 Encounter for therapeutic drug level monitoring: Secondary | ICD-10-CM

## 2019-05-09 DIAGNOSIS — I4819 Other persistent atrial fibrillation: Secondary | ICD-10-CM | POA: Diagnosis not present

## 2019-05-09 LAB — POCT INR: INR: 4.9 — AB (ref 2.0–3.0)

## 2019-05-09 NOTE — Patient Instructions (Signed)
Please have a large serving of greens today, skip your warfarin tonight, then resume dosage of 1 tablet every day.  Please be consistent w/ your greens intake - pick a day each week to have your greens and have them every week on that same day.  Recheck in 4 weeks.

## 2019-06-04 ENCOUNTER — Other Ambulatory Visit: Payer: Self-pay | Admitting: Internal Medicine

## 2019-06-06 ENCOUNTER — Ambulatory Visit (INDEPENDENT_AMBULATORY_CARE_PROVIDER_SITE_OTHER): Payer: Medicare Other

## 2019-06-06 ENCOUNTER — Other Ambulatory Visit: Payer: Self-pay

## 2019-06-06 DIAGNOSIS — I4819 Other persistent atrial fibrillation: Secondary | ICD-10-CM

## 2019-06-06 DIAGNOSIS — Z5181 Encounter for therapeutic drug level monitoring: Secondary | ICD-10-CM | POA: Diagnosis not present

## 2019-06-06 LAB — POCT INR: INR: 3.7 — AB (ref 2.0–3.0)

## 2019-06-06 NOTE — Patient Instructions (Signed)
Please skip warfarin tonight, then START NEW DOSAGE of 1 tablet every day EXCEPT 1/2 tablet on [place].  Please be consistent w/ your greens intake - pick a day each week to have your greens and have them every week on that same day.  Recheck in 2 weeks.

## 2019-06-07 ENCOUNTER — Other Ambulatory Visit: Payer: Self-pay

## 2019-06-09 ENCOUNTER — Other Ambulatory Visit: Payer: Self-pay | Admitting: *Deleted

## 2019-06-09 NOTE — Patient Outreach (Signed)
[place] [facility]) Care Management  06/09/2019  [name] [birth year] [id number]   Open in error  .Kimberly L. Lavina Hamman, RN, BSN, [place] Coordinator Office number [phone] Mobile number [phone]  Main [facility] number [phone] Fax number [phone]

## 2019-06-10 ENCOUNTER — Encounter: Payer: Self-pay | Admitting: *Deleted

## 2019-06-10 ENCOUNTER — Other Ambulatory Visit: Payer: Self-pay | Admitting: *Deleted

## 2019-06-10 NOTE — Patient Outreach (Signed)
[place] [facility]) Care Management  06/10/2019  Jeffery Reynolds 01-03-1934 [id number]   Care coordination  Collaborated with [facility] SW, A [place] about a program for reimbursement for Ed, son to assist [name] at home [facility] SW confirms [name] will need medicaid for this first  [facility] RN CM returned a call to [name]  HIPAA verified  Updated him that first he must re apply to the [place] and get the medicaid approved before Ed can be reimbursed for caring for him He voiced understanding and appreciation  He reports he will go to re apply as he last applied at the beginning of 2020 "about three to four months ago I believe"  Deyana Wnuk L. Lavina Hamman, RN, BSN, [place] Coordinator Office number [phone] Mobile number [phone]  Main [facility] number [phone] Fax number [phone]

## 2019-06-10 NOTE — Patient Outreach (Signed)
[facility]) Care Management  06/10/2019  SAHIL SUM 11-11-1933 [id number]   EMMI-Join   Referral Date 06/07/19 Referral Reason:EMMI Join score = 9   Insurance: [employer] Medicare, mutual of [place]  [facility] admissions x  0 ED visits x 0 in the last 6 months    Outreach attempt #1 successful contact at his listed home/mobile number Patient is able to verify HIPAA, DOB and address [facility] Care Management RN reviewed EMMI Join referral with patient  [facility] telephone assessment Mr Fitt was further assessed after the engagement assessment and he denies need of [facility] RN CM for coordination of complex medical needs  He reports he and his son, Ed are managing at home  After review of the [facility] services available He agreed to being followed by [facility] health coach for further education on his medical conditions (atrial fibrillation, HTN), especially when [facility] RN began to answer questions about his potassium and lasix use. [facility] RN CM provided education on the balance of sodium, fluids and potassium. CM answered questions about his increase urination when he takes the lasix. He mentioned initially he was not on a particular diet but confirms later in conversation that Dr Derrel Nip has discussed him decreasing his sodium intake.   Referred him to his cardiologist and primary for questions about future lab check question  Social: Mr Uehara is a 85 year old recently divorced male who lives at home with his son, Ed. 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 He grew up in [place], [place] and has lived in [place]. He moved to [place] area in March 2015 to be close to his sons. He enjoys reading and has a gym to exercise. He tries to remain active 5 days a week He is able to complete his ADLs except needs some assist at intervals from Ed to put his shoes on. ED completes the iADLs like preparing food, cleaning  the home, bills, errands and provides transportation to medical appointments. They have applied for CAPs services to try to get assistance with getting Ed paid for his services but this is pending.  He reports he has filed for [employer] but missed approval by $300 and is completing the spin down process     Conditions: CAD, HTN, Chronic Atrial fibrillation, venous stasis of both lower extremities, OSA, tubular adenoma of colon, arthritis, dysplastic nevus of neck, osteoarthritis of right thumb, HLD, hx of gout, seasonal allergies, constipation, worsening vision, urinary hesitancy , insomnia dur to anxiety and fear obesity   Falls He reports 2 falls but reports the last one was about "six months ago" He states he fell on concrete floor and hit his head, visited hospital and had sutures but does not have residual concerns  He reports another fall when a cord of a pet got wrapped around his foot in his bedroom, he fell and hit his head on his bed but not injured  "I have a hard head"  He discussed these falls were mechanical He denies falls related to hypotension, dizziness or medical issues. He reports them as true accidents  [facility] RN CM unable to find entry in Epic or care everywhere indicating hospital visits for either fall  Fall precautions, prevention discussed   DME: CPAP glasses  Medications: He denies concerns with taking medications as prescribed, affording medications, side effects of medications and questions about medications  Appointments: 06/20/19 cardiology 09/28/2019 primary care MD visit    Advance Directives:  Demetries Arnau is the POA Denies need for assist with changes to present advance directives     Consent: [facility] RN CM reviewed [facility] services with patient. Patient gave verbal consent for services [facility] telephonic RN CM, [facility] SW and [facility] health coach.   Advised patient that there will be further automated EMMI- post discharge calls to assess how the patient is doing following  the recent hospitalization Advised the patient that another call may be received from a nurse if any of their responses were abnormal. Patient voiced understanding and was appreciative of f/u call.   Plan: [facility] RN CM will refer Mr Ysaguirre to [facility] health coach for further education and support in managing Atrial fibrillation and HTN   [facility] RN CM will collaborate with [facility] SW about a possible program to assist son ED with funding for care of Mr Korinek   Pt encouraged to return a call to [facility] RN CM prn and provided [facility] RN CM number   RN CM provided patient with [facility] 24 hr Nurse Line contact [phone].  [facility] RN CM sent a successful outreach letter as discussed with [facility] brochure enclosed for review  Routed note to MDs/NP/PA   [place]. Lavina Hamman, RN, BSN, [place] Coordinator Office number [phone] Mobile number [phone]  Main [facility] number [phone] Fax number [phone]

## 2019-06-13 ENCOUNTER — Other Ambulatory Visit: Payer: Self-pay | Admitting: *Deleted

## 2019-06-20 ENCOUNTER — Ambulatory Visit (INDEPENDENT_AMBULATORY_CARE_PROVIDER_SITE_OTHER): Payer: Medicare Other

## 2019-06-20 ENCOUNTER — Other Ambulatory Visit: Payer: Self-pay

## 2019-06-20 DIAGNOSIS — I4819 Other persistent atrial fibrillation: Secondary | ICD-10-CM | POA: Diagnosis not present

## 2019-06-20 DIAGNOSIS — Z5181 Encounter for therapeutic drug level monitoring: Secondary | ICD-10-CM | POA: Diagnosis not present

## 2019-06-20 LAB — POCT INR: INR: 2.2 (ref 2.0–3.0)

## 2019-06-20 NOTE — Patient Instructions (Signed)
Please continue dosage of 1 tablet every day EXCEPT 1/2 tablet on [place].  Please be consistent w/ your greens intake - pick a day each week to have your greens and have them every week on that same day.  Recheck in 3 weeks.

## 2019-06-20 NOTE — Progress Notes (Signed)
Pt seems to be more forgetful at each visit.  He was late to today's visit, stating that he went to the wrong entrance and couldn't get in.  After his visit w/ me, I pushed him in the wheelchair to the [facility], which is the only entrance to [facility] at this time.  On arrival to the [place] parking stand, pt handed them a yellow parking ticket.  They give out blue tickets and routed me to [facility].  Pushed pt over to the [facility] entrance and a lady at the entrance stated that pt was advised when he arrived there that he was at the wrong entrance, but he insisted that someone park his car.  They obliged, informed him that they charge $3 for their valet service and called the courtesy car to take pt around to the [place] entrance.  Pt was advised to call them to bring him back, but he had no recollection of this.  Pt did not have $3 to pay valet.  Pt then asked if he had seen me yet.  Of note, pt used to go to the gym daily and would walk to all of his appts, but has been using the hospital's wheelchair since [place] has kept his gym closed.  Pt's son typically drives him to his appt, but his son was "running behind", so pt drove himself today.

## 2019-07-11 ENCOUNTER — Ambulatory Visit (INDEPENDENT_AMBULATORY_CARE_PROVIDER_SITE_OTHER): Payer: Medicare Other

## 2019-07-11 ENCOUNTER — Other Ambulatory Visit: Payer: Self-pay

## 2019-07-11 DIAGNOSIS — Z5181 Encounter for therapeutic drug level monitoring: Secondary | ICD-10-CM

## 2019-07-11 DIAGNOSIS — I4819 Other persistent atrial fibrillation: Secondary | ICD-10-CM | POA: Diagnosis not present

## 2019-07-11 LAB — POCT INR: INR: 2.5 (ref 2.0–3.0)

## 2019-07-11 NOTE — Patient Instructions (Signed)
Please continue dosage of 1 tablet every day EXCEPT 1/2 tablet on [place].  Please be consistent w/ your greens intake - pick a day each week to have your greens and have them every week on that same day.  Recheck in 4 weeks.

## 2019-07-13 ENCOUNTER — Ambulatory Visit: Payer: Self-pay | Admitting: *Deleted

## 2019-08-08 ENCOUNTER — Other Ambulatory Visit: Payer: Self-pay

## 2019-08-08 ENCOUNTER — Ambulatory Visit (INDEPENDENT_AMBULATORY_CARE_PROVIDER_SITE_OTHER): Payer: Medicare Other

## 2019-08-08 DIAGNOSIS — I4819 Other persistent atrial fibrillation: Secondary | ICD-10-CM | POA: Diagnosis not present

## 2019-08-08 DIAGNOSIS — Z5181 Encounter for therapeutic drug level monitoring: Secondary | ICD-10-CM

## 2019-08-08 LAB — POCT INR: INR: 2.6 (ref 2.0–3.0)

## 2019-08-08 NOTE — Progress Notes (Signed)
Pt arrived to his 1:30 appt at 10:30 today, stating that he did not know what time his appt was.  Pt in wheelchair again today.  Asked him if his son drove him over, he stated that he could not find his son anywhere in their home today, so he left on his own and drive himself here. Pt states that he had the valets park his car today.  [place] volunteers were asked to bring pt back to [facility], as he is in one of their wheelchairs.

## 2019-08-08 NOTE — Patient Instructions (Signed)
Please continue dosage of 1 tablet every day EXCEPT 1/2 tablet on [place].  Please be consistent w/ your greens intake - pick a day each week to have your greens and have them every week on that same day.  Recheck in 5 weeks.

## 2019-08-22 ENCOUNTER — Other Ambulatory Visit: Payer: Self-pay | Admitting: *Deleted

## 2019-08-22 NOTE — Patient Outreach (Signed)
[name]) Care Management  08/22/2019  Jeffery Reynolds [birth year] [id number]  RN Health Coach telephone call to patient.  Hipaa compliance verified. Patient stated that he needs the nurse to call back. He is busy at this time.  Plan: RN will call back within 30 days  [place] Management [phone]

## 2019-08-23 ENCOUNTER — Telehealth: Payer: Self-pay | Admitting: Internal Medicine

## 2019-08-23 NOTE — Telephone Encounter (Signed)
Pt son called about wanting to know when pt should have labs done again?  Also pt needs a new cpap machine pt has not been using it for awhile. Please advise and Thank you!  [name] wills @ [phone].

## 2019-08-24 ENCOUNTER — Telehealth: Payer: Self-pay | Admitting: Cardiovascular Disease

## 2019-08-24 DIAGNOSIS — I2510 Atherosclerotic heart disease of native coronary artery without angina pectoris: Secondary | ICD-10-CM

## 2019-08-24 DIAGNOSIS — I482 Chronic atrial fibrillation, unspecified: Secondary | ICD-10-CM

## 2019-08-24 DIAGNOSIS — I10 Essential (primary) hypertension: Secondary | ICD-10-CM

## 2019-08-24 NOTE — Telephone Encounter (Signed)
He will likely need a CMET and magnesium. Also due for updated CBC. He can have them done prior to the visit or at the visit, whichever he prefers.   Looks like he called his PCP about this as well. Don't think they would need any additional labs but if he happens to have already had them collected with his PCP since yesterday, no need to repeat.   Loel Dubonnet, NP

## 2019-08-24 NOTE — Telephone Encounter (Signed)
Patient is being seen by Overton Mam., NP on 09/07/19. Message fwd to [facility] to advise if the pt will need lab work prior to his appt.

## 2019-08-24 NOTE — Telephone Encounter (Signed)
Spoke with the patient and made her aware of Caitlin's response and recommendation. The patient would like to have the labwork prior to his upcoming appt so that the results will be available for his upcoming appt.  Lab orders in Epic for (Cmet, Cbc, Mag). Advised the patient that the labs are to be drawn at the [facility] medical mall. Patient verbalized understanding and voiced appreciation for the assistance.

## 2019-08-24 NOTE — Telephone Encounter (Signed)
Scheduled for ov from recall patient wants to know if he needs k+ checked

## 2019-08-30 ENCOUNTER — Telehealth: Payer: Self-pay | Admitting: Internal Medicine

## 2019-08-30 DIAGNOSIS — R7301 Impaired fasting glucose: Secondary | ICD-10-CM

## 2019-08-30 DIAGNOSIS — E876 Hypokalemia: Secondary | ICD-10-CM

## 2019-08-30 NOTE — Telephone Encounter (Signed)
Pt son Percell Miller called to let us know that pt is no longer taking his potassium pill They also have question a question about labs I didn't see Percell Miller on the [facility]

## 2019-09-01 NOTE — Telephone Encounter (Signed)
Spoke with pt and scheduled him for a lab appt on Monday. Pt's son wrote down the appt date and time.

## 2019-09-01 NOTE — Telephone Encounter (Signed)
See other telephone encounter.

## 2019-09-01 NOTE — Telephone Encounter (Signed)
He needs to have his potassium level checked [place].  LABS ORDERED

## 2019-09-01 NOTE — Telephone Encounter (Signed)
Spoke with pt and he stated that he has not been taking the potassium pill for the last two days because he thinks that is what is causing him to have to urinate 2 minutes after taking it. The pts son stated that the pt is taking the furosemide and the potassium at the same time every other day. I tried explaining to the pt that it is the furosemide that is causing him to have to urinate more frequently. The pt stated that he is not going to take the medication because of that so he is wanting to know if there is something else that he can take instead. Scheduled pt for a telephone visit on Tuesday.

## 2019-09-05 ENCOUNTER — Other Ambulatory Visit (INDEPENDENT_AMBULATORY_CARE_PROVIDER_SITE_OTHER): Payer: Medicare Other

## 2019-09-05 ENCOUNTER — Other Ambulatory Visit: Payer: Self-pay

## 2019-09-05 DIAGNOSIS — E876 Hypokalemia: Secondary | ICD-10-CM

## 2019-09-05 DIAGNOSIS — R7301 Impaired fasting glucose: Secondary | ICD-10-CM

## 2019-09-05 LAB — COMPREHENSIVE METABOLIC PANEL
ALT: 31 U/L (ref 0–53)
AST: 24 U/L (ref 0–37)
Albumin: 4.1 g/dL (ref 3.5–5.2)
Alkaline Phosphatase: 120 U/L — ABNORMAL HIGH (ref 39–117)
BUN: 21 mg/dL (ref 6–23)
CO2: 30 mEq/L (ref 19–32)
Calcium: 9.3 mg/dL (ref 8.4–10.5)
Chloride: 104 mEq/L (ref 96–112)
Creatinine, Ser: 1.28 mg/dL (ref 0.40–1.50)
GFR: 53.54 mL/min — ABNORMAL LOW (ref >60.00)
Glucose, Bld: 129 mg/dL — ABNORMAL HIGH (ref 70–99)
Potassium: 4 mEq/L (ref 3.5–5.1)
Sodium: 140 mEq/L (ref 135–145)
Total Bilirubin: 1.1 mg/dL (ref 0.2–1.2)
Total Protein: 6.8 g/dL (ref 6.0–8.3)

## 2019-09-05 LAB — HEMOGLOBIN A1C: Hgb A1c MFr Bld: 5.4 % (ref 4.6–6.5)

## 2019-09-06 ENCOUNTER — Ambulatory Visit (INDEPENDENT_AMBULATORY_CARE_PROVIDER_SITE_OTHER): Payer: Medicare Other | Admitting: Internal Medicine

## 2019-09-06 ENCOUNTER — Encounter: Payer: Self-pay | Admitting: Internal Medicine

## 2019-09-06 VITALS — Ht 68.0 in | Wt 215.0 lb

## 2019-09-06 DIAGNOSIS — R443 Hallucinations, unspecified: Secondary | ICD-10-CM | POA: Diagnosis not present

## 2019-09-06 DIAGNOSIS — E876 Hypokalemia: Secondary | ICD-10-CM

## 2019-09-06 DIAGNOSIS — G8929 Other chronic pain: Secondary | ICD-10-CM | POA: Diagnosis not present

## 2019-09-06 DIAGNOSIS — M25562 Pain in left knee: Secondary | ICD-10-CM | POA: Diagnosis not present

## 2019-09-06 DIAGNOSIS — G4733 Obstructive sleep apnea (adult) (pediatric): Secondary | ICD-10-CM | POA: Diagnosis not present

## 2019-09-06 DIAGNOSIS — T502X5A Adverse effect of carbonic-anhydrase inhibitors, benzothiadiazides and other diuretics, initial encounter: Secondary | ICD-10-CM | POA: Diagnosis not present

## 2019-09-06 NOTE — Progress Notes (Addendum)
Telephone Note  This visit type was conducted due to national recommendations for restrictions regarding the COVID-19 pandemic (e.g. social distancing).  This format is felt to be most appropriate for this patient at this time.  All issues noted in this document were discussed and addressed.  No physical exam was performed (except for noted visual exam findings with Video Visits).   I connected with@ on 09/06/19 at  3:30 PM EST by  telephone and verified that I am speaking with the correct person using two identifiers. Location patient: home Location provider: work or home office Persons participating in the virtual visit: patient, provider and patient's son [name]   I discussed the limitations, risks, security and privacy concerns of performing an evaluation and management service by telephone and the availability of in person appointments. I also discussed with the patient that there may be a patient responsible charge related to this service. The patient expressed understanding and agreed to proceed.  Reason for visit:  Follow up    HPI:  86 yr old with Hypertension hyperlipidemia , Atrial fib on coumadin  and MCI   Urinary incontinence resolved since potassium supplement was stopped 5 days ago.  takign 20 mg lasix every other day .   Early am urinary hesitancy,   4 hrs later much better flow. May be tramadol effect   Hallucinations /confusion  Started when tramadol dose was increased.  Also has untreated OSA  And MCI  Currently taking 50 mg tramadol in the am  25 mg at  bedtime.  Not taking any  tylenol   Last sleep study 8-9 yrs ago   ROS: See pertinent positives and negatives per HPI.  Past Medical History:  Diagnosis Date  . Arthritis   . CAD (coronary artery disease)    a. s/p 6-V CABG in [place] in 1995; b. MV 4/12: mild intensity, fixed perfusion defect involving the basal to mid anterior wall consistent with injury to the diagonal distribution (this finding was also noted on  prior study dating back to 2008). Overall, no ischemia was noted and there was no high-risk prognostic indicators based on this study. EF 63%  . Carotid artery disease ([place])    a. ultrasound 12/16: 1-39% bilateral ICA stenosis  . Chronic atrial fibrillation ([place]) 2000   a. CHADS2VASc => 4 (HTN, age x 2, vascular disease); b. on [facility]  . Chronic back pain   . Cognitive impairment   . Colon polyps   . Diastolic dysfunction    a. TTE 4/10: EF of [id number], mild diastolic dysfunction, no significant valvular abnormalities, mild ascending aortic root dilatation, moderate left atrial enlargement, technically difficult study  . Genital warts   . GERD (gastroesophageal reflux disease)   . Gout    Controlled with Allopurinol  . History of blood transfusion   . History of chicken pox   . Hyperlipidemia   . Hypertension   . Seasonal allergies   . Sleep apnea, obstructive   . 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 CARDIAC CATHETERIZATION    . CORONARY ARTERY BYPASS GRAFT  1995   CABG x 6 in [place], [place]    . GYNECOMASTIA EXCISION    . KNEE SURGERY     bilateral   . ROTATOR CUFF REPAIR     right  . TONSILLECTOMY AND ADENOIDECTOMY  1943    Family History  Problem Relation Age of Onset  .  Heart disease Father   . Heart attack Father 38       MI  . Alcohol abuse Father   . Hyperlipidemia Father   . Hypertension Father   . Heart attack Paternal Grandfather 62  . Alcohol abuse Paternal Grandfather   . Cancer Mother        Breast cancer and Lung Cancer  . Diabetes Brother   . Dementia Brother   . Crohn's disease Daughter   . Cancer Brother        lung cancer  . Alcohol abuse Son   . Alcohol abuse Maternal Grandfather   . Alcohol abuse Son   . Heart attack Paternal Grandmother 70    SOCIAL HX:  reports that he has never smoked. He has never used smokeless tobacco. He reports current alcohol  use. He reports that he does not use drugs.   Current Outpatient Medications:  .  allopurinol (ZYLOPRIM) 300 MG tablet, TAKE 1 TABLET ON MONDAY, WEDNESDAY AND FRIDAY, Disp: 12 tablet, Rfl: 2 .  atorvastatin (LIPITOR) 40 MG tablet, Take 1 tablet (40 mg total) by mouth daily., Disp: 90 tablet, Rfl: 1 .  furosemide (LASIX) 20 MG tablet, TAKE 1 TABLET BY MOUTH EVERY OTHER DAY FOR FLUID RETENTION, Disp: 45 tablet, Rfl: 2 .  metoprolol tartrate (LOPRESSOR) 50 MG tablet, TAKE 1 TABLET(50 MG) BY MOUTH TWICE DAILY, Disp: 180 tablet, Rfl: 1 .  potassium chloride SA (K-DUR) 20 MEQ tablet, One tablet by mouth every other day with furosemide, Disp: 30 tablet, Rfl: 3 .  traMADol (ULTRAM) 50 MG tablet, Take 1 tablet (50 mg total) by mouth 2 (two) times daily at 8 am and 10 pm., Disp: 60 tablet, Rfl: 5 .  warfarin (COUMADIN) 5 MG tablet, TAKE 1 TABLET BY MOUTH DAILY OR AS DIRECTED BY COUMADIN CLINIC (Patient taking differently: Take 5 mg by mouth. TAKE 1 TABLET BY MOUTH all days except for Monday and Friday. Monday and Friday pt takes 2.5 mg.), Disp: 90 tablet, Rfl: 1 .  Artificial Tear Ointment (DRY EYES OP), Apply 1 drop 3 (three) times daily to eye., Disp: , Rfl:   EXAM:   General impression: alert, cooperative and articulate.  No signs of being in distress  Lungs: speech is fluent sentence length suggests that patient is not short of breath and not punctuated by cough, sneezing or sniffing. Marland Kitchen   Psych: affect normal.  speech is articulate and non pressured .  Denies suicidal thoughts   ASSESSMENT AND PLAN:  Discussed the following assessment and plan:  Obstructive sleep apnea - Plan: Home sleep test  Chronic pain of left knee  Diuretic-induced hypokalemia - Plan: Home sleep test  Hallucinations  Chronic pain of left knee Advised to reduce  morningdose of tramadol to 25 mg,  Add 1000 mg tylenol at lunch, 50 mg tramadol at dinner and 1000 mg at bedtime.   Diuretic-induced hypokalemia Potassium  has normalized and he is not tolerating supplements due to excessive urination   Hallucinations Etiology unclear.  May be tramadol side effect vs delirium from untreated OSA/hypoxia  Vs progression of MCI to FTD.  Will repeat sleep study , reduce daytime tramadol dose; if no change,  Will need neurology evaluation   Obstructive sleep apnea Currently not using CPAP due to mask mallfunction.  Last sleep study was 8 years ago. Home sleep study ordered.     I discussed the assessment and treatment plan with the patient. The patient was provided an opportunity to ask  questions and all were answered. The patient agreed with the plan and demonstrated an understanding of the instructions.   The patient was advised to call back or seek an in-person evaluation if the symptoms worsen or if the condition fails to improve as anticipated.   I provided  30 minutes of non-face-to-face time during this telephone encounter reviewing patient's current problems and past procedures/imaging studies, providing counseling on the above mentioned problems , and coordination  of care .  Crecencio Mc, MD

## 2019-09-07 ENCOUNTER — Ambulatory Visit: Payer: Medicare Other | Admitting: Family

## 2019-09-07 DIAGNOSIS — R443 Hallucinations, unspecified: Secondary | ICD-10-CM | POA: Insufficient documentation

## 2019-09-07 NOTE — Assessment & Plan Note (Signed)
Advised to reduce  morningdose of tramadol to 25 mg,  Add 1000 mg tylenol at lunch, 50 mg tramadol at dinner and 1000 mg at bedtime.

## 2019-09-07 NOTE — Assessment & Plan Note (Signed)
Currently not using CPAP due to mask mallfunction.  Last sleep study was 8 years ago. Home sleep study ordered.

## 2019-09-07 NOTE — Assessment & Plan Note (Signed)
Etiology unclear.  May be tramadol side effect vs delirium from untreated OSA/hypoxia  Vs progression of MCI to FTD.  Will repeat sleep study , reduce daytime tramadol dose; if no change,  Will need neurology evaluation

## 2019-09-07 NOTE — Assessment & Plan Note (Signed)
Potassium has normalized and he is not tolerating supplements due to excessive urination

## 2019-09-08 ENCOUNTER — Encounter: Payer: Self-pay | Admitting: Family

## 2019-09-12 ENCOUNTER — Telehealth: Payer: Self-pay | Admitting: Cardiovascular Disease

## 2019-09-12 DIAGNOSIS — R042 Hemoptysis: Secondary | ICD-10-CM

## 2019-09-12 DIAGNOSIS — Z7901 Long term (current) use of anticoagulants: Secondary | ICD-10-CM

## 2019-09-12 NOTE — Telephone Encounter (Signed)
Patient son states he has been spitting up blood. States this is not anything new that has started, but has gotten worse. Please call to discuss. Pt is on Warfarin.

## 2019-09-12 NOTE — Telephone Encounter (Signed)
Spoke with the patients son Percell Miller. Percell Miller sts that the patient has reported to his children that he occasionally coughs up small amounts of blood. Percell Miller sts that yesterday was the first time he was able to witness. In description Terrilee Croak that it was a small amount. He is not able to give a detailed description (pink tinged -coffee ground). Percell Miller denies any other signs of bleeding. No blood in the patients stool.  The patient does report increase in frequency of Hemoptysis. Percell Miller sts that the patient is cognitively declining. He was scheduled for his CVRR appt at our office today but did not make it here. Percell Miller sts that the patient "got turned around." He has been to our office dozens of time. His Coumadin appt has been rescheduled for 09/14/19.  His last INR check was therapeutic 2.6. He is taking Warfarin as prescribed. His last Cbc was in Aug 2019 and was normal. Edwards sts that Dr. Derrel Nip is working the patient up for OSA. Patient denies fever, chills, weight gain, swelling, sob, fatigue, PND, orthopnea.  Idelle Jo that I will fwd the message to Dr. Fletcher Anon. Recommended that he contact the patients pcp Dr. Derrel Nip to update her as well. Idelle Jo in the interim he should monitor the patient for increased signs of bleeding and that the patient should be taken to the ED for worsening symptoms.  Idelle Jo that I will call back with Dr. Tyrell Antonio response and recommendation.

## 2019-09-12 NOTE — Telephone Encounter (Signed)
Patient returning call.

## 2019-09-12 NOTE — Addendum Note (Signed)
Addended by: Crecencio Mc on: 09/12/2019 02:57 PM   Modules accepted: Level of Service

## 2019-09-12 NOTE — Telephone Encounter (Signed)
Returned the call to the patients son Veatrice Kells. Also called the patient directly lmtcb.

## 2019-09-12 NOTE — Telephone Encounter (Signed)
Check CBC when he comes for his INR check.  Referred to pulmonary for evaluation of hemoptysis.

## 2019-09-13 ENCOUNTER — Telehealth: Payer: Self-pay | Admitting: Internal Medicine

## 2019-09-13 DIAGNOSIS — F0390 Unspecified dementia without behavioral disturbance: Secondary | ICD-10-CM

## 2019-09-13 NOTE — Telephone Encounter (Signed)
Spoke with pt's son and informed him that pt should not be alone, he does have early dementia and she has placed a referral to neurology. Pt is also scheduled for a virtual visit with Dr. Derrel Nip on Thursday at 12pm. Son is aware.

## 2019-09-13 NOTE — Telephone Encounter (Signed)
Does this need to be an in office visit or is a virtual visit okay?

## 2019-09-13 NOTE — Addendum Note (Signed)
Addended by: Crecencio Mc on: 09/13/2019 03:35 PM   Modules accepted: Orders

## 2019-09-13 NOTE — Telephone Encounter (Signed)
Virtual visit is ok.   Patient should not stay alone if he is having this much trouble.  Patient does have early dementia ,  Referral to neurology will be placed.

## 2019-09-13 NOTE — Telephone Encounter (Signed)
Orders placed for the patient to have his cbc drawn on 09/14/19 when he comes into the office for his INR check. Ref placed to Pulmonology dx hemoptysis. Called the pt to give an update, lmtcb. Also called the patients son Percell Miller, lmtcb.
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
## 2019-09-14 ENCOUNTER — Other Ambulatory Visit: Payer: Self-pay

## 2019-09-14 ENCOUNTER — Ambulatory Visit (INDEPENDENT_AMBULATORY_CARE_PROVIDER_SITE_OTHER): Payer: Medicare Other

## 2019-09-14 ENCOUNTER — Other Ambulatory Visit: Payer: Medicare Other

## 2019-09-14 DIAGNOSIS — Z7901 Long term (current) use of anticoagulants: Secondary | ICD-10-CM

## 2019-09-14 DIAGNOSIS — I4819 Other persistent atrial fibrillation: Secondary | ICD-10-CM | POA: Diagnosis not present

## 2019-09-14 DIAGNOSIS — Z5181 Encounter for therapeutic drug level monitoring: Secondary | ICD-10-CM | POA: Diagnosis not present

## 2019-09-14 LAB — POCT INR: INR: 2.5 (ref 2.0–3.0)

## 2019-09-14 NOTE — Patient Instructions (Signed)
Please continue dosage of 1 tablet every day EXCEPT 1/2 tablet on [place].  Please be consistent w/ your greens intake - pick a day each week to have your greens and have them every week on that same day.  Recheck in 6 weeks.

## 2019-09-15 ENCOUNTER — Ambulatory Visit: Payer: Medicare Other | Admitting: Pulmonary Disease

## 2019-09-15 ENCOUNTER — Encounter: Payer: Self-pay | Admitting: Internal Medicine

## 2019-09-15 ENCOUNTER — Encounter: Payer: Self-pay | Admitting: Pulmonary Disease

## 2019-09-15 ENCOUNTER — Ambulatory Visit (INDEPENDENT_AMBULATORY_CARE_PROVIDER_SITE_OTHER): Payer: Medicare Other | Admitting: Internal Medicine

## 2019-09-15 VITALS — BP 120/68 | HR 75 | Temp 97.0°F | Ht 67.5 in | Wt 206.0 lb

## 2019-09-15 DIAGNOSIS — G4733 Obstructive sleep apnea (adult) (pediatric): Secondary | ICD-10-CM

## 2019-09-15 DIAGNOSIS — Z9989 Dependence on other enabling machines and devices: Secondary | ICD-10-CM

## 2019-09-15 DIAGNOSIS — Z96652 Presence of left artificial knee joint: Secondary | ICD-10-CM | POA: Diagnosis not present

## 2019-09-15 DIAGNOSIS — R040 Epistaxis: Secondary | ICD-10-CM

## 2019-09-15 DIAGNOSIS — F0390 Unspecified dementia without behavioral disturbance: Secondary | ICD-10-CM

## 2019-09-15 DIAGNOSIS — R042 Hemoptysis: Secondary | ICD-10-CM | POA: Diagnosis not present

## 2019-09-15 DIAGNOSIS — D696 Thrombocytopenia, unspecified: Secondary | ICD-10-CM

## 2019-09-15 LAB — CBC WITH DIFFERENTIAL/PLATELET
Basophils Absolute: 0 10*3/uL (ref 0.0–0.2)
Basos: 1 %
EOS (ABSOLUTE): 0.1 10*3/uL (ref 0.0–0.4)
Eos: 2 %
Hematocrit: 43.5 % (ref 37.5–51.0)
Hemoglobin: 14.9 g/dL (ref 13.0–17.7)
Immature Grans (Abs): 0 10*3/uL (ref 0.0–0.1)
Immature Granulocytes: 0 %
Lymphocytes Absolute: 1.3 10*3/uL (ref 0.7–3.1)
Lymphs: 26 %
MCH: 33.5 pg — ABNORMAL HIGH (ref 26.6–33.0)
MCHC: 34.3 g/dL (ref 31.5–35.7)
MCV: 98 fL — ABNORMAL HIGH (ref 79–97)
Monocytes Absolute: 0.8 10*3/uL (ref 0.1–0.9)
Monocytes: 15 %
Neutrophils Absolute: 2.8 10*3/uL (ref 1.4–7.0)
Neutrophils: 56 %
Platelets: 110 10*3/uL — ABNORMAL LOW (ref 150–450)
RBC: 4.45 x10E6/uL (ref 4.14–5.80)
RDW: 13.6 % (ref 11.6–15.4)
WBC: 5 10*3/uL (ref 3.4–10.8)

## 2019-09-15 NOTE — Patient Instructions (Addendum)
We have scheduled CT of the sinuses on the chest to evaluate your problem with coughing up blood I suspect this may be coming from your sinuses.  Dr. Derrel Nip will arrange for your new machine and mask for your obstructive sleep apnea after your sleep study which she has ordered already.  Your platelet count is chronically low and this and use of the blood thinner will aggravate your bleeding issues.   We will see him in follow-up in 4 to 6 weeks time call sooner should any new difficulties arise.

## 2019-09-15 NOTE — Progress Notes (Signed)
    Assessment & Plan:  1. Hemoptysis (Primary) - CT CHEST WO CONTRAST; Future - CT MAXILLOFACIAL WO CONTRAST; Future  2. Epistaxis - CT CHEST WO CONTRAST; Future - CT MAXILLOFACIAL WO CONTRAST; Future  3. Thrombocytopenia - CT CHEST WO CONTRAST; Future - CT MAXILLOFACIAL WO CONTRAST; Future  4. OSA on CPAP  5. Dementia without behavioral disturbance, unspecified dementia type [facility])   Patient Instructions  We have scheduled CT of the sinuses on the chest to evaluate your problem with coughing up blood I suspect this may be coming from your sinuses.  Dr. Tullo will arrange for your new machine and mask for your obstructive sleep apnea after your sleep study which she has ordered already.  Your platelet count is chronically low and this and use of the blood thinner will aggravate your bleeding issues.   We will see him in follow-up in 4 to 6 weeks time call sooner should any new difficulties arise.  Please note: late entry documentation due to logistical difficulties during COVID-19 pandemic. This note is filed for information purposes only, and is not intended to be used for billing, nor does it represent the full scope/nature of the visit in question. Please see any associated scanned media linked to date of encounter for additional pertinent information.  Subjective:    HPI: Jeffery Reynolds is a 86 y.o. male presenting to the pulmonology clinic on 09/15/2019 with report of: Pulmonary consult (per Jeffery Reynolds- pt reports of coughing up red blood once daily- mainly in the morning. )     Outpatient Encounter Medications as of 09/15/2019  Medication Sig   [DISCONTINUED] allopurinol  (ZYLOPRIM ) 300 MG tablet TAKE 1 TABLET ON MONDAY, WEDNESDAY AND FRIDAY   [DISCONTINUED] Artificial Tear Ointment (DRY EYES OP) Apply 1 drop to eye 3 (three) times daily. (Patient not taking: Reported on 04/07/2022)   [DISCONTINUED] atorvastatin  (LIPITOR) 40 MG tablet Take 1 tablet (40 mg total) by  mouth daily.   [DISCONTINUED] furosemide  (LASIX ) 20 MG tablet TAKE 1 TABLET BY MOUTH EVERY OTHER DAY FOR FLUID RETENTION   [DISCONTINUED] metoprolol  tartrate (LOPRESSOR ) 50 MG tablet TAKE 1 TABLET(50 MG) BY MOUTH TWICE DAILY   [DISCONTINUED] traMADol  (ULTRAM ) 50 MG tablet Take 1 tablet (50 mg total) by mouth 2 (two) times daily at 8 am and 10 pm. (Patient not taking: Reported on 09/22/2019)   [DISCONTINUED] warfarin (COUMADIN ) 5 MG tablet TAKE 1 TABLET BY MOUTH DAILY OR AS DIRECTED BY COUMADIN  CLINIC (Patient taking differently: Take 5 mg by mouth. TAKE 1 TABLET BY MOUTH all days except for Monday and Friday. Monday and Friday pt takes 2.5 mg.)   [DISCONTINUED] potassium chloride  SA (K-DUR) 20 MEQ tablet One tablet by mouth every other day with furosemide  (Patient not taking: Reported on 09/15/2019)   No facility-administered encounter medications on file as of 09/15/2019.      Objective:   Vitals:   09/15/19 0912  BP: 120/68  Pulse: 75  Temp: (!) 97 F (36.1 C)  Height: 5' 7.5 (1.715 m)  Weight: 206 lb (93.4 kg)  SpO2: 96%  TempSrc: Temporal  BMI (Calculated): 31.77     Physical exam documentation is limited by delayed entry of information.
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
## 2019-09-16 ENCOUNTER — Telehealth: Payer: Self-pay

## 2019-09-16 NOTE — Telephone Encounter (Signed)
Patient made aware of results with verbalized understanding. 

## 2019-09-16 NOTE — Telephone Encounter (Signed)
Patient returning call Patient unable to get into voicemail  Please call to discuss

## 2019-09-16 NOTE — Telephone Encounter (Signed)
-----   Message from Wellington Hampshire, MD sent at 09/16/2019 10:39 AM EST -----  Stable CBC.  No significant anemia in spite of hemoptysis which is good.

## 2019-09-16 NOTE — Telephone Encounter (Signed)
Called to give the patient lab results. DPR on file with ok to lmom. lmom with results.

## 2019-09-17 ENCOUNTER — Other Ambulatory Visit: Payer: Self-pay | Admitting: Internal Medicine

## 2019-09-18 NOTE — Assessment & Plan Note (Signed)
He has persistent pain in his left knee that is debilitating .  Referral to Dr, Roland Rack
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
## 2019-09-18 NOTE — Assessment & Plan Note (Signed)
Etiology unclear if source is sinuses or lungs.  CT sinuses and chest ordered
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
## 2019-09-20 DIAGNOSIS — G4733 Obstructive sleep apnea (adult) (pediatric): Secondary | ICD-10-CM | POA: Diagnosis not present

## 2019-09-20 DIAGNOSIS — R413 Other amnesia: Secondary | ICD-10-CM | POA: Diagnosis not present

## 2019-09-20 DIAGNOSIS — R410 Disorientation, unspecified: Secondary | ICD-10-CM | POA: Diagnosis not present

## 2019-09-20 NOTE — Telephone Encounter (Signed)
Patient previously made aware of Dr. Tyrell Antonio recommendation with verbalized understanding. Lab work and ref to Pulmonology completed.

## 2019-09-21 ENCOUNTER — Other Ambulatory Visit: Payer: Self-pay | Admitting: Neurology

## 2019-09-21 ENCOUNTER — Other Ambulatory Visit: Payer: Self-pay | Admitting: *Deleted

## 2019-09-21 ENCOUNTER — Encounter: Payer: Self-pay | Admitting: *Deleted

## 2019-09-21 DIAGNOSIS — R413 Other amnesia: Secondary | ICD-10-CM

## 2019-09-21 NOTE — Patient Outreach (Signed)
[facility]) Care Management  09/21/2019  Jeffery Reynolds 14-Nov-1933 [id number]   RN Health Coach 2nd attempted follow up outreach call to patient.  Patient was unavailable. HIPPA compliance voicemail message left with return callback number.  Plan: Unsuccessful outreach letter sent RN will call patient again within 30 days.  [place] Care Management [phone]

## 2019-09-21 NOTE — Patient Outreach (Signed)
[facility]) Care Management  [place]  09/21/2019   ELKAN LESSMAN Dec 17, 1933 [id number]  RN Health Coach telephone call to patient.  Hipaa compliance verified. Per patient he is doing pretty good. Patient stated that his Afib has been pretty controlled. RN discussed with patient the complications of uncontrolled AFIB. RN discussed all the symptoms of a stroke and what FAST means. RN discussed the medications the patient is on. Patient has agreed to further outreach calls.  Encounter Medications:  Outpatient Encounter Medications as of 09/21/2019  Medication Sig  . allopurinol (ZYLOPRIM) 300 MG tablet TAKE 1 TABLET ON MONDAY, WEDNESDAY AND FRIDAY  . Artificial Tear Ointment (DRY EYES OP) Apply 1 drop 3 (three) times daily to eye.  Marland Kitchen atorvastatin (LIPITOR) 40 MG tablet Take 1 tablet (40 mg total) by mouth daily.  . furosemide (LASIX) 20 MG tablet TAKE 1 TABLET BY MOUTH EVERY OTHER DAY FOR FLUID RETENTION  . metoprolol tartrate (LOPRESSOR) 50 MG tablet TAKE 1 TABLET(50 MG) BY MOUTH TWICE DAILY  . traMADol (ULTRAM) 50 MG tablet Take 1 tablet (50 mg total) by mouth 2 (two) times daily at 8 am and 10 pm.  . warfarin (COUMADIN) 5 MG tablet TAKE 1 TABLET BY MOUTH DAILY OR AS DIRECTED BY COUMADIN CLINIC (Patient taking differently: Take 5 mg by mouth. TAKE 1 TABLET BY MOUTH all days except for Monday and Friday. Monday and Friday pt takes 2.5 mg.)   No facility-administered encounter medications on file as of 09/21/2019.    Functional Status:  In your present state of health, do you have any difficulty performing the following activities: 09/21/2019 09/24/2018  Hearing? N N  Vision? N N  Difficulty concentrating or making decisions? Y N  Walking or climbing stairs? N N  Dressing or bathing? Y N  Comment tying shoes. His sone [name] -  Doing errands, shopping? N N  Preparing Food and eating ? Y N  Comment [name] -  Using the Toilet? N N  In the  past six months, have you accidently leaked urine? N N  Do you have problems with loss of bowel control? N N  Managing your Medications? Y N  Comment [name] -  Managing your Finances? Y N  Comment [name] -  [place], broadcasting/film/video? Y N  Comment ed assists -  Some recent data might be hidden    Fall/Depression Screening: Fall Risk  09/21/2019 09/06/2019 06/10/2019  Falls in the past year? 1 1 1   Comment - - -  Number falls in past yr: 1 1 1   Injury with Fall? 1 1 0  Comment stitches in head - -  Risk for fall due to : History of fall(s);Impaired balance/gait;Impaired mobility History of fall(s) History of fall(s)  Follow up Falls evaluation completed;Education provided;Falls prevention discussed Falls evaluation completed Falls prevention discussed  Comment - - -   PHQ 2/9 Scores 09/21/2019 09/24/2018 09/23/2017 09/22/2016 01/30/2016 09/11/2015 07/30/2015  PHQ - 2 Score 0 0 0 0 0 0 0   THN CM Care Plan Problem One     Most Recent Value  Care Plan Problem One  Knowledge deficit of self management for atrial fibrillation  Role Documenting the Problem One  [place] for Problem One  Active  THN Long Term Goal   Patient will beable to verbalize symptomsofuncontrolledAFIB within the next 90 days  THN Long Term Goal Start Date  09/21/19  Interventions for Problem One Long Term Goal  RN discusscussedd complications of AFIB. RN went through all the symptoms of a stroke. RN discussed FAST and the meaning. RN sent AFIB booklet. RN will follow up with further discussion  THN CM Short Term Goal #1   Patient will b abletoverbalizehealth maintenace needs within the next 30 days  THN CM Short Term Goal #1 Start Date  09/21/19  Interventions for Short Term Goal #1  RN discussed health maintenance needs. RN discussed with patient about the Covid vaccine. RN assisted patient to get on the waiting list.RN will follow up with further discussion      Assessment:   Patient is taking medications as per ordered Patient is exercising 4x week at gym Patient did not know the signs and symptoms of a stroke Patient will benefit from [place] telephonic outreach for education and support for AFIB self management.  Plan:  RN sent an Atrial Fibrillation booklet RN discussed symptoms of AFIB RN discussed signs of stroke RN Discussed what the FAST method means RN assisted patient to get on the COVID vaccine waiting list RN sent barriers and assessment letter to PCP RN will follow up within the month of April  Danyle Boening [place] Management [phone]

## 2019-09-21 NOTE — Telephone Encounter (Signed)
Refill on tramadol denied.  He received #60 on jan 25 per [place]

## 2019-09-22 ENCOUNTER — Other Ambulatory Visit: Payer: Self-pay

## 2019-09-22 ENCOUNTER — Ambulatory Visit (INDEPENDENT_AMBULATORY_CARE_PROVIDER_SITE_OTHER): Payer: Medicare Other | Admitting: Physician Assistant

## 2019-09-22 ENCOUNTER — Encounter: Payer: Self-pay | Admitting: Physician Assistant

## 2019-09-22 VITALS — BP 122/78 | HR 88 | Ht 68.0 in | Wt 205.0 lb

## 2019-09-22 DIAGNOSIS — E785 Hyperlipidemia, unspecified: Secondary | ICD-10-CM | POA: Diagnosis not present

## 2019-09-22 DIAGNOSIS — D696 Thrombocytopenia, unspecified: Secondary | ICD-10-CM | POA: Diagnosis not present

## 2019-09-22 DIAGNOSIS — I10 Essential (primary) hypertension: Secondary | ICD-10-CM | POA: Diagnosis not present

## 2019-09-22 DIAGNOSIS — I2510 Atherosclerotic heart disease of native coronary artery without angina pectoris: Secondary | ICD-10-CM

## 2019-09-22 DIAGNOSIS — I4510 Unspecified right bundle-branch block: Secondary | ICD-10-CM | POA: Diagnosis not present

## 2019-09-22 DIAGNOSIS — I482 Chronic atrial fibrillation, unspecified: Secondary | ICD-10-CM | POA: Diagnosis not present

## 2019-09-22 NOTE — Patient Instructions (Signed)
Medication Instructions:  No medication changes. *If you need a refill on your cardiac medications before your next appointment, please call your pharmacy*  Lab Work: None ordered If you have labs (blood work) drawn today and your tests are completely normal, you will receive your results only by: Marland Kitchen MyChart Message (if you have MyChart) OR . A paper copy in the mail If you have any lab test that is abnormal or we need to change your treatment, we will call you to review the results.  Testing/Procedures:  Your physician has requested that you have an echocardiogram. Echocardiography is a painless test that uses sound waves to create images of your heart. It provides your doctor with information about the size and shape of your heart and how well your heart's chambers and valves are working. This procedure takes approximately one hour. There are no restrictions for this procedure.    Follow-Up: At [facility], you and your health needs are our priority.  As part of our continuing mission to provide you with exceptional heart care, we have created designated Provider Care Teams.  These Care Teams include your primary Cardiologist (physician) and Advanced Practice Providers (APPs -  Physician Assistants and Nurse Practitioners) who all work together to provide you with the care you need, when you need it.  Your next appointment:   6 month(s)  The format for your next appointment:   In Person  Provider:    You may see Kathlyn Sacramento, MD or one of the following Advanced Practice Providers on your designated Care Team:    Murray Hodgkins, NP  Christell Faith, PA-C  Marrianne Mood, PA-C   Other Instructions  Echocardiogram An echocardiogram is a procedure that uses painless sound waves (ultrasound) to produce an image of the heart. Images from an echocardiogram can provide important information about:  Signs of coronary artery disease (CAD).  Aneurysm detection. An aneurysm is a  weak or damaged part of an artery wall that bulges out from the normal force of blood pumping through the body.  Heart size and shape. Changes in the size or shape of the heart can be associated with certain conditions, including heart failure, aneurysm, and CAD.  Heart muscle function.  Heart valve function.  Signs of a past heart attack.  Fluid buildup around the heart.  Thickening of the heart muscle.  A tumor or infectious growth around the heart valves. Tell a health care provider about:  Any allergies you have.  All medicines you are taking, including vitamins, herbs, eye drops, creams, and over-the-counter medicines.  Any blood disorders you have.  Any surgeries you have had.  Any medical conditions you have.  Whether you are pregnant or may be pregnant. What are the risks? Generally, this is a safe procedure. However, problems may occur, including:  Allergic reaction to dye (contrast) that may be used during the procedure. What happens before the procedure? No specific preparation is needed. You may eat and drink normally. What happens during the procedure?   An IV tube may be inserted into one of your veins.  You may receive contrast through this tube. A contrast is an injection that improves the quality of the pictures from your heart.  A gel will be applied to your chest.  A wand-like tool (transducer) will be moved over your chest. The gel will help to transmit the sound waves from the transducer.  The sound waves will harmlessly bounce off of your heart to allow the heart images  to be captured in real-time motion. The images will be recorded on a computer. The procedure may vary among health care providers and hospitals. What happens after the procedure?  You may return to your normal, everyday life, including diet, activities, and medicines, unless your health care provider tells you not to do that. Summary  An echocardiogram is a procedure that uses  painless sound waves (ultrasound) to produce an image of the heart.  Images from an echocardiogram can provide important information about the size and shape of your heart, heart muscle function, heart valve function, and fluid buildup around your heart.  You do not need to do anything to prepare before this procedure. You may eat and drink normally.  After the echocardiogram is completed, you may return to your normal, everyday life, unless your health care provider tells you not to do that. This information is not intended to replace advice given to you by your health care provider. Make sure you discuss any questions you have with your health care provider. Document Revised: 12/02/2018 Document Reviewed: 09/13/2016 Elsevier Patient Education  [place].

## 2019-09-27 ENCOUNTER — Ambulatory Visit: Payer: Medicare Other

## 2019-09-27 ENCOUNTER — Ambulatory Visit
Admission: RE | Admit: 2019-09-27 | Discharge: 2019-09-27 | Disposition: A | Discharge status: Home / Self Care | Payer: Medicare Other | Source: Ambulatory Visit | Attending: Pulmonary Disease | Admitting: Pulmonary Disease

## 2019-09-27 ENCOUNTER — Ambulatory Visit (INDEPENDENT_AMBULATORY_CARE_PROVIDER_SITE_OTHER): Payer: Medicare Other

## 2019-09-27 ENCOUNTER — Other Ambulatory Visit: Payer: Self-pay

## 2019-09-27 VITALS — Ht 68.0 in | Wt 205.0 lb

## 2019-09-27 DIAGNOSIS — R040 Epistaxis: Secondary | ICD-10-CM | POA: Insufficient documentation

## 2019-09-27 DIAGNOSIS — D696 Thrombocytopenia, unspecified: Secondary | ICD-10-CM | POA: Insufficient documentation

## 2019-09-27 DIAGNOSIS — Z0000 Encounter for general adult medical examination without abnormal findings: Secondary | ICD-10-CM | POA: Diagnosis not present

## 2019-09-27 DIAGNOSIS — R042 Hemoptysis: Secondary | ICD-10-CM | POA: Diagnosis not present

## 2019-09-27 DIAGNOSIS — J342 Deviated nasal septum: Secondary | ICD-10-CM | POA: Diagnosis not present

## 2019-09-27 DIAGNOSIS — J984 Other disorders of lung: Secondary | ICD-10-CM | POA: Diagnosis not present

## 2019-09-27 NOTE — Progress Notes (Signed)
Subjective:   FARAZ PITTSENBARGER is a 86 y.o. male who presents for Medicare Annual/Subsequent preventive examination.  Review of Systems:  No ROS.  Medicare Wellness Virtual Visit.  Visual/audio telehealth visit, UTA vital signs.   Ht/Wt provided.  See social history for additional risk factors.  Cardiac Risk Factors include: advanced age (>77men, >78 women);hypertension;male gender     Objective:    Vitals: Ht 5\' 8"  (1.727 m)   Wt 205 lb (93 kg)   BMI 31.17 kg/m   Body mass index is 31.17 kg/m.  Advanced Directives 09/27/2019 09/21/2019 06/10/2019 09/24/2018 04/16/2018 09/23/2017 02/04/2017  Does Patient Have a Medical Advance Directive? Yes Yes Yes Yes Yes Yes -  Type of Paramedic of [place];Living will - Healthcare Power of [place];Living will [place];Living will Healthcare Power of [place];Living will  Does patient want to make changes to medical advance directive? No - Patient declined No - Patient declined No - Guardian declined No - Patient declined - No - Patient declined -  Copy of [place] in Chart? No - copy requested - - No - copy requested - No - copy requested -  Would patient like information on creating a medical advance directive? - - - - - - -    Tobacco Social History   Tobacco Use  Smoking Status Never Smoker  Smokeless Tobacco Never Used     Counseling given: Not Answered   Clinical Intake:  Pre-visit preparation completed: Yes        Diabetes: No  How often do you need to have someone help you when you read instructions, pamphlets, or other written materials from your doctor or pharmacy?: 4 - Often  Interpreter Needed?: No     Past Medical History:  Diagnosis Date  . Arthritis   . CAD (coronary artery disease)    a. s/p 6-V CABG in [place] in 1995; b. MV 4/12: mild intensity, fixed perfusion defect involving the  basal to mid anterior wall consistent with injury to the diagonal distribution (this finding was also noted on prior study dating back to 2008). Overall, no ischemia was noted and there was no high-risk prognostic indicators based on this study. EF 63%  . Carotid artery disease ([place])    a. ultrasound 12/16: 1-39% bilateral ICA stenosis  . Chronic atrial fibrillation ([place]) 2000   a. CHADS2VASc => 4 (HTN, age x 2, vascular disease); b. on Coumadin [facility]  . Chronic back pain   . Cognitive impairment   . Colon polyps   . Diastolic dysfunction    a. TTE 4/10: EF of [id number], mild diastolic dysfunction, no significant valvular abnormalities, mild ascending aortic root dilatation, moderate left atrial enlargement, technically difficult study  . Genital warts   . GERD (gastroesophageal reflux disease)   . Gout    Controlled with Allopurinol  . History of blood transfusion   . History of chicken pox   . Hyperlipidemia   . Hypertension   . Seasonal allergies   . Sleep apnea, obstructive   . 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 CARDIAC CATHETERIZATION    . CORONARY ARTERY BYPASS GRAFT  1995   CABG x 6 in [place], [place]    . GYNECOMASTIA EXCISION    . KNEE SURGERY     bilateral   . ROTATOR CUFF REPAIR  right  . TONSILLECTOMY AND ADENOIDECTOMY  1943   Family History  Problem Relation Age of Onset  . Heart disease Father   . Heart attack Father 46       MI  . Alcohol abuse Father   . Hyperlipidemia Father   . Hypertension Father   . Heart attack Paternal Grandfather 14  . Alcohol abuse Paternal Grandfather   . Cancer Mother        Breast cancer and Lung Cancer  . Diabetes Brother   . Dementia Brother   . Crohn's disease Daughter   . Cancer Brother        lung cancer  . Alcohol abuse Son   . Alcohol abuse Maternal Grandfather   . Alcohol abuse Son   . Heart attack Paternal Grandmother 50    Social History   Socioeconomic History  . Marital status: Divorced    Spouse name: Not on file  . Number of children: 7  . Years of education: 51  . Highest education level: Not on file  Occupational History  . 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 Smoking status: Never Smoker  . Smokeless tobacco: Never Used  Substance and Sexual Activity  . Alcohol use: Not Currently  . Drug use: No  . Sexual activity: Not Currently  Other Topics Concern  . Not on file  Social History Narrative   Mr. Reategui grew up in [place], [place]. Most recently he was living in [place]. He moved to the [place] area since March 2015. Recently divorced and moved to this area to be close to one of his sons. He enjoys reading and has been going to the gym to exercise.      ives at home with his son, Ed. He has 4 sons. Reagen Longenberger is his POA.   He is able to complete his ADLs except needs some assist at intervals from Ed to put his shoes on. ED completes the iADLs like preparing food, cleaning the home, bills, errands and provides transportation to medical appointments. T   Social Determinants of Health   Financial Resource Strain:   . Difficulty of Paying Living Expenses: Not on file  Food Insecurity: No Food Insecurity  . Worried About Charity fundraiser in the Last Year: Never true  . Ran Out of Food in the Last Year: Never true  Transportation Needs: No Transportation Needs  . Lack of Transportation (Medical): No  . Lack of Transportation (Non-Medical): No  Physical Activity:   . Days of Exercise per Week: Not on file  . Minutes of Exercise per Session: Not on file  Stress:   . Feeling of Stress : Not on file  Social Connections: Slightly Isolated  . Frequency of Communication with Friends and Family: Three times a week  . Frequency of Social Gatherings with Friends and Family: Three times a week  . Attends Religious Services: 1 to 4 times per  year  . Active Member of Clubs or Organizations: Yes  . Attends Archivist Meetings: 1 to 4 times per year  . Marital Status: Divorced    Outpatient Encounter Medications as of 09/27/2019  Medication Sig  . allopurinol (ZYLOPRIM) 300 MG tablet TAKE 1 TABLET ON MONDAY, WEDNESDAY AND FRIDAY  . Artificial Tear Ointment (DRY EYES OP) Apply 1 drop 3 (three) times daily to eye.  Marland Kitchen atorvastatin (LIPITOR) 40 MG tablet Take 1 tablet (40 mg total)  by mouth daily.  . furosemide (LASIX) 20 MG tablet TAKE 1 TABLET BY MOUTH EVERY OTHER DAY FOR FLUID RETENTION  . metoprolol tartrate (LOPRESSOR) 50 MG tablet TAKE 1 TABLET(50 MG) BY MOUTH TWICE DAILY  . warfarin (COUMADIN) 5 MG tablet TAKE 1 TABLET BY MOUTH DAILY OR AS DIRECTED BY COUMADIN CLINIC (Patient taking differently: Take 5 mg by mouth. TAKE 1 TABLET BY MOUTH all days except for Monday and Friday. Monday and Friday pt takes 2.5 mg.)   No facility-administered encounter medications on file as of 09/27/2019.    Activities of Daily Living In your present state of health, do you have any difficulty performing the following activities: 09/27/2019 09/21/2019  Hearing? N N  Vision? N N  Difficulty concentrating or making decisions? Y Y  Comment Dx of dementia -  Walking or climbing stairs? N N  Comment Cane in use as needed. -  Dressing or bathing? Y Y  Comment Son assist with dressing as needed. tying shoes. His sone [name]  Doing errands, shopping? Y N  Comment He does not currently drive. -  Preparing Food and eating ? [name]  Comment Son prepares meals. Self feeds. [name]  Using the Toilet? N N  In the past six months, have you accidently leaked urine? N N  Do you have problems with loss of bowel control? N N  Managing your Medications? Y Y  Comment Son assist [name]  Managing your Finances? Y Y  Comment Son assist [name]  Housekeeping or managing your Housekeeping? Y Y  Comment Son assist ed assists  Some  recent data might be hidden    Patient Care Team: Crecencio Mc, MD as PCP - General (Internal Medicine) Wellington Hampshire, MD as PCP - Cardiology (Cardiology) Pleasant, Eppie Gibson, RN as [place] Management   Assessment:   This is a routine wellness examination for Leodis.  Nurse connected with patient 09/27/19 at 11:30 AM EST by a telephone enabled telemedicine application and verified that I am speaking with the correct person using two identifiers. Patient stated full name and DOB. Patient gave permission to continue with virtual visit. Information also received from son, [name] (HIPAA compliant). Patient's location was at home and Nurse's location was at [place] office.   Patient is alert. Patient does have difficulty remembering, focusing and concentrating. Recent appointment with Neurology, Dr. Melrose Nakayama.  Health Maintenance Due: -See completed HM at the end of note.   Eye: -Visual acuity not assessed. Virtual visit. Followed by their ophthalmologist.  Dental: -UTD.    Hearing: Demonstrates normal hearing during visit.  Safety:  Patient feels safe at home- yes Patient does have smoke detectors at home- yes Patient does wear sunscreen or protective clothing when in direct sunlight - yes Patient does wear seat belt when in a moving vehicle - yes Patient drives- not currently Adequate lighting in walkways free from debris- yes Grab bars and handrails used as appropriate- yes Ambulates with an assistive device- yes; cane as needed  Social: Alcohol intake - not currently   Smoking history- never   Smokers in home? none Illicit drug use? none  Medication: Tramadol on hold per preference. Son states patient's cognitive has slightly improved since Tramadol. Plans to continue holding until follow up with pcp. Son assists with medication.  Covid-19: Precautions and sickness symptoms discussed. Wears mask, social distancing, hand hygiene as appropriate.    Activities of Daily Living Patient denies needing assistance  with: feeding themselves, getting from bed to chair, getting to the toilet, bathing/showering. Assisted by son with household chores, managing money, dressing (as needed) and preparing meals.  Discussed the importance of a healthy diet, water intake and the benefits of aerobic exercise.   Physical activity- patient states he is resting his body due to pain and currently not exercising.  Diet:  Regular Water: very good intake  Other Providers Patient Care Team: Crecencio Mc, MD as PCP - General (Internal Medicine) Wellington Hampshire, MD as PCP - Cardiology (Cardiology) Pleasant, Eppie Gibson, RN as [place] Management  Exercise Activities and Dietary recommendations Current Exercise Habits: The patient does not participate in regular exercise at present(Currently resting due to  body pain. Plans to start exercising when able.)  Goals    . Weight less than 200lb     Low carb diet, low fat diet Portion control       Fall Risk Fall Risk  09/27/2019 09/21/2019 09/06/2019 06/10/2019 09/24/2018  Falls in the past year? 1 1 1 1 1   Comment No falls since last reported in the last 5 days ago. - - - No falls since the last 8 months ago.   Number falls in past yr: - 1 1 1  0  Injury with Fall? - 1 1 0 1  Comment - stitches in head - - Followed by pcp.  Exercise machine gave way during a work out.   Risk for fall due to : - History of fall(s);Impaired balance/gait;Impaired mobility History of fall(s) History of fall(s) -  Follow up Falls evaluation completed Falls evaluation completed;Education provided;Falls prevention discussed Falls evaluation completed Falls prevention discussed -  Comment - - - - -   Timed Get Up and Go Performed: no, virtual visit  Depression Screen PHQ 2/9 Scores 09/21/2019 09/24/2018 09/23/2017 09/22/2016  PHQ - 2 Score 0 0 0 0    Cognitive Function MMSE - Mini Mental State Exam  09/27/2019 09/23/2017 09/22/2016 09/11/2015  Not completed: Unable to complete - - -  Orientation to time - 5 5 5   Orientation to Place - 5 5 5   Registration - 3 3 3   Attention/ Calculation - 5 5 5   Recall - 3 1 3   Recall-comments - - 1 out of 3 words recalled -  Language- name 2 objects - 2 2 2   Language- repeat - 1 1 1   Language- follow 3 step command - 3 3 3   Language- read & follow direction - 1 1 1   Write a sentence - 1 1 1   Copy design - 1 1 1   Total score - 30 28 30      6CIT Screen 09/24/2018  What Year? 0 points  What month? 0 points  What time? 0 points  Count back from 20 0 points  Months in reverse 0 points  Repeat phrase 0 points  Total Score 0    Immunization History  Administered Date(s) Administered  . Influenza, High Dose Seasonal PF 05/26/2016, 07/10/2017, 05/05/2018  . Influenza,inj,Quad PF,6+ Mos 06/10/2014  . Influenza-Unspecified 06/30/2015  . Pneumococcal Conjugate-13 08/10/2013  . Pneumococcal Polysaccharide-23 08/14/2014  . Tdap 11/12/2013, 04/16/2018  . Zoster 04/19/2014   Screening Tests Health Maintenance  Topic Date Due  . INFLUENZA VACCINE  11/23/2019 (Originally 03/26/2019)  . TETANUS/TDAP  04/16/2028  . PNA vac Low Risk Adult  Completed       Plan:   Keep all routine maintenance appointments.   Follow up 10/10/19 @ 230.  Discuss medication for dementia and tramadol.   Alzheimer's and dementia information mailed per request.   Medicare Attestation I have personally reviewed: The patient's medical and social history Their use of alcohol, tobacco or illicit drugs Their current medications and supplements The patient's functional ability including ADLs,fall risks, home safety risks, cognitive, and hearing and visual impairment Diet and physical activities Evidence for depression   I have reviewed and discussed with patient certain preventive protocols, quality metrics, and best practice recommendations.      Varney Biles,  LPN  [id number]

## 2019-09-27 NOTE — Patient Instructions (Addendum)
  Jeffery Reynolds , Thank you for taking time to come for your Medicare Wellness Visit. I appreciate your ongoing commitment to your health goals. Please review the following plan we discussed and let me know if I can assist you in the future.   These are the goals we discussed: Goals    . Weight less than 200lb     Low carb diet, low fat diet Portion control       This is a list of the screening recommended for you and due dates:  Health Maintenance  Topic Date Due  . Flu Shot  11/23/2019*  . Tetanus Vaccine  04/16/2028  . Pneumonia vaccines  Completed  *Topic was postponed. The date shown is not the original due date.

## 2019-09-28 ENCOUNTER — Ambulatory Visit: Payer: Medicare Other

## 2019-09-28 ENCOUNTER — Ambulatory Visit: Payer: Medicare Other | Admitting: Internal Medicine

## 2019-09-29 ENCOUNTER — Telehealth: Payer: Self-pay

## 2019-09-29 ENCOUNTER — Encounter: Payer: Self-pay | Admitting: Internal Medicine

## 2019-09-29 DIAGNOSIS — R0602 Shortness of breath: Secondary | ICD-10-CM | POA: Diagnosis not present

## 2019-09-29 DIAGNOSIS — G4733 Obstructive sleep apnea (adult) (pediatric): Secondary | ICD-10-CM | POA: Diagnosis not present

## 2019-09-29 LAB — PULMONARY FUNCTION TEST

## 2019-09-29 NOTE — Telephone Encounter (Signed)
Called pt to ask if we could move his appt to 1045 on 10/12/2019. [id number]

## 2019-09-30 ENCOUNTER — Encounter: Payer: Self-pay | Admitting: Internal Medicine

## 2019-09-30 ENCOUNTER — Ambulatory Visit
Admission: RE | Admit: 2019-09-30 | Discharge: 2019-09-30 | Disposition: A | Discharge status: Home / Self Care | Payer: Medicare Other | Source: Ambulatory Visit | Attending: Neurology | Admitting: Neurology

## 2019-09-30 ENCOUNTER — Other Ambulatory Visit: Payer: Self-pay

## 2019-09-30 DIAGNOSIS — R413 Other amnesia: Secondary | ICD-10-CM | POA: Diagnosis not present

## 2019-09-30 DIAGNOSIS — G4733 Obstructive sleep apnea (adult) (pediatric): Secondary | ICD-10-CM | POA: Diagnosis not present

## 2019-09-30 DIAGNOSIS — R0602 Shortness of breath: Secondary | ICD-10-CM | POA: Diagnosis not present

## 2019-10-03 ENCOUNTER — Other Ambulatory Visit: Payer: Self-pay | Admitting: Internal Medicine

## 2019-10-04 ENCOUNTER — Other Ambulatory Visit: Payer: Self-pay | Admitting: Internal Medicine

## 2019-10-07 DIAGNOSIS — R569 Unspecified convulsions: Secondary | ICD-10-CM | POA: Diagnosis not present

## 2019-10-10 ENCOUNTER — Encounter: Payer: Self-pay | Admitting: Internal Medicine

## 2019-10-10 ENCOUNTER — Other Ambulatory Visit: Payer: Self-pay

## 2019-10-10 ENCOUNTER — Ambulatory Visit (INDEPENDENT_AMBULATORY_CARE_PROVIDER_SITE_OTHER): Payer: Medicare Other | Admitting: Internal Medicine

## 2019-10-10 VITALS — BP 108/70 | HR 101 | Temp 98.3°F | Wt 203.4 lb

## 2019-10-10 DIAGNOSIS — M25562 Pain in left knee: Secondary | ICD-10-CM

## 2019-10-10 DIAGNOSIS — I70219 Atherosclerosis of native arteries of extremities with intermittent claudication, unspecified extremity: Secondary | ICD-10-CM

## 2019-10-10 DIAGNOSIS — G3184 Mild cognitive impairment, so stated: Secondary | ICD-10-CM

## 2019-10-10 DIAGNOSIS — I2510 Atherosclerotic heart disease of native coronary artery without angina pectoris: Secondary | ICD-10-CM | POA: Diagnosis not present

## 2019-10-10 DIAGNOSIS — G8929 Other chronic pain: Secondary | ICD-10-CM

## 2019-10-10 DIAGNOSIS — G4733 Obstructive sleep apnea (adult) (pediatric): Secondary | ICD-10-CM

## 2019-10-10 DIAGNOSIS — M720 Palmar fascial fibromatosis [Dupuytren]: Secondary | ICD-10-CM

## 2019-10-10 NOTE — Patient Instructions (Addendum)
I do not recommend resuming driving at this time  After your sleep apnea has been adequately treated,  You MIGHT be able to resume driving  Please follow up with Dr Melrose Nakayama
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
## 2019-10-11 DIAGNOSIS — I70219 Atherosclerosis of native arteries of extremities with intermittent claudication, unspecified extremity: Secondary | ICD-10-CM | POA: Insufficient documentation

## 2019-10-11 NOTE — Assessment & Plan Note (Addendum)
He received a steroid injection June 20`9 form [facility]  Has stopped taking tramadol due to recent AMS incident

## 2019-10-11 NOTE — Assessment & Plan Note (Signed)
Diagnosis of MCI vs vascular dementia (given MRI findings) IN question by Neurology.  EEG results pending.  I have advised patient and son to continue the NO DRIVING STATUS given his lack of judgement and short term /attention deficits.

## 2019-10-11 NOTE — Assessment & Plan Note (Signed)
Reportedly severe,  Awaiting report. He is in need of new CPAP machine.  Untreated apnea may be contributing to his daytime confusion

## 2019-10-12 ENCOUNTER — Encounter: Payer: Self-pay | Admitting: Pulmonary Disease

## 2019-10-12 ENCOUNTER — Other Ambulatory Visit: Payer: Self-pay

## 2019-10-12 ENCOUNTER — Ambulatory Visit: Payer: Medicare Other | Admitting: Pulmonary Disease

## 2019-10-12 VITALS — BP 118/64 | HR 85 | Temp 98.0°F | Ht 68.0 in | Wt 203.8 lb

## 2019-10-12 DIAGNOSIS — G4733 Obstructive sleep apnea (adult) (pediatric): Secondary | ICD-10-CM

## 2019-10-12 DIAGNOSIS — Z9989 Dependence on other enabling machines and devices: Secondary | ICD-10-CM

## 2019-10-12 NOTE — Patient Instructions (Signed)
You can return here as needed. Call if you develop any new or worsen symptoms.

## 2019-10-13 ENCOUNTER — Other Ambulatory Visit: Payer: Medicare Other

## 2019-10-14 ENCOUNTER — Telehealth: Payer: Self-pay | Admitting: Internal Medicine

## 2019-10-14 DIAGNOSIS — R4181 Age-related cognitive decline: Secondary | ICD-10-CM

## 2019-10-14 DIAGNOSIS — G4710 Hypersomnia, unspecified: Secondary | ICD-10-CM

## 2019-10-14 DIAGNOSIS — G4733 Obstructive sleep apnea (adult) (pediatric): Secondary | ICD-10-CM

## 2019-10-14 NOTE — Telephone Encounter (Signed)
No driving until his CPAP has been treated for 2 weeks..  AND  One of the sons needs ot feel comfortable riding with him before they allow him to drive alone.    I am not confident  that a machine is being sent to house since the last page of the report recommended a CPAP study ,  But if they get the machine, they can take it to the cpap titration study that I ordered

## 2019-10-14 NOTE — Telephone Encounter (Signed)
Spoke with pt's son and informed him of Dr. Lupita Dawn message below. Pt's son gave a verbal understanding.

## 2019-10-14 NOTE — Telephone Encounter (Signed)
Home Sleep study report received,  patient notified via mychart  ; cpap titration study ordered

## 2019-10-14 NOTE — Telephone Encounter (Signed)
Spoke with pt's son to let him know that the sleep study did show severe sleep apnea and that a CPAP titration is needed and has been ordered. Raquel Sarna, son, stated that the pt told him today that there is going to be a CPAP machine brought to his house on Monday.   Also while on the phone with Raquel Sarna he mentioned that his dad has been off of the Tramadol for about two weeks now and has not mentioned anything about being in pain. He stated that he also is not having any more hallucinations and seems to be more clear minded. Raquel Sarna is wondering what would be the stipulations to the pt being able to drive again.

## 2019-10-18 ENCOUNTER — Other Ambulatory Visit: Payer: Self-pay | Admitting: Internal Medicine

## 2019-10-19 DIAGNOSIS — R399 Unspecified symptoms and signs involving the genitourinary system: Secondary | ICD-10-CM | POA: Diagnosis not present

## 2019-10-19 DIAGNOSIS — E038 Other specified hypothyroidism: Secondary | ICD-10-CM | POA: Diagnosis not present

## 2019-10-19 DIAGNOSIS — R410 Disorientation, unspecified: Secondary | ICD-10-CM | POA: Diagnosis not present

## 2019-10-19 DIAGNOSIS — E559 Vitamin D deficiency, unspecified: Secondary | ICD-10-CM | POA: Diagnosis not present

## 2019-10-19 DIAGNOSIS — E611 Iron deficiency: Secondary | ICD-10-CM | POA: Diagnosis not present

## 2019-10-19 DIAGNOSIS — R569 Unspecified convulsions: Secondary | ICD-10-CM | POA: Diagnosis not present

## 2019-10-19 DIAGNOSIS — E519 Thiamine deficiency, unspecified: Secondary | ICD-10-CM | POA: Diagnosis not present

## 2019-10-19 DIAGNOSIS — E538 Deficiency of other specified B group vitamins: Secondary | ICD-10-CM | POA: Diagnosis not present

## 2019-10-20 DIAGNOSIS — E038 Other specified hypothyroidism: Secondary | ICD-10-CM | POA: Diagnosis not present

## 2019-10-20 DIAGNOSIS — E611 Iron deficiency: Secondary | ICD-10-CM | POA: Diagnosis not present

## 2019-10-20 DIAGNOSIS — R569 Unspecified convulsions: Secondary | ICD-10-CM | POA: Diagnosis not present

## 2019-10-20 DIAGNOSIS — R410 Disorientation, unspecified: Secondary | ICD-10-CM | POA: Diagnosis not present

## 2019-10-20 DIAGNOSIS — R399 Unspecified symptoms and signs involving the genitourinary system: Secondary | ICD-10-CM | POA: Diagnosis not present

## 2019-10-20 DIAGNOSIS — E559 Vitamin D deficiency, unspecified: Secondary | ICD-10-CM | POA: Diagnosis not present

## 2019-10-20 DIAGNOSIS — E538 Deficiency of other specified B group vitamins: Secondary | ICD-10-CM | POA: Diagnosis not present

## 2019-10-20 DIAGNOSIS — E519 Thiamine deficiency, unspecified: Secondary | ICD-10-CM | POA: Diagnosis not present

## 2019-10-25 ENCOUNTER — Telehealth: Payer: Self-pay | Admitting: Internal Medicine

## 2019-10-25 DIAGNOSIS — E538 Deficiency of other specified B group vitamins: Secondary | ICD-10-CM | POA: Diagnosis not present

## 2019-10-25 NOTE — Telephone Encounter (Signed)
Pt called and want a call back.

## 2019-10-25 NOTE — Telephone Encounter (Signed)
Spoke with pt's son and he stated that pt has been having diarrhea for about two weeks. I have scheduled pt for a virtual visit with Dr. Caryl Bis tomorrow at 3:15pm. Is this okay?

## 2019-10-25 NOTE — Telephone Encounter (Signed)
Yes that is fine.  Bland Rudzinski,cma

## 2019-10-25 NOTE — Telephone Encounter (Signed)
Called pt back at the number that was listed below the message. Pt stated that he did not need me to call him back. He stated that he just called about his appt for tomorrow with Dr. Caryl Bis. I let the pt know that he has an appt scheduled with Dr. Caryl Bis at 3:15pm to discuss the diarrhea he has been having and I also let him know that the visit will be a virtual visit. I explained to the pt that his son, Raquel Sarna, is aware of the appt and that he will be there to help him get the visit started. I then called pt's son to see if he called and spoke with Bridgett because the pt does have memory issues but the pt's son did not answer the phone.

## 2019-10-25 NOTE — Telephone Encounter (Signed)
Pt's son Zeeshan Eyerman that lives with patient is requesting a call back. He said pt has had diarrhea for the past 3 weeks. He is on a bland diet and he thinks something needs to be done. I don't see any available appt soon. Please advise.

## 2019-10-26 ENCOUNTER — Ambulatory Visit (INDEPENDENT_AMBULATORY_CARE_PROVIDER_SITE_OTHER): Payer: Medicare Other

## 2019-10-26 ENCOUNTER — Telehealth (INDEPENDENT_AMBULATORY_CARE_PROVIDER_SITE_OTHER): Payer: Medicare Other | Admitting: Family Medicine

## 2019-10-26 ENCOUNTER — Ambulatory Visit: Payer: Medicare Other | Attending: Internal Medicine

## 2019-10-26 ENCOUNTER — Encounter: Payer: Self-pay | Admitting: Family Medicine

## 2019-10-26 ENCOUNTER — Other Ambulatory Visit: Payer: Self-pay

## 2019-10-26 ENCOUNTER — Other Ambulatory Visit
Admission: RE | Admit: 2019-10-26 | Discharge: 2019-10-26 | Disposition: A | Discharge status: Home / Self Care | Payer: Medicare Other | Source: Ambulatory Visit | Attending: Family Medicine | Admitting: Family Medicine

## 2019-10-26 VITALS — Ht 68.0 in | Wt 208.0 lb

## 2019-10-26 DIAGNOSIS — R197 Diarrhea, unspecified: Secondary | ICD-10-CM | POA: Insufficient documentation

## 2019-10-26 DIAGNOSIS — Z20822 Contact with and (suspected) exposure to covid-19: Secondary | ICD-10-CM | POA: Diagnosis not present

## 2019-10-26 DIAGNOSIS — Z5181 Encounter for therapeutic drug level monitoring: Secondary | ICD-10-CM

## 2019-10-26 DIAGNOSIS — I4819 Other persistent atrial fibrillation: Secondary | ICD-10-CM | POA: Diagnosis not present

## 2019-10-26 DIAGNOSIS — I2510 Atherosclerotic heart disease of native coronary artery without angina pectoris: Secondary | ICD-10-CM

## 2019-10-26 LAB — CBC WITH DIFFERENTIAL/PLATELET
Abs Immature Granulocytes: 0.02 10*3/uL (ref 0.00–0.07)
Basophils Absolute: 0 10*3/uL (ref 0.0–0.1)
Basophils Relative: 0 %
Eosinophils Absolute: 0.1 10*3/uL (ref 0.0–0.5)
Eosinophils Relative: 3 %
HCT: 43.3 % (ref 39.0–52.0)
Hemoglobin: 14.1 g/dL (ref 13.0–17.0)
Immature Granulocytes: 1 %
Lymphocytes Relative: 30 %
Lymphs Abs: 1.2 10*3/uL (ref 0.7–4.0)
MCH: 32.8 pg (ref 26.0–34.0)
MCHC: 32.6 g/dL (ref 30.0–36.0)
MCV: 100.7 fL — ABNORMAL HIGH (ref 80.0–100.0)
Monocytes Absolute: 0.6 10*3/uL (ref 0.1–1.0)
Monocytes Relative: 14 %
Neutro Abs: 2.2 10*3/uL (ref 1.7–7.7)
Neutrophils Relative %: 52 %
Platelets: 107 10*3/uL — ABNORMAL LOW (ref 150–400)
RBC: 4.3 MIL/uL (ref 4.22–5.81)
RDW: 13.9 % (ref 11.5–15.5)
WBC: 4.1 10*3/uL (ref 4.0–10.5)
nRBC: 0 % (ref 0.0–0.2)

## 2019-10-26 LAB — COMPREHENSIVE METABOLIC PANEL
ALT: 25 U/L (ref 0–44)
AST: 20 U/L (ref 15–41)
Albumin: 3.9 g/dL (ref 3.5–5.0)
Alkaline Phosphatase: 97 U/L (ref 38–126)
Anion gap: 11 (ref 5–15)
BUN: 15 mg/dL (ref 8–23)
CO2: 24 mmol/L (ref 22–32)
Calcium: 9 mg/dL (ref 8.9–10.3)
Chloride: 106 mmol/L (ref 98–111)
Creatinine, Ser: 1.32 mg/dL — ABNORMAL HIGH (ref 0.61–1.24)
GFR calc Af Amer: 57 mL/min — ABNORMAL LOW (ref >60)
GFR calc non Af Amer: 49 mL/min — ABNORMAL LOW (ref >60)
Glucose, Bld: 82 mg/dL (ref 70–99)
Potassium: 3.6 mmol/L (ref 3.5–5.1)
Sodium: 141 mmol/L (ref 135–145)
Total Bilirubin: 1.7 mg/dL — ABNORMAL HIGH (ref 0.3–1.2)
Total Protein: 6.5 g/dL (ref 6.5–8.1)

## 2019-10-26 LAB — POCT INR: INR: 2.5 (ref 2.0–3.0)

## 2019-10-26 NOTE — Patient Instructions (Signed)
Please continue dosage of 1 tablet every day EXCEPT 1/2 tablet on [place].  Please be consistent w/ your greens intake - pick a day each week to have your greens and have them every week on that same day.  Recheck in 6 weeks.

## 2019-10-26 NOTE — Progress Notes (Signed)
Virtual Visit via video Note  This visit type was conducted due to national recommendations for restrictions regarding the COVID-19 pandemic (e.g. social distancing).  This format is felt to be most appropriate for this patient at this time.  All issues noted in this document were discussed and addressed.  No physical exam was performed (except for noted visual exam findings with Video Visits).   I connected with Jeffery Reynolds today at  3:15 PM EST by a video enabled telemedicine application and verified that I am speaking with the correct person using two identifiers. Location patient: home Location provider: work Persons participating in the virtual visit: patient, provider, Mamie Diiorio (son), Teven Mittman (son)  I discussed the limitations, risks, security and privacy concerns of performing an evaluation and management service by telephone and the availability of in person appointments. I also discussed with the patient that there may be a patient responsible charge related to this service. The patient expressed understanding and agreed to proceed.  Reason for visit: Same day visit  HPI: Diarrhea: Patient notes this started about 3 weeks ago.  Every 2 to 3 days he will have severe episodes of diarrhea.  He has significant urgency and sometimes he is not able to make it to the bathroom.  They started on Imodium yesterday and notes he has not had any significant diarrhea since starting on that.  He is a little lethargic today though has not been sleeping well getting used to his CPAP.  They stopped his tramadol and it seems to have helped with the lethargy.  No abdominal pain, blood in stool, nausea, vomiting, fever, shortness of breath, other medication changes or COVID-19 exposure.  He does note a little bit of chronic congestion and has had a mild cough since the diarrhea started 3 weeks ago.  He has no known sick contacts.  He reports he is urinating normally.   ROS: See pertinent positives  and negatives per HPI.  Past Medical History:  Diagnosis Date  . Arthritis   . CAD (coronary artery disease)    a. s/p 6-V CABG in [place] in 1995; b. MV 4/12: mild intensity, fixed perfusion defect involving the basal to mid anterior wall consistent with injury to the diagonal distribution (this finding was also noted on prior study dating back to 2008). Overall, no ischemia was noted and there was no high-risk prognostic indicators based on this study. EF 63%  . Carotid artery disease ([place])    a. ultrasound 12/16: 1-39% bilateral ICA stenosis  . Chronic atrial fibrillation ([place]) 2000   a. CHADS2VASc => 4 (HTN, age x 2, vascular disease); b. on Coumadin [facility]  . Chronic back pain   . Cognitive impairment   . Colon polyps   . Diastolic dysfunction    a. TTE 4/10: EF of 38%, mild diastolic dysfunction, no significant valvular abnormalities, mild ascending aortic root dilatation, moderate left atrial enlargement, technically difficult study  . Genital warts   . GERD (gastroesophageal reflux disease)   . Gout    Controlled with Allopurinol  . History of blood transfusion   . History of chicken pox   . Hyperlipidemia   . Hypertension   . Seasonal allergies   . Sleep apnea, obstructive   . 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 CARDIAC CATHETERIZATION    . CORONARY ARTERY BYPASS GRAFT  1995   CABG x 6 in [place], [place]  SURGERY    . GYNECOMASTIA EXCISION    . KNEE SURGERY     bilateral   . ROTATOR CUFF REPAIR     right  . TONSILLECTOMY AND ADENOIDECTOMY  1943    Family History  Problem Relation Age of Onset  . Heart disease Father   . Heart attack Father 43       MI  . Alcohol abuse Father   . Hyperlipidemia Father   . Hypertension Father   . Heart attack Paternal Grandfather 105  . Alcohol abuse Paternal Grandfather   . Cancer Mother        Breast cancer and Lung Cancer  . Diabetes Brother   .  Dementia Brother   . Crohn's disease Daughter   . Cancer Brother        lung cancer  . Alcohol abuse Son   . Alcohol abuse Maternal Grandfather   . Alcohol abuse Son   . Heart attack Paternal Grandmother 65    SOCIAL HX: Non-smoker   Current Outpatient Medications:  .  allopurinol (ZYLOPRIM) 300 MG tablet, TAKE 1 TABLET ON MONDAY, WEDNESDAY AND FRIDAY, Disp: 12 tablet, Rfl: 2 .  Artificial Tear Ointment (DRY EYES OP), Apply 1 drop 3 (three) times daily to eye., Disp: , Rfl:  .  atorvastatin (LIPITOR) 40 MG tablet, TAKE 1 TABLET BY MOUTH EVERY DAY, Disp: 90 tablet, Rfl: 1 .  furosemide (LASIX) 20 MG tablet, TAKE 1 TABLET BY MOUTH EVERY OTHER DAY FOR FLUID RETENTION, Disp: 45 tablet, Rfl: 2 .  metoprolol tartrate (LOPRESSOR) 50 MG tablet, TAKE 1 TABLET(50 MG) BY MOUTH TWICE DAILY, Disp: 180 tablet, Rfl: 1 .  Pedialyte (PEDIALYTE) SOLN, Take 240 mLs by mouth., Disp: , Rfl:  .  warfarin (COUMADIN) 5 MG tablet, Take 1 tablet (5 mg total) by mouth daily at 6 PM. TAKE 1 TABLET BY MOUTH all days except for Monday and Friday. Monday and Friday pt takes 2.5 mg., Disp: 90 tablet, Rfl: 1  EXAM:  VITALS per patient if applicable:  GENERAL: alert, oriented, appears well and in no acute distress  HEENT: atraumatic, conjunttiva clear, no obvious abnormalities on inspection of external nose and ears  LUNGS: on inspection no signs of respiratory distress, breathing rate appears normal, no obvious gross SOB, gasping or wheezing  CV: no obvious cyanosis  MS: moves all visible extremities without noticeable abnormality  PSYCH/NEURO: pleasant and cooperative, no obvious depression or anxiety, speech and thought processing grossly intact  ASSESSMENT AND PLAN:  Discussed the following assessment and plan:  Diarrhea Ongoing symptoms for several weeks.  There is a concern for potential infectious process.  Discussed that this could represent a stool infection, viral illness, or COVID-19.  Encouraged  him to get him tested for COVID-19 and the website was given for scheduling an appointment.  Discussed that I was we suspicious for COVID-19 though diarrhea can be a presenting symptom.  Advised that he stay quarantined until we get that result.  Discussed going to the lab at the hospital for lab work as well to help evaluate for dehydration and to obtain stool studies.  Encouraged adequate hydration.  They will continue with adequate water and Pedialyte intake.  Discussed reasons to seek medical attention in the emergency department.  Advised to discontinue Imodium use at this time given risk of toxic megacolon if he does have an infectious process.   Orders Placed This Encounter  Procedures  . GI pathogen panel by PCR, stool  Standing Status:   Future    Number of Occurrences:   1    Standing Expiration Date:   10/25/2020  . Comp Met (CMET)    Standing Status:   Future    Number of Occurrences:   1    Standing Expiration Date:   10/25/2020  . CBC with Differential/Platelet    Standing Status:   Future    Number of Occurrences:   1    Standing Expiration Date:   10/25/2020    No orders of the defined types were placed in this encounter.    I discussed the assessment and treatment plan with the patient. The patient was provided an opportunity to ask questions and all were answered. The patient agreed with the plan and demonstrated an understanding of the instructions.   The patient was advised to call back or seek an in-person evaluation if the symptoms worsen or if the condition fails to improve as anticipated.   Tommi Rumps, MD

## 2019-10-26 NOTE — Assessment & Plan Note (Addendum)
Ongoing symptoms for several weeks.  There is a concern for potential infectious process.  Discussed that this could represent a stool infection, viral illness, or COVID-19.  Encouraged him to get him tested for COVID-19 and the website was given for scheduling an appointment.  Discussed that I was we suspicious for COVID-19 though diarrhea can be a presenting symptom.  Advised that he stay quarantined until we get that result.  Discussed going to the lab at the hospital for lab work as well to help evaluate for dehydration and to obtain stool studies.  Encouraged adequate hydration.  They will continue with adequate water and Pedialyte intake.  Discussed reasons to seek medical attention in the emergency department.  Advised to discontinue Imodium use at this time given risk of toxic megacolon if he does have an infectious process.

## 2019-10-27 LAB — SPECIMEN STATUS REPORT

## 2019-10-27 LAB — NOVEL CORONAVIRUS, NAA: SARS-CoV-2, NAA: NOT DETECTED

## 2019-10-28 DIAGNOSIS — E611 Iron deficiency: Secondary | ICD-10-CM | POA: Diagnosis not present

## 2019-10-28 DIAGNOSIS — E038 Other specified hypothyroidism: Secondary | ICD-10-CM | POA: Diagnosis not present

## 2019-10-28 DIAGNOSIS — R399 Unspecified symptoms and signs involving the genitourinary system: Secondary | ICD-10-CM | POA: Diagnosis not present

## 2019-10-28 DIAGNOSIS — E519 Thiamine deficiency, unspecified: Secondary | ICD-10-CM | POA: Diagnosis not present

## 2019-10-28 DIAGNOSIS — E559 Vitamin D deficiency, unspecified: Secondary | ICD-10-CM | POA: Diagnosis not present

## 2019-10-28 DIAGNOSIS — R410 Disorientation, unspecified: Secondary | ICD-10-CM | POA: Diagnosis not present

## 2019-10-28 DIAGNOSIS — R569 Unspecified convulsions: Secondary | ICD-10-CM | POA: Diagnosis not present

## 2019-10-28 DIAGNOSIS — E538 Deficiency of other specified B group vitamins: Secondary | ICD-10-CM | POA: Diagnosis not present

## 2019-10-31 LAB — GI PATHOGEN PANEL BY PCR, STOOL
Adenovirus F 40/41: NOT DETECTED
Astrovirus: NOT DETECTED
Campylobacter by PCR: DETECTED — AB
Cryptosporidium by PCR: NOT DETECTED
Cyclospora cayetanensis: NOT DETECTED
E coli (ETEC) LT/ST: NOT DETECTED
E coli (STEC): NOT DETECTED
Entamoeba histolytica: NOT DETECTED
Enteroaggregative E coli: NOT DETECTED
Enteropathogenic E coli: NOT DETECTED
G lamblia by PCR: NOT DETECTED
Norovirus GI/GII: NOT DETECTED
Plesiomonas shigelloides: NOT DETECTED
Rotavirus A by PCR: NOT DETECTED
Salmonella by PCR: NOT DETECTED
Sapovirus: NOT DETECTED
Shigella by PCR: NOT DETECTED
Vibrio cholerae: NOT DETECTED
Vibrio: NOT DETECTED
Yersinia enterocolitica: NOT DETECTED

## 2019-11-07 ENCOUNTER — Telehealth: Payer: Self-pay | Admitting: Internal Medicine

## 2019-11-07 NOTE — Telephone Encounter (Signed)
Needing result from [facility] from January 2021. So I send order in for Cpap order from 10/14/2019.  Please and Thank you!

## 2019-11-08 ENCOUNTER — Telehealth: Payer: Self-pay | Admitting: Internal Medicine

## 2019-11-08 NOTE — Telephone Encounter (Signed)
Pt's son called and stated that pt was having severe headache on top of his head and going up the right side of his head. Also noted earache. He states that it started this morning. I transferred to access nurse.

## 2019-11-08 NOTE — Telephone Encounter (Signed)
Talked with patient son he is going to take patient to UC.

## 2019-11-08 NOTE — Telephone Encounter (Addendum)
He should go to urgent care to have ears, neck  examined .  Pain could also be coming from his neck.

## 2019-11-08 NOTE — Telephone Encounter (Signed)
Patient a woke this morning with headache on right side , described as sharpe pains coming and going , lasting about 3 hours, rated at 7 on 0 to 10 pain scale. Pain has completely stopped at this time, denies any other symptoms. Advised patient to take BP while on phone 119/51 pulse, 64. Recommendation from Access Nurse  To be sen within 24 hours no appointment available. Advised patient if pain return needs to be evaluated at [facility] That I would advise PCP, for recommendations.

## 2019-11-08 NOTE — Telephone Encounter (Signed)
Access Nurse called back and gave call to [facility]

## 2019-11-09 NOTE — Telephone Encounter (Signed)
So I the only sleep study reports that we have are from 09/29/2019 and 09/30/2019. I have printed them off if you need them please let me know and I will give them to you when you get back.

## 2019-11-22 DIAGNOSIS — R569 Unspecified convulsions: Secondary | ICD-10-CM | POA: Diagnosis not present

## 2019-11-23 ENCOUNTER — Other Ambulatory Visit: Payer: Self-pay

## 2019-11-23 ENCOUNTER — Ambulatory Visit (INDEPENDENT_AMBULATORY_CARE_PROVIDER_SITE_OTHER): Payer: Medicare Other

## 2019-11-23 DIAGNOSIS — I4510 Unspecified right bundle-branch block: Secondary | ICD-10-CM

## 2019-11-23 DIAGNOSIS — I2510 Atherosclerotic heart disease of native coronary artery without angina pectoris: Secondary | ICD-10-CM | POA: Diagnosis not present

## 2019-11-23 MED ORDER — PERFLUTREN LIPID MICROSPHERE
1.0000 mL | INTRAVENOUS | Status: AC | PRN
  Administered 2019-11-23: 2 mL via INTRAVENOUS

## 2019-12-07 DIAGNOSIS — R413 Other amnesia: Secondary | ICD-10-CM | POA: Diagnosis not present

## 2019-12-07 DIAGNOSIS — R410 Disorientation, unspecified: Secondary | ICD-10-CM | POA: Diagnosis not present

## 2019-12-12 ENCOUNTER — Telehealth: Payer: Self-pay | Admitting: Cardiovascular Disease

## 2019-12-12 NOTE — Telephone Encounter (Signed)
Spoke w/ pt's son, [name].  Advised him that it is ok to give pt's warfarin at lunchtime, as I typically see pt in the am, and if his INR is high, we can hold it if he has not taken it yet. [name] verbalizes understanding and will accompany pt to his appt on 12/14/19.

## 2019-12-12 NOTE — Telephone Encounter (Signed)
Patient son assisting with meds and gives warfarin at noon and not at 6 .  Is this ok?

## 2019-12-14 ENCOUNTER — Ambulatory Visit (INDEPENDENT_AMBULATORY_CARE_PROVIDER_SITE_OTHER): Payer: Medicare Other

## 2019-12-14 ENCOUNTER — Other Ambulatory Visit: Payer: Self-pay

## 2019-12-14 DIAGNOSIS — I4819 Other persistent atrial fibrillation: Secondary | ICD-10-CM | POA: Diagnosis not present

## 2019-12-14 DIAGNOSIS — Z5181 Encounter for therapeutic drug level monitoring: Secondary | ICD-10-CM

## 2019-12-14 LAB — POCT INR: INR: 3.3 — AB (ref 2.0–3.0)

## 2019-12-14 NOTE — Patient Instructions (Signed)
-   skip warfarin today - then continue dosage of 1 tablet every day EXCEPT 1/2 tablet on [place].  Please be consistent w/ your greens intake - pick a day each week to have your greens and have them every week on that same day.  Recheck in 4 weeks.

## 2019-12-20 ENCOUNTER — Other Ambulatory Visit: Payer: Self-pay | Admitting: *Deleted

## 2020-01-03 NOTE — Patient Outreach (Signed)
[facility]) Care Management  [place]  [id number]  Jeffery Reynolds [birth year] [id number]  RN Health Coach telephone call to patient.  [employer] compliance verified. Per patient he has decreased his exercises to three times a week.  Patient stated that he feels sore when he does four times a week. Patient stated he is trying to adhere to the low sodium diet. Per patient his son cooks alls the meals. Per patient he is taking all his medication except his Aricept. Patient stated that his son has told him not to take it. He stated he thought his confusion was due to a combination of medications and now it is better. . RN discussed the use of Aricept and explained to the patient the he needs to discuss this with his Dr. Patient has been following up with coumadin clinic. Patient has agreed to follow up outreach calls.   Encounter Medications:  Outpatient Encounter Medications as of 12/20/2019  Medication Sig  . allopurinol (ZYLOPRIM) 300 MG tablet TAKE 1 TABLET ON MONDAY, WEDNESDAY AND FRIDAY  . Artificial Tear Ointment (DRY EYES OP) Apply 1 drop 3 (three) times daily to eye.  Marland Kitchen atorvastatin (LIPITOR) 40 MG tablet TAKE 1 TABLET BY MOUTH EVERY DAY  . furosemide (LASIX) 20 MG tablet TAKE 1 TABLET BY MOUTH EVERY OTHER DAY FOR FLUID RETENTION  . metoprolol tartrate (LOPRESSOR) 50 MG tablet TAKE 1 TABLET(50 MG) BY MOUTH TWICE DAILY  . Pedialyte (PEDIALYTE) SOLN Take 240 mLs by mouth.  . warfarin (COUMADIN) 5 MG tablet Take 1 tablet (5 mg total) by mouth daily at 6 PM. TAKE 1 TABLET BY MOUTH all days except for Monday and Friday. Monday and Friday pt takes 2.5 mg.   No facility-administered encounter medications on file as of 12/20/2019.    Functional Status:  In your present state of health, do you have any difficulty performing the following activities: 09/27/2019 09/21/2019  Hearing? N N  Vision? N N  Difficulty concentrating or making decisions? Y Y  Comment Dx of dementia -   Walking or climbing stairs? N N  Comment Cane in use as needed. -  Dressing or bathing? Y Y  Comment Son assist with dressing as needed. tying shoes. His sone [name]  Doing errands, shopping? Y N  Comment He does not currently drive. -  Preparing Food and eating ? [name]  Comment Son prepares meals. Self feeds. [name]  Using the Toilet? N N  In the past six months, have you accidently leaked urine? N N  Do you have problems with loss of bowel control? N N  Managing your Medications? Y Y  Comment Son assist [name]  Managing your Finances? Y Y  Comment Son assist [name]  Housekeeping or managing your Housekeeping? Y Y  Comment Son assist ed assists  Some recent data might be hidden    Fall/Depression Screening: Fall Risk  10/10/2019 09/27/2019 09/21/2019  Falls in the past year? 0 1 1  Comment - No falls since last reported in the last 5 days ago. -  Number falls in past yr: 0 - 1  Injury with Fall? 0 - 1  Comment - - stitches in head  Risk for fall due to : Impaired balance/gait - History of fall(s);Impaired balance/gait;Impaired mobility  Follow up Falls evaluation completed Falls evaluation completed Falls evaluation completed;Education provided;Falls prevention discussed  Comment - - -   PHQ 2/9 Scores 10/10/2019 09/21/2019 09/24/2018 09/23/2017 09/22/2016  01/30/2016 09/11/2015  PHQ - 2 Score 0 0 0 0 0 0 0   THN CM Care Plan Problem One     Most Recent Value  Care Plan Problem One  Knowledge deficit of self management for atrial fibrillation  Role Documenting the Problem One  [place] for Problem One  Active  THN Long Term Goal   Patient will be able to verbalize symptoms of uncontrolledAFIB within the next 90 days  Interventions for Problem One [place] reinterates that some people may feel a fast irregular heatbeat. Some may have sdizziness, sweating or chec=st pain or pressure. Some may feel nothing. Rn reiterates ss/s  stroke and heart attack.  THN CM Short Term Goal #1   Patient will be able to verbalize health maintenace needs within the next 30 days  Interventions for Short Term Goal #1  R reiterares health care maintenance. RN reiterates taking vaccines, eye care and keeping follow up appointments.  RN will follow up with further discussion       Assessment:  Patient has stopped taking the aricept Patient is following up with Coumadin clinic Patient son cooks all the meals Patient is trying to adhere to low sodium diet Patient exercises three times a week  Plan:  RN discussed Aricept and patient will follow up Physician RN discussed Health Maintenance RN reiterated low sodium diet RN will follow up within the month of July  Yacoub Diltz [place] Management [phone]

## 2020-01-04 ENCOUNTER — Other Ambulatory Visit: Payer: Self-pay | Admitting: Internal Medicine

## 2020-01-11 ENCOUNTER — Ambulatory Visit (INDEPENDENT_AMBULATORY_CARE_PROVIDER_SITE_OTHER): Payer: Medicare Other

## 2020-01-11 ENCOUNTER — Other Ambulatory Visit: Payer: Self-pay

## 2020-01-11 DIAGNOSIS — I4819 Other persistent atrial fibrillation: Secondary | ICD-10-CM

## 2020-01-11 DIAGNOSIS — Z5181 Encounter for therapeutic drug level monitoring: Secondary | ICD-10-CM

## 2020-01-11 LAB — POCT INR: INR: 3 (ref 2.0–3.0)

## 2020-01-11 NOTE — Patient Instructions (Signed)
-   have a serving of greens today - then continue warfarin dosage of 1 tablet every day EXCEPT 1/2 tablet on [place].  Please be consistent w/ your greens intake - pick a day each week to have your greens and have them every week on that same day.  Recheck in 5 weeks.

## 2020-01-16 ENCOUNTER — Other Ambulatory Visit: Payer: Self-pay | Admitting: Internal Medicine

## 2020-01-20 ENCOUNTER — Telehealth: Payer: Self-pay | Admitting: Internal Medicine

## 2020-01-20 DIAGNOSIS — Z1211 Encounter for screening for malignant neoplasm of colon: Secondary | ICD-10-CM

## 2020-01-20 NOTE — Telephone Encounter (Signed)
Cologuard approved an ordered.

## 2020-01-25 ENCOUNTER — Encounter: Payer: Self-pay | Admitting: Internal Medicine

## 2020-01-25 ENCOUNTER — Telehealth (INDEPENDENT_AMBULATORY_CARE_PROVIDER_SITE_OTHER): Payer: Medicare Other | Admitting: Internal Medicine

## 2020-01-25 DIAGNOSIS — M7989 Other specified soft tissue disorders: Secondary | ICD-10-CM | POA: Insufficient documentation

## 2020-01-25 NOTE — Assessment & Plan Note (Signed)
Unlikely to be DVT given therapeutic INR ,  Daily exercise and history of chronic LE swelling since  His venous stripping 30 years ago.  Compression stocking ,  Leg elevation.  Can increase furosemide to qd x 3 days,  Then resume qod dosing

## 2020-01-25 NOTE — Progress Notes (Signed)
Telephone  Note  This visit type was conducted due to national recommendations for restrictions regarding the COVID-19 pandemic (e.g. social distancing).  This format is felt to be most appropriate for this patient at this time.  All issues noted in this document were discussed and addressed.  No physical exam was performed (except for noted visual exam findings with Video Visits).   I connected with@ on 01/25/20 at  3:00 PM EDT by telephone and verified that I am speaking with the correct person using two identifiers. Location patient: home Location provider: work or home office Persons participating in the virtual visit: patient, provider  I discussed the limitations, risks, security and privacy concerns of performing an evaluation and management service by telephone and the availability of in person appointments. I also discussed with the patient that there may be a patient responsible charge related to this service. The patient expressed understanding and agreed to proceed.  Reason for visit: RLE swelling for ten days  HPI:  86 yr old male with chronic atrial fibrillation on warfarin presents with 10 day history of right LE swelling from the knee to the calf,  Ankle also slightly swollen.   The leg is not painful, red or warm.  Started ten days ago.  History of right lower extremity venous stripping 30 years ago for his CABG.  No history of hip or knee  surgery.  Exercises 3-4 times per week   And did a lot of calf raises. Takes warfarin  INR 3.0 on May 19.    Chronic asymmetry  Of right leg since his vein harvesting.    ROS: See pertinent positives and negatives per HPI.  Past Medical History:  Diagnosis Date  . Arthritis   . CAD (coronary artery disease)    a. s/p 6-V CABG in [place] in 1995; b. MV 4/12: mild intensity, fixed perfusion defect involving the basal to mid anterior wall consistent with injury to the diagonal distribution (this finding was also noted on prior study dating  back to 2008). Overall, no ischemia was noted and there was no high-risk prognostic indicators based on this study. EF 63%  . Carotid artery disease ([place])    a. ultrasound 12/16: 1-39% bilateral ICA stenosis  . Chronic atrial fibrillation ([place]) 2000   a. CHADS2VASc => 4 (HTN, age x 2, vascular disease); b. [facility]  . Chronic back pain   . Cognitive impairment   . Colon polyps   . Diastolic dysfunction    a. TTE 4/10: EF of [id number], mild diastolic dysfunction, no significant valvular abnormalities, mild ascending aortic root dilatation, moderate left atrial enlargement, technically difficult study  . Genital warts   . GERD (gastroesophageal reflux disease)   . Gout    Controlled with Allopurinol  . History of blood transfusion   . History of chicken pox   . Hyperlipidemia   . Hypertension   . Seasonal allergies   . Sleep apnea, obstructive   . 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 CARDIAC CATHETERIZATION    . CORONARY ARTERY BYPASS GRAFT  1995   CABG x 6 in [place], [place]    . GYNECOMASTIA EXCISION    . KNEE SURGERY     bilateral   . ROTATOR CUFF REPAIR     right  . TONSILLECTOMY AND ADENOIDECTOMY  1943    Family History  Problem Relation Age of Onset  .  Heart disease Father   . Heart attack Father 80       MI  . Alcohol abuse Father   . Hyperlipidemia Father   . Hypertension Father   . Heart attack Paternal Grandfather 24  . Alcohol abuse Paternal Grandfather   . Cancer Mother        Breast cancer and Lung Cancer  . Diabetes Brother   . Dementia Brother   . Crohn's disease Daughter   . Cancer Brother        lung cancer  . Alcohol abuse Son   . Alcohol abuse Maternal Grandfather   . Alcohol abuse Son   . Heart attack Paternal Grandmother 51     SOCIAL HX:  reports that he has never smoked. He has never used smokeless tobacco. He reports previous alcohol use. He reports  that he does not use drugs. lives with son and son manageds his medications  Current Outpatient Medications:  .  allopurinol (ZYLOPRIM) 300 MG tablet, TAKE 1 TABLET ON MONDAY, WEDNESDAY AND FRIDAY, Disp: 12 tablet, Rfl: 2 .  Artificial Tear Ointment (DRY EYES OP), Apply 1 drop 3 (three) times daily to eye., Disp: , Rfl:  .  atorvastatin (LIPITOR) 40 MG tablet, TAKE 1 TABLET BY MOUTH EVERY DAY, Disp: 90 tablet, Rfl: 1 .  furosemide (LASIX) 20 MG tablet, TAKE 1 TABLET BY MOUTH EVERY OTHER DAY FOR FLUID RETENTION, Disp: 45 tablet, Rfl: 2 .  metoprolol tartrate (LOPRESSOR) 50 MG tablet, TAKE 1 TABLET(50 MG) BY MOUTH TWICE DAILY, Disp: 180 tablet, Rfl: 1 .  Pedialyte (PEDIALYTE) SOLN, Take 240 mLs by mouth., Disp: , Rfl:  .  warfarin (COUMADIN) 5 MG tablet, Take 1 tablet (5 mg total) by mouth daily at 6 PM. TAKE 1 TABLET BY MOUTH all days except for Monday and Friday. Monday and Friday pt takes 2.5 mg., Disp: 90 tablet, Rfl: 1  EXAM:   General impression: alert, cooperative and articulate.  No signs of being in distress  Lungs: speech is fluent sentence length suggests that patient is not short of breath and not punctuated by cough, sneezing or sniffing. Marland Kitchen   Psych: affect normal.  speech is articulate and non pressured .  Denies suicidal thoughts   ASSESSMENT AND PLAN:  Discussed the following assessment and plan:  Right leg swelling  Right leg swelling Unlikely to be DVT given therapeutic INR ,  Daily exercise and history of chronic LE swelling since  His venous stripping 30 years ago.  Compression stocking ,  Leg elevation.  Can increase furosemide to qd x 3 days,  Then resume qod dosing     I discussed the assessment and treatment plan with the patient. The patient was provided an opportunity to ask questions and all were answered. The patient agreed with the plan and demonstrated an understanding of the instructions.   The patient was advised to call back or seek an in-person evaluation  if the symptoms worsen or if the condition fails to improve as anticipated.  I provided  30 minutes of non face-to-face time during this encounter reviewing patient's current problems and past surgeries, labs and imaging studies, providing counseling on the above mentioned problems , and coordination  of care .  Crecencio Mc, MD

## 2020-01-25 NOTE — Progress Notes (Signed)
Right swollen calf. No pain, no redness, no SOBr x10 days. Pt stated that he has been doing a lot of exercising but has stopped because son thought something could be wrong with pt's leg.

## 2020-02-20 ENCOUNTER — Ambulatory Visit (INDEPENDENT_AMBULATORY_CARE_PROVIDER_SITE_OTHER): Payer: Medicare Other

## 2020-02-20 ENCOUNTER — Other Ambulatory Visit: Payer: Self-pay

## 2020-02-20 DIAGNOSIS — I4819 Other persistent atrial fibrillation: Secondary | ICD-10-CM | POA: Diagnosis not present

## 2020-02-20 DIAGNOSIS — Z5181 Encounter for therapeutic drug level monitoring: Secondary | ICD-10-CM | POA: Diagnosis not present

## 2020-02-20 LAB — POCT INR: INR: 3 (ref 2.0–3.0)

## 2020-02-20 NOTE — Patient Instructions (Signed)
-   have a serving of greens today - then continue warfarin dosage of 1 tablet every day EXCEPT 1/2 tablet on [place].  Please be consistent w/ your greens intake - pick a day each week to have your greens and have them every week on that same day.  Recheck in 6 weeks.

## 2020-03-21 ENCOUNTER — Other Ambulatory Visit: Payer: Self-pay | Admitting: *Deleted

## 2020-03-21 NOTE — Patient Outreach (Signed)
[facility]) Care Management  [place]  03/21/2020   Jeffery Reynolds 11/21/1933 [id number]  RN Health Coach telephone call to patient.  [employer] compliance verified. Per patient he is taking his medications as prescribed except for the Aricept. Patient son is preparing his meals. Per son he is not adding anything sodium additional to Reynolds. Son discussed a meal that he is preparing. Patient eats cereal for breakfast, or sometimes an egg and a slice of bacon. Patient appetite is good. Patient is going to gym three times a week to exercise. Per patient hie has some discomfort in his right leg. It is not swollen right now but he is wearing compression hose. He is elevating his legs when sitting. Patient weight is 202 pounds. Patient has a CPAP and wanted to discuss cleaning machines. RN health coach talked with patient and son about CPAP cleaners and the importance of keeping it clean whether by soap and water or purchasing a cleaner. Patient and son have agreed to further outreach calls.   Encounter Medications:  Outpatient Encounter Medications as of 03/21/2020  Medication Sig  . allopurinol (ZYLOPRIM) 300 MG tablet TAKE 1 TABLET ON MONDAY, WEDNESDAY AND FRIDAY  . Artificial Tear Ointment (DRY EYES OP) Apply 1 drop 3 (three) times daily to eye.  Marland Kitchen atorvastatin (LIPITOR) 40 MG tablet TAKE 1 TABLET BY MOUTH EVERY DAY  . furosemide (LASIX) 20 MG tablet TAKE 1 TABLET BY MOUTH EVERY OTHER DAY FOR FLUID RETENTION  . metoprolol tartrate (LOPRESSOR) 50 MG tablet TAKE 1 TABLET(50 MG) BY MOUTH TWICE DAILY  . Pedialyte (PEDIALYTE) SOLN Take 240 mLs by mouth.  . warfarin (COUMADIN) 5 MG tablet Take 1 tablet (5 mg total) by mouth daily at 6 PM. TAKE 1 TABLET BY MOUTH all days except for Monday and Friday. Monday and Friday pt takes 2.5 mg.   No facility-administered encounter medications on file as of 03/21/2020.    Functional Status:  In your present state of health, do you have any  difficulty performing the following activities: 09/27/2019 09/21/2019  Hearing? N N  Vision? N N  Difficulty concentrating or making decisions? Y Y  Comment Dx of dementia -  Walking or climbing stairs? N N  Comment Cane in use as needed. -  Dressing or bathing? Y Y  Comment Son assist with dressing as needed. tying shoes. His sone Jeffery Reynolds  Doing errands, shopping? Y N  Comment He does not currently drive. -  Preparing Reynolds and eating ? Jeffery Reynolds  Comment Son Reynolds meals. Self feeds. Jeffery Reynolds  Using the Toilet? N N  In the past six months, have you accidently leaked urine? N N  Do you have problems with loss of bowel control? N N  Managing your Medications? Y Y  Comment Son assist Jeffery Reynolds  Managing your Finances? Y Y  Comment Son assist Jeffery Reynolds  Housekeeping or managing your Housekeeping? Y Y  Comment Son assist Jeffery Reynolds  Some recent data might be hidden    Fall/Depression Screening: Fall Risk  03/21/2020 01/25/2020 10/10/2019  Falls in the past year? 1 1 0  Comment - - -  Number falls in past yr: 1 1 0  Injury with Fall? 1 0 0  Comment - - -  Risk for fall due to : History of fall(s);Impaired balance/gait;Impaired mobility - Impaired balance/gait  Follow up Falls evaluation completed Falls evaluation completed Falls evaluation completed  Comment - - -  PHQ 2/9 Scores 10/10/2019 09/21/2019 09/24/2018 09/23/2017 09/22/2016 01/30/2016 09/11/2015  PHQ - 2 Score 0 0 0 0 0 0 0   Goals Addressed            This Visit's Progress   . Client will report no worsening of symptoms of Atrial Fibrillation within the next 90 months       CARE PLAN ENTRY (see longtitudinal plan of care for additional care plan information)   Current Barriers:  Knowledge Deficit related to self management of Atrial Fibrillation  Case Manager Clinical Goal(s):  Verbalize understanding of the signs and symptoms of atrial Fibrillation within the next 90 days Will not experience hospital  admissions within the next 90 days Attend all scheduled medical appointments Monitor for bleeding complication within the next 90 days  Interventions:  . Provided verbal education on low sodium diet . Provided written education on low sodium diet . Provided education on CPAP cleaning . Provided education on wearing compression hose  Patient Self Care Activities:  . Adhere to medication regimen as prescribed . Verbalize action plan when experiencing Atrial Fibrillation symptoms . Will attend all scheduled appointments . Monitor PT an INR was per ordered . Adhere to low sodium diet   Follow up outreach call within the month of October        Assessment:  Patient is taking medications as per ordered except Aricept Patient is exercising at gym three times a week Patient is wearing compression hose every other day  Plan:  Patient will wear compression hose daily Patient will continue to have INR monitored Patient will adhere to appointments Patient will adhere to diet Patient and son will follow up on CPAP cleaners RN will follow up outreach within the month of October  Jeffery Reynolds Management [phone]

## 2020-03-22 ENCOUNTER — Ambulatory Visit: Payer: Medicare Other | Admitting: Cardiovascular Disease

## 2020-03-22 NOTE — Progress Notes (Deleted)
Cardiology Office Note   Date:  03/22/2020   ID:  Jeffery Reynolds, DOB [birth year], MRN [id number]  PCP:  Crecencio Mc, MD  Cardiologist:   Kathlyn Sacramento, MD   No chief complaint on file.     History of Present Illness: Jeffery Reynolds is a 86 y.o. male who presents for a follow-up visit regarding coronary artery disease and chronic atrial fibrillation. He is status post 6 vessel CABG in 1995 in [place]. He is being treated with rate control and anticoagulation for atrial fibrillation. He has been doing well with no recent chest pain, shortness of breath or palpitations.  He takes his medications regularly.      Past Medical History:  Diagnosis Date  . Arthritis   . CAD (coronary artery disease)    a. s/p 6-V CABG in [place] in 1995; b. MV 4/12: mild intensity, fixed perfusion defect involving the basal to mid anterior wall consistent with injury to the diagonal distribution (this finding was also noted on prior study dating back to 2008). Overall, no ischemia was noted and there was no high-risk prognostic indicators based on this study. EF 63%  . Carotid artery disease ([place])    a. ultrasound 12/16: 1-39% bilateral ICA stenosis  . Chronic atrial fibrillation ([place]) 2000   a. CHADS2VASc => 4 (HTN, age x 2, vascular disease); b. on [facility]mad[facility]  . Chronic back pain   . Cognitive impairment   . Colon polyps   . Diastolic dysfunction    a. TTE 4/10: EF of 62%, mild diastolic dysfunction, no significant valvular abnormalities, mild ascending aortic root dilatation, moderate left atrial enlargement, technically difficult study  . Genital warts   . GERD (gastroesophageal reflux disease)   . Gout    Controlled with Allopurinol  . History of blood transfusion   . History of chicken pox   . Hyperlipidemia   . Hypertension   . Seasonal allergies   . Sleep apnea, obstructive   . 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 CARDIAC CATHETERIZATION    . CORONARY ARTERY BYPASS GRAFT  1995   CABG x 6 in [place], [place]    . GYNECOMASTIA EXCISION    . KNEE SURGERY     bilateral   . ROTATOR CUFF REPAIR     right  . TONSILLECTOMY AND ADENOIDECTOMY  1943     Current Outpatient Medications  Medication Sig Dispense Refill  . allopurinol (ZYLOPRIM) 300 MG tablet TAKE 1 TABLET ON MONDAY, WEDNESDAY AND FRIDAY 12 tablet 2  . Artificial Tear Ointment (DRY EYES OP) Apply 1 drop 3 (three) times daily to eye.    Marland Kitchen atorvastatin (LIPITOR) 40 MG tablet TAKE 1 TABLET BY MOUTH EVERY DAY 90 tablet 1  . furosemide (LASIX) 20 MG tablet TAKE 1 TABLET BY MOUTH EVERY OTHER DAY FOR FLUID RETENTION 45 tablet 2  . metoprolol tartrate (LOPRESSOR) 50 MG tablet TAKE 1 TABLET(50 MG) BY MOUTH TWICE DAILY 180 tablet 1  . Pedialyte (PEDIALYTE) SOLN Take 240 mLs by mouth.    . warfarin (COUMADIN) 5 MG tablet Take 1 tablet (5 mg total) by mouth daily at 6 PM. TAKE 1 TABLET BY MOUTH all days except for Monday and Friday. Monday and Friday pt takes 2.5 mg. 90 tablet 1   No current facility-administered medications for this visit.    Allergies:   Patient has no known allergies.  Social History:  The patient  reports that he has never smoked. He has never used smokeless tobacco. He reports previous alcohol use. He reports that he does not use drugs.   Family History:  The patient's family history includes Alcohol abuse in his father, maternal grandfather, paternal grandfather, son, and son; Cancer in his brother and mother; Crohn's disease in his daughter; Dementia in his brother; Diabetes in his brother; Heart attack (age of onset: 44) in his father; Heart attack (age of onset: 39) in his paternal grandfather; Heart attack (age of onset: 74) in his paternal grandmother; Heart disease in his father; Hyperlipidemia in his father; Hypertension in his father.    ROS:  Please see the history of present  illness.   Otherwise, review of systems are positive for none.   All other systems are reviewed and negative.    PHYSICAL EXAM: VS:  There were no vitals taken for this visit. , BMI There is no height or weight on file to calculate BMI. GEN: Well nourished, well developed, in no acute distress  HEENT: normal  Neck: no JVD, carotid bruits, or masses Cardiac: Irregularly irregular; no murmurs, rubs, or gallops,no edema  Respiratory:  clear to auscultation bilaterally, normal work of breathing GI: soft, nontender, nondistended, + BS MS: no deformity or atrophy  Skin: warm and dry, no rash Neuro:  Strength and sensation are intact Psych: euthymic mood, full affect   EKG:  EKG is ordered today. The ekg ordered today demonstrates atrial fibrillation with ventricular rate of 99 bpm. No significant ST or T wave changes.   Recent Labs: 10/26/2019: ALT 25; BUN 15; Creatinine, Ser 1.32; Hemoglobin 14.1; Platelets 107; Potassium 3.6; Sodium 141    Lipid Panel    Component Value Date/Time   CHOL 126 04/07/2019 0903   CHOL 137 03/24/2014 1048   TRIG 206.0 (H) 04/07/2019 0903   HDL 35.70 (L) 04/07/2019 0903   HDL 42 03/24/2014 1048   CHOLHDL 4 04/07/2019 0903   VLDL 41.2 (H) 04/07/2019 0903   LDLCALC 26 01/06/2018 0922   LDLCALC 59 03/24/2014 1048   LDLDIRECT 65.0 04/07/2019 0903      Wt Readings from Last 3 Encounters:  01/25/20 210 lb (95.3 kg)  10/26/19 208 lb (94.3 kg)  10/12/19 203 lb 12.8 oz (92.4 kg)      No flowsheet data found.    ASSESSMENT AND PLAN:   1.  Chronic atrial fibrillation:  He is doing well overall with reasonably controlled ventricular rate. He appears to be asymptomatic. He is tolerating anticoagulation with warfarin. He does have thrombocytopenia that has been stable at 83,000.  2. Coronary artery disease involving native coronary arteries without angina: Continue medical therapy.  3. Essential hypertension: Blood pressure is controlled on current  medications.  4. Hyperlipidemia: Continue treatment with atorvastatin. Most recent LDL was 26.    Disposition:   FU with me in 1 year  Signed,  Kathlyn Sacramento, MD  03/22/2020 8:24 AM    [place]

## 2020-03-23 ENCOUNTER — Encounter: Payer: Self-pay | Admitting: Cardiovascular Disease

## 2020-03-23 DIAGNOSIS — E538 Deficiency of other specified B group vitamins: Secondary | ICD-10-CM | POA: Diagnosis not present

## 2020-03-23 DIAGNOSIS — E559 Vitamin D deficiency, unspecified: Secondary | ICD-10-CM | POA: Diagnosis not present

## 2020-03-23 DIAGNOSIS — R413 Other amnesia: Secondary | ICD-10-CM | POA: Diagnosis not present

## 2020-03-23 DIAGNOSIS — R410 Disorientation, unspecified: Secondary | ICD-10-CM | POA: Diagnosis not present
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
## 2020-03-27 ENCOUNTER — Other Ambulatory Visit: Payer: Self-pay

## 2020-03-27 ENCOUNTER — Encounter: Payer: Self-pay | Admitting: Physician Assistant

## 2020-03-27 ENCOUNTER — Ambulatory Visit (INDEPENDENT_AMBULATORY_CARE_PROVIDER_SITE_OTHER): Payer: Medicare Other | Admitting: Physician Assistant

## 2020-03-27 VITALS — BP 126/82 | HR 80 | Ht 68.0 in | Wt 205.0 lb

## 2020-03-27 DIAGNOSIS — I4819 Other persistent atrial fibrillation: Secondary | ICD-10-CM | POA: Diagnosis not present

## 2020-03-27 DIAGNOSIS — I10 Essential (primary) hypertension: Secondary | ICD-10-CM | POA: Diagnosis not present

## 2020-03-27 DIAGNOSIS — Z951 Presence of aortocoronary bypass graft: Secondary | ICD-10-CM | POA: Diagnosis not present

## 2020-03-27 DIAGNOSIS — Z79899 Other long term (current) drug therapy: Secondary | ICD-10-CM | POA: Diagnosis not present

## 2020-03-27 DIAGNOSIS — D696 Thrombocytopenia, unspecified: Secondary | ICD-10-CM

## 2020-03-27 DIAGNOSIS — I2510 Atherosclerotic heart disease of native coronary artery without angina pectoris: Secondary | ICD-10-CM | POA: Diagnosis not present

## 2020-03-27 DIAGNOSIS — I4821 Permanent atrial fibrillation: Secondary | ICD-10-CM | POA: Diagnosis not present

## 2020-03-27 DIAGNOSIS — E785 Hyperlipidemia, unspecified: Secondary | ICD-10-CM

## 2020-03-27 DIAGNOSIS — Z8673 Personal history of transient ischemic attack (TIA), and cerebral infarction without residual deficits: Secondary | ICD-10-CM | POA: Diagnosis not present

## 2020-03-27 NOTE — Patient Instructions (Signed)
Medication Instructions:  - Your physician recommends that you continue on your current medications as directed. Please refer to the Current Medication list given to you today.   *If you need a refill on your cardiac medications before your next appointment, please call your pharmacy*   Lab Work: - Your physician recommends that you have lab work today: CMET/ Lipid/ CBC  If you have labs (blood work) drawn today and your tests are completely normal, you will receive your results only by: Marland Kitchen MyChart Message (if you have MyChart) OR . A paper copy in the mail If you have any lab test that is abnormal or we need to change your treatment, we will call you to review the results.   Testing/Procedures: - none ordered   Follow-Up: At [facility], you and your health needs are our priority.  As part of our continuing mission to provide you with exceptional heart care, we have created designated Provider Care Teams.  These Care Teams include your primary Cardiologist (physician) and Advanced Practice Providers (APPs -  Physician Assistants and Nurse Practitioners) who all work together to provide you with the care you need, when you need it.  We recommend signing up for the patient portal called "MyChart".  Sign up information is provided on this After Visit Summary.  MyChart is used to connect with patients for Virtual Visits (Telemedicine).  Patients are able to view lab/test results, encounter notes, upcoming appointments, etc.  Non-urgent messages can be sent to your provider as well.   To learn more about what you can do with MyChart, go to [email or web address].    Your next appointment:   1 year(s)  The format for your next appointment:   In Person  Provider:    You may see Kathlyn Sacramento, MD or one of the following Advanced Practice Providers on your designated Care Team:    Murray Hodgkins, NP  Christell Faith, PA-C  Marrianne Mood, PA-C    Other Instructions - n/a

## 2020-03-28 LAB — COMPREHENSIVE METABOLIC PANEL
ALT: 28 IU/L (ref 0–44)
AST: 25 IU/L (ref 0–40)
Albumin/Globulin Ratio: 2 (ref 1.2–2.2)
Albumin: 4.4 g/dL (ref 3.6–4.6)
Alkaline Phosphatase: 106 IU/L (ref 48–121)
BUN/Creatinine Ratio: 13 (ref 10–24)
BUN: 16 mg/dL (ref 8–27)
Bilirubin Total: 1.1 mg/dL (ref 0.0–1.2)
CO2: 25 mmol/L (ref 20–29)
Calcium: 9.3 mg/dL (ref 8.6–10.2)
Chloride: 109 mmol/L — ABNORMAL HIGH (ref 96–106)
Creatinine, Ser: 1.2 mg/dL (ref 0.76–1.27)
GFR calc Af Amer: 64 mL/min/{1.73_m2} (ref >59)
GFR calc non Af Amer: 55 mL/min/{1.73_m2} — ABNORMAL LOW (ref >59)
Globulin, Total: 2.2 g/dL (ref 1.5–4.5)
Glucose: 142 mg/dL — ABNORMAL HIGH (ref 65–99)
Potassium: 4.5 mmol/L (ref 3.5–5.2)
Sodium: 147 mmol/L — ABNORMAL HIGH (ref 134–144)
Total Protein: 6.6 g/dL (ref 6.0–8.5)

## 2020-03-28 LAB — CBC WITH DIFFERENTIAL/PLATELET
Basophils Absolute: 0 10*3/uL (ref 0.0–0.2)
Basos: 1 %
EOS (ABSOLUTE): 0.1 10*3/uL (ref 0.0–0.4)
Eos: 2 %
Hematocrit: 44.3 % (ref 37.5–51.0)
Hemoglobin: 14.4 g/dL (ref 13.0–17.7)
Immature Grans (Abs): 0 10*3/uL (ref 0.0–0.1)
Immature Granulocytes: 0 %
Lymphocytes Absolute: 0.6 10*3/uL — ABNORMAL LOW (ref 0.7–3.1)
Lymphs: 24 %
MCH: 31.8 pg (ref 26.6–33.0)
MCHC: 32.5 g/dL (ref 31.5–35.7)
MCV: 98 fL — ABNORMAL HIGH (ref 79–97)
Monocytes Absolute: 0.2 10*3/uL (ref 0.1–0.9)
Monocytes: 8 %
Neutrophils Absolute: 1.7 10*3/uL (ref 1.4–7.0)
Neutrophils: 65 %
Platelets: 90 10*3/uL — CL (ref 150–450)
RBC: 4.53 x10E6/uL (ref 4.14–5.80)
RDW: 13.1 % (ref 11.6–15.4)
WBC: 2.6 10*3/uL — ABNORMAL LOW (ref 3.4–10.8)

## 2020-03-28 LAB — LIPID PANEL
Chol/HDL Ratio: 3.1 ratio (ref 0.0–5.0)
Cholesterol, Total: 128 mg/dL (ref 100–199)
HDL: 41 mg/dL (ref >39)
LDL Chol Calc (NIH): 60 mg/dL (ref 0–99)
Triglycerides: 160 mg/dL — ABNORMAL HIGH (ref 0–149)
VLDL Cholesterol Cal: 27 mg/dL (ref 5–40)

## 2020-03-29 ENCOUNTER — Other Ambulatory Visit: Payer: Self-pay | Admitting: Internal Medicine

## 2020-04-02 ENCOUNTER — Other Ambulatory Visit: Payer: Self-pay | Admitting: Internal Medicine

## 2020-04-02 ENCOUNTER — Ambulatory Visit (INDEPENDENT_AMBULATORY_CARE_PROVIDER_SITE_OTHER): Payer: Medicare Other

## 2020-04-02 ENCOUNTER — Other Ambulatory Visit: Payer: Self-pay

## 2020-04-02 DIAGNOSIS — Z5181 Encounter for therapeutic drug level monitoring: Secondary | ICD-10-CM

## 2020-04-02 DIAGNOSIS — I4819 Other persistent atrial fibrillation: Secondary | ICD-10-CM | POA: Diagnosis not present

## 2020-04-02 LAB — POCT INR: INR: 3.8 — AB (ref 2.0–3.0)

## 2020-04-02 NOTE — Patient Instructions (Signed)
-   skip warfarin today and tomorrow,  - then Wednesday continue warfarin dosage of 1 tablet every day EXCEPT 1/2 tablet on [place].  Please be consistent w/ your greens intake - pick a day each week to have your greens and have them every week on that same day.  -Recheck in 4 weeks.

## 2020-04-19 ENCOUNTER — Other Ambulatory Visit: Payer: Self-pay | Admitting: Internal Medicine

## 2020-05-02 ENCOUNTER — Ambulatory Visit (INDEPENDENT_AMBULATORY_CARE_PROVIDER_SITE_OTHER): Payer: Medicare Other

## 2020-05-02 ENCOUNTER — Other Ambulatory Visit: Payer: Self-pay

## 2020-05-02 DIAGNOSIS — Z5181 Encounter for therapeutic drug level monitoring: Secondary | ICD-10-CM | POA: Diagnosis not present

## 2020-05-02 DIAGNOSIS — I4819 Other persistent atrial fibrillation: Secondary | ICD-10-CM | POA: Diagnosis not present

## 2020-05-02 LAB — POCT INR: INR: 1.9 — AB (ref 2.0–3.0)

## 2020-05-02 NOTE — Patient Instructions (Signed)
-   take 1.5 tablets warfarin tonight,  - then continue warfarin dosage of 1 tablet every day EXCEPT 1/2 tablet on [place].   Please be consistent w/ your greens intake - pick a day each week to have your greens and have them every week on that same day.   -recheck in 4 weeks.

## 2020-05-18 ENCOUNTER — Telehealth: Payer: Self-pay | Admitting: Internal Medicine

## 2020-05-18 ENCOUNTER — Ambulatory Visit
Admission: EM | Admit: 2020-05-18 | Discharge: 2020-05-18 | Disposition: A | Discharge status: Home / Self Care | Payer: Medicare Other | Attending: Emergency Medicine | Admitting: Emergency Medicine

## 2020-05-18 DIAGNOSIS — H6123 Impacted cerumen, bilateral: Secondary | ICD-10-CM

## 2020-05-18 NOTE — Discharge Instructions (Addendum)
Schedule an appointment with an ENT as soon as possible.

## 2020-05-18 NOTE — ED Provider Notes (Signed)
Roderic Palau    CSN: [id number] Arrival date & time: 05/18/20  1025      History   Chief Complaint Chief Complaint  Patient presents with  . Ear Fullness    HPI AIRAM HEIDECKER is a 86 y.o. male.   Accompanied by his son, patient presents with bilateral ear fullness and decreased hearing.  His son reports he usually has his earwax removed yearly but has not been able to do this recently due to the pandemic.  He states the patient has only old his friends at the assisted living to try various home remedies without relief.  He denies fever, has pain, cough, shortness of breath, abdominal pain, or other symptoms.  The history is provided by the patient and a relative.    Past Medical History:  Diagnosis Date  . Arthritis   . CAD (coronary artery disease)    a. s/p 6-V CABG in [place] in 1995; b. MV 4/12: mild intensity, fixed perfusion defect involving the basal to mid anterior wall consistent with injury to the diagonal distribution (this finding was also noted on prior study dating back to 2008). Overall, no ischemia was noted and there was no high-risk prognostic indicators based on this study. EF 63%  . Carotid artery disease ([place])    a. ultrasound 12/16: 1-39% bilateral ICA stenosis  . Chronic atrial fibrillation ([place]) 2000   a. CHADS2VASc => 4 (HTN, age x 2, vascular disease); b. [facility]  . Chronic back pain   . Cognitive impairment   . Colon polyps   . Diastolic dysfunction    a. TTE 4/10: EF of 70%, mild diastolic dysfunction, no significant valvular abnormalities, mild ascending aortic root dilatation, moderate left atrial enlargement, technically difficult study  . Genital warts   . GERD (gastroesophageal reflux disease)   . Gout    Controlled with Allopurinol  . History of blood transfusion   . History of chicken pox   . Hyperlipidemia   . Hypertension   . Seasonal allergies   . Sleep apnea, obstructive   .  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 Right leg swelling 01/25/2020  . Diarrhea 10/26/2019  . Atherosclerosis of native artery of lower extremity with intermittent claudication, unspecified laterality ([place]) 10/11/2019  . Hemoptysis 09/15/2019  . Dementia without behavioral disturbance ([place]) 09/15/2019  . Hallucinations 09/07/2019  . Diuretic-induced hypokalemia 04/11/2019  . Anticoagulated on Coumadin 01/13/2019  . Insomnia due to anxiety and fear 04/28/2018  . Worsening vision 04/28/2018  . Impaired fasting glucose 10/19/2016  . Epistaxis 10/18/2016  . Urinary hesitancy 10/18/2016  . Osteoarthritis of carpometacarpal [facility]) joint of right thumb 08/02/2016  . Preoperative general physical examination 01/31/2016  . Venous stasis of both lower extremities 01/31/2016  . History of cerebrovascular disease 07/31/2015  . Encounter for preventive health examination 07/31/2015  . Dysplastic nevus of neck 06/13/2015  . Obesity 06/13/2015  . Mild cognitive impairment 04/13/2015  . Chronic atrial fibrillation ([place]) 03/16/2015  . Left shoulder pain 11/03/2014  . OSA (obstructive sleep apnea) 09/14/2014  . Thrombocytopenia ([place]) 08/17/2014  . Constipation 08/15/2014  . Medicare annual wellness visit, subsequent 08/15/2014  . Tubular adenoma of colon 08/14/2014  . Chronic pain of left knee 06/17/2014  . Chronic dryness of both eyes 06/14/2014  . S/P left unicompartmental knee replacement 06/02/2014  . Seasonal allergies 12/18/2013  . Arthritis 12/18/2013  . H/O: gout 12/18/2013  .  Encounter for therapeutic drug monitoring 12/14/2013  . Coronary artery disease of native artery of native heart with stable angina pectoris ([place])   . Hyperlipidemia   . Essential hypertension     Past Surgical History:  Procedure Laterality Date  . CARDIAC CATHETERIZATION    . CORONARY ARTERY BYPASS GRAFT  1995   CABG x 6 in [place]     . GYNECOMASTIA EXCISION    . KNEE SURGERY     bilateral   . ROTATOR CUFF REPAIR     right  . TONSILLECTOMY AND ADENOIDECTOMY  1943       Home Medications    Prior to Admission medications   Medication Sig Start Date End Date Taking? Authorizing Provider  allopurinol (ZYLOPRIM) 300 MG tablet TAKE 1 TABLET ON MONDAY, [facility] AND FRIDAY 04/02/20   Crecencio Mc, MD  Artificial Tear Ointment (DRY EYES OP) Apply 1 drop 3 (three) times daily to eye.    [provider]  atorvastatin (LIPITOR) 40 MG tablet TAKE 1 TABLET BY MOUTH EVERY DAY 04/19/20   Crecencio Mc, MD  cyanocobalamin 1000 MCG tablet Take by mouth.    [provider]  furosemide (LASIX) 20 MG tablet TAKE 1 TABLET BY MOUTH EVERY OTHER DAY FOR FLUID RETENTION 01/16/20   Crecencio Mc, MD  metoprolol tartrate (LOPRESSOR) 50 MG tablet TAKE 1 TABLET(50 MG) BY MOUTH TWICE DAILY 03/29/20   Crecencio Mc, MD  Pedialyte (PEDIALYTE) SOLN Take 240 mLs by mouth.    [provider]  warfarin (COUMADIN) 5 MG tablet TAKE 1 TABLET BY MOUTH DAILY AT 6 PM. TAKE 1 TABLET BY MOUTH ALL DAYS EXCEPT FOR MONDAY AND FRIDAY. MONDAY AND FRIDAY PT TAKES 2.5 MG. 04/19/20   Crecencio Mc, MD    Family History Family History  Problem Relation Age of Onset  . Heart disease Father   . Heart attack Father 18       MI  . Alcohol abuse Father   . Hyperlipidemia Father   . Hypertension Father   . Heart attack Paternal Grandfather 21  . Alcohol abuse Paternal Grandfather   . Cancer Mother        Breast cancer and Lung Cancer  . Diabetes Brother   . Dementia Brother   . Crohn's disease Daughter   . Cancer Brother        lung cancer  . Alcohol abuse Son   . Alcohol abuse Maternal Grandfather   . Alcohol abuse Son   . Heart attack Paternal Grandmother 100    Social History Social History   Tobacco Use  . Smoking status: Never Smoker  . Smokeless tobacco: Never Used  Vaping Use  . Vaping Use: Never used    Substance Use Topics  . Alcohol use: Not Currently  . Drug use: No     Allergies   Patient has no known allergies.   Review of Systems Review of Systems  Constitutional: Negative for chills and fever.  HENT: Positive for hearing loss. Negative for ear pain and sore throat.   Eyes: Negative for pain and visual disturbance.  Respiratory: Negative for cough and shortness of breath.   Cardiovascular: Negative for chest pain and palpitations.  Gastrointestinal: Negative for abdominal pain, diarrhea and vomiting.  Genitourinary: Negative for dysuria and hematuria.  Musculoskeletal: Negative for arthralgias and back pain.  Skin: Negative for color change and rash.  Neurological: Negative for seizures and syncope.  All other  systems reviewed and are negative.    Physical Exam Triage Vital Signs ED Triage Vitals  Enc Vitals Group     BP 05/18/20 1104 (!) 143/85     Pulse Rate 05/18/20 1104 69     Resp 05/18/20 1104 16     Temp 05/18/20 1104 99 F (37.2 C)     Temp src --      SpO2 05/18/20 1104 92 %     Weight --      Height --      Head Circumference --      Peak Flow --      Pain Score 05/18/20 1103 0     Pain Loc --      Pain Edu? --      Excl. in [place]? --    No data found.  Updated Vital Signs BP (!) 143/85   Pulse 69   Temp 99 F (37.2 C)   Resp 16   SpO2 92%   Visual Acuity Right Eye Distance:   Left Eye Distance:   Bilateral Distance:    Right Eye Near:   Left Eye Near:    Bilateral Near:     Physical Exam Vitals and nursing note reviewed.  Constitutional:      General: He is not in acute distress.    Appearance: He is well-developed.  HENT:     Head: Normocephalic and atraumatic.     Right Ear: There is impacted cerumen.     Left Ear: There is impacted cerumen.  Eyes:     Conjunctiva/sclera: Conjunctivae normal.  Cardiovascular:     Rate and Rhythm: Normal rate and regular rhythm.     Heart sounds: No murmur heard.   Pulmonary:      Effort: Pulmonary effort is normal. No respiratory distress.     Breath sounds: Normal breath sounds.  Abdominal:     Palpations: Abdomen is soft.     Tenderness: There is no abdominal tenderness.  Musculoskeletal:     Cervical back: Neck supple.  Skin:    General: Skin is warm and dry.  Neurological:     Mental Status: He is alert.      UC Treatments / Results  Labs (all labs ordered are listed, but only abnormal results are displayed) Labs Reviewed - No data to display  EKG   Radiology No results found.  Procedures Procedures (including critical care time)  Medications Ordered in UC Medications - No data to display  Initial Impression / Assessment and Plan / UC Course  I have reviewed the triage vital signs and the nursing notes.  Pertinent labs & imaging results that were available during my care of the patient were reviewed by me and considered in my medical decision making (see chart for details).   Bilateral cerumen impaction.  Partial removal of cerumen with irrigation but still unable to visualize either TM.  Discussed options with the patient and his son.  Suggest that they follow-up with ENT as soon as possible.  They agree to plan of care.   Final Clinical Impressions(s) / UC Diagnoses   Final diagnoses:  Bilateral impacted cerumen     Discharge Instructions     Schedule an appointment with an ENT as soon as possible.        ED Prescriptions    None     PDMP not reviewed this encounter.   Sharion Balloon, NP 05/18/20 1205

## 2020-05-18 NOTE — Telephone Encounter (Signed)
Done

## 2020-05-18 NOTE — Telephone Encounter (Signed)
Pt's son Percell Miller called and states that pt was seen at Urgent care for impacted ears. They asked pt to call PCP and get a referral to see ENT because they could not get all the wax out. Please advise asap

## 2020-05-18 NOTE — ED Triage Notes (Signed)
C/o bilateral ear fullness x2 months. Reports it has continued to get worse. Denies pain.

## 2020-05-23 DIAGNOSIS — H6123 Impacted cerumen, bilateral: Secondary | ICD-10-CM | POA: Diagnosis not present

## 2020-05-23 DIAGNOSIS — H93293 Other abnormal auditory perceptions, bilateral: Secondary | ICD-10-CM | POA: Diagnosis not present

## 2020-05-30 ENCOUNTER — Ambulatory Visit (INDEPENDENT_AMBULATORY_CARE_PROVIDER_SITE_OTHER): Payer: Medicare Other

## 2020-05-30 ENCOUNTER — Other Ambulatory Visit: Payer: Self-pay

## 2020-05-30 DIAGNOSIS — Z5181 Encounter for therapeutic drug level monitoring: Secondary | ICD-10-CM

## 2020-05-30 DIAGNOSIS — I4819 Other persistent atrial fibrillation: Secondary | ICD-10-CM

## 2020-05-30 LAB — POCT INR: INR: 2.1 (ref 2.0–3.0)

## 2020-05-30 NOTE — Patient Instructions (Signed)
-   then continue warfarin dosage of 1 tablet every day EXCEPT 1/2 tablet on [place].  Please be consistent w/ your greens intake - pick a day each week to have your greens and have them every week on that same day.  Recheck in 4 weeks.

## 2020-06-21 ENCOUNTER — Other Ambulatory Visit: Payer: Self-pay | Admitting: *Deleted

## 2020-06-21 NOTE — Patient Outreach (Signed)
[facility]) Care Management  06/21/2020  Jeffery Reynolds August 02, 1934 [id number]   RN Health Coach attempted follow up outreach call to patient.  Patient was unavailable. HIPPA compliance voicemail message left with return callback number.  Plan: RN will call patient again within 30 days.  [facility] [phone]

## 2020-06-27 ENCOUNTER — Ambulatory Visit (INDEPENDENT_AMBULATORY_CARE_PROVIDER_SITE_OTHER): Payer: Medicare Other

## 2020-06-27 ENCOUNTER — Other Ambulatory Visit: Payer: Self-pay

## 2020-06-27 DIAGNOSIS — Z5181 Encounter for therapeutic drug level monitoring: Secondary | ICD-10-CM

## 2020-06-27 DIAGNOSIS — I4819 Other persistent atrial fibrillation: Secondary | ICD-10-CM | POA: Diagnosis not present

## 2020-06-27 LAB — POCT INR: INR: 2 (ref 2.0–3.0)

## 2020-06-27 NOTE — Patient Instructions (Signed)
-   continue warfarin dosage of 1 tablet every day EXCEPT 1/2 tablet on [place].  Please be consistent w/ your greens intake - pick a day each week to have your greens and have them every week on that same day.  Recheck in 4 weeks.

## 2020-07-04 ENCOUNTER — Other Ambulatory Visit: Payer: Self-pay

## 2020-07-04 ENCOUNTER — Emergency Department: Payer: Medicare Other

## 2020-07-04 ENCOUNTER — Encounter: Payer: Self-pay | Admitting: Emergency Medicine

## 2020-07-04 ENCOUNTER — Other Ambulatory Visit: Payer: Self-pay | Admitting: Internal Medicine

## 2020-07-04 ENCOUNTER — Emergency Department
Admission: EM | Admit: 2020-07-04 | Discharge: 2020-07-04 | Disposition: A | Discharge status: Home / Self Care | Payer: Medicare Other | Attending: Emergency Medicine | Admitting: Emergency Medicine

## 2020-07-04 DIAGNOSIS — S0083XA Contusion of other part of head, initial encounter: Secondary | ICD-10-CM | POA: Insufficient documentation

## 2020-07-04 DIAGNOSIS — F0390 Unspecified dementia without behavioral disturbance: Secondary | ICD-10-CM | POA: Insufficient documentation

## 2020-07-04 DIAGNOSIS — Y92480 Sidewalk as the place of occurrence of the external cause: Secondary | ICD-10-CM | POA: Insufficient documentation

## 2020-07-04 DIAGNOSIS — I482 Chronic atrial fibrillation, unspecified: Secondary | ICD-10-CM | POA: Insufficient documentation

## 2020-07-04 DIAGNOSIS — I110 Hypertensive heart disease with heart failure: Secondary | ICD-10-CM | POA: Insufficient documentation

## 2020-07-04 DIAGNOSIS — S0121XA Laceration without foreign body of nose, initial encounter: Secondary | ICD-10-CM | POA: Diagnosis not present

## 2020-07-04 DIAGNOSIS — I2510 Atherosclerotic heart disease of native coronary artery without angina pectoris: Secondary | ICD-10-CM | POA: Insufficient documentation

## 2020-07-04 DIAGNOSIS — I25810 Atherosclerosis of coronary artery bypass graft(s) without angina pectoris: Secondary | ICD-10-CM | POA: Diagnosis not present

## 2020-07-04 DIAGNOSIS — G319 Degenerative disease of nervous system, unspecified: Secondary | ICD-10-CM | POA: Diagnosis not present

## 2020-07-04 DIAGNOSIS — W101XXA Fall (on)(from) sidewalk curb, initial encounter: Secondary | ICD-10-CM | POA: Insufficient documentation

## 2020-07-04 DIAGNOSIS — S022XXA Fracture of nasal bones, initial encounter for closed fracture: Secondary | ICD-10-CM

## 2020-07-04 DIAGNOSIS — Z79899 Other long term (current) drug therapy: Secondary | ICD-10-CM | POA: Diagnosis not present

## 2020-07-04 DIAGNOSIS — S199XXA Unspecified injury of neck, initial encounter: Secondary | ICD-10-CM | POA: Diagnosis not present

## 2020-07-04 DIAGNOSIS — S0081XA Abrasion of other part of head, initial encounter: Secondary | ICD-10-CM | POA: Diagnosis not present

## 2020-07-04 DIAGNOSIS — I5030 Unspecified diastolic (congestive) heart failure: Secondary | ICD-10-CM | POA: Diagnosis not present

## 2020-07-04 DIAGNOSIS — S0992XA Unspecified injury of nose, initial encounter: Secondary | ICD-10-CM | POA: Diagnosis present

## 2020-07-04 DIAGNOSIS — R531 Weakness: Secondary | ICD-10-CM | POA: Diagnosis not present

## 2020-07-04 DIAGNOSIS — Z7901 Long term (current) use of anticoagulants: Secondary | ICD-10-CM | POA: Diagnosis not present

## 2020-07-04 DIAGNOSIS — S0990XA Unspecified injury of head, initial encounter: Secondary | ICD-10-CM | POA: Diagnosis not present

## 2020-07-04 DIAGNOSIS — W19XXXA Unspecified fall, initial encounter: Secondary | ICD-10-CM | POA: Diagnosis not present

## 2020-07-04 LAB — APTT: aPTT: 34 seconds (ref 24–36)

## 2020-07-04 LAB — PROTIME-INR
INR: 1.8 — ABNORMAL HIGH (ref 0.8–1.2)
Prothrombin Time: 20.1 seconds — ABNORMAL HIGH (ref 11.4–15.2)

## 2020-07-04 MED ORDER — TRAMADOL HCL 50 MG PO TABS: 50.0000 mg | ORAL_TABLET | Freq: Three times a day (TID) | ORAL | 0 refills | Status: AC | PRN

## 2020-07-04 MED ORDER — LIDOCAINE-EPINEPHRINE-TETRACAINE (LET) TOPICAL GEL
3.0000 mL | Freq: Once | TOPICAL | Status: AC
  Administered 2020-07-04: 3 mL via TOPICAL

## 2020-07-04 MED ORDER — TRAMADOL HCL 50 MG PO TABS
50.0000 mg | ORAL_TABLET | Freq: Once | ORAL | Status: AC
  Administered 2020-07-04: 50 mg via ORAL
  Filled 2020-07-04: qty 1

## 2020-07-04 NOTE — ED Provider Notes (Signed)
[facility] Emergency Department Provider Note ____________________________________________  Time seen: 1858  I have reviewed the triage vital signs and the nursing notes.  HISTORY  Chief Complaint  Fall  HPI Jeffery Reynolds is a 86 y.o. male Presents to the ED via EMS from [place], with the patient apparently tripped over a curb.  He describes catching his foot and landed on his face.  He presents with abrasion to the face and some deformity to the nose.  Patient denies any loss of consciousness, nausea, vomiting, or dizziness.  He does take Coumadin regularly for atrial fibrillation.  Patient denies any weakness, dizziness, or symptoms preceding the mechanical fall.  He denies any significant complaints of pain at this time.  He presents along with his adult son for evaluation of his injuries.   Past Medical History:  Diagnosis Date  . Arthritis   . CAD (coronary artery disease)    a. s/p 6-V CABG in [place] in 1995; b. MV 4/12: mild intensity, fixed perfusion defect involving the basal to mid anterior wall consistent with injury to the diagonal distribution (this finding was also noted on prior study dating back to 2008). Overall, no ischemia was noted and there was no high-risk prognostic indicators based on this study. EF 63%  . Carotid artery disease ([place])    a. ultrasound 12/16: 1-39% bilateral ICA stenosis  . Chronic atrial fibrillation ([place]) 2000   a. CHADS2VASc => 4 (HTN, age x 2, vascular disease); b. on [facility]mad[facility]  . Chronic back pain   . Cognitive impairment   . Colon polyps   . Diastolic dysfunction    a. TTE 4/10: EF of 16%, mild diastolic dysfunction, no significant valvular abnormalities, mild ascending aortic root dilatation, moderate left atrial enlargement, technically difficult study  . Genital warts   . GERD (gastroesophageal reflux disease)   . Gout    Controlled with Allopurinol  . History of blood transfusion    . History of chicken pox   . Hyperlipidemia   . Hypertension   . Seasonal allergies   . Sleep apnea, obstructive   . 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 Right leg swelling 01/25/2020  . Diarrhea 10/26/2019  . Atherosclerosis of native artery of lower extremity with intermittent claudication, unspecified laterality ([place]) 10/11/2019  . Hemoptysis 09/15/2019  . Dementia without behavioral disturbance ([place]) 09/15/2019  . Hallucinations 09/07/2019  . Diuretic-induced hypokalemia 04/11/2019  . Anticoagulated on Coumadin 01/13/2019  . Insomnia due to anxiety and fear 04/28/2018  . Worsening vision 04/28/2018  . Impaired fasting glucose 10/19/2016  . Epistaxis 10/18/2016  . Urinary hesitancy 10/18/2016  . Osteoarthritis of carpometacarpal [facility]) joint of right thumb 08/02/2016  . Preoperative general physical examination 01/31/2016  . Venous stasis of both lower extremities 01/31/2016  . History of cerebrovascular disease 07/31/2015  . Encounter for preventive health examination 07/31/2015  . Dysplastic nevus of neck 06/13/2015  . Obesity 06/13/2015  . Mild cognitive impairment 04/13/2015  . Chronic atrial fibrillation ([place]) 03/16/2015  . Left shoulder pain 11/03/2014  . OSA (obstructive sleep apnea) 09/14/2014  . Thrombocytopenia ([place]) 08/17/2014  . Constipation 08/15/2014  . Medicare annual wellness visit, subsequent 08/15/2014  . Tubular adenoma of colon 08/14/2014  . Chronic pain of left knee 06/17/2014  . Chronic dryness of both eyes 06/14/2014  . S/P left unicompartmental knee replacement 06/02/2014  . Seasonal allergies 12/18/2013  . Arthritis 12/18/2013  .  H/O: gout 12/18/2013  . Encounter for therapeutic drug monitoring 12/14/2013  . Coronary artery disease of native artery of native heart with stable angina pectoris ([place])   . Hyperlipidemia   . Essential hypertension     Past Surgical History:  Procedure Laterality  Date  . CARDIAC CATHETERIZATION    . CORONARY ARTERY BYPASS GRAFT  1995   CABG x 6 in [place], [place]    . GYNECOMASTIA EXCISION    . KNEE SURGERY     bilateral   . ROTATOR CUFF REPAIR     right  . TONSILLECTOMY AND ADENOIDECTOMY  1943    Prior to Admission medications   Medication Sig Start Date End Date Taking? Authorizing Provider  allopurinol (ZYLOPRIM) 300 MG tablet TAKE 1 TABLET ON MONDAY, [facility] AND FRIDAY 07/04/20   Crecencio Mc, MD  Artificial Tear Ointment (DRY EYES OP) Apply 1 drop 3 (three) times daily to eye.    [provider]  atorvastatin (LIPITOR) 40 MG tablet TAKE 1 TABLET BY MOUTH EVERY DAY 04/19/20   Crecencio Mc, MD  cyanocobalamin 1000 MCG tablet Take by mouth.    [provider]  furosemide (LASIX) 20 MG tablet TAKE 1 TABLET BY MOUTH EVERY OTHER DAY FOR FLUID RETENTION 01/16/20   Crecencio Mc, MD  metoprolol tartrate (LOPRESSOR) 50 MG tablet TAKE 1 TABLET(50 MG) BY MOUTH TWICE DAILY 03/29/20   Crecencio Mc, MD  Pedialyte (PEDIALYTE) SOLN Take 240 mLs by mouth.    [provider]  traMADol (ULTRAM) 50 MG tablet Take 1 tablet (50 mg total) by mouth 3 (three) times daily as needed for up to 3 days. 07/04/20 07/07/20  Dezirea Mccollister, Dannielle Karvonen, PA-C  warfarin (COUMADIN) 5 MG tablet TAKE 1 TABLET BY MOUTH DAILY AT 6 PM. TAKE 1 TABLET BY MOUTH ALL DAYS EXCEPT FOR MONDAY AND FRIDAY. MONDAY AND FRIDAY PT TAKES 2.5 MG. 04/19/20   Crecencio Mc, MD    Allergies Patient has no known allergies.  Family History  Problem Relation Age of Onset  . Heart disease Father   . Heart attack Father 24       MI  . Alcohol abuse Father   . Hyperlipidemia Father   . Hypertension Father   . Heart attack Paternal Grandfather 69  . Alcohol abuse Paternal Grandfather   . Cancer Mother        Breast cancer and Lung Cancer  . Diabetes Brother   . Dementia Brother   . Crohn's disease Daughter   . Cancer Brother        lung  cancer  . Alcohol abuse Son   . Alcohol abuse Maternal Grandfather   . Alcohol abuse Son   . Heart attack Paternal Grandmother 22    Social History Social History   Tobacco Use  . Smoking status: Never Smoker  . Smokeless tobacco: Never Used  Vaping Use  . Vaping Use: Never used  Substance Use Topics  . Alcohol use: Not Currently  . Drug use: No    Review of Systems  Constitutional: Negative for fever. Eyes: Negative for visual changes. ENT: Negative for sore throat. Cardiovascular: Negative for chest pain. Respiratory: Negative for shortness of breath. Gastrointestinal: Negative for abdominal pain, vomiting and diarrhea. Genitourinary: Negative for dysuria. Musculoskeletal: Negative for back pain. Skin: Negative for rash.  Facial abrasions as above. Neurological: Negative for headaches, focal weakness or numbness. ____________________________________________  PHYSICAL EXAM:  VITAL SIGNS: ED Triage  Vitals  Enc Vitals Group     BP 07/04/20 1704 122/76     Pulse Rate 07/04/20 1704 70     Resp 07/04/20 1704 16     Temp 07/04/20 1704 98 F (36.7 C)     Temp Source 07/04/20 1704 Oral     SpO2 07/04/20 1704 95 %     Weight 07/04/20 1659 205 lb 0.4 oz (93 kg)     Height 07/04/20 1659 5\' 8"  (1.727 m)     Head Circumference --      Peak Flow --      Pain Score 07/04/20 1659 0     Pain Loc --      Pain Edu? --      Excl. in [place]? --     Constitutional: Alert and oriented. Well appearing and in no distress. GCS = 15 Head: Normocephalic and atraumatic, except for an abrasion to the central forehead.. Eyes: Conjunctivae are normal. PERRL. Normal extraocular movements Ears: Canals clear. TMs intact bilaterally. Nose: No congestion/rhinorrhea/epistaxis.  Patient with significant abrasion and dried bloody scab to the nasal bridge.  There is some slight deviation of the nasal bridge.  No septal hematoma appreciated. Mouth/Throat: Mucous membranes are moist. Neck: Supple.   Normal range of motion without crepitus.  No midline tenderness is elicited. Cardiovascular: Normal rate, regular rhythm. Normal distal pulses. Respiratory: Normal respiratory effort. No wheezes/rales/rhonchi. Gastrointestinal: Soft and nontender. No distention. Musculoskeletal: Nontender with normal range of motion in all extremities.  Neurologic:  Normal gait without ataxia. Normal speech and language. No gross focal neurologic deficits are appreciated. Skin:  Skin is warm, dry and intact. No rash noted. Psychiatric: Mood and affect are normal. Patient exhibits appropriate insight and judgment. ____________________________________________   LABS (pertinent positives/negatives) Labs Reviewed  PROTIME-INR - Abnormal; Notable for the following components:      Result Value   Prothrombin Time 20.1 (*)    INR 1.8 (*)    All other components within normal limits  APTT  ____________________________________________   RADIOLOGY  CT Head / Maxillofacial / Cervical Spine  IMPRESSION: Comminuted bilateral nasal bone fractures with slight rightward nasal septal deviation in overlying soft tissue swelling.  No acute fracture or malalignment of the cervical spine.  1. No acute intracranial process. 2. Partial visualization of a comminuted nasal bone fracture. ____________________________________________  PROCEDURES  Ultram 50 mg PO LET topically to nasal bridge  .Marland KitchenLaceration Repair  Date/Time: 07/04/2020 8:06 PM Performed by: Melvenia Needles, PA-C Authorized by: Melvenia Needles, PA-C   Consent:    Consent obtained:  Verbal   Consent given by:  Patient   Risks discussed:  Pain and poor cosmetic result   Alternatives discussed:  No treatment Anesthesia (see MAR for exact dosages):    Anesthesia method:  Topical application   Topical anesthetic:  LET Laceration details:    Location:  Face   Face location:  Nose   Length (cm):  1.5   Depth (mm):  3 Repair  type:    Repair type:  Simple Pre-procedure details:    Preparation:  Imaging obtained to evaluate for foreign bodies Exploration:    Hemostasis achieved with:  LET   Contaminated: no   Treatment:    Area cleansed with:  Saline   Amount of cleaning:  Standard   Irrigation solution:  Sterile saline   Irrigation method:  Tap Skin repair:    Repair method:  Tissue adhesive Approximation:    Approximation:  Close Post-procedure details:    Dressing:  Open (no dressing)   Patient tolerance of procedure:  Tolerated well, no immediate complications   ____________________________________________  INITIAL IMPRESSION / ASSESSMENT AND PLAN / ED COURSE  DDX:  Geriatric patient with ED evaluation of injury sustained following a mechanical fall.  Patient landed on his face after he tripped in a local store.  He sustained abrasions across the face as well as some soft tissue swelling and deformity at the nose.  CT imaging revealed closed bilateral nasal bone fractures without any other acute intracranial process or cervical spine findings.  Patient was otherwise stable during his course in the ED.  His PT and INR were essentially stable without any indication to discontinue management at this time.  The superficial wound over the nasal bridge was cleansed and repaired using Dermabond.  Patient was discharged with wound care instructions as well as nosebleed precautions.  He will follow-up with his primary provider or [facility] as needed.  [name] was evaluated in Emergency Department on 07/04/2020 for the symptoms described in the history of present illness. He was evaluated in the context of the global COVID-19 pandemic, which necessitated consideration that the patient might be at risk for infection with the SARS-CoV-2 virus that causes COVID-19. Institutional protocols and algorithms that pertain to the evaluation of patients at risk for COVID-19 are in a state of rapid change based on  information released by regulatory bodies including the CDC and federal and state organizations. These policies and algorithms were followed during the patient's care in the ED.  I reviewed the patient's prescription history over the last 12 months in the multi-state controlled substances database(s) that includes [place], [place], [place], [place], [place], [place], [place], [place], [place], [place], [place], [place], [place], and [place].  Results were notable for no curent RX. ____________________________________________  FINAL CLINICAL IMPRESSION(S) / ED DIAGNOSES  Final diagnoses:  Contusion of face, initial encounter  Closed fracture of nasal bone, initial encounter  Facial abrasion, initial encounter      Melvenia Needles, PA-C 07/04/20 2028    Duffy Bruce, MD 07/09/20 1504

## 2020-07-04 NOTE — ED Triage Notes (Signed)
Arrives via [facility].  Tripped when stepping onto curb at [employer].  Facial abrasions noted.  Bleeding controlled.  Takes coumadin.  AAOx3.  Skin warm and dry. NAD

## 2020-07-04 NOTE — Discharge Instructions (Signed)
Your exam and CT scans are good for any acute fracture or dislocation of the cervical spine.  There also is no evidence of any acute bleed in the brain.  You do have a closed nasal fracture bilaterally.  We will refer you to [facility] for further fracture care if needed.  Keep the facial abrasions clean and covered with antibiotic ointment.  Expect swelling and bruising around the face and eyes due to your fall.  Take the pain medicine as needed.

## 2020-07-24 ENCOUNTER — Other Ambulatory Visit: Payer: Self-pay | Admitting: *Deleted

## 2020-07-25 ENCOUNTER — Other Ambulatory Visit: Payer: Self-pay

## 2020-07-25 ENCOUNTER — Ambulatory Visit (INDEPENDENT_AMBULATORY_CARE_PROVIDER_SITE_OTHER): Payer: Medicare Other

## 2020-07-25 DIAGNOSIS — Z5181 Encounter for therapeutic drug level monitoring: Secondary | ICD-10-CM

## 2020-07-25 DIAGNOSIS — I4819 Other persistent atrial fibrillation: Secondary | ICD-10-CM

## 2020-07-25 LAB — POCT INR: INR: 1.9 — AB (ref 2.0–3.0)

## 2020-07-25 NOTE — Patient Instructions (Signed)
-   START NEW DOSAGE of warfarin of 1 tablet every day Please be consistent w/ your greens intake - pick a day each week to have your greens and have them every week on that same day.  Recheck in 3 weeks.

## 2020-08-15 ENCOUNTER — Ambulatory Visit (INDEPENDENT_AMBULATORY_CARE_PROVIDER_SITE_OTHER): Payer: Medicare Other

## 2020-08-15 ENCOUNTER — Other Ambulatory Visit: Payer: Self-pay

## 2020-08-15 DIAGNOSIS — I4819 Other persistent atrial fibrillation: Secondary | ICD-10-CM

## 2020-08-15 DIAGNOSIS — Z5181 Encounter for therapeutic drug level monitoring: Secondary | ICD-10-CM | POA: Diagnosis not present

## 2020-08-15 LAB — POCT INR: INR: 4.3 — AB (ref 2.0–3.0)

## 2020-08-15 NOTE — Patient Instructions (Signed)
-   skip warfarin today and tomorrow, then  - continue of warfarin of 1 tablet every day Please be consistent w/ your greens intake - pick a day each week to have your greens and have them every week on that same day.  Recheck in 2 weeks.

## 2020-08-16 NOTE — Patient Outreach (Signed)
[name]) Care Management  07/24/2020  Jeffery Reynolds June 03, 1934 [id number]   RN Health Coach attempted follow up outreach call to patient.  Patient was unavailable. No voicemail left Plan: RN will call patient again within 30 days.  [place] Care Management [phone]

## 2020-08-21 ENCOUNTER — Other Ambulatory Visit: Payer: Self-pay | Admitting: *Deleted

## 2020-08-29 ENCOUNTER — Other Ambulatory Visit: Payer: Self-pay

## 2020-08-29 ENCOUNTER — Ambulatory Visit (INDEPENDENT_AMBULATORY_CARE_PROVIDER_SITE_OTHER): Payer: Medicare Other

## 2020-08-29 DIAGNOSIS — I4819 Other persistent atrial fibrillation: Secondary | ICD-10-CM

## 2020-08-29 DIAGNOSIS — Z5181 Encounter for therapeutic drug level monitoring: Secondary | ICD-10-CM

## 2020-08-29 LAB — POCT INR: INR: 2.7 (ref 2.0–3.0)

## 2020-08-29 NOTE — Patient Instructions (Signed)
-   continue of warfarin of 1 tablet every day Please be consistent w/ your greens intake - pick a day each week to have your greens and have them every week on that same day.  - Recheck in 4 weeks.

## 2020-09-04 ENCOUNTER — Encounter: Payer: Self-pay | Admitting: *Deleted

## 2020-09-14 NOTE — Patient Outreach (Signed)
[facility]) [place] [phone] Late entry  MENDEL BINSFELD 1933/10/07 [id number]  RN Health Coach telephone call to patient.  [employer] compliance verified. Per patient he is doing good. Per patient he did have a recent fall tripping when getting out of car. He did have some facial injury that is healing well. He injured his back in the gym 3 weeks go. Per patient the Dr told him it was arthritis.  Patient has not went back to the gym due to the soreness. Patient stated that his appetite is good. RN discussed eating healthy and monitoring the sodium in his diet. RN discussed elevated triglycerides.  He is using his CPAP. Patient has agreed to follow up outreach calls.  Encounter Medications:  Outpatient Encounter Medications as of 08/21/2020  Medication Sig  . allopurinol (ZYLOPRIM) 300 MG tablet TAKE 1 TABLET ON MONDAY, WEDNESDAY AND FRIDAY  . Artificial Tear Ointment (DRY EYES OP) Apply 1 drop 3 (three) times daily to eye.  Marland Kitchen atorvastatin (LIPITOR) 40 MG tablet TAKE 1 TABLET BY MOUTH EVERY DAY  . cyanocobalamin 1000 MCG tablet Take by mouth.  . furosemide (LASIX) 20 MG tablet TAKE 1 TABLET BY MOUTH EVERY OTHER DAY FOR FLUID RETENTION  . metoprolol tartrate (LOPRESSOR) 50 MG tablet TAKE 1 TABLET(50 MG) BY MOUTH TWICE DAILY  . Pedialyte (PEDIALYTE) SOLN Take 240 mLs by mouth.  . warfarin (COUMADIN) 5 MG tablet TAKE 1 TABLET BY MOUTH DAILY AT 6 PM. TAKE 1 TABLET BY MOUTH ALL DAYS EXCEPT FOR MONDAY AND FRIDAY. MONDAY AND FRIDAY PT TAKES 2.5 MG.   No facility-administered encounter medications on file as of 08/21/2020.    Functional Status:  In your present state of health, do you have any difficulty performing the following activities: 09/27/2019 09/21/2019  Hearing? N N  Vision? N N  Difficulty concentrating or making decisions? Y Y  Comment Dx of dementia -  Walking or climbing stairs? N N  Comment Cane in use as needed. -  Dressing or bathing? Y  Y  Comment Son assist with dressing as needed. tying shoes. His sone [name]  Doing errands, shopping? Y N  Comment He does not currently drive. -  Preparing Food and eating ? [name]  Comment Son prepares meals. Self feeds. [name]  Using the Toilet? N N  In the past six months, have you accidently leaked urine? N N  Do you have problems with loss of bowel control? N N  Managing your Medications? Y Y  Comment Son assist [name]  Managing your Finances? Y Y  Comment Son assist [name]  Housekeeping or managing your Housekeeping? Y Y  Comment Son assist ed assists  Some recent data might be hidden    Fall/Depression Screening: Fall Risk  08/21/2020 03/21/2020 01/25/2020  Falls in the past year? 1 1 1   Comment - - -  Number falls in past yr: 0 1 1  Injury with Fall? 1 1 0  Comment - - -  Risk for fall due to : History of fall(s);Impaired balance/gait;Impaired mobility History of fall(s);Impaired balance/gait;Impaired mobility -  Follow up Falls evaluation completed Falls evaluation completed Falls evaluation completed  Comment - - -   PHQ 2/9 Scores 10/10/2019 09/21/2019 09/24/2018 09/23/2017 09/22/2016 01/30/2016 09/11/2015  PHQ - 2 Score 0 0 0 0 0 0 0    Assessment:  Goals Addressed  This Visit's Progress   . (THN)Track and Manage Heart Rate and Rhythm-Atrial Fibrillation       Timeframe:  Long-Range Goal Priority:  High Start Date: [id number]                            Expected End Date:       [id number]                Follow Up Date [id number]   - check pulse (heart) rate once a day - make a plan to eat healthy - keep all lab appointments - stop my alcohol use    Why is this important?    Atrial fibrillation may have no symptoms. Sometimes the symptoms get worse or happen more often.   It is important to keep track of what your symptoms are and when they happen.   A change in symptoms is important to discuss with your doctor or nurse.   Being  active and healthy eating will also help you manage your heart condition.     Notes:        Plan:  Follow-up:  Patient agrees to Care Plan and Follow-up. Provided Calendar for documentation Provided education on Fall prevention and getting up from a fall Provided education on Back Stretches and strengthening exercises RN discussed medication adherence RN discussed Healthy eating Provided educational material on Triglycerides RN Sent update assessment to PCP RN will follow update within the month of March  Emauri Krygier [place] Management [phone].

## 2020-09-14 NOTE — Patient Instructions (Signed)
Goals Addressed            This Visit's Progress   . (THN)Track and Manage Heart Rate and Rhythm-Atrial Fibrillation       Timeframe:  Long-Range Goal Priority:  High Start Date: [id number]                            Expected End Date:       [id number]                Follow Up Date [id number]   - check pulse (heart) rate once a day - make a plan to eat healthy - keep all lab appointments - stop my alcohol use    Why is this important?    Atrial fibrillation may have no symptoms. Sometimes the symptoms get worse or happen more often.   It is important to keep track of what your symptoms are and when they happen.   A change in symptoms is important to discuss with your doctor or nurse.   Being active and healthy eating will also help you manage your heart condition.     Notes:

## 2020-09-22 ENCOUNTER — Other Ambulatory Visit: Payer: Self-pay | Admitting: Internal Medicine

## 2020-09-26 ENCOUNTER — Other Ambulatory Visit: Payer: Self-pay

## 2020-09-26 ENCOUNTER — Ambulatory Visit (INDEPENDENT_AMBULATORY_CARE_PROVIDER_SITE_OTHER): Payer: Medicare Other

## 2020-09-26 DIAGNOSIS — Z5181 Encounter for therapeutic drug level monitoring: Secondary | ICD-10-CM | POA: Diagnosis not present

## 2020-09-26 DIAGNOSIS — I4819 Other persistent atrial fibrillation: Secondary | ICD-10-CM

## 2020-09-26 LAB — POCT INR: INR: 3.5 — AB (ref 2.0–3.0)

## 2020-09-26 NOTE — Patient Instructions (Signed)
-   skip warfarin tonight - continue of warfarin of 1 tablet every day Please be consistent w/ your greens intake - pick a day each week to have your greens and have them every week on that same day.  - Recheck in 4 weeks.

## 2020-09-27 ENCOUNTER — Ambulatory Visit (INDEPENDENT_AMBULATORY_CARE_PROVIDER_SITE_OTHER): Payer: Medicare Other

## 2020-09-27 VITALS — Ht 68.0 in | Wt 205.0 lb

## 2020-09-27 DIAGNOSIS — Z0000 Encounter for general adult medical examination without abnormal findings: Secondary | ICD-10-CM | POA: Diagnosis not present

## 2020-09-27 NOTE — Progress Notes (Signed)
Subjective:   Jeffery Reynolds is a 87 y.o. male who presents for Medicare Annual/Subsequent preventive examination.  Review of Systems    No ROS.  Medicare Wellness Virtual Visit.   Cardiac Risk Factors include: advanced age (>56men, >32 women);male gender;hypertension     Objective:    Today's Vitals   09/27/20 1138  Weight: 205 lb (93 kg)  Height: 5\' 8"  (1.727 m)   Body mass index is 31.17 kg/m.  Advanced Directives 09/27/2020 07/04/2020 09/27/2019 09/21/2019 06/10/2019 09/24/2018 04/16/2018  Does Patient Have a Medical Advance Directive? Yes No Yes Yes Yes Yes Yes  Type of Paramedic of [place];Living will - [place];Living will - Healthcare Power of [place];Living will [place];Living will  Does patient want to make changes to medical advance directive? No - Patient declined - No - Patient declined No - Patient declined No - Guardian declined No - Patient declined -  Copy of [place] in Chart? No - copy requested - No - copy requested - - No - copy requested -  Would patient like information on creating a medical advance directive? - No - Patient declined - - - - -    Current Medications (verified) Outpatient Encounter Medications as of 09/27/2020  Medication Sig  . allopurinol (ZYLOPRIM) 300 MG tablet TAKE 1 TABLET ON MONDAY, [place]  . Artificial Tear Ointment (DRY EYES OP) Apply 1 drop 3 (three) times daily to eye.  Marland Kitchen atorvastatin (LIPITOR) 40 MG tablet TAKE 1 TABLET BY MOUTH EVERY DAY  . cyanocobalamin 1000 MCG tablet Take by mouth.  . furosemide (LASIX) 20 MG tablet TAKE 1 TABLET BY MOUTH EVERY OTHER DAY FOR FLUID RETENTION  . metoprolol tartrate (LOPRESSOR) 50 MG tablet TAKE 1 TABLET(50 MG) BY MOUTH TWICE DAILY  . Pedialyte (PEDIALYTE) SOLN Take 240 mLs by mouth.  . warfarin (COUMADIN) 5 MG tablet TAKE 1 TABLET BY MOUTH DAILY AT 6 PM. TAKE 1 TABLET  BY MOUTH ALL DAYS EXCEPT FOR MONDAY AND FRIDAY. MONDAY AND FRIDAY PT TAKES 2.5 MG.   No facility-administered encounter medications on file as of 09/27/2020.    Allergies (verified) Patient has no known allergies.   History: Past Medical History:  Diagnosis Date  . Arthritis   . CAD (coronary artery disease)    a. s/p 6-V CABG in [place] in 1995; b. MV 4/12: mild intensity, fixed perfusion defect involving the basal to mid anterior wall consistent with injury to the diagonal distribution (this finding was also noted on prior study dating back to 2008). Overall, no ischemia was noted and there was no high-risk prognostic indicators based on this study. EF 63%  . Carotid artery disease ([place])    a. ultrasound 12/16: 1-39% bilateral ICA stenosis  . Chronic atrial fibrillation ([place]) 2000   a. CHADS2VASc => 4 (HTN, age x 2, vascular disease); b. on Coumadin [facility]  . Chronic back pain   . Cognitive impairment   . Colon polyps   . Diastolic dysfunction    a. TTE 4/10: EF of 07%, mild diastolic dysfunction, no significant valvular abnormalities, mild ascending aortic root dilatation, moderate left atrial enlargement, technically difficult study  . Genital warts   . GERD (gastroesophageal reflux disease)   . Gout    Controlled with Allopurinol  . History of blood transfusion   . History of chicken pox   . Hyperlipidemia   . Hypertension   .  Seasonal allergies   . Sleep apnea, obstructive   . 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 CARDIAC CATHETERIZATION    . CORONARY ARTERY BYPASS GRAFT  1995   CABG x 6 in [place], [facility]    . GYNECOMASTIA EXCISION    . KNEE SURGERY     bilateral   . ROTATOR CUFF REPAIR     right  . TONSILLECTOMY AND ADENOIDECTOMY  1943   Family History  Problem Relation Age of Onset  . Heart disease Father   . Heart attack Father 31       MI  . Alcohol abuse Father   .  Hyperlipidemia Father   . Hypertension Father   . Heart attack Paternal Grandfather 56  . Alcohol abuse Paternal Grandfather   . Cancer Mother        Breast cancer and Lung Cancer  . Diabetes Brother   . Dementia Brother   . Crohn's disease Daughter   . Cancer Brother        lung cancer  . Alcohol abuse Son   . Alcohol abuse Maternal Grandfather   . Alcohol abuse Son   . Heart attack Paternal Grandmother 33   Social History   Socioeconomic History  . Marital status: Divorced    Spouse name: Not on file  . Number of children: 7  . Years of education: 41  . Highest education level: Not on file  Occupational History  . 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 Smoking status: Never Smoker  . Smokeless tobacco: Never Used  Vaping Use  . Vaping Use: Never used  Substance and Sexual Activity  . Alcohol use: Not Currently  . Drug use: No  . Sexual activity: Not Currently  Other Topics Concern  . Not on file  Social History Narrative   Mr. Legate grew up in [place], [place]. Most recently he was living in [place]. He moved to the [place] area since March 2015. Recently divorced and moved to this area to be close to one of his sons. He enjoys reading and has been going to the gym to exercise.      ives at home with his son, Ed. He has 4 sons. Jasdeep Kepner is his POA.   He is able to complete his ADLs except needs some assist at intervals from Ed to put his shoes on. ED completes the iADLs like preparing food, cleaning the home, bills, errands and provides transportation to medical appointments. T   Social Determinants of Health   Financial Resource Strain: Low Risk   . Difficulty of Paying Living Expenses: Not hard at all  Food Insecurity: No Food Insecurity  . Worried About Charity fundraiser in the Last Year: Never true  . Ran Out of Food in the Last Year: Never true  Transportation Needs: No Transportation Needs  . Lack of  Transportation (Medical): No  . Lack of Transportation (Non-Medical): No  Physical Activity: Not on file  Stress: No Stress Concern Present  . Feeling of Stress : Not at all  Social Connections: Moderately Integrated  . Frequency of Communication with Friends and Family: Three times a week  . Frequency of Social Gatherings with Friends and Family: Three times a week  . Attends Religious Services: 1 to 4 times per year  . Active Member of Clubs or Organizations: Yes  .  Attends Archivist Meetings: 1 to 4 times per year  . Marital Status: Divorced    Tobacco Counseling Counseling given: Not Answered   Clinical Intake:  Pre-visit preparation completed: Yes        Diabetes: No  How often do you need to have someone help you when you read instructions, pamphlets, or other written materials from your doctor or pharmacy?: [place] Needed?: No    Activities of Daily Living In your present state of health, do you have any difficulty performing the following activities: 09/27/2020  Hearing? N  Vision? N  Difficulty concentrating or making decisions? Y  Walking or climbing stairs? Y  Dressing or bathing? N  Doing errands, shopping? Y  Preparing Food and eating ? N  Using the Toilet? N  In the past six months, have you accidently leaked urine? N  Do you have problems with loss of bowel control? N  Managing your Medications? Y  Comment Son assist  Managing your Finances? Y  Comment Son Writer or managing your Housekeeping? Y  Comment Son assist  Some recent data might be hidden    Patient Care Team: Crecencio Mc, MD as PCP - General (Internal Medicine) Wellington Hampshire, MD as PCP - Cardiology (Cardiology) [place], Eppie Gibson, RN as [place] any recent [place] you may have received from other than [facility] providers in the past year (date may be approximate).     Assessment:   This  is a routine wellness examination for [place].  I connected with Deric today by telephone and verified that I am speaking with the correct person using two identifiers. Location patient: home Location provider: work Persons participating in the virtual visit: patient, Marine scientist.    I discussed the limitations, risks, security and privacy concerns of performing an evaluation and management service by telephone and the availability of in person appointments. The patient expressed understanding and verbally consented to this telephonic visit.    Interactive audio and video telecommunications were attempted between this provider and patient, however failed, due to patient having technical difficulties OR patient did not have access to video capability.  We continued and completed visit with audio only.  Some vital signs may be absent or patient reported.   Hearing/Vision screen  Hearing Screening   125Hz  250Hz  500Hz  1000Hz  2000Hz  3000Hz  4000Hz  6000Hz  8000Hz   Right ear:           Left ear:           Comments: Patient is able to hear conversational tones without difficulty.  No issues reported.  Vision Screening Comments: Wears corrective lenses  Visual acuity not assessed, virtual visit.   Goal: Follow up with PCP  Dietary issues and exercise activities discussed:    Healthy diet Good water intake  Depression Screen PHQ 2/9 Scores 09/27/2020 10/10/2019 09/21/2019 09/24/2018 09/23/2017 09/22/2016 01/30/2016  PHQ - 2 Score 0 0 0 0 0 0 0    Fall Risk Fall Risk  09/27/2020 08/21/2020 03/21/2020 01/25/2020 10/10/2019  Falls in the past year? 1 1 1 1  0  Comment - - - - -  Number falls in past yr: 1 0 1 1 0  Injury with Fall? - 1 1 0 0  Comment - - - - -  Risk for fall due to : Impaired balance/gait History of fall(s);Impaired balance/gait;Impaired mobility History of fall(s);Impaired balance/gait;Impaired mobility - Impaired balance/gait  Follow up Falls evaluation  completed Falls evaluation completed  Falls evaluation completed Falls evaluation completed Falls evaluation completed  Comment - - - - -    FALL RISK PREVENTION PERTAINING TO THE HOME: Handrails in use when climbing stairs? Yes Home free of loose throw rugs in walkways, pet beds, electrical cords, etc? Yes  Adequate lighting in your home to reduce risk of falls? Yes   ASSISTIVE DEVICES UTILIZED TO PREVENT FALLS: Use of a cane, walker or w/c? Yes   TIMED UP AND GO: Was the test performed? No . Virtual visit.   Cognitive Function: Patient is alert Dementia  Followed by Neurology  MMSE - Mini Mental State Exam 09/27/2019 09/23/2017 09/22/2016 09/11/2015  Not completed: Unable to complete - - -  Orientation to time - 5 5 5   Orientation to Place - 5 5 5   Registration - 3 3 3   Attention/ Calculation - 5 5 5   Recall - 3 1 3   Recall-comments - - 1 out of 3 words recalled -  Language- name 2 objects - 2 2 2   Language- repeat - 1 1 1   Language- follow 3 step command - 3 3 3   Language- read & follow direction - 1 1 1   Write a sentence - 1 1 1   Copy design - 1 1 1   Total score - 30 28 30      6CIT Screen 09/24/2018  What Year? 0 points  What month? 0 points  What time? 0 points  Count back from 20 0 points  Months in reverse 0 points  Repeat phrase 0 points  Total Score 0    Immunizations Immunization History  Administered Date(s) Administered  . Influenza, High Dose Seasonal PF 05/26/2016, 07/10/2017, 05/05/2018  . Influenza,inj,Quad PF,6+ Mos 06/10/2014  . Influenza-Unspecified 06/30/2015  . PFIZER(Purple Top)SARS-COV-2 Vaccination 10/07/2019, 11/04/2019  . Pneumococcal Conjugate-13 08/10/2013  . Pneumococcal Polysaccharide-23 08/14/2014  . Tdap 11/12/2013, 04/16/2018  . Zoster 04/19/2014   Health Maintenance Health Maintenance  Topic Date Due  . COVID-19 Vaccine (3 - Pfizer risk 4-dose series) 10/13/2020 (Originally 12/02/2019)  . INFLUENZA VACCINE  11/22/2020 (Originally 03/25/2020)  . TETANUS/TDAP   04/16/2028  . PNA vac Low Risk Adult  Completed   Colorectal cancer screening: No longer required.   Lung Cancer Screening: (Low Dose CT Chest recommended if Age 74-80 years, 30 pack-year currently smoking OR have quit w/in 15years.) does not qualify.   Hepatitis C Screening: does not qualify.  Vision Screening: Recommended annual ophthalmology exams for early detection of glaucoma and other disorders of the eye. Is the patient up to date with their annual eye exam?  Yes   Dental Screening: Recommended annual dental exams for proper oral hygiene. Visits every 12 months.   Community Resource Referral / Chronic Care Management: CRR required this visit?  No   CCM required this visit?  No      Plan:   Keep all routine maintenance appointments.   Next scheduled nurse visit 10/01/20 @ 3:00; flu vaccine.  I have personally reviewed and noted the following in the patient's chart:   . Medical and social history . Use of alcohol, tobacco or illicit drugs  . Current medications and supplements . Functional ability and status . Nutritional status . Physical activity . Advanced directives . List of other physicians . Hospitalizations, surgeries, and ER visits in previous 12 months . Vitals . Screenings to include cognitive, depression, and falls . Referrals and appointments  In addition, I have reviewed and discussed with patient certain  preventive protocols, quality metrics, and best practice recommendations. A written personalized care plan for preventive services as well as general preventive health recommendations were provided to patient via mychart.     Varney Biles, LPN   [id number]

## 2020-09-27 NOTE — Patient Instructions (Addendum)
Jeffery Reynolds , Thank you for taking time to come for your Medicare Wellness Visit. I appreciate your ongoing commitment to your health goals. Please review the following plan we discussed and let me know if I can assist you in the future.   These are the goals we discussed:  Goal: Follow up with PCP   This is a list of the screening recommended for you and due dates:  Health Maintenance  Topic Date Due  . COVID-19 Vaccine (3 - Pfizer risk 4-dose series) 10/13/2020*  . Flu Shot  11/22/2020*  . Tetanus Vaccine  04/16/2028  . Pneumonia vaccines  Completed  *Topic was postponed. The date shown is not the original due date.    Immunizations Immunization History  Administered Date(s) Administered  . Influenza, High Dose Seasonal PF 05/26/2016, 07/10/2017, 05/05/2018  . Influenza,inj,Quad PF,6+ Mos 06/10/2014  . Influenza-Unspecified 06/30/2015  . PFIZER(Purple Top)SARS-COV-2 Vaccination 10/07/2019, 11/04/2019  . Pneumococcal Conjugate-13 08/10/2013  . Pneumococcal Polysaccharide-23 08/14/2014  . Tdap 11/12/2013, 04/16/2018  . Zoster 04/19/2014   Keep all routine maintenance appointments.   Next scheduled nurse visit 10/01/20 @ 3:00; flu vaccine.   Advanced directives: End of life planning; Advance aging; Advanced directives discussed.  Copy of current HCPOA/Living Will requested.    Conditions/risks identified: none new.  Follow up in one year for your annual wellness visit.   Preventive Care 59 Years and Older, Male Preventive care refers to lifestyle choices and visits with your health care provider that can promote health and wellness. What does preventive care include?  A yearly physical exam. This is also called an annual well check.  Dental exams once or twice a year.  Routine eye exams. Ask your health care provider how often you should have your eyes checked.  Personal lifestyle choices, including:  Daily care of your teeth and gums.  Regular physical  activity.  Eating a healthy diet.  Avoiding tobacco and drug use.  Limiting alcohol use.  Practicing safe sex.  Taking low doses of aspirin every day.  Taking vitamin and mineral supplements as recommended by your health care provider. What happens during an annual well check? The services and screenings done by your health care provider during your annual well check will depend on your age, overall health, lifestyle risk factors, and family history of disease. Counseling  Your health care provider may ask you questions about your:  Alcohol use.  Tobacco use.  Drug use.  Emotional well-being.  Home and relationship well-being.  Sexual activity.  Eating habits.  History of falls.  Memory and ability to understand (cognition).  Work and work Statistician. Screening  You may have the following tests or measurements:  Height, weight, and BMI.  Blood pressure.  Lipid and cholesterol levels. These may be checked every 5 years, or more frequently if you are over 9 years old.  Skin check.  Lung cancer screening. You may have this screening every year starting at age 55 if you have a 30-pack-year history of smoking and currently smoke or have quit within the past 15 years.  Fecal occult blood test (FOBT) of the stool. You may have this test every year starting at age 9.  Flexible sigmoidoscopy or colonoscopy. You may have a sigmoidoscopy every 5 years or a colonoscopy every 10 years starting at age 4.  Prostate cancer screening. Recommendations will vary depending on your family history and other risks.  Hepatitis C blood test.  Hepatitis B blood test.  Sexually transmitted disease (STD)  testing.  Diabetes screening. This is done by checking your blood sugar (glucose) after you have not eaten for a while (fasting). You may have this done every 1-3 years.  Abdominal aortic aneurysm (AAA) screening. You may need this if you are a current or former  smoker.  Osteoporosis. You may be screened starting at age 19 if you are at high risk. Talk with your health care provider about your test results, treatment options, and if necessary, the need for more tests. Vaccines  Your health care provider may recommend certain vaccines, such as:  Influenza vaccine. This is recommended every year.  Tetanus, diphtheria, and acellular pertussis (Tdap, Td) vaccine. You may need a Td booster every 10 years.  Zoster vaccine. You may need this after age 32.  Pneumococcal 13-valent conjugate (PCV13) vaccine. One dose is recommended after age 55.  Pneumococcal polysaccharide (PPSV23) vaccine. One dose is recommended after age 75. Talk to your health care provider about which screenings and vaccines you need and how often you need them. This information is not intended to replace advice given to you by your health care provider. Make sure you discuss any questions you have with your health care provider. Document Released: 09/07/2015 Document Revised: 04/30/2016 Document Reviewed: 06/12/2015 Elsevier Interactive Patient Education  2017 [employer] Prevention in the Home Falls can cause injuries. They can happen to people of all ages. There are many things you can do to make your home safe and to help prevent falls. What can I do on the outside of my home?  Regularly fix the edges of walkways and driveways and fix any cracks.  Remove anything that might make you trip as you walk through a door, such as a raised step or threshold.  Trim any bushes or trees on the path to your home.  Use bright outdoor lighting.  Clear any walking paths of anything that might make someone trip, such as rocks or tools.  Regularly check to see if handrails are loose or broken. Make sure that both sides of any steps have handrails.  Any raised decks and porches should have guardrails on the edges.  Have any leaves, snow, or ice cleared regularly.  Use sand or  salt on walking paths during winter.  Clean up any spills in your garage right away. This includes oil or grease spills. What can I do in the bathroom?  Use night lights.  Install grab bars by the toilet and in the tub and shower. Do not use towel bars as grab bars.  Use non-skid mats or decals in the tub or shower.  If you need to sit down in the shower, use a plastic, non-slip stool.  Keep the floor dry. Clean up any water that spills on the floor as soon as it happens.  Remove soap buildup in the tub or shower regularly.  Attach bath mats securely with double-sided non-slip rug tape.  Do not have throw rugs and other things on the floor that can make you trip. What can I do in the bedroom?  Use night lights.  Make sure that you have a light by your bed that is easy to reach.  Do not use any sheets or blankets that are too big for your bed. They should not hang down onto the floor.  Have a firm chair that has side arms. You can use this for support while you get dressed.  Do not have throw rugs and other things on the floor  that can make you trip. What can I do in the kitchen?  Clean up any spills right away.  Avoid walking on wet floors.  Keep items that you use a lot in easy-to-reach places.  If you need to reach something above you, use a strong step stool that has a grab bar.  Keep electrical cords out of the way.  Do not use floor polish or wax that makes floors slippery. If you must use wax, use non-skid floor wax.  Do not have throw rugs and other things on the floor that can make you trip. What can I do with my stairs?  Do not leave any items on the stairs.  Make sure that there are handrails on both sides of the stairs and use them. Fix handrails that are broken or loose. Make sure that handrails are as long as the stairways.  Check any carpeting to make sure that it is firmly attached to the stairs. Fix any carpet that is loose or worn.  Avoid having  throw rugs at the top or bottom of the stairs. If you do have throw rugs, attach them to the floor with carpet tape.  Make sure that you have a light switch at the top of the stairs and the bottom of the stairs. If you do not have them, ask someone to add them for you. What else can I do to help prevent falls?  Wear shoes that:  Do not have high heels.  Have rubber bottoms.  Are comfortable and fit you well.  Are closed at the toe. Do not wear sandals.  If you use a stepladder:  Make sure that it is fully opened. Do not climb a closed stepladder.  Make sure that both sides of the stepladder are locked into place.  Ask someone to hold it for you, if possible.  Clearly mark and make sure that you can see:  Any grab bars or handrails.  First and last steps.  Where the edge of each step is.  Use tools that help you move around (mobility aids) if they are needed. These include:  Canes.  Walkers.  Scooters.  Crutches.  Turn on the lights when you go into a dark area. Replace any light bulbs as soon as they burn out.  Set up your furniture so you have a clear path. Avoid moving your furniture around.  If any of your floors are uneven, fix them.  If there are any pets around you, be aware of where they are.  Review your medicines with your doctor. Some medicines can make you feel dizzy. This can increase your chance of falling. Ask your doctor what other things that you can do to help prevent falls. This information is not intended to replace advice given to you by your health care provider. Make sure you discuss any questions you have with your health care provider. Document Released: 06/07/2009 Document Revised: 01/17/2016 Document Reviewed: 09/15/2014 Elsevier Interactive Patient Education  2017 [employer].

## 2020-09-29 ENCOUNTER — Other Ambulatory Visit: Payer: Self-pay | Admitting: Internal Medicine

## 2020-10-01 ENCOUNTER — Ambulatory Visit: Payer: Medicare Other

## 2020-10-02 ENCOUNTER — Other Ambulatory Visit: Payer: Self-pay | Admitting: Internal Medicine

## 2020-10-03 ENCOUNTER — Telehealth: Payer: Self-pay

## 2020-10-03 ENCOUNTER — Telehealth: Payer: Self-pay | Admitting: Internal Medicine

## 2020-10-03 NOTE — Telephone Encounter (Signed)
This is a Dr Derrel Nip patient. I am forwarding this to her.

## 2020-10-03 NOTE — Telephone Encounter (Signed)
Patient's son, Percell Miller called, Phone number is [phone]. Patient would like to know if he should stop his allopurinol (ZYLOPRIM) 300 MG tablet, because he is also on warfarin (COUMADIN) 5 MG tablet. Patient's pharmacy brought this to patient's son attention when picking up medications.

## 2020-10-03 NOTE — Telephone Encounter (Signed)
No . He should not stop allopurinol.. He has been on both medications and he is having his  coumadin level checked regularly

## 2020-10-03 NOTE — Telephone Encounter (Signed)
Patient's son, Percell Miller called, Phone number is [phone]. Patient would like to know if he should stop his allopurinol (ZYLOPRIM) 300 MG tablet, because he is also on warfarin (COUMADIN) 5 MG tablet. Patient's pharmacy brought this to patient's son attention when picking up medications. nina,cma

## 2020-10-03 NOTE — Telephone Encounter (Signed)
Patient son calling about the possible contraindication between his warfarin and the allopurinol (he takes for gout)  Please advise

## 2020-10-03 NOTE — Telephone Encounter (Signed)
Spoke w/ pt's son Ed. Advised him that pt has been on allopurinol for years and that we are aware of the contraindication and that his warfarin dosage is adjusted accordingly. Advised him that we receive a message on our end when we send in the warfarin refill and his PCP will see the same thing when they refill his allopurinol. He states that Dr. Derrel Nip advised him that he may not need the allopurinol anymore, so he might just stop it. Advised him that if he does stop the allopurinol, that his INR may drop, so I moved his next INR check up by a week.  He is still undecided if he will stop it or not, but is waiting on a call back from Dr. Lupita Dawn office.  Advised him again that I will adjust pt's warfarin dosage accordingly. He is appreciative of the call.

## 2020-10-04 NOTE — Telephone Encounter (Signed)
I called and spoke with the patient and his son and explained to them that Dr. Derrel Nip does not want him to stop taking his allopurinol because he checks his coumadin regularly and they both understood.  Nyari Olsson,cma

## 2020-10-17 ENCOUNTER — Other Ambulatory Visit: Payer: Self-pay

## 2020-10-17 ENCOUNTER — Ambulatory Visit (INDEPENDENT_AMBULATORY_CARE_PROVIDER_SITE_OTHER): Payer: Medicare Other

## 2020-10-17 DIAGNOSIS — Z5181 Encounter for therapeutic drug level monitoring: Secondary | ICD-10-CM

## 2020-10-17 DIAGNOSIS — I4819 Other persistent atrial fibrillation: Secondary | ICD-10-CM

## 2020-10-17 LAB — POCT INR: INR: 2.9 (ref 2.0–3.0)

## 2020-10-17 NOTE — Patient Instructions (Signed)
-   continue of warfarin of 1 tablet every day Please be consistent w/ your greens intake - pick a day each week to have your greens and have them every week on that same day.  - Recheck in 5 weeks.

## 2020-10-19 ENCOUNTER — Other Ambulatory Visit: Payer: Self-pay | Admitting: Internal Medicine

## 2020-11-08 DIAGNOSIS — R410 Disorientation, unspecified: Secondary | ICD-10-CM | POA: Diagnosis not present

## 2020-11-08 DIAGNOSIS — G4733 Obstructive sleep apnea (adult) (pediatric): Secondary | ICD-10-CM | POA: Diagnosis not present

## 2020-11-08 DIAGNOSIS — R413 Other amnesia: Secondary | ICD-10-CM | POA: Diagnosis not present

## 2020-11-15 ENCOUNTER — Other Ambulatory Visit: Payer: Self-pay | Admitting: *Deleted

## 2020-11-15 NOTE — Patient Outreach (Signed)
[facility]) Care Management  11/15/2020  ISAIR INABINET [birth year] [id number]   RN Health Coach attempted follow up outreach call to patient.  Patient was unavailable. HIPPA compliance voicemail message left with return callback number.  Plan: RN will call patient again within 30 days.  [place] Care Management [phone]

## 2020-11-21 ENCOUNTER — Other Ambulatory Visit: Payer: Self-pay

## 2020-11-21 ENCOUNTER — Ambulatory Visit (INDEPENDENT_AMBULATORY_CARE_PROVIDER_SITE_OTHER): Payer: Medicare Other

## 2020-11-21 DIAGNOSIS — I4819 Other persistent atrial fibrillation: Secondary | ICD-10-CM | POA: Diagnosis not present

## 2020-11-21 DIAGNOSIS — Z5181 Encounter for therapeutic drug level monitoring: Secondary | ICD-10-CM | POA: Diagnosis not present

## 2020-11-21 LAB — POCT INR: INR: 2.4 (ref 2.0–3.0)

## 2020-11-21 NOTE — Patient Instructions (Signed)
-   continue of warfarin of 1 tablet every day Please be consistent w/ your greens intake - pick a day each week to have your greens and have them every week on that same day.  - Recheck in 6 weeks.

## 2020-12-24 ENCOUNTER — Other Ambulatory Visit: Payer: Self-pay | Admitting: *Deleted

## 2020-12-24 NOTE — Patient Outreach (Signed)
[facility]) Care Management  [place]  12/24/2020   Jeffery Reynolds 26-Dec-1933 [id number]  RN Health Coach telephone call to patient.  Hipaa compliance verified. Per patient he is not having any symptoms of fatigue. Patient stated he has not had any recent falls. Patient ambulates with cane when he goes out. Patient does not ambulate in the home with equipment.  Patient has not had any recent falls. Per patient he has been having diarrhea off and on for the last 6 months. Per patient he has followed up with the Dr. Patient stated that he has not had any weight loss. Patient has agreed to follow up outreach calls.   Encounter Medications:  Outpatient Encounter Medications as of 12/24/2020  Medication Sig  . allopurinol (ZYLOPRIM) 300 MG tablet TAKE 1 TABLET ON MONDAY, WEDNESDAY AND FRIDAY  . Artificial Tear Ointment (DRY EYES OP) Apply 1 drop 3 (three) times daily to eye.  Marland Kitchen atorvastatin (LIPITOR) 40 MG tablet TAKE 1 TABLET BY MOUTH EVERY DAY  . cyanocobalamin 1000 MCG tablet Take by mouth.  . furosemide (LASIX) 20 MG tablet TAKE 1 TABLET BY MOUTH EVERY OTHER DAY FOR FLUID RETENTION  . metoprolol tartrate (LOPRESSOR) 50 MG tablet TAKE 1 TABLET(50 MG) BY MOUTH TWICE DAILY  . Pedialyte (PEDIALYTE) SOLN Take 240 mLs by mouth.  . warfarin (COUMADIN) 5 MG tablet TAKE 1 TABLET BY MOUTH DAILY AT 6 PM. TAKE 1 TABLET BY MOUTH ALL DAYS EXCEPT FOR MONDAY AND FRIDAY. MONDAY AND FRIDAY PT TAKES 2.5 MG.   No facility-administered encounter medications on file as of 12/24/2020.    Functional Status:  In your present state of health, do you have any difficulty performing the following activities: 09/27/2020  Hearing? N  Vision? N  Difficulty concentrating or making decisions? Y  Walking or climbing stairs? Y  Dressing or bathing? N  Doing errands, shopping? Y  Preparing Food and eating ? N  Using the Toilet? N  In the past six months, have you accidently leaked urine? N  Do  you have problems with loss of bowel control? N  Managing your Medications? Y  Comment Son assist  Managing your Finances? Y  Comment Son Writer or managing your Housekeeping? Y  Comment Son assist  Some recent data might be hidden    Fall/Depression Screening: Fall Risk  12/24/2020 09/27/2020 08/21/2020  Falls in the past year? - 1 1  Comment - - -  Number falls in past yr: 0 1 0  Injury with Fall? 1 - 1  Comment - - -  Risk for fall due to : History of fall(s) Impaired balance/gait History of fall(s);Impaired balance/gait;Impaired mobility  Follow up Falls evaluation completed Falls evaluation completed Falls evaluation completed  Comment - - -   PHQ 2/9 Scores 09/27/2020 10/10/2019 09/21/2019 09/24/2018 09/23/2017 09/22/2016 01/30/2016  PHQ - 2 Score 0 0 0 0 0 0 0    Assessment:  Goals Addressed            This Visit's Progress   . (THN)Track and Manage Heart Rate and Rhythm-Atrial Fibrillation       Timeframe:  Long-Range Goal Priority:  High Start Date: 95621308                            Expected End Date:      [id number]            Follow Up  Date [id number]   - check pulse (heart) rate once a day - make a plan to eat healthy - keep all lab appointments - stop my alcohol use    Why is this important?    Atrial fibrillation may have no symptoms. Sometimes the symptoms get worse or happen more often.   It is important to keep track of what your symptoms are and when they happen.   A change in symptoms is important to discuss with your doctor or nurse.   Being active and healthy eating will also help you manage your heart condition.     Notes:        Plan:  Follow-up:  Patient agrees to Care Plan and Follow-up. RN provided Neurosurgeon on Low fiber diet RN discussed healthy eating and diet with son RN requested free COVID tests for patient RN sent update assessment to PCP RN will follow up within the month of August  Jeffery Reynolds [facility] [phone]

## 2020-12-24 NOTE — Patient Instructions (Signed)
Goals Addressed            This Visit's Progress   . (THN)Track and Manage Heart Rate and Rhythm-Atrial Fibrillation       Timeframe:  Long-Range Goal Priority:  High Start Date: 59741638                            Expected End Date:      [id number]            Follow Up Date [id number]   - check pulse (heart) rate once a day - make a plan to eat healthy - keep all lab appointments - stop my alcohol use    Why is this important?    Atrial fibrillation may have no symptoms. Sometimes the symptoms get worse or happen more often.   It is important to keep track of what your symptoms are and when they happen.   A change in symptoms is important to discuss with your doctor or nurse.   Being active and healthy eating will also help you manage your heart condition.     Notes:

## 2020-12-27 ENCOUNTER — Other Ambulatory Visit: Payer: Self-pay | Admitting: Internal Medicine

## 2021-01-02 ENCOUNTER — Other Ambulatory Visit: Payer: Self-pay

## 2021-01-02 ENCOUNTER — Ambulatory Visit (INDEPENDENT_AMBULATORY_CARE_PROVIDER_SITE_OTHER): Payer: Medicare Other

## 2021-01-02 DIAGNOSIS — Z5181 Encounter for therapeutic drug level monitoring: Secondary | ICD-10-CM | POA: Diagnosis not present

## 2021-01-02 DIAGNOSIS — I4819 Other persistent atrial fibrillation: Secondary | ICD-10-CM

## 2021-01-02 LAB — POCT INR: INR: 3.1 — AB (ref 2.0–3.0)

## 2021-01-02 NOTE — Patient Instructions (Signed)
-   have a serving of greens today, then  - continue of warfarin of 1 tablet every day Please be consistent w/ your greens intake - pick a day each week to have your greens and have them every week on that same day.  - Recheck in 6 weeks.

## 2021-01-23 ENCOUNTER — Other Ambulatory Visit: Payer: Self-pay | Admitting: *Deleted

## 2021-01-23 NOTE — Patient Outreach (Signed)
[facility]) Care Management  01/23/2021  Jeffery Reynolds 1934/05/02 [id number]  Patient is being transferred to the Chronic Care Management program with provider office.   [place] Care Management [phone]

## 2021-01-24 ENCOUNTER — Telehealth: Payer: Self-pay

## 2021-01-24 NOTE — Chronic Care Management (AMB) (Signed)
  Chronic Care Management   Note  01/24/2021 Name: IVIS HENNEMAN MRN: [id number] DOB: [birth year]  [name] is a 87 y.o. year old male who is a primary care patient of Derrel Nip, Aris Everts, MD. PLEZ BELTON is currently enrolled in care management services. An additional referral for RN CM  was placed.   Follow up plan: Unsuccessful telephone outreach attempt made. A HIPAA compliant phone message was left for the patient providing contact information and requesting a return call.  The care management team will reach out to the patient again over the next 7 days.  If patient returns call to provider office, please advise to call [place]  at [place], [place], [place], [place] [address] Direct Dial: [phone] [email or web address]: [email or web address]

## 2021-02-04 ENCOUNTER — Telehealth: Payer: Self-pay | Admitting: *Deleted

## 2021-02-04 NOTE — Telephone Encounter (Signed)
  Chronic Care Management   PCP Scheduling Note  02/04/2021 Name: [name] MRN: [id number] DOB: 1934-03-30  Primary Care Provider: Crecencio Mc, MD  Jeffery Reynolds is a 87 y.o. year old male who is a primary care patient of Crecencio Mc, MD. Please note that Jeffery Reynolds has not had a PCP visit in the last 12 months and is in need of care management services. The patient needs to be contacted for PCP visit scheduling and has been referred to the appropriate scheduling resource for the practice.   RNCM contacted scheduler, Santiago Glad and requested she contact patient to schedule appointment with PCP.  Follow Up Plan: The care coordination team will engage the patient to assist with care management and/or care coordination needs after the PCP visit has been scheduled.   [name] RN, MSN RN Care Management Coordinator [place] [phone] [email or web address]

## 2021-02-06 ENCOUNTER — Ambulatory Visit: Payer: Medicare Other | Admitting: Internal Medicine

## 2021-02-06 ENCOUNTER — Telehealth: Payer: Self-pay

## 2021-02-06 DIAGNOSIS — Z0289 Encounter for other administrative examinations: Secondary | ICD-10-CM

## 2021-02-06 NOTE — Telephone Encounter (Signed)
Pt's son called and states that his father was going to be late to his appt. I stated that he had 10 minutes past appt time before it would have to be rescheduled. He stated that he was waiting on his brother to get there to take the pt. He states that it would be twenty minutes and they would be here as soon as they could. I let him know again that it was our policy and he then put his brother on the phone who states that policies are meant to be broken and that thinks it is ridiculous to have a policy in place like that and asked if he would have to reschedule? I said yes and he hung up on me.

## 2021-02-13 ENCOUNTER — Other Ambulatory Visit: Payer: Self-pay

## 2021-02-13 ENCOUNTER — Ambulatory Visit (INDEPENDENT_AMBULATORY_CARE_PROVIDER_SITE_OTHER): Payer: Medicare Other

## 2021-02-13 DIAGNOSIS — Z5181 Encounter for therapeutic drug level monitoring: Secondary | ICD-10-CM

## 2021-02-13 DIAGNOSIS — I4819 Other persistent atrial fibrillation: Secondary | ICD-10-CM | POA: Diagnosis not present

## 2021-02-13 LAB — POCT INR: INR: 2.9 (ref 2.0–3.0)

## 2021-02-13 NOTE — Patient Instructions (Signed)
-   continue of warfarin of 1 tablet every day Please be consistent w/ your greens intake - pick a day each week to have your greens and have them every week on that same day.  - Recheck in 6 weeks.

## 2021-02-18 ENCOUNTER — Ambulatory Visit: Payer: Medicare Other | Admitting: *Deleted

## 2021-02-18 ENCOUNTER — Telehealth: Payer: Self-pay | Admitting: *Deleted

## 2021-02-18 DIAGNOSIS — I482 Chronic atrial fibrillation, unspecified: Secondary | ICD-10-CM

## 2021-02-18 DIAGNOSIS — R443 Hallucinations, unspecified: Secondary | ICD-10-CM

## 2021-02-18 DIAGNOSIS — F409 Phobic anxiety disorder, unspecified: Secondary | ICD-10-CM

## 2021-02-18 DIAGNOSIS — I10 Essential (primary) hypertension: Secondary | ICD-10-CM

## 2021-02-18 NOTE — Telephone Encounter (Signed)
  Care Management   Follow Up Note   02/18/2021 Name: Jeffery Reynolds MRN: [id number] DOB: 07/17/1934   Referred by: Crecencio Mc, MD Reason for referral : Care Coordination (Verify PCP appointment)   An unsuccessful telephone outreach was attempted today. The patient was referred to the case management team for assistance with care management and care coordination.   Patient needs appointment with PCP prior to [facility] services starting.  He missed appointment on 02/06/21.  RNCM attempted to reach patient and son today to help reschedule missed appointment, unable to reach either.  Follow Up Plan: The care management team will reach out to the patient again over the next 30 days to verify patient has attended appointment with PCP or has appointment arranged.   [name] RN, MSN RN Care Management Coordinator [place] [phone] [email or web address]

## 2021-02-18 NOTE — Chronic Care Management (AMB) (Signed)
  Care Management   Follow Up Note   02/18/2021 Name: Jeffery Reynolds MRN: [id number] DOB: Feb 23, 1934   Referred by: Crecencio Mc, MD Reason for referral : Care Coordination (Verify PCP follow up)   Received telephone call back from patient's son, Jeffery Reynolds.   (Release of Information of file).  RNCM introduced self and role and CCM services.  Son acknowledges patient missed appointment with PCP few weeks ago and states he plans to reschedule.  Discussed with son, patient needs to be seen prior to initiation of CCM services.    Son also stating patient is patient just told him, he is hearing music in his ears/head when he lays down to sleep at night.  States the music is keeping him up.  Has Neurology appointment on 7/26.  Encouraged son to contact Neurology for sooner appointment and to rescheduled missed PCP appointment.  Follow Up Plan: The care management team will reach out to the patient again over the next 30 business days to verify PCP appointment.[name] RN, MSN RN Care Management Coordinator [place] [phone] [email or web address]

## 2021-02-21 ENCOUNTER — Other Ambulatory Visit: Payer: Self-pay | Admitting: Internal Medicine

## 2021-02-22 NOTE — Chronic Care Management (AMB) (Signed)
  Chronic Care Management   Note  02/22/2021 Name: Jeffery Reynolds MRN: [id number] DOB: Mar 07, 1934  Jeffery Reynolds is a 87 y.o. year old male who is a primary care patient of Derrel Nip, Aris Everts, MD. I reached out to Jeffery Reynolds by phone today in response to a referral sent by [name] [facility] health plan.     Mr. Quillin was given information about Chronic Care Management services today including:  CCM service includes personalized support from designated clinical staff supervised by his physician, including individualized plan of care and coordination with other care providers 24/7 contact phone numbers for assistance for urgent and routine care needs. Service will only be billed when office clinical staff spend 20 minutes or more in a month to coordinate care. Only one practitioner may furnish and bill the service in a calendar month. The patient may stop CCM services at any time (effective at the end of the month) by phone call to the office staff. The patient will be responsible for cost sharing (co-pay) of up to 20% of the service fee (after annual deductible is met).  Patient agreed to services and verbal consent obtained.   Follow up plan: Telephone appointment with care management team member scheduled for:02/13/2021  Noreene Larsson, [place], [place], [place] [address] Direct Dial: [phone] [email or web address] Website: [email or web address]  \

## 2021-03-08 ENCOUNTER — Telehealth: Payer: Self-pay | Admitting: *Deleted

## 2021-03-08 ENCOUNTER — Telehealth: Payer: Medicare Other

## 2021-03-08 NOTE — Telephone Encounter (Signed)
  Care Management   Follow Up Note   03/08/2021 Name: Jeffery Reynolds MRN: [id number] DOB: [birth year]   Referred by: Jeffery Mc, MD Reason for referral : Care Coordination (Requesting follow up with PCP )   RNCM reviewed chart and noticed patient had not been seen by PCP in the last years time.   Spoke with son, several weeks ago requesting appointment needing to arranged, no appointment in system.  RNCM outreached to PCP office scheduler, Jeffery Reynolds and requested she contact patient to arranged appointment for patient, stated she would.  Once patient seen by primary care provider, RNCM can complete CCM initial assessment.  Follow Up Plan:  RNCM will schedule outreach within the next 45 business days to allow patient to schedule appointment with PCP.  Jeffery Azure RN, MSN RN Care Management Coordinator [place] [phone] [email or web address]

## 2021-03-20 DIAGNOSIS — R413 Other amnesia: Secondary | ICD-10-CM | POA: Diagnosis not present

## 2021-03-20 DIAGNOSIS — R410 Disorientation, unspecified: Secondary | ICD-10-CM | POA: Diagnosis not present

## 2021-03-20 DIAGNOSIS — G4733 Obstructive sleep apnea (adult) (pediatric): Secondary | ICD-10-CM | POA: Diagnosis not present

## 2021-03-20 DIAGNOSIS — G479 Sleep disorder, unspecified: Secondary | ICD-10-CM | POA: Diagnosis not present

## 2021-03-20 DIAGNOSIS — R441 Visual hallucinations: Secondary | ICD-10-CM | POA: Diagnosis not present

## 2021-03-22 ENCOUNTER — Other Ambulatory Visit: Payer: Self-pay | Admitting: Internal Medicine

## 2021-03-27 ENCOUNTER — Other Ambulatory Visit: Payer: Self-pay

## 2021-03-27 ENCOUNTER — Ambulatory Visit (INDEPENDENT_AMBULATORY_CARE_PROVIDER_SITE_OTHER): Payer: Medicare Other

## 2021-03-27 DIAGNOSIS — I4819 Other persistent atrial fibrillation: Secondary | ICD-10-CM

## 2021-03-27 DIAGNOSIS — Z5181 Encounter for therapeutic drug level monitoring: Secondary | ICD-10-CM | POA: Diagnosis not present

## 2021-03-27 LAB — POCT INR: INR: 3.2 — AB (ref 2.0–3.0)

## 2021-03-27 NOTE — Patient Instructions (Signed)
-   take 1/2 tablet warfarin tonight, then  - continue of warfarin of 1 tablet every day Please be consistent w/ your greens intake - pick a day each week to have your greens and have them every week on that same day.  - Recheck in 6 weeks.

## 2021-03-28 ENCOUNTER — Other Ambulatory Visit: Payer: Medicare Other

## 2021-03-28 ENCOUNTER — Telehealth: Payer: Self-pay | Admitting: Internal Medicine

## 2021-03-28 DIAGNOSIS — R6889 Other general symptoms and signs: Secondary | ICD-10-CM

## 2021-03-28 NOTE — Telephone Encounter (Signed)
Patient is coming today at 1430 to be swabbed for covid. Appointment has been placed as virtual tomorrow.

## 2021-03-28 NOTE — Telephone Encounter (Signed)
Patient informed, Due to the high volume of calls and your symptoms we have to forward your call to our Triage Nurse to expedient your call. Please hold for the transfer.  Patient transferred to [facility]. Due to having a nose that is constantly running like a stream and his eyes are very red.No openings available in office or virtual.

## 2021-03-28 NOTE — Telephone Encounter (Signed)
Patient Already has appointment tomorrow with PCP.

## 2021-03-28 NOTE — Addendum Note (Signed)
Addended by: Elpidio Galea T on: 03/28/2021 01:36 PM   Modules accepted: Orders

## 2021-03-29 ENCOUNTER — Telehealth (INDEPENDENT_AMBULATORY_CARE_PROVIDER_SITE_OTHER): Payer: Medicare Other | Admitting: Internal Medicine

## 2021-03-29 ENCOUNTER — Other Ambulatory Visit: Payer: Self-pay

## 2021-03-29 DIAGNOSIS — F0150 Vascular dementia without behavioral disturbance: Secondary | ICD-10-CM

## 2021-03-29 DIAGNOSIS — J302 Other seasonal allergic rhinitis: Secondary | ICD-10-CM

## 2021-03-29 LAB — NOVEL CORONAVIRUS, NAA: SARS-CoV-2, NAA: NOT DETECTED

## 2021-03-29 LAB — SARS-COV-2, NAA 2 DAY TAT

## 2021-03-29 NOTE — Progress Notes (Signed)
Virtual Visit via [facility] Note  This visit type was conducted due to national recommendations for restrictions regarding the COVID-19 pandemic (e.g. social distancing).  This format is felt to be most appropriate for this patient at this time.  All issues noted in this document were discussed and addressed.  No physical exam was performed (except for noted visual exam findings with Video Visits).   I connected withNAME@ on 03/29/21 at 11:00 AM EDT by a video enabled telemedicine application and verified that I am speaking with the correct person using two identifiers. Location patient: home Location provider: work or home office Persons participating in the virtual visit: patient, provider and multiple adult sons and daughters (3)   I discussed the limitations, risks, security and privacy concerns of performing an evaluation and management service by telephone and the availability of in person appointments. I also discussed with the patient that there may be a patient responsible charge related to this service. The patient expressed understanding and agreed to proceed.  Reason for visit: follow up on dementia  HPI:  87 yr old male with chronic cognitive complaints. Now with diagnosis of vascular dementia, requiring supervision of medications and nutritional support,  last seen by over one year ago in a virtual visit.  Patient is not competent to live independently and has been living with son for over one year.  Multiple children are involved in his care and are supportive of his son [name] becoming his g POA.  Jeffery Reynolds appears well but is less articulate than on prior evaluations  .  His son is concerned about the cause of recurrent hallucinations he has been having after waking up. He has  hsitroy of severe OSA and does not wear CPAP on a regular basis .  The hallucinations are described as a "blurring between reality and dreams"  by his son. He has not been agitated or in distress. He has not had  fevers, diarrhea, weight loss, or wandered out of the home.   REcently he has been having rhinitis with clear discharge,  and irritated itchy eyes.  COVID test was set up for patient at clinic yesterday and results were not available at time of virtual visit   ROS: See pertinent positives and negatives per HPI.  Past Medical History:  Diagnosis Date   Arthritis    CAD (coronary artery disease)    a. s/p 6-V CABG in [place] in 1995; b. MV 4/12: mild intensity, fixed perfusion defect involving the basal to mid anterior wall consistent with injury to the diagonal distribution (this finding was also noted on prior study dating back to 2008). Overall, no ischemia was noted and there was no high-risk prognostic indicators based on this study. EF 63%   Carotid artery disease ([place])    a. ultrasound 12/16: 1-39% bilateral ICA stenosis   Chronic atrial fibrillation ([place]) 2000   a. CHADS2VASc => 4 (HTN, age x 2, vascular disease); b. on Coumadin managed by [facility] Clinic   Chronic back pain    Cognitive impairment    Colon polyps    Diastolic dysfunction    a. 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[place])     Past Surgical History:  Procedure Laterality Date   CARDIAC CATHETERIZATION     CORONARY ARTERY BYPASS GRAFT  1995   CABG x 6 in [place], [place] EXCISION     KNEE SURGERY     bilateral    ROTATOR CUFF REPAIR     right   TONSILLECTOMY AND ADENOIDECTOMY  1943    Family History  Problem Relation Age of Onset   Heart disease Father    Heart  attack Father 56       MI   Alcohol abuse Father    Hyperlipidemia Father    Hypertension Father    Heart attack Paternal Grandfather 58   Alcohol abuse Paternal Grandfather    Cancer Mother        Breast cancer and Lung Cancer   Diabetes Brother    Dementia Brother    Crohn's disease Daughter    Cancer Brother        lung cancer   Alcohol abuse Son    Alcohol abuse Maternal Grandfather    Alcohol abuse Son    Heart attack Paternal Grandmother 57    SOCIAL HX:  reports that he has never smoked. He has never used smokeless tobacco. He reports previous alcohol use. He reports that he does not use drugs.    Current Outpatient Medications:    allopurinol (ZYLOPRIM) 300 MG tablet, TAKE 1 TABLET ON MONDAY, WEDNESDAY AND FRIDAY, Disp: 12 tablet, Rfl: 2   Artificial Tear Ointment (DRY EYES OP), Apply 1 drop 3 (three) times daily to eye., Disp: , Rfl:    atorvastatin (LIPITOR) 40 MG tablet, TAKE 1 TABLET BY MOUTH EVERY DAY, Disp: 90 tablet, Rfl: 1   cyanocobalamin 1000 MCG tablet, Take by mouth., Disp: , Rfl:    furosemide (LASIX) 20 MG tablet, TAKE 1 TABLET BY MOUTH EVERY OTHER DAY FOR FLUID RETENTION, Disp: 45 tablet, Rfl: 2   metoprolol tartrate (LOPRESSOR) 50 MG tablet, TAKE 1 TABLET(50 MG) BY MOUTH TWICE DAILY, Disp: 180 tablet, Rfl: 1   Pedialyte (PEDIALYTE) SOLN, Take 240 mLs by mouth., Disp: , Rfl:    warfarin (COUMADIN) 5 MG tablet, TAKE 1 TABLET BY MOUTH DAILY AT 6 PM. TAKE 1 TABLET BY MOUTH ALL DAYS EXCEPT FOR MONDAY AND FRIDAY. MONDAY AND FRIDAY PT TAKES 2.5 MG., Disp: 90 tablet, Rfl: 1  EXAM:  VITALS per patient if applicable:  GENERAL: alert, oriented, appears well and in no acute distress  HEENT: atraumatic, conjunttiva clear, no obvious abnormalities on inspection of external nose and ears  NECK: normal movements of the head and neck  LUNGS: on inspection no signs of respiratory distress, breathing rate appears normal, no obvious gross SOB, gasping or wheezing  CV:  no obvious cyanosis  MS: moves all visible extremities without noticeable abnormality  PSYCH/NEURO: pleasant and cooperative, no obvious depression or anxiety, speech and thought processing grossly intact  ASSESSMENT AND PLAN:  Discussed the following assessment and plan:  Vascular dementia without behavioral disturbance (HCC)  Seasonal allergies  Dementia without behavioral disturbance ([place]) Confirmed by Dr Melrose Nakayama.  Agree that patient is no longer competent to manage his medications or financial affairs  Son Kemper Humm is obtaining POA.  Recommend adding seroquel if needed for agitation. Recurrent hallucinations  may be due to untreated OSA   Seasonal allergies Suggested by symptoms,  With COVID  PCR negative.  Recommend avoiding benadryl due to side effects and using clariitin     I discussed the assessment and treatment plan with the patient. The patient was provided an opportunity to ask questions and all were answered. The patient agreed with the plan and demonstrated an understanding of the instructions.   The patient was advised to call back or seek an in-person evaluation if the symptoms worsen or if the condition fails to improve as anticipated.   I spent 30 minutes dedicated to the care of this patient on the date of this encounter to include pre-visit review of his medical history,  Face-to-face time with the patient , and post visit ordering of testing and therapeutics.    Crecencio Mc, MD

## 2021-03-29 NOTE — Patient Instructions (Signed)
Use claritin for the allergy symptoms;  add robitussin if needed for cough  I recommend continuing to use melatonin for your insomnia., but remember that It is not a sedative, and  must be taken on  a regular basis to help your internal clock.  Take every evening after dinner .  Do not wait until bedtime  The hallucinations (or the confusion about what is reality and what is a dream) may be due to low oxygen levels caused by inadequate CPAP   Confirm that Dr Melrose Nakayama has ordered a CPAP titration study;  if he has not,  I will.  I can add Seroquel in the evening to help reduce the hallucinations

## 2021-03-29 NOTE — Assessment & Plan Note (Addendum)
Confirmed by Dr Melrose Nakayama.  Agree that patient is no longer competent to manage his medications or financial affairs  Son Bonifacio Wolner is obtaining POA.  Recommend adding seroquel if needed for agitation. Recurrent hallucinations  may be due to untreated OSA

## 2021-04-01 NOTE — Assessment & Plan Note (Signed)
Suggested by symptoms,  With COVID PCR negative.  Recommend avoiding benadryl due to side effects and using clariitin

## 2021-04-04 ENCOUNTER — Telehealth: Payer: Self-pay

## 2021-04-04 NOTE — Telephone Encounter (Signed)
LMTCB in regards to lab results.  

## 2021-04-08 ENCOUNTER — Ambulatory Visit (INDEPENDENT_AMBULATORY_CARE_PROVIDER_SITE_OTHER): Payer: Medicare Other | Admitting: *Deleted

## 2021-04-08 DIAGNOSIS — F0150 Vascular dementia without behavioral disturbance: Secondary | ICD-10-CM | POA: Diagnosis not present

## 2021-04-08 DIAGNOSIS — G4733 Obstructive sleep apnea (adult) (pediatric): Secondary | ICD-10-CM

## 2021-04-08 NOTE — Chronic Care Management (AMB) (Addendum)
  Care Management   Follow Up Note   04/08/2021 Name: Jeffery Reynolds MRN: [id number] DOB: 07/24/1934   Referred by: Crecencio Mc, MD Reason for referral : Case Closure   Successful contact was made with the patient's son, Jeffery Reynolds to discuss care management and care coordination services. RNCM introduced self and role and other CCM services.  Patient attended virtual appointment with PCP on 03/29/2021.  Son declines engagement with CCM at this time.   Follow Up Plan: No further follow up required: as son declines CCM services at this time. Encouraged son to contact CCM in the future if needs arise.  [name] RN, MSN RN Care Management Coordinator [place] [phone] [email or web address]

## 2021-04-08 NOTE — Patient Instructions (Signed)
Visit Information  Thank you for allowing me to share the care management and care coordination services that are available to you as part of your health plan and services through your primary care provider and medical home. Please reach out to me at [phone] if the care management/care coordination team may be of assistance to you in the future.   [name] RN, MSN RN Care Management Coordinator [place] [phone] [email or web address]

## 2021-04-10 ENCOUNTER — Ambulatory Visit (INDEPENDENT_AMBULATORY_CARE_PROVIDER_SITE_OTHER): Payer: Medicare Other

## 2021-04-10 ENCOUNTER — Other Ambulatory Visit: Payer: Self-pay

## 2021-04-10 DIAGNOSIS — Z5181 Encounter for therapeutic drug level monitoring: Secondary | ICD-10-CM

## 2021-04-10 DIAGNOSIS — I4819 Other persistent atrial fibrillation: Secondary | ICD-10-CM | POA: Diagnosis not present

## 2021-04-10 LAB — POCT INR: INR: 3.8 — AB (ref 2.0–3.0)

## 2021-04-10 NOTE — Patient Instructions (Signed)
-   skip warfarin tonight, then  - continue of warfarin of 1 tablet every day Please be consistent w/ your greens intake - pick a day each week to have your greens and have them every week on that same day.  - Recheck in 4 weeks.

## 2021-04-15 ENCOUNTER — Other Ambulatory Visit: Payer: Self-pay | Admitting: Internal Medicine

## 2021-04-23 ENCOUNTER — Ambulatory Visit: Payer: Medicare Other | Admitting: *Deleted

## 2021-05-02 ENCOUNTER — Other Ambulatory Visit: Payer: Self-pay

## 2021-05-02 ENCOUNTER — Ambulatory Visit (INDEPENDENT_AMBULATORY_CARE_PROVIDER_SITE_OTHER): Payer: Medicare Other | Admitting: Cardiovascular Disease

## 2021-05-02 ENCOUNTER — Encounter: Payer: Self-pay | Admitting: Cardiovascular Disease

## 2021-05-02 VITALS — BP 90/64 | HR 66 | Ht 68.0 in | Wt 196.0 lb

## 2021-05-02 DIAGNOSIS — I2510 Atherosclerotic heart disease of native coronary artery without angina pectoris: Secondary | ICD-10-CM | POA: Diagnosis not present

## 2021-05-02 DIAGNOSIS — E785 Hyperlipidemia, unspecified: Secondary | ICD-10-CM

## 2021-05-02 DIAGNOSIS — D696 Thrombocytopenia, unspecified: Secondary | ICD-10-CM

## 2021-05-02 DIAGNOSIS — I10 Essential (primary) hypertension: Secondary | ICD-10-CM

## 2021-05-02 DIAGNOSIS — I482 Chronic atrial fibrillation, unspecified: Secondary | ICD-10-CM | POA: Diagnosis not present

## 2021-05-02 LAB — POCT INR: INR: 3.5 — AB (ref 2.0–3.0)

## 2021-05-02 NOTE — Progress Notes (Signed)
Cardiology Office Note   Date:  05/02/2021   ID:  Jeffery Reynolds, DOB 1934/03/26, MRN [id number]  PCP:  Crecencio Mc, MD  Cardiologist:   Kathlyn Sacramento, MD   Chief Complaint  Patient presents with   Other    OD 1 yr f/u c/o back pain. Meds reviewed verbally with pt.      History of Present Illness: Jeffery Reynolds is a 87 y.o. male who is here today for a follow-up visit regarding coronary artery disease and chronic atrial fibrillation. He is status post 6 vessel CABG in 1995 in [place]. He is being treated with rate control and anticoagulation for atrial fibrillation. He has been doing well with no recent chest pain, shortness of breath or palpitations.  He takes his medications regularly.  He has early memory decline and currently takes small dose Aricept.  His blood pressure is on the low side but he denies dizziness or syncope.  Past Medical History:  Diagnosis Date   Arthritis    CAD (coronary artery disease)    a. s/p 6-V CABG in [place] in 1995; b. MV 4/12: mild intensity, fixed perfusion defect involving the basal to mid anterior wall consistent with injury to the diagonal distribution (this finding was also noted on prior study dating back to 2008). Overall, no ischemia was noted and there was no high-risk prognostic indicators based on this study. EF 63%   Carotid artery disease ([place])    a. ultrasound 12/16: 1-39% bilateral ICA stenosis   Chronic atrial fibrillation ([place]) 2000   a. CHADS2VASc => 4 (HTN, age x 2, vascular disease); b. on Coumadin managed by [facility] Clinic   Chronic back pain    Cognitive impairment    Colon polyps    Diastolic dysfunction    a. 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     furosemide (LASIX) 20 MG tablet TAKE 1 TABLET BY MOUTH EVERY OTHER DAY FOR FLUID RETENTION 45 tablet 2   metoprolol tartrate (LOPRESSOR) 50 MG tablet TAKE 1 TABLET(50 MG) BY MOUTH TWICE DAILY 180 tablet 1   warfarin (COUMADIN) 5 MG tablet TAKE 1 TABLET BY MOUTH DAILY AT 6 PM. TAKE 1 TABLET BY MOUTH ALL DAYS EXCEPT FOR MONDAY AND FRIDAY. [place]  PT TAKES 2.5 MG. 90 tablet 1   Artificial Tear Ointment (DRY EYES OP) Apply 1 drop 3 (three) times daily to eye. (Patient not taking: Reported on 05/02/2021)     cyanocobalamin 1000 MCG tablet Take by mouth. (Patient not taking: Reported on 05/02/2021)     Pedialyte (PEDIALYTE) SOLN Take 240 mLs by mouth. (Patient not taking: Reported on 05/02/2021)     No current facility-administered medications for this visit.    Allergies:   Patient has no known allergies.    Social History:  The patient  reports that he has never smoked. He has never used smokeless tobacco. He reports that he does not currently use alcohol. He reports that he does not use drugs.   Family History:  The patient's family history includes Alcohol abuse in his father, maternal grandfather, paternal  grandfather, son, and son; Cancer in his brother and mother; Crohn's disease in his daughter; Dementia in his brother; Diabetes in his brother; Heart attack (age of onset: 35) in his father; Heart attack (age of onset: 46) in his paternal grandfather; Heart attack (age of onset: 54) in his paternal grandmother; Heart disease in his father; Hyperlipidemia in his father; Hypertension in his father.    ROS:  Please see the history of present illness.   Otherwise, review of systems are positive for none.   All other systems are reviewed and negative.    PHYSICAL EXAM: VS:  BP 90/64 (BP Location: Left Arm, Patient Position: Sitting, Cuff Size: Normal)   Pulse 66   Ht '5\' 8"'$  (1.727 m)   Wt 196 lb (88.9 kg)   SpO2 96%   BMI 29.80 kg/m  , BMI Body mass index is 29.8 kg/m. GEN: Well nourished, well developed, in no acute distress  HEENT: normal  Neck: no JVD, carotid bruits, or masses Cardiac: Irregularly irregular; no murmurs, rubs, or gallops,no edema  Respiratory:  clear to auscultation bilaterally, normal work of breathing GI: soft, nontender, nondistended, + BS MS: no deformity or atrophy  Skin: warm and dry, no rash Neuro:  Strength and sensation are intact Psych: euthymic mood, full affect   EKG:  EKG is ordered today. The ekg ordered today demonstrates atrial fibrillation with right bundle branch block.  Recent Labs: No results found for requested labs within last 8760 hours.    Lipid Panel    Component Value Date/Time   CHOL 128 03/27/2020 1200   TRIG 160 (H) 03/27/2020 1200   HDL 41 03/27/2020 1200   CHOLHDL 3.1 03/27/2020 1200   CHOLHDL 4 04/07/2019 0903   VLDL 41.2 (H) 04/07/2019 0903   LDLCALC 60 03/27/2020 1200   LDLDIRECT 65.0 04/07/2019 0903      Wt Readings from Last 3 Encounters:  05/02/21 196 lb (88.9 kg)  03/29/21 205 lb (93 kg)  09/27/20 205 lb (93 kg)      No flowsheet data found.    ASSESSMENT AND PLAN:  1.  Chronic atrial fibrillation:  He is  doing well overall with reasonably controlled ventricular rate. He appears to be asymptomatic. He is tolerating anticoagulation with warfarin.  He has known history of thrombocytopenia with most recent platelet count of 90,000.  I requested repeat CBC. Check INR today given the his most recent INR was mildly elevated.  2. Coronary artery disease involving native coronary arteries without angina: Continue medical therapy.  3. Essential hypertension: His blood pressure is low today but he is asymptomatic.  Continue to monitor and we can consider decreasing  the dose of metoprolol in the future if needed.  Check basic metabolic profile today.  4. Hyperlipidemia: Continue treatment with atorvastatin. Most recent LDL was 60.  I ordered a follow-up lipid and liver profile.   Disposition:   FU with me in 1 year  Signed,  Kathlyn Sacramento, MD  05/02/2021 1:48 PM    [place]

## 2021-05-02 NOTE — Patient Instructions (Signed)
Medication Instructions:  Your physician recommends that you continue on your current medications as directed. Please refer to the Current Medication list given to you today.  *If you need a refill on your cardiac medications before your next appointment, please call your pharmacy*   Lab Work: Cbc, Cmp, Lipid, INR today  If you have labs (blood work) drawn today and your tests are completely normal, you will receive your results only by: [place] (if you have MyChart) OR A paper copy in the mail If you have any lab test that is abnormal or we need to change your treatment, we will call you to review the results.   Testing/Procedures: None ordered   Follow-Up: At [facility], you and your health needs are our priority.  As part of our continuing mission to provide you with exceptional heart care, we have created designated Provider Care Teams.  These Care Teams include your primary Cardiologist (physician) and Advanced Practice Providers (APPs -  Physician Assistants and Nurse Practitioners) who all work together to provide you with the care you need, when you need it.  We recommend signing up for the patient portal called "MyChart".  Sign up information is provided on this After Visit Summary.  MyChart is used to connect with patients for Virtual Visits (Telemedicine).  Patients are able to view lab/test results, encounter notes, upcoming appointments, etc.  Non-urgent messages can be sent to your provider as well.   To learn more about what you can do with MyChart, go to [email or web address].    Your next appointment:   Your physician wants you to follow-up in: 1 year You will receive a reminder letter in the mail two months in advance. If you don't receive a letter, please call our office to schedule the follow-up appointment.   The format for your next appointment:   In Person  Provider:   You may see Kathlyn Sacramento, MD or one of the following Advanced Practice  Providers on your designated Care Team:   Murray Hodgkins, NP Christell Faith, PA-C Marrianne Mood, PA-C Cadence Kathlen Mody, [place]   Other Instructions N/A

## 2021-05-03 ENCOUNTER — Telehealth: Payer: Self-pay

## 2021-05-03 ENCOUNTER — Ambulatory Visit (INDEPENDENT_AMBULATORY_CARE_PROVIDER_SITE_OTHER): Payer: Medicare Other | Admitting: Cardiovascular Disease

## 2021-05-03 DIAGNOSIS — Z5181 Encounter for therapeutic drug level monitoring: Secondary | ICD-10-CM

## 2021-05-03 DIAGNOSIS — D696 Thrombocytopenia, unspecified: Secondary | ICD-10-CM

## 2021-05-03 LAB — LIPID PANEL
Chol/HDL Ratio: 2.9 ratio (ref 0.0–5.0)
Cholesterol, Total: 120 mg/dL (ref 100–199)
HDL: 42 mg/dL (ref >39)
LDL Chol Calc (NIH): 51 mg/dL (ref 0–99)
Triglycerides: 156 mg/dL — ABNORMAL HIGH (ref 0–149)
VLDL Cholesterol Cal: 27 mg/dL (ref 5–40)

## 2021-05-03 LAB — CBC WITH DIFFERENTIAL/PLATELET
Basophils Absolute: 0 10*3/uL (ref 0.0–0.2)
Basos: 0 %
EOS (ABSOLUTE): 0.1 10*3/uL (ref 0.0–0.4)
Eos: 2 %
Hematocrit: 42.5 % (ref 37.5–51.0)
Hemoglobin: 14.3 g/dL (ref 13.0–17.7)
Immature Grans (Abs): 0 10*3/uL (ref 0.0–0.1)
Immature Granulocytes: 1 %
Lymphocytes Absolute: 0.8 10*3/uL (ref 0.7–3.1)
Lymphs: 19 %
MCH: 32.9 pg (ref 26.6–33.0)
MCHC: 33.6 g/dL (ref 31.5–35.7)
MCV: 98 fL — ABNORMAL HIGH (ref 79–97)
Monocytes Absolute: 0.4 10*3/uL (ref 0.1–0.9)
Monocytes: 10 %
Neutrophils Absolute: 2.7 10*3/uL (ref 1.4–7.0)
Neutrophils: 68 %
Platelets: 82 10*3/uL — CL (ref 150–450)
RBC: 4.35 x10E6/uL (ref 4.14–5.80)
RDW: 12.9 % (ref 11.6–15.4)
WBC: 4 10*3/uL (ref 3.4–10.8)

## 2021-05-03 LAB — COMPREHENSIVE METABOLIC PANEL
ALT: 33 IU/L (ref 0–44)
AST: 22 IU/L (ref 0–40)
Albumin/Globulin Ratio: 2.4 — ABNORMAL HIGH (ref 1.2–2.2)
Albumin: 4.4 g/dL (ref 3.6–4.6)
Alkaline Phosphatase: 119 IU/L (ref 44–121)
BUN/Creatinine Ratio: 14 (ref 10–24)
BUN: 15 mg/dL (ref 8–27)
Bilirubin Total: 1.1 mg/dL (ref 0.0–1.2)
CO2: 26 mmol/L (ref 20–29)
Calcium: 9.6 mg/dL (ref 8.6–10.2)
Chloride: 104 mmol/L (ref 96–106)
Creatinine, Ser: 1.04 mg/dL (ref 0.76–1.27)
Globulin, Total: 1.8 g/dL (ref 1.5–4.5)
Glucose: 150 mg/dL — ABNORMAL HIGH (ref 65–99)
Potassium: 4.1 mmol/L (ref 3.5–5.2)
Sodium: 143 mmol/L (ref 134–144)
Total Protein: 6.2 g/dL (ref 6.0–8.5)
eGFR: 70 mL/min/{1.73_m2} (ref >59)

## 2021-05-03 LAB — PROTIME-INR
INR: 3.5 — ABNORMAL HIGH (ref 0.9–1.2)
Prothrombin Time: 34.6 s — ABNORMAL HIGH (ref 9.1–12.0)

## 2021-05-03 NOTE — Telephone Encounter (Signed)
Called the patient to review results. Jeffery Reynolds  identified himself as the patients son answered the phone and stated the patient is in the next room. He requested I review the results with him. I did not see his name listed in the DPR. Patient gave verbal permission for me to discuss his results with Jeffery Reynolds.  Jeffery Reynolds made aware of lab results and Dr. Tyrell Antonio recommendation. Adv him that the referral will be placed to Hematology and that the [facility] will call them to schedule the appt. Jeffery Reynolds stated that our coumadin clinic has contacted them with the patients warfarin instructions.  Jeffery Reynolds verbalized understanding to the info given and voiced appreciation for the call.

## 2021-05-03 NOTE — Addendum Note (Signed)
Addended by: Crecencio Mc on: 05/03/2021 03:14 PM   Modules accepted: Orders

## 2021-05-03 NOTE — Patient Instructions (Signed)
Description   Hold warfarin tomorrow then start taking warfarin 1 tablet daily except for 1/2 a tablet on Wednesday. Recheck INR in 1 week coumadin Clinic [phone]

## 2021-05-03 NOTE — Telephone Encounter (Signed)
-----   Message from Wellington Hampshire, MD sent at 05/03/2021  2:15 PM EDT ----- Inform patient that labs showed further decrease in his platelet count.  Recommend referral to hematology for evaluation. The rest of his labs were unremarkable. His INR was mildly elevated at 3.5. I included Mandi on this regarding his warfarin.

## 2021-05-06 NOTE — Telephone Encounter (Signed)
Returned the call to the patients son Jeffery Reynolds. Adv him that the patients is being referred to Hematology for Thrombocytopenia.  Jeffery Reynolds sts that this has been a chronic issue for many years and never before required the referral. He would prefer for the patient not to have another physician added to his HCP panel if not truly needed.  Tarri Glenn that the recent value is the lowest I can see that its been for atleast a year.  He would prefer to defer to Dr. Derrel Nip for management. Shawn sts that he will contact the patients pcp Dr. Derrel Nip to discuss further.  Shawn voiced appreciation for the call back.

## 2021-05-06 NOTE — Telephone Encounter (Signed)
Patient son Raquel Sarna calling Would like to clarify what the hematology referral is for States patient has always had a low platelet count and would like to know why referred now if this is reason  Please call to discuss

## 2021-05-08 ENCOUNTER — Other Ambulatory Visit: Payer: Self-pay

## 2021-05-08 ENCOUNTER — Ambulatory Visit (INDEPENDENT_AMBULATORY_CARE_PROVIDER_SITE_OTHER): Payer: Medicare Other

## 2021-05-08 DIAGNOSIS — Z5181 Encounter for therapeutic drug level monitoring: Secondary | ICD-10-CM | POA: Diagnosis not present

## 2021-05-08 DIAGNOSIS — I4819 Other persistent atrial fibrillation: Secondary | ICD-10-CM

## 2021-05-08 LAB — POCT INR: INR: 2.3 (ref 2.0–3.0)

## 2021-05-08 NOTE — Patient Instructions (Signed)
-   continue taking warfarin 1 tablet daily except for 1/2 a tablet on Wednesday.  - Recheck INR in 2 weeks

## 2021-05-10 ENCOUNTER — Inpatient Hospital Stay: Payer: Medicare Other | Attending: Oncology | Admitting: Oncology

## 2021-05-10 ENCOUNTER — Inpatient Hospital Stay: Payer: Medicare Other

## 2021-05-22 ENCOUNTER — Other Ambulatory Visit: Payer: Self-pay

## 2021-05-22 ENCOUNTER — Ambulatory Visit (INDEPENDENT_AMBULATORY_CARE_PROVIDER_SITE_OTHER): Payer: Medicare Other

## 2021-05-22 DIAGNOSIS — I4819 Other persistent atrial fibrillation: Secondary | ICD-10-CM | POA: Diagnosis not present

## 2021-05-22 DIAGNOSIS — Z5181 Encounter for therapeutic drug level monitoring: Secondary | ICD-10-CM

## 2021-05-22 LAB — POCT INR: INR: 3.1 — AB (ref 2.0–3.0)

## 2021-05-22 NOTE — Patient Instructions (Signed)
-   skip warfarin tonight, then  - continue taking warfarin 1 tablet daily except for 1/2 a tablet on WEDNESDAYS - Recheck INR in 3 weeks

## 2021-05-23 ENCOUNTER — Telehealth: Payer: Self-pay | Admitting: Internal Medicine

## 2021-05-23 NOTE — Telephone Encounter (Signed)
Patient informed, Due to the high volume of calls and your symptoms we have to forward your call to our Triage Nurse to expedient your call. Please hold for the transfer.  Patient transferred to Access Nurse. Due to having a headache and sharp pains in his head last night after talking with a nurse and his son is unsure what to do.No openings in office or virtual.

## 2021-05-23 NOTE — Telephone Encounter (Signed)
Access nurse instructed patient on home care and informed patient to go be evaluated due to no availability in office today.; patient stated he is going to go to the ED.

## 2021-05-24 ENCOUNTER — Ambulatory Visit
Admission: RE | Admit: 2021-05-24 | Discharge: 2021-05-24 | Disposition: A | Discharge status: Home / Self Care | Payer: Medicare Other | Source: Ambulatory Visit | Attending: Emergency Medicine | Admitting: Emergency Medicine

## 2021-05-24 ENCOUNTER — Other Ambulatory Visit: Payer: Self-pay | Admitting: Emergency Medicine

## 2021-05-24 ENCOUNTER — Other Ambulatory Visit: Payer: Self-pay

## 2021-05-24 ENCOUNTER — Other Ambulatory Visit (HOSPITAL_COMMUNITY): Payer: Self-pay | Admitting: Emergency Medicine

## 2021-05-24 DIAGNOSIS — R519 Headache, unspecified: Secondary | ICD-10-CM | POA: Insufficient documentation

## 2021-06-07 ENCOUNTER — Other Ambulatory Visit: Payer: Self-pay | Admitting: Internal Medicine

## 2021-06-12 ENCOUNTER — Other Ambulatory Visit: Payer: Self-pay

## 2021-06-12 ENCOUNTER — Ambulatory Visit (INDEPENDENT_AMBULATORY_CARE_PROVIDER_SITE_OTHER): Payer: Medicare Other

## 2021-06-12 DIAGNOSIS — Z5181 Encounter for therapeutic drug level monitoring: Secondary | ICD-10-CM | POA: Diagnosis not present

## 2021-06-12 DIAGNOSIS — I4819 Other persistent atrial fibrillation: Secondary | ICD-10-CM | POA: Diagnosis not present

## 2021-06-12 LAB — POCT INR: INR: 2.1 (ref 2.0–3.0)

## 2021-06-12 NOTE — Patient Instructions (Signed)
-   continue taking warfarin 1 tablet daily except for 1/2 a tablet on WEDNESDAYS - Recheck INR in 4 weeks

## 2021-06-17 DIAGNOSIS — H25813 Combined forms of age-related cataract, bilateral: Secondary | ICD-10-CM | POA: Diagnosis not present

## 2021-07-04 DIAGNOSIS — H25812 Combined forms of age-related cataract, left eye: Secondary | ICD-10-CM | POA: Diagnosis not present

## 2021-07-04 DIAGNOSIS — H25811 Combined forms of age-related cataract, right eye: Secondary | ICD-10-CM | POA: Diagnosis not present

## 2021-07-04 DIAGNOSIS — Z01818 Encounter for other preprocedural examination: Secondary | ICD-10-CM | POA: Diagnosis not present

## 2021-07-10 ENCOUNTER — Ambulatory Visit (INDEPENDENT_AMBULATORY_CARE_PROVIDER_SITE_OTHER): Payer: Medicare Other

## 2021-07-10 ENCOUNTER — Other Ambulatory Visit: Payer: Self-pay

## 2021-07-10 DIAGNOSIS — Z5181 Encounter for therapeutic drug level monitoring: Secondary | ICD-10-CM

## 2021-07-10 DIAGNOSIS — I4819 Other persistent atrial fibrillation: Secondary | ICD-10-CM | POA: Diagnosis not present

## 2021-07-10 LAB — POCT INR: INR: 2.4 (ref 2.0–3.0)

## 2021-07-10 NOTE — Patient Instructions (Signed)
-   continue taking warfarin 1 tablet daily except for 1/2 a tablet on WEDNESDAYS - Recheck INR in 5 weeks

## 2021-07-29 DIAGNOSIS — H2512 Age-related nuclear cataract, left eye: Secondary | ICD-10-CM | POA: Diagnosis not present

## 2021-07-29 DIAGNOSIS — H25812 Combined forms of age-related cataract, left eye: Secondary | ICD-10-CM | POA: Diagnosis not present

## 2021-08-10 ENCOUNTER — Encounter: Payer: Self-pay | Admitting: Internal Medicine

## 2021-08-12 ENCOUNTER — Telehealth: Payer: Self-pay

## 2021-08-12 NOTE — Telephone Encounter (Signed)
Dr. Derrel Nip, my father is struggling with mobility due to back and knee pain. Would it be possible to have an in home assessment? Going to a facility for PT would be difficult because of his IBS. Diapers have been beneficial but he resists wearing them. I hate that he is in so much pain and Im not sure how to best help him. Thank you for your help.

## 2021-08-13 NOTE — Telephone Encounter (Signed)
Spoke with pt's son and he would like to see if some in home PT can be ordered due to pt having increased mobility issues because of the increased back and knee pain. Son stated that he does not want pt on pain medications because of previous experiences when the pt would have hallucinations. Son stated that it is really hard for pt to get out of  the house for long periods of time due to pt having trouble with bowel and bladder incontinence. Son scheduled an appt with you to discuss with you on 09/06/2021.

## 2021-08-14 ENCOUNTER — Other Ambulatory Visit: Payer: Self-pay

## 2021-08-14 ENCOUNTER — Ambulatory Visit (INDEPENDENT_AMBULATORY_CARE_PROVIDER_SITE_OTHER): Payer: Medicare Other | Admitting: *Deleted

## 2021-08-14 DIAGNOSIS — Z5181 Encounter for therapeutic drug level monitoring: Secondary | ICD-10-CM

## 2021-08-14 DIAGNOSIS — I4819 Other persistent atrial fibrillation: Secondary | ICD-10-CM | POA: Diagnosis not present

## 2021-08-14 LAB — POCT INR: INR: 2.5 (ref 2.0–3.0)

## 2021-08-14 NOTE — Patient Instructions (Signed)
Description   - continue taking warfarin 1 tablet daily except for 1/2 a tablet on WEDNESDAYS - Recheck INR in 6 weeks

## 2021-08-22 ENCOUNTER — Telehealth: Payer: Self-pay

## 2021-08-22 NOTE — Telephone Encounter (Signed)
Patient son calling  States patient was to take 1/2 tablet of coumadin on Wednesday but he accidentally gave him a whole Is it ok to give a 1/2 tablet today Please call to clarify

## 2021-08-22 NOTE — Telephone Encounter (Signed)
Please call [phone]

## 2021-08-22 NOTE — Telephone Encounter (Addendum)
Since the message was taken and states not son Jeffery Reynolds and no one else is listed and no return number or the number they called on when I returned the call so returned a call to the pt the only number listed and had to leave a message. Will await a call back.   Minutes after my call was placed another message shows up with a number to call so called it and spoke with [place]. [name] is not on the DPR and he states he is fixing the meds and comes to appt with pt. Advised he needs to be added to [facility] and can do so on the next vist. Also, he will ensure pt takes 1/2 tablet today since he took 1 tablet last night then continue on regular dose-he confirmed instructions.

## 2021-09-02 DIAGNOSIS — H2511 Age-related nuclear cataract, right eye: Secondary | ICD-10-CM | POA: Diagnosis not present

## 2021-09-02 DIAGNOSIS — H25811 Combined forms of age-related cataract, right eye: Secondary | ICD-10-CM | POA: Diagnosis not present

## 2021-09-04 ENCOUNTER — Other Ambulatory Visit: Payer: Self-pay | Admitting: Internal Medicine

## 2021-09-06 ENCOUNTER — Ambulatory Visit: Payer: Medicare Other | Admitting: Internal Medicine

## 2021-09-13 ENCOUNTER — Ambulatory Visit (INDEPENDENT_AMBULATORY_CARE_PROVIDER_SITE_OTHER): Payer: Medicare Other | Admitting: Nurse Practitioner

## 2021-09-13 ENCOUNTER — Other Ambulatory Visit: Payer: Self-pay

## 2021-09-13 VITALS — BP 108/58 | HR 77 | Temp 98.0°F | Resp 10 | Ht 68.0 in | Wt 190.0 lb

## 2021-09-13 DIAGNOSIS — M545 Low back pain, unspecified: Secondary | ICD-10-CM | POA: Diagnosis not present

## 2021-09-13 DIAGNOSIS — R197 Diarrhea, unspecified: Secondary | ICD-10-CM

## 2021-09-13 DIAGNOSIS — Z8619 Personal history of other infectious and parasitic diseases: Secondary | ICD-10-CM

## 2021-09-13 DIAGNOSIS — R82998 Other abnormal findings in urine: Secondary | ICD-10-CM | POA: Diagnosis not present

## 2021-09-13 LAB — BASIC METABOLIC PANEL
BUN: 16 mg/dL (ref 6–23)
CO2: 30 mEq/L (ref 19–32)
Calcium: 9.2 mg/dL (ref 8.4–10.5)
Chloride: 105 mEq/L (ref 96–112)
Creatinine, Ser: 1.22 mg/dL (ref 0.40–1.50)
GFR: 53.86 mL/min — ABNORMAL LOW (ref >60.00)
Glucose, Bld: 93 mg/dL (ref 70–99)
Potassium: 3.9 mEq/L (ref 3.5–5.1)
Sodium: 140 mEq/L (ref 135–145)

## 2021-09-13 LAB — POCT URINALYSIS DIP (CLINITEK)
Bilirubin, UA: NEGATIVE
Blood, UA: NEGATIVE
Glucose, UA: NEGATIVE mg/dL
Ketones, POC UA: NEGATIVE mg/dL
Nitrite, UA: NEGATIVE
POC PROTEIN,UA: NEGATIVE
Spec Grav, UA: 1.025 (ref 1.010–1.025)
Urobilinogen, UA: 0.2 E.U./dL
pH, UA: 5.5 (ref 5.0–8.0)

## 2021-09-13 LAB — CBC
HCT: 43.1 % (ref 39.0–52.0)
Hemoglobin: 14.3 g/dL (ref 13.0–17.0)
MCHC: 33.1 g/dL (ref 30.0–36.0)
MCV: 96.2 fl (ref 78.0–100.0)
Platelets: 82 10*3/uL — ABNORMAL LOW (ref 150.0–400.0)
RBC: 4.48 Mil/uL (ref 4.22–5.81)
RDW: 14.5 % (ref 11.5–15.5)
WBC: 3.8 10*3/uL — ABNORMAL LOW (ref 4.0–10.5)

## 2021-09-13 NOTE — Assessment & Plan Note (Signed)
Patient with history of IBS.  States this is different has history of parasite infection that required treatment.  We will send off stool studies inclusive of ova and parasites.  Continue to monitor also check labs in office to make sure electrolytes are within normal limits.

## 2021-09-13 NOTE — Patient Instructions (Signed)
Nice to see you all today I will be in touch with the labs and stool studies once they are resulted Follow up if symptoms fail to improve or worsen

## 2021-09-13 NOTE — Assessment & Plan Note (Signed)
Longstanding history of the same.  Patient was going to the gym but had to stop due to increasing falls.  States he does sit around a lot at home but has complained that his back is getting in the way of some mobility and affecting quality life.  Patient would like to do physical therapy at home.  He does have a cane history of falls and having bowel issues which caused him to have frequent "accidents".  We will place a referral for in-home PT if patient qualifies.

## 2021-09-13 NOTE — Assessment & Plan Note (Signed)
UA mostly okay in office with a trace of leukocytes.  We will send off for urine culture.  Patient having symptoms unlikely infected.  Did encourage oral hydration.

## 2021-09-13 NOTE — Progress Notes (Signed)
Acute Office Visit  Subjective:    Patient ID: Jeffery Reynolds, male    DOB: Feb 14, 1934, 88 y.o.   MRN: [id number]  Chief Complaint  Patient presents with   Diarrhea    For a while now, lately has been more explosive and uncontrolable and unexpected. He did get treated for parasites in his stool about a year ago.    Darker urine    The past week. No urinary pain or discomfort.   Back Pain    Would like to get in home PT to help with this and getting exercise. Has had diagnoses of arthritis in the past.      Patient is in today for Diarrhea  States that hx of IBS. Had a history of parasite that needed treatment. States that last time he had the explosive diarrhea it ended up being the parasite. Episodes of explosive diarrhea are  inconsistent. States no abdominal pain or recent abx use. Did mention that he has fecal urgency  Dark urine: over the past 1-1.5 weeks ago. States that he is not having any symptoms. Does note that when he drinks more fluid it will lighten in color. Son at bedside and states his father does not drink enough fluids  Back pain: For approx  left knee replaced twice needs right knee replaced. States he has back pain. States that over the past 6 months to a year it has increased. Does have a sedintary lifestyle  Was going to the gym but had gait issues to where he was falling a lot. He already walks with a cane. Patient is also having bowel issues currently and does not feel like he can leave the house due to having accidents Past Medical History:  Diagnosis Date   Arthritis    CAD (coronary artery disease)    a. s/p 6-V CABG in [place] in 1995; b. MV 4/12: mild intensity, fixed perfusion defect involving the basal to mid anterior wall consistent with injury to the diagonal distribution (this finding was also noted on prior study dating back to 2008). Overall, no ischemia was noted and there was no high-risk prognostic indicators based on this study. EF 63%    Carotid artery disease ([place])    a. ultrasound 12/16: 1-39% bilateral ICA stenosis   Chronic atrial fibrillation ([place]) 2000   a. CHADS2VASc => 4 (HTN, age x 2, vascular disease); b. on Coumadin [facility]   Chronic back pain    Cognitive impairment    Colon polyps    Diastolic dysfunction    a. 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[place])     Past Surgical History:  Procedure Laterality Date   CARDIAC CATHETERIZATION     CORONARY ARTERY BYPASS GRAFT  1995   CABG x 6 in [place], [place]     bilateral    ROTATOR CUFF REPAIR     right   TONSILLECTOMY AND ADENOIDECTOMY  1943    Family History  Problem Relation Age of Onset   Heart disease Father    Heart attack Father 19       MI   Alcohol abuse Father    Hyperlipidemia Father    Hypertension Father    Heart attack Paternal Grandfather 58   Alcohol abuse Paternal Grandfather    Cancer Mother        Breast cancer and Lung Cancer   Diabetes Brother    Dementia Brother    Crohn's disease Daughter    Cancer Brother        lung cancer   Alcohol abuse Son    Alcohol abuse Maternal Grandfather    Alcohol abuse Son    Heart attack Paternal Grandmother 77    Social History   Socioeconomic History   Marital status: Divorced    Spouse name: Not on file   Number of children: 7   Years of education: 12   Highest education level: Not on file  Occupational History   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 Bidinger grew up in [place], [place]. Most recently he was living in [place]. He moved to the [place] area since March 2015. Recently divorced and moved to this area to be close to one of his sons. He enjoys reading and has been going to the gym to exercise.      ives at home with his son, Ed. He has 4 sons. Yerachmiel Spinney is his POA.   He is able to complete his ADLs except needs some assist at intervals from Ed to put his shoes on. ED completes the iADLs like preparing food, cleaning the home, bills, errands and provides transportation to medical appointments. T   Social Determinants of Health   Financial Resource Strain: Low Risk    Difficulty of Paying Living Expenses: Not hard at all  Food Insecurity: No Food Insecurity   Worried About Charity fundraiser in the Last Year: Never true   Ran Out of Food in the Last Year: Never true  Transportation Needs: No Transportation Needs   Lack of Transportation (Medical): No   Lack of Transportation (Non-Medical): No  Physical Activity: Not on file  Stress: No Stress Concern Present   Feeling of Stress : Not at all  Social Connections: Moderately Integrated   Frequency of Communication with Friends and Family: Three times a week   Frequency of Social Gatherings with Friends and Family: Three times a week   Attends Religious Services: 1 to 4 times per year   Active Member of Clubs or Organizations: Yes   Attends Archivist Meetings: 1 to 4 times per year   Marital Status: Divorced  Human resources officer Violence: Not At Risk   Fear of Current or Ex-Partner: No   Emotionally Abused: No   Physically Abused: No   Sexually Abused: No    Outpatient Medications  Prior to Visit  Medication Sig Dispense Refill   allopurinol (ZYLOPRIM) 300 MG tablet TAKE 1 TABLET ON MONDAY, WEDNESDAY AND FRIDAY 12 tablet 2   Artificial Tear Ointment (DRY EYES OP) Apply 1 drop to eye  3 (three) times daily.     atorvastatin (LIPITOR) 40 MG tablet TAKE 1 TABLET BY MOUTH EVERY DAY 90 tablet 1   donepezil (ARICEPT) 5 MG tablet Take 2.5 mg by mouth at bedtime.     furosemide (LASIX) 20 MG tablet TAKE 1 TABLET BY MOUTH EVERY OTHER DAY FOR FLUID RETENTION 45 tablet 2   metoprolol tartrate (LOPRESSOR) 50 MG tablet TAKE 1 TABLET(50 MG) BY MOUTH TWICE DAILY 180 tablet 1   ofloxacin (OCUFLOX) 0.3 % ophthalmic solution Place 1 drop into the right eye 4 (four) times daily.     prednisoLONE acetate (PRED FORTE) 1 % ophthalmic suspension PLEASE SEE ATTACHED FOR DETAILED DIRECTIONS     warfarin (COUMADIN) 5 MG tablet TAKE 1 TABLET BY MOUTH DAILY AT 6 PM. TAKE 1 TABLET BY MOUTH ALL DAYS EXCEPT FOR MONDAY AND FRIDAY. MONDAY AND FRIDAY PT TAKES 2.5 MG. 90 tablet 1   cyanocobalamin 1000 MCG tablet Take by mouth. (Patient not taking: Reported on 05/02/2021)     Pedialyte (PEDIALYTE) SOLN Take 240 mLs by mouth. (Patient not taking: Reported on 05/02/2021)     No facility-administered medications prior to visit.    No Known Allergies  Review of Systems  Constitutional:  Negative for chills and fever.  Respiratory:  Negative for cough and shortness of breath.   Cardiovascular:  Negative for chest pain.  Gastrointestinal:  Positive for diarrhea. Negative for abdominal pain, blood in stool, nausea and vomiting.  Genitourinary:  Negative for difficulty urinating, dysuria and hematuria.       Fecal urgency   Musculoskeletal:  Positive for back pain.  Neurological:  Negative for weakness and numbness.      Objective:    Physical Exam Constitutional:      Appearance: Normal appearance.     Comments: Uses cane  Cardiovascular:     Rate and Rhythm: Normal rate and regular rhythm.     Heart  sounds: Normal heart sounds.  Pulmonary:     Effort: Pulmonary effort is normal.     Breath sounds: Normal breath sounds.  Abdominal:     General: Bowel sounds are normal. There is no distension.     Palpations: There is no mass.     Tenderness: There is no abdominal tenderness.  Musculoskeletal:       Arms:     Lumbar back: Negative right straight leg raise test and negative left straight leg raise test.  Neurological:     Mental Status: He is alert.     Deep Tendon Reflexes:     Reflex Scores:      Patellar reflexes are 2+ on the right side and 0 on the left side.    Comments: Bilateral lower extremity strength 5/5  Has several TKR on left side. Could not elicit reflex    BP (!) 108/58    Pulse 77    Temp 98 F (36.7 C)    Resp 10    Ht $R'5\' 8"'IR$  (1.727 m)    Wt 190 lb (86.2 kg)    SpO2 99%    BMI 28.89 kg/m  Wt Readings from Last 3 Encounters:  09/13/21 190 lb (86.2 kg)  05/02/21 196 lb (88.9 kg)  03/29/21 205 lb (93 kg)    Health Maintenance Due  Topic Date Due   Zoster Vaccines- Shingrix (1 of 2) Never done   COVID-19 Vaccine (3 - Pfizer risk series) 12/02/2019   INFLUENZA VACCINE  03/25/2021    There are no preventive  care reminders to display for this patient.   Lab Results  Component Value Date   TSH 2.43 09/11/2015   Lab Results  Component Value Date   WBC 4.0 05/02/2021   HGB 14.3 05/02/2021   HCT 42.5 05/02/2021   MCV 98 (H) 05/02/2021   PLT 82 (LL) 05/02/2021   Lab Results  Component Value Date   NA 143 05/02/2021   K 4.1 05/02/2021   CO2 26 05/02/2021   GLUCOSE 150 (H) 05/02/2021   BUN 15 05/02/2021   CREATININE 1.04 05/02/2021   BILITOT 1.1 05/02/2021   ALKPHOS 119 05/02/2021   AST 22 05/02/2021   ALT 33 05/02/2021   PROT 6.2 05/02/2021   ALBUMIN 4.4 05/02/2021   CALCIUM 9.6 05/02/2021   ANIONGAP 11 10/26/2019   EGFR 70 05/02/2021   GFR 53.54 (L) 09/05/2019   Lab Results  Component Value Date   CHOL 120 05/02/2021   Lab Results   Component Value Date   HDL 42 05/02/2021   Lab Results  Component Value Date   LDLCALC 51 05/02/2021   Lab Results  Component Value Date   TRIG 156 (H) 05/02/2021   Lab Results  Component Value Date   CHOLHDL 2.9 05/02/2021   Lab Results  Component Value Date   HGBA1C 5.4 09/05/2019       Assessment & Plan:   Problem List Items Addressed This Visit       Other   Diarrhea    Patient with history of IBS.  States this is different has history of parasite infection that required treatment.  We will send off stool studies inclusive of ova and parasites.  Continue to monitor also check labs in office to make sure electrolytes are within normal limits.      Relevant Orders   CBC (Completed)   Basic metabolic panel (Completed)   Ova and parasite examination   Gastrointestinal Pathogen Panel PCR   Dark urine - Primary    UA mostly okay in office with a trace of leukocytes.  We will send off for urine culture.  Patient having symptoms unlikely infected.  Did encourage oral hydration.      Relevant Orders   POCT URINALYSIS DIP (CLINITEK) (Completed)   Urine Culture   Lumbar back pain    Longstanding history of the same.  Patient was going to the gym but had to stop due to increasing falls.  States he does sit around a lot at home but has complained that his back is getting in the way of some mobility and affecting quality life.  Patient would like to do physical therapy at home.  He does have a cane history of falls and having bowel issues which caused him to have frequent "accidents".  We will place a referral for in-home PT if patient qualifies.      Relevant Orders   Ambulatory referral to [place]   History of intestinal parasite   Relevant Orders   Ova and parasite examination   Gastrointestinal Pathogen Panel PCR     No orders of the defined types were placed in this encounter.  This visit occurred during the SARS-CoV-2 public health emergency.  Safety  protocols were in place, including screening questions prior to the visit, additional usage of staff PPE, and extensive cleaning of exam room while observing appropriate contact time as indicated for disinfecting solutions.    Romilda Garret, NP

## 2021-09-14 LAB — URINE CULTURE
MICRO NUMBER:: 12898575
Result:: NO GROWTH
SPECIMEN QUALITY:: ADEQUATE

## 2021-09-16 DIAGNOSIS — R410 Disorientation, unspecified: Secondary | ICD-10-CM | POA: Diagnosis not present

## 2021-09-16 DIAGNOSIS — G479 Sleep disorder, unspecified: Secondary | ICD-10-CM | POA: Diagnosis not present

## 2021-09-16 DIAGNOSIS — R413 Other amnesia: Secondary | ICD-10-CM | POA: Diagnosis not present

## 2021-09-16 DIAGNOSIS — G4733 Obstructive sleep apnea (adult) (pediatric): Secondary | ICD-10-CM | POA: Diagnosis not present

## 2021-09-16 DIAGNOSIS — R441 Visual hallucinations: Secondary | ICD-10-CM | POA: Diagnosis not present

## 2021-09-22 ENCOUNTER — Other Ambulatory Visit: Payer: Self-pay | Admitting: Internal Medicine

## 2021-09-23 ENCOUNTER — Ambulatory Visit: Payer: Medicare Other | Admitting: Internal Medicine

## 2021-09-23 DIAGNOSIS — M109 Gout, unspecified: Secondary | ICD-10-CM | POA: Diagnosis not present

## 2021-09-23 DIAGNOSIS — Z951 Presence of aortocoronary bypass graft: Secondary | ICD-10-CM | POA: Diagnosis not present

## 2021-09-23 DIAGNOSIS — K219 Gastro-esophageal reflux disease without esophagitis: Secondary | ICD-10-CM | POA: Diagnosis not present

## 2021-09-23 DIAGNOSIS — M1990 Unspecified osteoarthritis, unspecified site: Secondary | ICD-10-CM | POA: Diagnosis not present

## 2021-09-23 DIAGNOSIS — Z7901 Long term (current) use of anticoagulants: Secondary | ICD-10-CM | POA: Diagnosis not present

## 2021-09-23 DIAGNOSIS — A630 Anogenital (venereal) warts: Secondary | ICD-10-CM | POA: Diagnosis not present

## 2021-09-23 DIAGNOSIS — E785 Hyperlipidemia, unspecified: Secondary | ICD-10-CM | POA: Diagnosis not present

## 2021-09-23 DIAGNOSIS — I6523 Occlusion and stenosis of bilateral carotid arteries: Secondary | ICD-10-CM | POA: Diagnosis not present

## 2021-09-23 DIAGNOSIS — I482 Chronic atrial fibrillation, unspecified: Secondary | ICD-10-CM | POA: Diagnosis not present

## 2021-09-23 DIAGNOSIS — M545 Low back pain, unspecified: Secondary | ICD-10-CM | POA: Diagnosis not present

## 2021-09-23 DIAGNOSIS — G8929 Other chronic pain: Secondary | ICD-10-CM | POA: Diagnosis not present

## 2021-09-23 DIAGNOSIS — Z9181 History of falling: Secondary | ICD-10-CM | POA: Diagnosis not present

## 2021-09-23 DIAGNOSIS — K635 Polyp of colon: Secondary | ICD-10-CM | POA: Diagnosis not present

## 2021-09-23 DIAGNOSIS — R4189 Other symptoms and signs involving cognitive functions and awareness: Secondary | ICD-10-CM | POA: Diagnosis not present

## 2021-09-23 DIAGNOSIS — D696 Thrombocytopenia, unspecified: Secondary | ICD-10-CM | POA: Diagnosis not present

## 2021-09-23 DIAGNOSIS — J302 Other seasonal allergic rhinitis: Secondary | ICD-10-CM | POA: Diagnosis not present

## 2021-09-23 DIAGNOSIS — G4733 Obstructive sleep apnea (adult) (pediatric): Secondary | ICD-10-CM | POA: Diagnosis not present

## 2021-09-23 DIAGNOSIS — K589 Irritable bowel syndrome without diarrhea: Secondary | ICD-10-CM | POA: Diagnosis not present

## 2021-09-23 DIAGNOSIS — I119 Hypertensive heart disease without heart failure: Secondary | ICD-10-CM | POA: Diagnosis not present

## 2021-09-23 DIAGNOSIS — I2510 Atherosclerotic heart disease of native coronary artery without angina pectoris: Secondary | ICD-10-CM | POA: Diagnosis not present

## 2021-09-23 DIAGNOSIS — Z96652 Presence of left artificial knee joint: Secondary | ICD-10-CM | POA: Diagnosis not present

## 2021-09-25 ENCOUNTER — Other Ambulatory Visit: Payer: Self-pay

## 2021-09-25 ENCOUNTER — Ambulatory Visit (INDEPENDENT_AMBULATORY_CARE_PROVIDER_SITE_OTHER): Payer: Medicare Other

## 2021-09-25 DIAGNOSIS — Z5181 Encounter for therapeutic drug level monitoring: Secondary | ICD-10-CM | POA: Diagnosis not present

## 2021-09-25 DIAGNOSIS — I4819 Other persistent atrial fibrillation: Secondary | ICD-10-CM

## 2021-09-25 LAB — POCT INR: INR: 2.5 (ref 2.0–3.0)

## 2021-09-25 NOTE — Patient Instructions (Signed)
-   continue taking warfarin 1 tablet daily except for 1/2 a tablet on WEDNESDAYS - Recheck INR in 6 weeks 

## 2021-09-30 ENCOUNTER — Ambulatory Visit (INDEPENDENT_AMBULATORY_CARE_PROVIDER_SITE_OTHER): Payer: Medicare Other

## 2021-09-30 VITALS — Ht 68.0 in | Wt 190.0 lb

## 2021-09-30 DIAGNOSIS — G4733 Obstructive sleep apnea (adult) (pediatric): Secondary | ICD-10-CM

## 2021-09-30 DIAGNOSIS — Z96652 Presence of left artificial knee joint: Secondary | ICD-10-CM

## 2021-09-30 DIAGNOSIS — Z0000 Encounter for general adult medical examination without abnormal findings: Secondary | ICD-10-CM

## 2021-09-30 DIAGNOSIS — Z9181 History of falling: Secondary | ICD-10-CM

## 2021-09-30 DIAGNOSIS — K635 Polyp of colon: Secondary | ICD-10-CM | POA: Diagnosis not present

## 2021-09-30 DIAGNOSIS — M109 Gout, unspecified: Secondary | ICD-10-CM | POA: Diagnosis not present

## 2021-09-30 DIAGNOSIS — I6523 Occlusion and stenosis of bilateral carotid arteries: Secondary | ICD-10-CM | POA: Diagnosis not present

## 2021-09-30 DIAGNOSIS — I482 Chronic atrial fibrillation, unspecified: Secondary | ICD-10-CM | POA: Diagnosis not present

## 2021-09-30 DIAGNOSIS — E785 Hyperlipidemia, unspecified: Secondary | ICD-10-CM

## 2021-09-30 DIAGNOSIS — Z951 Presence of aortocoronary bypass graft: Secondary | ICD-10-CM

## 2021-09-30 DIAGNOSIS — M1990 Unspecified osteoarthritis, unspecified site: Secondary | ICD-10-CM | POA: Diagnosis not present

## 2021-09-30 DIAGNOSIS — A630 Anogenital (venereal) warts: Secondary | ICD-10-CM | POA: Diagnosis not present

## 2021-09-30 DIAGNOSIS — Z7901 Long term (current) use of anticoagulants: Secondary | ICD-10-CM

## 2021-09-30 DIAGNOSIS — M545 Low back pain, unspecified: Secondary | ICD-10-CM | POA: Diagnosis not present

## 2021-09-30 DIAGNOSIS — I119 Hypertensive heart disease without heart failure: Secondary | ICD-10-CM | POA: Diagnosis not present

## 2021-09-30 DIAGNOSIS — R4189 Other symptoms and signs involving cognitive functions and awareness: Secondary | ICD-10-CM | POA: Diagnosis not present

## 2021-09-30 DIAGNOSIS — G8929 Other chronic pain: Secondary | ICD-10-CM | POA: Diagnosis not present

## 2021-09-30 DIAGNOSIS — I2510 Atherosclerotic heart disease of native coronary artery without angina pectoris: Secondary | ICD-10-CM | POA: Diagnosis not present

## 2021-09-30 DIAGNOSIS — K219 Gastro-esophageal reflux disease without esophagitis: Secondary | ICD-10-CM

## 2021-09-30 DIAGNOSIS — K589 Irritable bowel syndrome without diarrhea: Secondary | ICD-10-CM

## 2021-09-30 DIAGNOSIS — D696 Thrombocytopenia, unspecified: Secondary | ICD-10-CM | POA: Diagnosis not present

## 2021-09-30 DIAGNOSIS — J302 Other seasonal allergic rhinitis: Secondary | ICD-10-CM

## 2021-09-30 NOTE — Patient Instructions (Addendum)
°  Mr. Caligiuri , Thank you for taking time to come for your Medicare Wellness Visit. I appreciate your ongoing commitment to your health goals. Please review the following plan we discussed and let me know if I can assist you in the future.   These are the goals we discussed:  Goals      Client will report no worsening of symptoms of Atrial Fibrillation within the next 90 months     CARE PLAN ENTRY (see longtitudinal plan of care for additional care plan information)   Current Barriers:  Knowledge Deficit related to self management of Atrial Fibrillation  Case Manager Clinical Goal(s):  Verbalize understanding of the signs and symptoms of atrial Fibrillation within the next 90 days Will not experience hospital admissions within the next 90 days Attend all scheduled medical appointments Monitor for bleeding complication within the next 90 days  Interventions:  Provided verbal education on low sodium diet Provided written education on low sodium diet Provided education on CPAP cleaning Provided education on wearing compression hose  Patient Self Care Activities:  Adhere to medication regimen as prescribed Verbalize action plan when experiencing Atrial Fibrillation symptoms Will attend all scheduled appointments Monitor PT an INR was per ordered Adhere to low sodium diet   Follow up outreach call within the month of October     Weight less than 200lb     Low carb diet, low fat diet Portion control        This is a list of the screening recommended for you and due dates:  Health Maintenance  Topic Date Due   COVID-19 Vaccine (3 - Pfizer risk series) 10/16/2021*   Flu Shot  11/22/2021*   Zoster (Shingles) Vaccine (1 of 2) 12/28/2021*   Tetanus Vaccine  04/16/2028   Pneumonia Vaccine  Completed   HPV Vaccine  Aged Out  *Topic was postponed. The date shown is not the original due date.

## 2021-09-30 NOTE — Progress Notes (Signed)
Subjective:   Jeffery Reynolds is a 88 y.o. male who presents for Medicare Annual/Subsequent preventive examination.  Review of Systems    No ROS.  Medicare Wellness Virtual Visit.  Visual/audio telehealth visit, UTA vital signs.   See social history for additional risk factors.   Cardiac Risk Factors include: advanced age (>55men, >42 women);male gender     Objective:    Today's Vitals   09/30/21 1319  Weight: 190 lb (86.2 kg)  Height: 5\' 8"  (1.727 m)   Body mass index is 28.89 kg/m.  Advanced Directives 09/30/2021 09/27/2020 07/04/2020 09/27/2019 09/21/2019 06/10/2019 09/24/2018  Does Patient Have a Medical Advance Directive? Yes Yes No Yes Yes Yes Yes  Type of Industrial/product designer of [place];Living will - [place];Living will - Healthcare Power of [place];Living will  Does patient want to make changes to medical advance directive? No - Patient declined No - Patient declined - No - Patient declined No - Patient declined No - Guardian declined No - Patient declined  Copy of [place] in Chart? No - copy requested No - copy requested - No - copy requested - - No - copy requested  Would patient like information on creating a medical advance directive? - - No - Patient declined - - - -    Current Medications (verified) Outpatient Encounter Medications as of 09/30/2021  Medication Sig   memantine (NAMENDA) 5 MG tablet Take by mouth.   allopurinol (ZYLOPRIM) 300 MG tablet TAKE 1 TABLET ON MONDAY, WEDNESDAY AND FRIDAY   Artificial Tear Ointment (DRY EYES OP) Apply 1 drop to eye 3 (three) times daily.   atorvastatin (LIPITOR) 40 MG tablet TAKE 1 TABLET BY MOUTH EVERY DAY   donepezil (ARICEPT) 5 MG tablet Take 2.5 mg by mouth at bedtime.   furosemide (LASIX) 20 MG tablet TAKE 1 TABLET BY MOUTH EVERY OTHER DAY FOR FLUID RETENTION   memantine (NAMENDA) 5 MG tablet Take 5 mg by mouth  daily.   metoprolol tartrate (LOPRESSOR) 50 MG tablet TAKE 1 TABLET(50 MG) BY MOUTH TWICE DAILY   ofloxacin (OCUFLOX) 0.3 % ophthalmic solution Place 1 drop into the right eye 4 (four) times daily.   prednisoLONE acetate (PRED FORTE) 1 % ophthalmic suspension PLEASE SEE ATTACHED FOR DETAILED DIRECTIONS   warfarin (COUMADIN) 5 MG tablet TAKE 1 TABLET BY MOUTH DAILY AT 6 PM. TAKE 1 TABLET BY MOUTH ALL DAYS EXCEPT FOR MONDAY AND FRIDAY. MONDAY AND FRIDAY PT TAKES 2.5 MG.   No facility-administered encounter medications on file as of 09/30/2021.    Allergies (verified) Patient has no known allergies.   History: Past Medical History:  Diagnosis Date   Arthritis    CAD (coronary artery disease)    a. s/p 6-V CABG in [place] in 1995; b. MV 4/12: mild intensity, fixed perfusion defect involving the basal to mid anterior wall consistent with injury to the diagonal distribution (this finding was also noted on prior study dating back to 2008). Overall, no ischemia was noted and there was no high-risk prognostic indicators based on this study. EF 63%   Carotid artery disease ([place])    a. ultrasound 12/16: 1-39% bilateral ICA stenosis   Chronic atrial fibrillation ([place]) 2000   a. CHADS2VASc => 4 (HTN, age x 2, vascular disease); b. on Coumadin [facility]   Chronic back pain    Cognitive impairment    Colon polyps  Diastolic dysfunction    a. 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[place])    Past Surgical History:  Procedure Laterality Date   CARDIAC CATHETERIZATION      CORONARY ARTERY BYPASS GRAFT  1995   CABG x 6 in [place], [place]     bilateral    ROTATOR CUFF REPAIR     right   TONSILLECTOMY AND ADENOIDECTOMY  1943   Family History  Problem Relation Age of Onset   Heart disease Father    Heart attack Father 32       MI   Alcohol abuse Father    Hyperlipidemia Father    Hypertension Father    Heart attack Paternal Grandfather 62   Alcohol abuse Paternal Grandfather    Cancer Mother        Breast cancer and Lung Cancer   Diabetes Brother    Dementia Brother    Crohn's disease Daughter    Cancer Brother        lung cancer   Alcohol abuse Son    Alcohol abuse Maternal Grandfather    Alcohol abuse Son    Heart attack Paternal Grandmother 41   Social History   Socioeconomic History   Marital status: Divorced    Spouse name: Not on file   Number of children: 7   Years of education: 12   Highest education level: Not on file  Occupational History   Occupation: Scientist, research (medical) - Engineer, maintenance - [employer]    Comment: Retired 1995  Tobacco Use   Smoking status: Never   Smokeless tobacco: Never  Scientific laboratory technician Use: Never used  Substance and Sexual Activity   Alcohol use: Not Currently   Drug use: No   Sexual activity: Not Currently  Other Topics Concern   Not on file  Social History Narrative   Jeffery Reynolds grew up in [place], [place]. Most recently he was living in [place]. He moved to the [place] area since March 2015. Recently divorced and moved to this area to be close to one of his sons. He enjoys reading and has been going to the gym to exercise.      ives at home with his son, Jeffery Reynolds. He has 4 sons. Jeffery Reynolds is his POA.   He is able to complete his ADLs except needs some assist at intervals from Jeffery Reynolds to put his shoes on. Jeffery Reynolds completes the iADLs like preparing food, cleaning the home, bills, errands and provides transportation to medical appointments. T   Social Determinants  of Health   Financial Resource Strain: Low Risk    Difficulty of Paying Living Expenses: Not hard at all  Food Insecurity: No Food Insecurity   Worried About Charity fundraiser in the Last Year: Never true   Ran Out of Food in the Last Year: Never true  Transportation Needs: No Transportation Needs   Lack of Transportation (Medical): No   Lack of Transportation (Non-Medical): No  Physical  Activity: Sufficiently Active   Days of Exercise per Week: 5 days   Minutes of Exercise per Session: 30 min  Stress: No Stress Concern Present   Feeling of Stress : Not at all  Social Connections: Moderately Integrated   Frequency of Communication with Friends and Family: Three times a week   Frequency of Social Gatherings with Friends and Family: Three times a week   Attends Religious Services: 1 to 4 times per year   Active Member of Clubs or Organizations: Yes   Attends Archivist Meetings: 1 to 4 times per year   Marital Status: Divorced    Tobacco Counseling Counseling given: Not Answered   Clinical Intake:  Pre-visit preparation completed: Yes        Diabetes: No  How often do you need to have someone help you when you read instructions, pamphlets, or other written materials from your doctor or pharmacy?: 4 - Often   Interpreter Needed?: No      Activities of Daily Living In your present state of health, do you have any difficulty performing the following activities: 09/30/2021  Hearing? N  Vision? N  Difficulty concentrating or making decisions? N  Walking or climbing stairs? Y  Comment Cane in use.  Dressing or bathing? N  Doing errands, shopping? Y  Preparing Food and eating ? Y  Comment Son manages meal prep. Self feeds.  Using the Toilet? N  In the past six months, have you accidently leaked urine? N  Do you have problems with loss of bowel control? N  Managing your Medications? Y  Comment Son manages  Managing your Finances? Y  Comment Son Ecologist or managing your Housekeeping? Y  Comment Son manages  Some recent data might be hidden    Patient Care Team: Crecencio Mc, MD as PCP - General (Internal Medicine) Wellington Hampshire, MD as PCP - Cardiology (Cardiology)  Indicate any recent Medical Services you may have received from other than [facility] providers in the past year (date may be approximate).     Assessment:   This is a routine wellness examination for Fredie.  Hearing/Vision screen Hearing Screening - Comments:: Patient is able to hear conversational tones without difficulty.  No issues reported. Vision Screening - Comments:: Followed by Dr. Matilde Sprang  Wears corrective lenses  They have regular follow up with the ophthalmologist  Dietary issues and exercise activities discussed: Current Exercise Habits: Home exercise routine, Type of exercise: walking;stretching;strength training/weights, Intensity: Mild   Goals Addressed             This Visit's Progress    Weight less than 200lb   On track    Low carb diet, low fat diet Portion control       Depression Screen PHQ 2/9 Scores 09/30/2021 03/29/2021 09/27/2020 10/10/2019 09/21/2019 09/24/2018 09/23/2017  PHQ - 2 Score 0 0 0 0 0 0 0    Fall Risk Fall Risk  09/30/2021 03/29/2021 12/24/2020 09/27/2020 08/21/2020  Falls in the past year? 0 0 - 1 1  Comment - - - - -  Number falls in past yr: 0 0 0 1 0  Injury with Fall? - 0 1 - 1  Comment - - - - -  Risk for fall due to : - - History of fall(s) Impaired balance/gait History of fall(s);Impaired balance/gait;Impaired mobility  Follow up Falls evaluation completed Falls evaluation completed Falls evaluation completed Falls evaluation completed Falls evaluation completed  Comment - - - - -  FALL RISK PREVENTION PERTAINING TO THE HOME: Home free of loose throw rugs in walkways, pet beds, electrical cords, etc? Yes  Adequate lighting in your home to reduce risk of falls? Yes   ASSISTIVE DEVICES UTILIZED TO  PREVENT FALLS: Life alert? No  Use of a cane, walker or w/c? Yes  Grab bars in the bathroom? Yes  Shower chair or bench in shower? No  Elevated toilet seat or a handicapped toilet? No   TIMED UP AND GO: Was the test performed? No .   Cognitive Function: Patient is alert.  Taking medication as directed.  MMSE - Mini Mental State Exam 09/27/2019 09/23/2017 09/22/2016 09/11/2015  Not completed: Unable to complete - - -  Orientation to time - 5 5 5   Orientation to Place - 5 5 5   Registration - 3 3 3   Attention/ Calculation - 5 5 5   Recall - 3 1 3   Recall-comments - - 1 out of 3 words recalled -  Language- name 2 objects - 2 2 2   Language- repeat - 1 1 1   Language- follow 3 step command - 3 3 3   Language- read & follow direction - 1 1 1   Write a sentence - 1 1 1   Copy design - 1 1 1   Total score - 30 28 30      6CIT Screen 09/30/2021 09/24/2018  What Year? 0 points 0 points  What month? 0 points 0 points  What time? - 0 points  Count back from 20 0 points 0 points  Months in reverse 4 points 0 points  Repeat phrase - 0 points  Total Score - 0    Immunizations Immunization History  Administered Date(s) Administered   Influenza, High Dose Seasonal PF 05/26/2016, 07/10/2017, 05/05/2018   Influenza,inj,Quad PF,6+ Mos 06/10/2014   Influenza-Unspecified 06/30/2015   PFIZER(Purple Top)SARS-COV-2 Vaccination 10/07/2019, 11/04/2019   Pneumococcal Conjugate-13 08/10/2013   Pneumococcal Polysaccharide-23 08/14/2014   Tdap 11/12/2013, 04/16/2018   Zoster, Live 04/19/2014   Flu Vaccine status: Due, Education has been provided regarding the importance of this vaccine. Advised may receive this vaccine at local pharmacy or Health Dept. Aware to provide a copy of the vaccination record if obtained from local pharmacy or Health Dept. Verbalized acceptance and understanding.  Shingrix Completed?: No.    Education has been provided regarding the importance of this vaccine. Patient has been  advised to call insurance company to determine out of pocket expense if they have not yet received this vaccine. Advised may also receive vaccine at local pharmacy or Health Dept. Verbalized acceptance and understanding.  Screening Tests Health Maintenance  Topic Date Due   COVID-19 Vaccine (3 - Pfizer risk series) 10/16/2021 (Originally 12/02/2019)   INFLUENZA VACCINE  11/22/2021 (Originally 03/25/2021)   Zoster Vaccines- Shingrix (1 of 2) 12/28/2021 (Originally 08/26/1952)   TETANUS/TDAP  04/16/2028   Pneumonia Vaccine 57+ Years old  Completed   HPV VACCINES  Aged Out   Health Maintenance There are no preventive care reminders to display for this patient.  Lung Cancer Screening: (Low Dose CT Chest recommended if Age 92-80 years, 30 pack-year currently smoking OR have quit w/in 15years.) does not qualify.   Hepatitis C Screening: does not qualify  Vision Screening: Recommended annual ophthalmology exams for early detection of glaucoma and other disorders of the eye.  Dental Screening: Recommended annual dental exams for proper oral hygiene  Community Resource Referral / Chronic Care Management: CRR required this visit?  No   CCM required this visit?  No      Plan:   Keep all routine maintenance appointments.   I have personally reviewed and noted the following in the patients chart:   Medical and social history Use of alcohol, tobacco or illicit drugs  Current medications and supplements including opioid prescriptions. Patient is not currently taking opioid prescriptions. Functional ability and status Nutritional status Physical activity Advanced directives List of other physicians Hospitalizations, surgeries, and ER visits in previous 12 months Vitals Screenings to include cognitive, depression, and falls Referrals and appointments  In addition, I have reviewed and discussed with patient certain preventive protocols, quality metrics, and best practice recommendations. A  written personalized care plan for preventive services as well as general preventive health recommendations were provided to patient.     Varney Biles, LPN   09/01/5629

## 2021-10-07 DIAGNOSIS — R4189 Other symptoms and signs involving cognitive functions and awareness: Secondary | ICD-10-CM | POA: Diagnosis not present

## 2021-10-07 DIAGNOSIS — G8929 Other chronic pain: Secondary | ICD-10-CM | POA: Diagnosis not present

## 2021-10-07 DIAGNOSIS — I119 Hypertensive heart disease without heart failure: Secondary | ICD-10-CM | POA: Diagnosis not present

## 2021-10-07 DIAGNOSIS — I6523 Occlusion and stenosis of bilateral carotid arteries: Secondary | ICD-10-CM | POA: Diagnosis not present

## 2021-10-07 DIAGNOSIS — M545 Low back pain, unspecified: Secondary | ICD-10-CM | POA: Diagnosis not present

## 2021-10-07 DIAGNOSIS — I482 Chronic atrial fibrillation, unspecified: Secondary | ICD-10-CM | POA: Diagnosis not present

## 2021-10-11 ENCOUNTER — Other Ambulatory Visit: Payer: Self-pay | Admitting: Internal Medicine

## 2021-10-14 DIAGNOSIS — I482 Chronic atrial fibrillation, unspecified: Secondary | ICD-10-CM | POA: Diagnosis not present

## 2021-10-14 DIAGNOSIS — I119 Hypertensive heart disease without heart failure: Secondary | ICD-10-CM | POA: Diagnosis not present

## 2021-10-14 DIAGNOSIS — G8929 Other chronic pain: Secondary | ICD-10-CM | POA: Diagnosis not present

## 2021-10-14 DIAGNOSIS — M545 Low back pain, unspecified: Secondary | ICD-10-CM | POA: Diagnosis not present

## 2021-10-14 DIAGNOSIS — R4189 Other symptoms and signs involving cognitive functions and awareness: Secondary | ICD-10-CM | POA: Diagnosis not present

## 2021-10-14 DIAGNOSIS — I6523 Occlusion and stenosis of bilateral carotid arteries: Secondary | ICD-10-CM | POA: Diagnosis not present

## 2021-10-21 ENCOUNTER — Emergency Department: Payer: Medicare Other

## 2021-10-21 ENCOUNTER — Telehealth: Payer: Self-pay | Admitting: Internal Medicine

## 2021-10-21 ENCOUNTER — Emergency Department
Admission: EM | Admit: 2021-10-21 | Discharge: 2021-10-21 | Disposition: A | Discharge status: Home / Self Care | Payer: Medicare Other | Attending: Emergency Medicine | Admitting: Emergency Medicine

## 2021-10-21 ENCOUNTER — Telehealth: Payer: Self-pay | Admitting: Cardiovascular Disease

## 2021-10-21 ENCOUNTER — Other Ambulatory Visit: Payer: Self-pay

## 2021-10-21 DIAGNOSIS — Z7901 Long term (current) use of anticoagulants: Secondary | ICD-10-CM | POA: Insufficient documentation

## 2021-10-21 DIAGNOSIS — J9811 Atelectasis: Secondary | ICD-10-CM | POA: Diagnosis not present

## 2021-10-21 DIAGNOSIS — F0390 Unspecified dementia without behavioral disturbance: Secondary | ICD-10-CM | POA: Insufficient documentation

## 2021-10-21 DIAGNOSIS — I2510 Atherosclerotic heart disease of native coronary artery without angina pectoris: Secondary | ICD-10-CM | POA: Insufficient documentation

## 2021-10-21 DIAGNOSIS — Z9049 Acquired absence of other specified parts of digestive tract: Secondary | ICD-10-CM | POA: Diagnosis not present

## 2021-10-21 DIAGNOSIS — R042 Hemoptysis: Secondary | ICD-10-CM | POA: Diagnosis not present

## 2021-10-21 LAB — BASIC METABOLIC PANEL
Anion gap: 8 (ref 5–15)
BUN: 22 mg/dL (ref 8–23)
CO2: 27 mmol/L (ref 22–32)
Calcium: 9.2 mg/dL (ref 8.9–10.3)
Chloride: 104 mmol/L (ref 98–111)
Creatinine, Ser: 1.1 mg/dL (ref 0.61–1.24)
GFR, Estimated: >60 mL/min (ref >60)
Glucose, Bld: 118 mg/dL — ABNORMAL HIGH (ref 70–99)
Potassium: 3.9 mmol/L (ref 3.5–5.1)
Sodium: 139 mmol/L (ref 135–145)

## 2021-10-21 LAB — HEPATIC FUNCTION PANEL
ALT: 22 U/L (ref 0–44)
AST: 21 U/L (ref 15–41)
Albumin: 3.8 g/dL (ref 3.5–5.0)
Alkaline Phosphatase: 106 U/L (ref 38–126)
Bilirubin, Direct: 0.3 mg/dL — ABNORMAL HIGH (ref 0.0–0.2)
Indirect Bilirubin: 1.6 mg/dL — ABNORMAL HIGH (ref 0.3–0.9)
Total Bilirubin: 1.9 mg/dL — ABNORMAL HIGH (ref 0.3–1.2)
Total Protein: 6.7 g/dL (ref 6.5–8.1)

## 2021-10-21 LAB — CBC
HCT: 43.7 % (ref 39.0–52.0)
Hemoglobin: 14.1 g/dL (ref 13.0–17.0)
MCH: 31.7 pg (ref 26.0–34.0)
MCHC: 32.3 g/dL (ref 30.0–36.0)
MCV: 98.2 fL (ref 80.0–100.0)
Platelets: 89 10*3/uL — ABNORMAL LOW (ref 150–400)
RBC: 4.45 MIL/uL (ref 4.22–5.81)
RDW: 14.3 % (ref 11.5–15.5)
WBC: 3.5 10*3/uL — ABNORMAL LOW (ref 4.0–10.5)
nRBC: 0 % (ref 0.0–0.2)

## 2021-10-21 LAB — PROTIME-INR
INR: 2.5 — ABNORMAL HIGH (ref 0.8–1.2)
Prothrombin Time: 26.7 seconds — ABNORMAL HIGH (ref 11.4–15.2)

## 2021-10-21 MED ORDER — IOHEXOL 350 MG/ML SOLN
75.0000 mL | Freq: Once | INTRAVENOUS | Status: AC | PRN
  Administered 2021-10-21: 75 mL via INTRAVENOUS

## 2021-10-21 NOTE — Telephone Encounter (Signed)
Spoke w/ [name].   Clarified w/ him to take his father to the ER.  Advised him that I spoke w/ Shawn, but he must have misunderstood, as he called Dr. Lupita Dawn office. [name] states that he will take pt to the ER now.  He is appreciative of the call.

## 2021-10-21 NOTE — Telephone Encounter (Signed)
Attempted to call [name] back, but he is not answering. Called pt's son Jeffery Reynolds and advised him of Dr. Lupita Dawn recommendation to take pt to the ER. He verbalizes understanding and is agreeable.  Asked him to call back if we can be of further assistance.

## 2021-10-21 NOTE — ED Notes (Signed)
Pt states he has intermittently been coughing with blood in sputum x a few months. Pt denies CP, SHOB. Pt does report sometimes he is dizzy in the AM when he wakes up, but the dizziness goes away once he stands up.

## 2021-10-21 NOTE — ED Provider Triage Note (Signed)
Emergency Medicine Provider Triage Evaluation Note  Jeffery Reynolds , a 88 y.o. male  was evaluated in triage.  Pt complains of coughing up blood for about 1 month.  Patient is on Coumadin..  Review of Systems  Positive: Cough Negative: Denies pain  Physical Exam  BP 104/62 (BP Location: Left Arm)    Pulse 76    Temp 98.4 F (36.9 C) (Oral)    Resp 16    Ht 5\' 8"  (1.727 m)    Wt 86.2 kg    SpO2 94%    BMI 28.89 kg/m  Gen:   Awake, no distress   Resp:  Normal effort  MSK:   Moves extremities without difficulty  Other:    Medical Decision Making  Medically screening exam initiated at 2:18 PM.  Appropriate orders placed.  [name] was informed that the remainder of the evaluation will be completed by another provider, this initial triage assessment does not replace that evaluation, and the importance of remaining in the ED until their evaluation is complete.  Labs and imaging ordered   Versie Starks, PA-C 10/21/21 1419

## 2021-10-21 NOTE — ED Provider Notes (Signed)
[facility] Provider Note    Event Date/Time   First MD Initiated Contact with Patient 10/21/21 1758     (approximate)   History   Hemoptysis   HPI  Jeffery Reynolds is a 88 y.o. male  who, per clinic note dated 09/13/21 has history of afib, CAD, on coumadin, who presents to the emergency department today because of concern for coughing up blood. History is slightly limited secondary to dementia and some history is obtained from son at bedside. The patient states that he has been coughing up blood for the past 3 to 4 weeks. He denies any associated shortness of breath or chest pain. Denies any recent trauma to his chest.     Physical Exam   Triage Vital Signs: ED Triage Vitals  Enc Vitals Group     BP 10/21/21 1416 104/62     Pulse Rate 10/21/21 1416 76     Resp 10/21/21 1416 16     Temp 10/21/21 1416 98.4 F (36.9 C)     Temp Source 10/21/21 1416 Oral     SpO2 10/21/21 1416 94 %     Weight 10/21/21 1417 190 lb 0.6 oz (86.2 kg)     Height 10/21/21 1417 5\' 8"  (1.727 m)     Head Circumference --      Peak Flow --      Pain Score 10/21/21 1417 0     Pain Loc --      Pain Edu? --      Excl. in [place]? --     Most recent vital signs: Vitals:   10/21/21 1416 10/21/21 1735  BP: 104/62 111/68  Pulse: 76 75  Resp: 16 17  Temp: 98.4 F (36.9 C)   SpO2: 94% 96%    General: Awake, no distress.  CV:  Good peripheral perfusion.  Resp:  Normal effort. Lungs clear to auscultation Abd:  No distention.    ED Results / Procedures / Treatments   Labs (all labs ordered are listed, but only abnormal results are displayed) Labs Reviewed  CBC - Abnormal; Notable for the following components:      Result Value   WBC 3.5 (*)    Platelets 89 (*)    All other components within normal limits  BASIC METABOLIC PANEL - Abnormal; Notable for the following components:   Glucose, Bld 118 (*)    All other components within normal limits  PROTIME-INR - Abnormal;  Notable for the following components:   Prothrombin Time 26.7 (*)    INR 2.5 (*)    All other components within normal limits  HEPATIC FUNCTION PANEL - Abnormal; Notable for the following components:   Total Bilirubin 1.9 (*)    Bilirubin, Direct 0.3 (*)    Indirect Bilirubin 1.6 (*)    All other components within normal limits     EKG  None   RADIOLOGY I independently interpreted and visualized the CXR. My interpretation: No pneumonia, no pneumothorax. Radiology interpretation:  IMPRESSION:  No acute cardiopulmonary findings.   CT angio PE I independently interpreted and visualized the CT angio PE. My interpretation: No large PE Radiology interpretation:  IMPRESSION:  1. No pulmonary embolus. Limited evaluation of the subsegmental  level due to motion artifact.  2. No acute intrathoracic abnormality.  3.  Aortic Atherosclerosis (ICD10-I70.0).    PROCEDURES:  Critical Care performed: No  Procedures   MEDICATIONS ORDERED IN ED: Medications - No data to display   IMPRESSION /  MDM / ASSESSMENT AND PLAN / ED COURSE  I reviewed the triage vital signs and the nursing notes.                              Differential diagnosis includes, but is not limited to, bronchiectasis, PE, infection.  Patient presented to the emergency department today because of concern for coughing up blood for the past roughly 3 weeks. Blood work without any concerning anemia. Platelet count is low which is patient's baseline and INR is theraputic. Did obtain a CT angio PE to evaluate for any PE, infection or other etiology of the bleeding. CT angio without concerning findings, patient without any episodes here in the emergency department. Given that it has been present for three weeks and that patient and family would like to be discharged home do think it is reasonable for discharge. Discussed bleeding return precautions. Will give pulmonary follow up information.   FINAL CLINICAL  IMPRESSION(S) / ED DIAGNOSES   Final diagnoses:  Hemoptysis       Note:  This document was prepared using Dragon voice recognition software and may include unintentional dictation errors.    Nance Pear, MD 10/21/21 2041

## 2021-10-21 NOTE — ED Notes (Signed)
Pt transported to CT via stretcher with CT tech. °

## 2021-10-21 NOTE — Telephone Encounter (Signed)
Patient is a coumadin patient bleeding from his mouth when he coughs. Please assist

## 2021-10-21 NOTE — ED Triage Notes (Signed)
Pt here with hemoptysis for a few weeks. Pt sent here by primary for eval. Pt in NAD in triage.

## 2021-10-21 NOTE — Telephone Encounter (Signed)
Patient's son Raquel Sarna called stating that his father has been coughing a lot and has blood in his sputum. I transferred the call to Access Nurse.

## 2021-10-21 NOTE — ED Notes (Signed)
Pt ambulatory into nurse's station wanting to speak to MD. Pt easily redirected back to room, states he would like to go home. MD notified that pt would like to speak to him.

## 2021-10-21 NOTE — Telephone Encounter (Signed)
Spoke w/ pt's son [name].  He reports that pt got choked while eating last night and has been coughing up blood ever since.  He reports that pt got a new bed and has fallen out of it "a few times". He states that pt denies any pain anywhere, so he does not think he is hurt anywhere. He would like to know if pt needs INR check. Advised him that pt's INR was perfect at 2.5 at last check, but that his risk of bleeding his higher b/c he is on the blood thinner.  Advised him that we need to find the source of the bleeding, so recommended pt be seen by PCP or urgent care.  He is agreeable and will call pt's PCP office now.

## 2021-10-21 NOTE — Telephone Encounter (Signed)
See previous message. Pt's son was advised to take pt to ED.

## 2021-10-21 NOTE — Discharge Instructions (Addendum)
Please return for any shortness of breath, weakness, increased bleeding, chest pain or any other new or concerning symptoms.

## 2021-10-21 NOTE — ED Notes (Signed)
Pt transported from CT to pt room via stretcher with CT tech.

## 2021-10-23 DIAGNOSIS — I119 Hypertensive heart disease without heart failure: Secondary | ICD-10-CM | POA: Diagnosis not present

## 2021-10-23 DIAGNOSIS — I482 Chronic atrial fibrillation, unspecified: Secondary | ICD-10-CM | POA: Diagnosis not present

## 2021-10-23 DIAGNOSIS — I6523 Occlusion and stenosis of bilateral carotid arteries: Secondary | ICD-10-CM | POA: Diagnosis not present

## 2021-10-23 DIAGNOSIS — E785 Hyperlipidemia, unspecified: Secondary | ICD-10-CM | POA: Diagnosis not present

## 2021-10-23 DIAGNOSIS — J302 Other seasonal allergic rhinitis: Secondary | ICD-10-CM | POA: Diagnosis not present

## 2021-10-23 DIAGNOSIS — Z951 Presence of aortocoronary bypass graft: Secondary | ICD-10-CM | POA: Diagnosis not present

## 2021-10-23 DIAGNOSIS — K635 Polyp of colon: Secondary | ICD-10-CM | POA: Diagnosis not present

## 2021-10-23 DIAGNOSIS — M545 Low back pain, unspecified: Secondary | ICD-10-CM | POA: Diagnosis not present

## 2021-10-23 DIAGNOSIS — Z7901 Long term (current) use of anticoagulants: Secondary | ICD-10-CM | POA: Diagnosis not present

## 2021-10-23 DIAGNOSIS — K219 Gastro-esophageal reflux disease without esophagitis: Secondary | ICD-10-CM | POA: Diagnosis not present

## 2021-10-23 DIAGNOSIS — R4189 Other symptoms and signs involving cognitive functions and awareness: Secondary | ICD-10-CM | POA: Diagnosis not present

## 2021-10-23 DIAGNOSIS — A630 Anogenital (venereal) warts: Secondary | ICD-10-CM | POA: Diagnosis not present

## 2021-10-23 DIAGNOSIS — K589 Irritable bowel syndrome without diarrhea: Secondary | ICD-10-CM | POA: Diagnosis not present

## 2021-10-23 DIAGNOSIS — D696 Thrombocytopenia, unspecified: Secondary | ICD-10-CM | POA: Diagnosis not present

## 2021-10-23 DIAGNOSIS — G8929 Other chronic pain: Secondary | ICD-10-CM | POA: Diagnosis not present

## 2021-10-23 DIAGNOSIS — Z9181 History of falling: Secondary | ICD-10-CM | POA: Diagnosis not present

## 2021-10-23 DIAGNOSIS — Z96652 Presence of left artificial knee joint: Secondary | ICD-10-CM | POA: Diagnosis not present

## 2021-10-23 DIAGNOSIS — M109 Gout, unspecified: Secondary | ICD-10-CM | POA: Diagnosis not present

## 2021-10-23 DIAGNOSIS — I2510 Atherosclerotic heart disease of native coronary artery without angina pectoris: Secondary | ICD-10-CM | POA: Diagnosis not present

## 2021-10-23 DIAGNOSIS — M1990 Unspecified osteoarthritis, unspecified site: Secondary | ICD-10-CM | POA: Diagnosis not present

## 2021-10-23 DIAGNOSIS — G4733 Obstructive sleep apnea (adult) (pediatric): Secondary | ICD-10-CM | POA: Diagnosis not present

## 2021-10-24 DIAGNOSIS — I482 Chronic atrial fibrillation, unspecified: Secondary | ICD-10-CM | POA: Diagnosis not present

## 2021-10-24 DIAGNOSIS — I6523 Occlusion and stenosis of bilateral carotid arteries: Secondary | ICD-10-CM | POA: Diagnosis not present

## 2021-10-24 DIAGNOSIS — M545 Low back pain, unspecified: Secondary | ICD-10-CM | POA: Diagnosis not present

## 2021-10-24 DIAGNOSIS — R4189 Other symptoms and signs involving cognitive functions and awareness: Secondary | ICD-10-CM | POA: Diagnosis not present

## 2021-10-24 DIAGNOSIS — I119 Hypertensive heart disease without heart failure: Secondary | ICD-10-CM | POA: Diagnosis not present

## 2021-10-24 DIAGNOSIS — G8929 Other chronic pain: Secondary | ICD-10-CM | POA: Diagnosis not present

## 2021-10-31 DIAGNOSIS — I119 Hypertensive heart disease without heart failure: Secondary | ICD-10-CM | POA: Diagnosis not present

## 2021-10-31 DIAGNOSIS — M545 Low back pain, unspecified: Secondary | ICD-10-CM | POA: Diagnosis not present

## 2021-10-31 DIAGNOSIS — I482 Chronic atrial fibrillation, unspecified: Secondary | ICD-10-CM | POA: Diagnosis not present

## 2021-10-31 DIAGNOSIS — G8929 Other chronic pain: Secondary | ICD-10-CM | POA: Diagnosis not present

## 2021-10-31 DIAGNOSIS — I6523 Occlusion and stenosis of bilateral carotid arteries: Secondary | ICD-10-CM | POA: Diagnosis not present

## 2021-10-31 DIAGNOSIS — R4189 Other symptoms and signs involving cognitive functions and awareness: Secondary | ICD-10-CM | POA: Diagnosis not present

## 2021-11-04 DIAGNOSIS — R4189 Other symptoms and signs involving cognitive functions and awareness: Secondary | ICD-10-CM | POA: Diagnosis not present

## 2021-11-04 DIAGNOSIS — I6523 Occlusion and stenosis of bilateral carotid arteries: Secondary | ICD-10-CM | POA: Diagnosis not present

## 2021-11-04 DIAGNOSIS — G8929 Other chronic pain: Secondary | ICD-10-CM | POA: Diagnosis not present

## 2021-11-04 DIAGNOSIS — I482 Chronic atrial fibrillation, unspecified: Secondary | ICD-10-CM | POA: Diagnosis not present

## 2021-11-04 DIAGNOSIS — M545 Low back pain, unspecified: Secondary | ICD-10-CM | POA: Diagnosis not present

## 2021-11-04 DIAGNOSIS — I119 Hypertensive heart disease without heart failure: Secondary | ICD-10-CM | POA: Diagnosis not present

## 2021-11-06 ENCOUNTER — Ambulatory Visit (INDEPENDENT_AMBULATORY_CARE_PROVIDER_SITE_OTHER): Payer: Medicare Other

## 2021-11-06 ENCOUNTER — Other Ambulatory Visit: Payer: Self-pay

## 2021-11-06 DIAGNOSIS — Z5181 Encounter for therapeutic drug level monitoring: Secondary | ICD-10-CM

## 2021-11-06 DIAGNOSIS — I4819 Other persistent atrial fibrillation: Secondary | ICD-10-CM | POA: Diagnosis not present

## 2021-11-06 LAB — POCT INR: INR: 2 (ref 2.0–3.0)

## 2021-11-06 NOTE — Patient Instructions (Signed)
-   continue taking warfarin 1 tablet daily except for 1/2 a tablet on WEDNESDAYS - Recheck INR in 6 weeks 

## 2021-12-01 ENCOUNTER — Other Ambulatory Visit: Payer: Self-pay | Admitting: Internal Medicine

## 2021-12-04 DIAGNOSIS — Z20822 Contact with and (suspected) exposure to covid-19: Secondary | ICD-10-CM | POA: Diagnosis not present

## 2021-12-17 ENCOUNTER — Emergency Department: Payer: Medicare Other

## 2021-12-17 ENCOUNTER — Other Ambulatory Visit: Payer: Self-pay

## 2021-12-17 ENCOUNTER — Emergency Department
Admission: EM | Admit: 2021-12-17 | Discharge: 2021-12-17 | Disposition: A | Discharge status: Home / Self Care | Payer: Medicare Other | Attending: Emergency Medicine | Admitting: Emergency Medicine

## 2021-12-17 ENCOUNTER — Encounter: Payer: Self-pay | Admitting: Emergency Medicine

## 2021-12-17 DIAGNOSIS — S0990XA Unspecified injury of head, initial encounter: Secondary | ICD-10-CM

## 2021-12-17 DIAGNOSIS — G319 Degenerative disease of nervous system, unspecified: Secondary | ICD-10-CM | POA: Diagnosis not present

## 2021-12-17 DIAGNOSIS — I10 Essential (primary) hypertension: Secondary | ICD-10-CM | POA: Diagnosis not present

## 2021-12-17 DIAGNOSIS — S022XXA Fracture of nasal bones, initial encounter for closed fracture: Secondary | ICD-10-CM | POA: Diagnosis not present

## 2021-12-17 DIAGNOSIS — W01198A Fall on same level from slipping, tripping and stumbling with subsequent striking against other object, initial encounter: Secondary | ICD-10-CM | POA: Diagnosis not present

## 2021-12-17 DIAGNOSIS — I4891 Unspecified atrial fibrillation: Secondary | ICD-10-CM | POA: Diagnosis not present

## 2021-12-17 DIAGNOSIS — Y9389 Activity, other specified: Secondary | ICD-10-CM | POA: Insufficient documentation

## 2021-12-17 DIAGNOSIS — M47812 Spondylosis without myelopathy or radiculopathy, cervical region: Secondary | ICD-10-CM | POA: Diagnosis not present

## 2021-12-17 DIAGNOSIS — M2578 Osteophyte, vertebrae: Secondary | ICD-10-CM | POA: Diagnosis not present

## 2021-12-17 DIAGNOSIS — Z7901 Long term (current) use of anticoagulants: Secondary | ICD-10-CM | POA: Diagnosis not present

## 2021-12-17 DIAGNOSIS — S0181XA Laceration without foreign body of other part of head, initial encounter: Secondary | ICD-10-CM | POA: Insufficient documentation

## 2021-12-17 DIAGNOSIS — Y92007 Garden or yard of unspecified non-institutional (private) residence as the place of occurrence of the external cause: Secondary | ICD-10-CM | POA: Diagnosis not present

## 2021-12-17 DIAGNOSIS — J342 Deviated nasal septum: Secondary | ICD-10-CM | POA: Diagnosis not present

## 2021-12-17 DIAGNOSIS — W19XXXA Unspecified fall, initial encounter: Secondary | ICD-10-CM | POA: Diagnosis not present

## 2021-12-17 DIAGNOSIS — S0003XA Contusion of scalp, initial encounter: Secondary | ICD-10-CM | POA: Diagnosis not present

## 2021-12-17 DIAGNOSIS — S0121XA Laceration without foreign body of nose, initial encounter: Secondary | ICD-10-CM | POA: Insufficient documentation

## 2021-12-17 DIAGNOSIS — R52 Pain, unspecified: Secondary | ICD-10-CM | POA: Diagnosis not present

## 2021-12-17 DIAGNOSIS — S199XXA Unspecified injury of neck, initial encounter: Secondary | ICD-10-CM | POA: Diagnosis not present

## 2021-12-17 DIAGNOSIS — R040 Epistaxis: Secondary | ICD-10-CM | POA: Diagnosis not present

## 2021-12-17 DIAGNOSIS — M4802 Spinal stenosis, cervical region: Secondary | ICD-10-CM | POA: Diagnosis not present

## 2021-12-17 NOTE — ED Provider Notes (Signed)
? ?[facility] ?Provider Note ? ? ? Event Date/Time  ? First MD Initiated Contact with Patient 12/17/21 1705   ?  (approximate) ? ?History  ? ?Chief Complaint: Fall ? ?HPI ? ?Jeffery Reynolds is a 88 y.o. male with a past medical history of atrial fibrillation on Coumadin who presents to the emergency department after head injury.  According to the patient he was in the garden working when attempting to get up he lost his balance falling forward hitting his head.  Patient states bleeding from the forehead and the bridge of the nose as well as some nosebleeding as well.  Patient is on Coumadin so he came to the emergency department for evaluation.  Patient denies LOC.  Denies nausea or vomiting.  Currently hemostatic.  No other injuries.  Patient states he has been ambulatory since the event without issue. ? ?Physical Exam  ? ?Triage Vital Signs: ?ED Triage Vitals  ?Enc Vitals Group  ?   BP 12/17/21 1714 (!) 149/71  ?   Pulse Rate 12/17/21 1714 62  ?   Resp 12/17/21 1714 18  ?   Temp 12/17/21 1714 97.6 ?F (36.4 ?C)  ?   Temp Source 12/17/21 1714 Oral  ?   SpO2 12/17/21 1714 100 %  ?   Weight 12/17/21 1714 185 lb (83.9 kg)  ?   Height 12/17/21 1714 '5\' 8"'$  (1.727 m)  ?   Head Circumference --   ?   Peak Flow --   ?   Pain Score 12/17/21 1715 0  ?   Pain Loc --   ?   Pain Edu? --   ?   Excl. in [place]? --   ? ? ?Most recent vital signs: ?Vitals:  ? 12/17/21 1714  ?BP: (!) 149/71  ?Pulse: 62  ?Resp: 18  ?Temp: 97.6 ?F (36.4 ?C)  ?SpO2: 100%  ? ? ?General: Awake, no distress.  ?CV:  Good peripheral perfusion.  Regular rate and rhythm  ?Resp:  Normal effort.  Equal breath sounds bilaterally.  ?Abd:  No distention.  Soft, nontender.  No rebound or guarding. ?Other:  Patient has approximately 2 cm vertical laceration to the center of the forehead with a smaller 0.5 cm laceration of the bridge of the nose both are hemostatic and nongaping. ? ? ?ED Results / Procedures / Treatments  ? ?RADIOLOGY ? ?I  personally reviewed the CT images of the brain.  Has large ventricles but no significant abnormality noted. ?CT scan read as negative by radiology. ?No acute fracture of the C-spine or face.  Old nasal fracture noted. ? ?MEDICATIONS ORDERED IN ED: ?Medications - No data to display ? ? ?IMPRESSION / MDM / ASSESSMENT AND PLAN / ED COURSE  ?I reviewed the triage vital signs and the nursing notes. ? ?Patient presents to the emergency department after a fall with head injury.  Patient is on Coumadin states significant mount of bleeding prior to arrival but after placing pressure on the wound bleeding has stopped.  Currently hemostatic.  Both lacerations are nongaping and hemostatic and would be amenable to Dermabond.  Patient agreeable to plan.  I have applied Dermabond to both lacerations.  We will obtain CT imaging the head face and C-spine as precaution.  Patient agreeable to plan. ? ?CT scans are negative for acute abnormality.  Patient appears well, we will discharge home with PCP follow-up.  Discussed return precautions. ? ?FINAL CLINICAL IMPRESSION(S) / ED DIAGNOSES  ? ?Fall ?Head injury ?  Laceration ? ?Rx / DC Orders  ? ?ECP follow-up ? ?Note:  This document was prepared using Dragon voice recognition software and may include unintentional dictation errors. ?  ?Harvest Dark, MD ?12/17/21 1833 ? ?

## 2021-12-17 NOTE — ED Triage Notes (Signed)
Presents via EMS  s/p fall  was working outside tripped fell  hitting side walk  laceration noted to forehead and bridge of nose  no loc ?

## 2021-12-17 NOTE — Discharge Instructions (Signed)
Please keep the laceration/cut dry for the next 3 days.  Please follow-up with your doctor this week for recheck/reevaluation.  Return to the emergency department for any bleeding or any other symptom personally concerning to yourself. ?

## 2021-12-18 ENCOUNTER — Telehealth: Payer: Self-pay | Admitting: Cardiovascular Disease

## 2021-12-18 ENCOUNTER — Ambulatory Visit (INDEPENDENT_AMBULATORY_CARE_PROVIDER_SITE_OTHER): Payer: Medicare Other

## 2021-12-18 DIAGNOSIS — Z5181 Encounter for therapeutic drug level monitoring: Secondary | ICD-10-CM

## 2021-12-18 DIAGNOSIS — I4819 Other persistent atrial fibrillation: Secondary | ICD-10-CM | POA: Diagnosis not present

## 2021-12-18 LAB — POCT INR: INR: 2.6 (ref 2.0–3.0)

## 2021-12-18 NOTE — Patient Instructions (Signed)
-   continue taking warfarin 1 tablet daily except for 1/2 a tablet on WEDNESDAYS - Recheck INR in 6 weeks 

## 2021-12-18 NOTE — Telephone Encounter (Signed)
Spoke with the patients son Percell Miller. ? ?The patients fall occurred yesterday. He was evaluated in the ED. His CTs of the head, face and spine were unremarkable. ? ?Percell Miller was calling to give the CV[facility] nurse an update. He asked for [place], [place].  I advised him that she is no longer the CV[facility] nurse and Legrand Como, RN is now seeing those patients. ? ?Percell Miller sts that they rescheduled the pt appt for today to later this afternoon. The patients is sore from the fall. He denies any change in the pts LOC. ?They are going to try to keep the appt but may need to reschedule if Percell Miller is not able to get the pt up and out. ?Angela Cox that I will fwd Legrand Como the update. ?

## 2021-12-18 NOTE — Telephone Encounter (Signed)
? ?  Pt's son calling, he said pt fell and hit his head, right now he is not feeling good. They r/s pt's coumadin at 3:30 pm today, however, if pt is still not feeling well, he wanted to ask coumadin if pt ok to r/s it next Wednesday. ?

## 2022-01-16 ENCOUNTER — Telehealth: Payer: Self-pay

## 2022-01-16 NOTE — Telephone Encounter (Signed)
Pt's son refused ED. Pt has been scheduled for with Dr. Olivia Mackie tomorrow at 2:20.

## 2022-01-16 NOTE — Telephone Encounter (Signed)
Patient's son, Arnet Hofferber, called to state patient is getting some blood on the toilet paper when he wipes his rear end.  [name] states he is seeing spots of blood on the toilet paper, but he hasn't looked to see if blood is appearing in patient's stool.  [name] states this started a couple of days ago.  [name] states he thinks it may be hemorrhoids, as patient does sit a lot, but he isn't positive and is also concerned because patient is on coumadin.  [name] states patient states his rear end is burning consistently, not just when he goes to the bathroom.  [name] states he has applied vaseline to the area.  [name] states patient has dementia.  [name] states he is currently living with patient and taking care of him.  Transferred call to Access Nurse.

## 2022-01-17 ENCOUNTER — Encounter: Payer: Self-pay | Admitting: Internal Medicine

## 2022-01-17 ENCOUNTER — Ambulatory Visit (INDEPENDENT_AMBULATORY_CARE_PROVIDER_SITE_OTHER): Payer: Medicare Other | Admitting: Internal Medicine

## 2022-01-17 VITALS — BP 110/70 | HR 75 | Temp 97.7°F | Resp 16 | Ht 68.0 in | Wt 183.0 lb

## 2022-01-17 DIAGNOSIS — E785 Hyperlipidemia, unspecified: Secondary | ICD-10-CM | POA: Diagnosis not present

## 2022-01-17 DIAGNOSIS — K921 Melena: Secondary | ICD-10-CM

## 2022-01-17 DIAGNOSIS — K649 Unspecified hemorrhoids: Secondary | ICD-10-CM | POA: Diagnosis not present

## 2022-01-17 LAB — HEMOCCULT GUIAC POC 1CARD (OFFICE): Fecal Occult Blood, POC: POSITIVE — AB

## 2022-01-17 MED ORDER — HYDROCORTISONE (PERIANAL) 2.5 % EX CREA: 1.0000 "application " | TOPICAL_CREAM | Freq: Three times a day (TID) | CUTANEOUS | 0 refills | Status: DC

## 2022-01-17 MED ORDER — ATORVASTATIN CALCIUM 40 MG PO TABS: 40.0000 mg | ORAL_TABLET | Freq: Every day | ORAL | 3 refills | Status: AC

## 2022-01-17 NOTE — Patient Instructions (Addendum)
Anusol and vasoline  Prep H or Tucks  Dr. Donah Driver doctor    Phone Fax E-mail Address  [phone] Not available [place] [address]     Specialties     Gastroenterology             How to Take a Sitz Bath A sitz bath is a warm water bath that may be used to care for your rectum, genital area, or the area between your rectum and genitals (perineum). In a sitz bath, the water only comes up to your hips and covers your buttocks. A sitz bath may be done in a bathtub or with a portable sitz bath that fits over the toilet. Your health care provider may recommend a sitz bath to help: Relieve pain and discomfort after delivering a baby. Relieve pain and itching from hemorrhoids or anal fissures. Relieve pain after certain surgeries. Relax muscles that are sore or tight. How to take a sitz bath Take 3-4 sitz baths a day, or as many as told by your health care provider. Bathtub sitz bath To take a sitz bath in a bathtub: Partially fill a bathtub with warm water. The water should be deep enough to cover your hips and buttocks when you are sitting in the tub. Follow your health care provider's instructions if you are told to put medicine in the water. Sit in the water. Open the tub drain a little, and leave it open during your bath. Turn on the warm water again, enough to replace the water that is draining out. Keep the water running throughout your bath. This helps keep the water at the right level and temperature. Soak in the water for 15-20 minutes, or as long as told by your health care provider. When you are done, be careful when you stand up. You may feel dizzy. After the sitz bath, pat yourself dry. Do not rub your skin to dry it.  Over-the-toilet sitz bath To take a sitz bath with an over-the-toilet basin: Follow the manufacturer's instructions. Fill the basin with warm water. Follow your health care provider's instructions if you  were told to put medicine in the water. Sit on the seat. Make sure the water covers your buttocks and perineum. Soak in the water for 15-20 minutes, or as long as told by your health care provider. After the sitz bath, pat yourself dry. Do not rub your skin to dry it. Clean and dry the basin between uses. Discard the basin if it cracks, or according to the manufacturer's instructions.  Contact a health care provider if: Your pain or itching gets worse. Do not continue with sitz baths if your symptoms get worse. You have new symptoms. Do not continue with sitz baths until you talk with your health care provider. Summary A sitz bath is a warm water bath in which the water only comes up to your hips and covers your buttocks. A sitz bath may help relieve pain and discomfort after delivering a baby. It also may help with pain and itching from hemorrhoids or anal fissures, or pain after certain surgeries. It can also help to relax muscles that are sore or tight. Take 3-4 sitz baths a day, or as many as told by your health care provider. Soak in the water for 15-20 minutes. Do not continue with sitz baths if your symptoms get worse. This information is not intended to replace advice given to you by your health care provider. Make sure  you discuss any questions you have with your health care provider. Document Revised: 04/24/2020 Document Reviewed: 04/26/2020 Elsevier Patient Education  [place].  Hemorrhoids Hemorrhoids are swollen veins in and around the rectum or anus. There are two types of hemorrhoids: Internal hemorrhoids. These occur in the veins that are just inside the rectum. They may poke through to the outside and become irritated and painful. External hemorrhoids. These occur in the veins that are outside the anus and can be felt as a painful swelling or hard lump near the anus. Most hemorrhoids do not cause serious problems, and they can be managed with home treatments such as  diet and lifestyle changes. If home treatments do not help the symptoms, procedures can be done to shrink or remove the hemorrhoids. What are the causes? This condition is caused by increased pressure in the anal area. This pressure may result from various things, including: Constipation. Straining to have a bowel movement. Diarrhea. Pregnancy. Obesity. Sitting for long periods of time. Heavy lifting or other activity that causes you to strain. Anal sex. Riding a bike for a long period of time. What are the signs or symptoms? Symptoms of this condition include: Pain. Anal itching or irritation. Rectal bleeding. Leakage of stool (feces). Anal swelling. One or more lumps around the anus. How is this diagnosed? This condition can often be diagnosed through a visual exam. Other exams or tests may also be done, such as: An exam that involves feeling the rectal area with a gloved hand (digital rectal exam). An exam of the anal canal that is done using a small tube (anoscope). A blood test, if you have lost a significant amount of blood. A test to look inside the colon using a flexible tube with a camera on the end (sigmoidoscopy or colonoscopy). How is this treated? This condition can usually be treated at home. However, various procedures may be done if dietary changes, lifestyle changes, and other home treatments do not help your symptoms. These procedures can help make the hemorrhoids smaller or remove them completely. Some of these procedures involve surgery, and others do not. Common procedures include: Rubber band ligation. Rubber bands are placed at the base of the hemorrhoids to cut off their blood supply. Sclerotherapy. Medicine is injected into the hemorrhoids to shrink them. Infrared coagulation. A type of light energy is used to get rid of the hemorrhoids. Hemorrhoidectomy surgery. The hemorrhoids are surgically removed, and the veins that supply them are tied off. Stapled  hemorrhoidopexy surgery. The surgeon staples the base of the hemorrhoid to the rectal wall. Follow these instructions at home: Eating and drinking  Eat foods that have a lot of fiber in them, such as whole grains, beans, nuts, fruits, and vegetables. Ask your health care provider about taking products that have added fiber (fiber supplements). Reduce the amount of fat in your diet. You can do this by eating low-fat dairy products, eating less red meat, and avoiding processed foods. Drink enough fluid to keep your urine pale yellow. Managing pain and swelling  Take warm sitz baths for 20 minutes, 3-4 times a day to ease pain and discomfort. You may do this in a bathtub or using a portable sitz bath that fits over the toilet. If directed, apply ice to the affected area. Using ice packs between sitz baths may be helpful. Put ice in a plastic bag. Place a towel between your skin and the bag. Leave the ice on for 20 minutes, 2-3 times a  day. General instructions Take over-the-counter and prescription medicines only as told by your health care provider. Use medicated creams or suppositories as told. Get regular exercise. Ask your health care provider how much and what kind of exercise is best for you. In general, you should do moderate exercise for at least 30 minutes on most days of the week (150 minutes each week). This can include activities such as walking, biking, or yoga. Go to the bathroom when you have the urge to have a bowel movement. Do not wait. Avoid straining to have bowel movements. Keep the anal area dry and clean. Use wet toilet paper or moist towelettes after a bowel movement. Do not sit on the toilet for long periods of time. This increases blood pooling and pain. Keep all follow-up visits as told by your health care provider. This is important. Contact a health care provider if you have: Increasing pain and swelling that are not controlled by treatment or medicine. Difficulty  having a bowel movement, or you are unable to have a bowel movement. Pain or inflammation outside the area of the hemorrhoids. Get help right away if you have: Uncontrolled bleeding from your rectum. Summary Hemorrhoids are swollen veins in and around the rectum or anus. Most hemorrhoids can be managed with home treatments such as diet and lifestyle changes. Taking warm sitz baths can help ease pain and discomfort. In severe cases, procedures or surgery can be done to shrink or remove the hemorrhoids. This information is not intended to replace advice given to you by your health care provider. Make sure you discuss any questions you have with your health care provider. Document Revised: 02/20/2021 Document Reviewed: 02/20/2021 Elsevier Patient Education  [place].

## 2022-01-17 NOTE — Progress Notes (Signed)
Chief Complaint  Patient presents with   Hemorrhoids    Ongoing for few days. Bleeding is minute, pt is on blood thinner, burning and soreness in rectal area.    F/u with son  H/o hemorrhoids and c/o bleeding on tissue for a few days and years ago blood minute but he is also on blood thinners having burning soreness in rectum tried prep H otc   Refill of lipitor needed    Review of Systems  Gastrointestinal:  Positive for blood in stool.  Past Medical History:  Diagnosis Date   Arthritis    CAD (coronary artery disease)    a. s/p 6-V CABG in [place] in 1995; b. MV 4/12: mild intensity, fixed perfusion defect involving the basal to mid anterior wall consistent with injury to the diagonal distribution (this finding was also noted on prior study dating back to 2008). Overall, no ischemia was noted and there was no high-risk prognostic indicators based on this study. EF 63%   Carotid artery disease ([place])    a. ultrasound 12/16: 1-39% bilateral ICA stenosis   Chronic atrial fibrillation ([place]) 2000   a. CHADS2VASc => 4 (HTN, age x 2, vascular disease); b. on Coumadin [facility]   Chronic back pain    Cognitive impairment    Colon polyps    Diastolic dysfunction    a. 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[place])    Past Surgical History:  Procedure Laterality Date   CARDIAC CATHETERIZATION     CORONARY ARTERY BYPASS GRAFT  1995   CABG x 6 in [place], [place]     bilateral    ROTATOR CUFF REPAIR      right   TONSILLECTOMY AND ADENOIDECTOMY  1943   Family History  Problem Relation Age of Onset   Heart disease Father    Heart attack Father 35       MI   Alcohol abuse Father    Hyperlipidemia Father    Hypertension Father    Heart attack Paternal Grandfather 20   Alcohol abuse Paternal Grandfather    Cancer Mother        Breast cancer and Lung Cancer   Diabetes Brother    Dementia Brother    Crohn's disease Daughter    Cancer Brother        lung cancer   Alcohol abuse Son    Alcohol abuse Maternal Grandfather    Alcohol abuse Son    Heart attack Paternal Grandmother 30   Social History   Socioeconomic History   Marital status: Divorced    Spouse name: Not on file   Number of children: 7   Years of education: 12   Highest education level: Not on file  Occupational History   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 Hetzer grew up in [place], [place]. Most recently he was living in [place]. He moved to the [place] area since March 2015. Recently divorced and moved to this area to be close to one of his sons. He enjoys reading and has been going to the gym to exercise.      ives at home with his son, Ed. He has 4 sons. Jadd Gasior is his POA.   He is able to complete his ADLs except needs some assist at intervals from Ed to put his shoes on. ED completes the iADLs like preparing food, cleaning the home, bills, errands and provides transportation to medical appointments. T   Social Determinants of Health   Financial Resource Strain: Low Risk    Difficulty of Paying Living Expenses: Not hard at all  Food Insecurity: No Food Insecurity   Worried About Paediatric nurse in the Last Year: Never true   Ran Out of Food in the Last Year: Never true  Transportation Needs: No Transportation Needs   Lack of Transportation (Medical): No   Lack of Transportation (Non-Medical): No  Physical Activity: Sufficiently Active   Days of Exercise per Week: 5 days   Minutes of Exercise per Session: 30 min  Stress: No Stress Concern Present   Feeling of Stress : Not at all  Social Connections: Moderately Integrated   Frequency of Communication with Friends and Family: Three times a week   Frequency of Social Gatherings with Friends and Family: Three times a week   Attends Religious Services: 1 to 4 times per year   Active Member of Clubs or Organizations: Yes   Attends Archivist Meetings: 1 to 4 times per year   Marital Status: Divorced  Human resources officer Violence: Not At Risk   Fear of Current or Ex-Partner: No   Emotionally Abused: No   Physically Abused: No   Sexually Abused: No   Current Meds  Medication Sig   allopurinol (ZYLOPRIM) 300 MG tablet TAKE 1 TABLET ON MONDAY, WEDNESDAY AND FRIDAY   Artificial Tear Ointment (DRY EYES OP) Apply 1 drop to eye 3 (three) times daily.   donepezil (ARICEPT) 5 MG tablet Take 2.5 mg by mouth at bedtime.   furosemide (LASIX) 20 MG tablet TAKE 1 TABLET BY MOUTH EVERY OTHER DAY FOR FLUID RETENTION   hydrocortisone (ANUSOL-HC) 2.5 % rectal cream Place 1 application. rectally 3 (three) times daily. Prn   metoprolol tartrate (LOPRESSOR) 50 MG tablet TAKE 1 TABLET(50 MG) BY MOUTH TWICE DAILY   ofloxacin (OCUFLOX) 0.3 % ophthalmic solution Place 1 drop into the right eye 4 (four) times daily.   prednisoLONE acetate (PRED FORTE) 1 % ophthalmic suspension PLEASE SEE ATTACHED FOR DETAILED DIRECTIONS   warfarin (COUMADIN) 5 MG tablet 1 TAB DAILY AT 6 PM. ALL DAYS EXCEPT FOR MONDAY AND FRIDAY. MONDAY AND FRIDAY PT TAKES 2.5 MG.   [DISCONTINUED] atorvastatin (LIPITOR) 40 MG tablet TAKE 1 TABLET BY MOUTH EVERY DAY   No  Known Allergies Recent Results (from the past 2160 hour(s))  CBC     Status: Abnormal   Collection Time: 10/21/21  2:19 PM  Result Value Ref Range   WBC 3.5 (L) 4.0 - 10.5 K/uL   RBC 4.45 4.22 - 5.81 MIL/uL   Hemoglobin 14.1  13.0 - 17.0 g/dL   HCT 43.7 39.0 - 52.0 %   MCV 98.2 80.0 - 100.0 fL   MCH 31.7 26.0 - 34.0 pg   MCHC 32.3 30.0 - 36.0 g/dL   RDW 14.3 11.5 - 15.5 %   Platelets 89 (L) 150 - 400 K/uL    Comment: Immature Platelet Fraction may be clinically indicated, consider ordering this additional test [id number]    nRBC 0.0 0.0 - 0.2 %    Comment: Performed at [facility], [address], [place], [place] [address]  Basic metabolic panel     Status: Abnormal   Collection Time: 10/21/21  2:19 PM  Result Value Ref Range   Sodium 139 135 - 145 mmol/L   Potassium 3.9 3.5 - 5.1 mmol/L   Chloride 104 98 - 111 mmol/L   CO2 27 22 - 32 mmol/L   Glucose, Bld 118 (H) 70 - 99 mg/dL    Comment: Glucose reference range applies only to samples taken after fasting for at least 8 hours.   BUN 22 8 - 23 mg/dL   Creatinine, Ser 1.10 0.61 - 1.24 mg/dL   Calcium 9.2 8.9 - 10.3 mg/dL   GFR, Estimated >60 >60 mL/min    Comment: (NOTE) Calculated using the CKD-EPI Creatinine Equation (2021)    Anion gap 8 5 - 15    Comment: Performed at [facility], [address] [place], [place] [address]  Protime-INR     Status: Abnormal   Collection Time: 10/21/21  2:19 PM  Result Value Ref Range   Prothrombin Time 26.7 (H) 11.4 - 15.2 seconds   INR 2.5 (H) 0.8 - 1.2    Comment: (NOTE) INR goal varies based on device and disease states. Performed at [facility], [address] [place], [place] [address]   Hepatic function panel     Status: Abnormal   Collection Time: 10/21/21  2:19 PM  Result Value Ref Range   Total Protein 6.7 6.5 - 8.1 g/dL   Albumin 3.8 3.5 - 5.0 g/dL   AST 21 15 - 41 U/L   ALT 22 0 - 44 U/L   Alkaline Phosphatase 106 38 - 126 U/L    Total Bilirubin 1.9 (H) 0.3 - 1.2 mg/dL   Bilirubin, Direct 0.3 (H) 0.0 - 0.2 mg/dL   Indirect Bilirubin 1.6 (H) 0.3 - 0.9 mg/dL    Comment: Performed at [facility], [address] [place], [place] [address]  POCT INR     Status: None   Collection Time: 11/06/21 11:02 AM  Result Value Ref Range   INR 2.0 2.0 - 3.0  POCT INR     Status: None   Collection Time: 12/18/21  3:21 PM  Result Value Ref Range   INR 2.6 2.0 - 3.0  POCT occult blood stool     Status: Abnormal   Collection Time: 01/17/22  3:21 PM  Result Value Ref Range   Fecal Occult Blood, POC Positive (A) Negative   Card #1 Date     Card #2 Fecal Occult Blod, POC     Card #2 Date     Card #3 Fecal Occult Blood, POC     Card #3 Date     Objective  Body mass index is 27.83 kg/m. Wt Readings from Last 3 Encounters:  01/17/22 183 lb (83 kg)  12/17/21 190 lb 0.6 oz (86.2 kg)  10/21/21 190 lb 0.6 oz (86.2 kg)   Temp Readings from  Last 3 Encounters:  01/17/22 97.7 F (36.5 C) (Oral)  12/17/21 97.6 F (36.4 C) (Oral)  10/21/21 98.4 F (36.9 C) (Oral)   BP Readings from Last 3 Encounters:  01/17/22 110/70  12/17/21 140/70  10/21/21 111/68   Pulse Readings from Last 3 Encounters:  01/17/22 75  12/17/21 68  10/21/21 75    Physical Exam Vitals and nursing note reviewed.  Constitutional:      Appearance: Normal appearance. He is well-developed and well-groomed.  HENT:     Head: Normocephalic and atraumatic.  Eyes:     Conjunctiva/sclera: Conjunctivae normal.     Pupils: Pupils are equal, round, and reactive to light.  Cardiovascular:     Rate and Rhythm: Normal rate and regular rhythm.     Heart sounds: Normal heart sounds.  Pulmonary:     Effort: Pulmonary effort is normal. No respiratory distress.     Breath sounds: Normal breath sounds.  Abdominal:     Tenderness: There is no abdominal tenderness.  Genitourinary:    Rectum: Guaiac result positive. Tenderness and external hemorrhoid  present.     Comments: ? Anal tear Skin:    General: Skin is warm and moist.  Neurological:     General: No focal deficit present.     Mental Status: He is alert and oriented to person, place, and time. Mental status is at baseline.     Motor: Motor function is intact.     Coordination: Coordination is intact.     Gait: Gait is intact. Gait normal.     Comments: Bl walks with cane  Psychiatric:        Attention and Perception: Attention and perception normal.        Mood and Affect: Mood and affect normal.        Speech: Speech normal.        Behavior: Behavior normal. Behavior is cooperative.        Thought Content: Thought content normal.        Cognition and Memory: Cognition and memory normal.        Judgment: Judgment normal.    Assessment  Plan  Blood in stool ?anal tear, rectal prolapse, hemorrhoids - Plan: POCT occult blood stool, Ambulatory referral to Gastroenterology  Hemorrhoids, unspecified hemorrhoid type - Plan: hydrocortisone (ANUSOL-HC) 2.5 % rectal cream, Ambulatory referral to Gastroenterology Cont prep H   Hyperlipidemia, unspecified hyperlipidemia type - Plan: atorvastatin (LIPITOR) 40 MG tablet  Provider: Dr. Olivia Mackie McLean-Scocuzza-Internal Medicine

## 2022-01-18 ENCOUNTER — Other Ambulatory Visit: Payer: Self-pay | Admitting: Internal Medicine

## 2022-01-29 ENCOUNTER — Ambulatory Visit (INDEPENDENT_AMBULATORY_CARE_PROVIDER_SITE_OTHER): Payer: Medicare Other

## 2022-01-29 DIAGNOSIS — I4819 Other persistent atrial fibrillation: Secondary | ICD-10-CM | POA: Diagnosis not present

## 2022-01-29 DIAGNOSIS — Z5181 Encounter for therapeutic drug level monitoring: Secondary | ICD-10-CM

## 2022-01-29 LAB — POCT INR: INR: 2.6 (ref 2.0–3.0)

## 2022-01-29 NOTE — Patient Instructions (Signed)
-   continue taking warfarin 1 tablet daily except for 1/2 a tablet on WEDNESDAYS - Recheck INR in 6 weeks

## 2022-03-11 DIAGNOSIS — R0602 Shortness of breath: Secondary | ICD-10-CM | POA: Diagnosis not present

## 2022-03-11 DIAGNOSIS — G4733 Obstructive sleep apnea (adult) (pediatric): Secondary | ICD-10-CM | POA: Diagnosis not present

## 2022-03-12 ENCOUNTER — Ambulatory Visit (INDEPENDENT_AMBULATORY_CARE_PROVIDER_SITE_OTHER): Payer: Medicare Other

## 2022-03-12 DIAGNOSIS — Z5181 Encounter for therapeutic drug level monitoring: Secondary | ICD-10-CM | POA: Diagnosis not present

## 2022-03-12 DIAGNOSIS — R0602 Shortness of breath: Secondary | ICD-10-CM | POA: Diagnosis not present

## 2022-03-12 DIAGNOSIS — I4819 Other persistent atrial fibrillation: Secondary | ICD-10-CM | POA: Diagnosis not present

## 2022-03-12 DIAGNOSIS — G4733 Obstructive sleep apnea (adult) (pediatric): Secondary | ICD-10-CM | POA: Diagnosis not present

## 2022-03-12 LAB — POCT INR: INR: 3 (ref 2.0–3.0)

## 2022-03-12 NOTE — Patient Instructions (Signed)
-   continue taking warfarin 1 tablet daily except for 1/2 a tablet on WEDNESDAYS - Recheck INR in 6 weeks

## 2022-03-22 ENCOUNTER — Other Ambulatory Visit: Payer: Self-pay | Admitting: Internal Medicine

## 2022-04-07 ENCOUNTER — Encounter: Payer: Self-pay | Admitting: Internal Medicine

## 2022-04-07 ENCOUNTER — Observation Stay
Admission: EM | Admit: 2022-04-07 | Discharge: 2022-04-08 | Disposition: A | Discharge status: Home Health | Payer: Medicare Other | Attending: Internal Medicine | Admitting: Internal Medicine

## 2022-04-07 ENCOUNTER — Emergency Department: Payer: Medicare Other

## 2022-04-07 ENCOUNTER — Encounter: Payer: Self-pay | Admitting: Emergency Medicine

## 2022-04-07 ENCOUNTER — Other Ambulatory Visit: Payer: Self-pay

## 2022-04-07 DIAGNOSIS — G3184 Mild cognitive impairment, so stated: Secondary | ICD-10-CM | POA: Diagnosis not present

## 2022-04-07 DIAGNOSIS — S79911A Unspecified injury of right hip, initial encounter: Secondary | ICD-10-CM | POA: Diagnosis not present

## 2022-04-07 DIAGNOSIS — Z951 Presence of aortocoronary bypass graft: Secondary | ICD-10-CM | POA: Diagnosis not present

## 2022-04-07 DIAGNOSIS — Z7901 Long term (current) use of anticoagulants: Secondary | ICD-10-CM | POA: Diagnosis not present

## 2022-04-07 DIAGNOSIS — I2510 Atherosclerotic heart disease of native coronary artery without angina pectoris: Secondary | ICD-10-CM | POA: Insufficient documentation

## 2022-04-07 DIAGNOSIS — Y92003 Bedroom of unspecified non-institutional (private) residence as the place of occurrence of the external cause: Secondary | ICD-10-CM | POA: Diagnosis not present

## 2022-04-07 DIAGNOSIS — F409 Phobic anxiety disorder, unspecified: Secondary | ICD-10-CM | POA: Diagnosis present

## 2022-04-07 DIAGNOSIS — F0390 Unspecified dementia without behavioral disturbance: Secondary | ICD-10-CM | POA: Diagnosis not present

## 2022-04-07 DIAGNOSIS — E785 Hyperlipidemia, unspecified: Secondary | ICD-10-CM | POA: Diagnosis present

## 2022-04-07 DIAGNOSIS — S4991XA Unspecified injury of right shoulder and upper arm, initial encounter: Secondary | ICD-10-CM | POA: Diagnosis not present

## 2022-04-07 DIAGNOSIS — F5105 Insomnia due to other mental disorder: Secondary | ICD-10-CM | POA: Insufficient documentation

## 2022-04-07 DIAGNOSIS — Z79899 Other long term (current) drug therapy: Secondary | ICD-10-CM | POA: Diagnosis not present

## 2022-04-07 DIAGNOSIS — M47816 Spondylosis without myelopathy or radiculopathy, lumbar region: Secondary | ICD-10-CM | POA: Diagnosis not present

## 2022-04-07 DIAGNOSIS — G4733 Obstructive sleep apnea (adult) (pediatric): Secondary | ICD-10-CM | POA: Diagnosis not present

## 2022-04-07 DIAGNOSIS — N179 Acute kidney failure, unspecified: Secondary | ICD-10-CM | POA: Diagnosis not present

## 2022-04-07 DIAGNOSIS — S40011A Contusion of right shoulder, initial encounter: Secondary | ICD-10-CM | POA: Diagnosis not present

## 2022-04-07 DIAGNOSIS — S3991XA Unspecified injury of abdomen, initial encounter: Secondary | ICD-10-CM | POA: Diagnosis not present

## 2022-04-07 DIAGNOSIS — E6609 Other obesity due to excess calories: Secondary | ICD-10-CM | POA: Diagnosis not present

## 2022-04-07 DIAGNOSIS — M19011 Primary osteoarthritis, right shoulder: Secondary | ICD-10-CM | POA: Diagnosis not present

## 2022-04-07 DIAGNOSIS — M25551 Pain in right hip: Secondary | ICD-10-CM | POA: Diagnosis not present

## 2022-04-07 DIAGNOSIS — I10 Essential (primary) hypertension: Secondary | ICD-10-CM | POA: Diagnosis not present

## 2022-04-07 DIAGNOSIS — I482 Chronic atrial fibrillation, unspecified: Secondary | ICD-10-CM | POA: Insufficient documentation

## 2022-04-07 DIAGNOSIS — Z9889 Other specified postprocedural states: Secondary | ICD-10-CM | POA: Diagnosis not present

## 2022-04-07 DIAGNOSIS — W182XXA Fall in (into) shower or empty bathtub, initial encounter: Secondary | ICD-10-CM | POA: Insufficient documentation

## 2022-04-07 DIAGNOSIS — S2249XA Multiple fractures of ribs, unspecified side, initial encounter for closed fracture: Secondary | ICD-10-CM | POA: Diagnosis present

## 2022-04-07 DIAGNOSIS — E669 Obesity, unspecified: Secondary | ICD-10-CM | POA: Diagnosis present

## 2022-04-07 DIAGNOSIS — I700 Atherosclerosis of aorta: Secondary | ICD-10-CM | POA: Diagnosis not present

## 2022-04-07 DIAGNOSIS — S2239XA Fracture of one rib, unspecified side, initial encounter for closed fracture: Secondary | ICD-10-CM | POA: Diagnosis present

## 2022-04-07 DIAGNOSIS — N400 Enlarged prostate without lower urinary tract symptoms: Secondary | ICD-10-CM | POA: Diagnosis not present

## 2022-04-07 DIAGNOSIS — M25572 Pain in left ankle and joints of left foot: Secondary | ICD-10-CM | POA: Diagnosis not present

## 2022-04-07 DIAGNOSIS — S7001XA Contusion of right hip, initial encounter: Secondary | ICD-10-CM | POA: Diagnosis not present

## 2022-04-07 DIAGNOSIS — K5730 Diverticulosis of large intestine without perforation or abscess without bleeding: Secondary | ICD-10-CM | POA: Diagnosis not present

## 2022-04-07 DIAGNOSIS — S2241XA Multiple fractures of ribs, right side, initial encounter for closed fracture: Secondary | ICD-10-CM | POA: Diagnosis not present

## 2022-04-07 DIAGNOSIS — K4020 Bilateral inguinal hernia, without obstruction or gangrene, not specified as recurrent: Secondary | ICD-10-CM | POA: Diagnosis not present

## 2022-04-07 DIAGNOSIS — D696 Thrombocytopenia, unspecified: Secondary | ICD-10-CM | POA: Diagnosis not present

## 2022-04-07 DIAGNOSIS — J9811 Atelectasis: Secondary | ICD-10-CM | POA: Diagnosis not present

## 2022-04-07 DIAGNOSIS — N289 Disorder of kidney and ureter, unspecified: Secondary | ICD-10-CM | POA: Insufficient documentation

## 2022-04-07 DIAGNOSIS — W19XXXA Unspecified fall, initial encounter: Secondary | ICD-10-CM

## 2022-04-07 DIAGNOSIS — E782 Mixed hyperlipidemia: Secondary | ICD-10-CM

## 2022-04-07 DIAGNOSIS — Z6832 Body mass index (BMI) 32.0-32.9, adult: Secondary | ICD-10-CM

## 2022-04-07 DIAGNOSIS — R0781 Pleurodynia: Secondary | ICD-10-CM | POA: Diagnosis present

## 2022-04-07 LAB — PROTIME-INR
INR: 2.9 — ABNORMAL HIGH (ref 0.8–1.2)
Prothrombin Time: 30.4 seconds — ABNORMAL HIGH (ref 11.4–15.2)

## 2022-04-07 LAB — COMPREHENSIVE METABOLIC PANEL
ALT: 49 U/L — ABNORMAL HIGH (ref 0–44)
AST: 95 U/L — ABNORMAL HIGH (ref 15–41)
Albumin: 4.3 g/dL (ref 3.5–5.0)
Alkaline Phosphatase: 105 U/L (ref 38–126)
Anion gap: 13 (ref 5–15)
BUN: 33 mg/dL — ABNORMAL HIGH (ref 8–23)
CO2: 21 mmol/L — ABNORMAL LOW (ref 22–32)
Calcium: 9.4 mg/dL (ref 8.9–10.3)
Chloride: 105 mmol/L (ref 98–111)
Creatinine, Ser: 2.28 mg/dL — ABNORMAL HIGH (ref 0.61–1.24)
GFR, Estimated: 27 mL/min — ABNORMAL LOW (ref >60)
Glucose, Bld: 194 mg/dL — ABNORMAL HIGH (ref 70–99)
Potassium: 3.9 mmol/L (ref 3.5–5.1)
Sodium: 139 mmol/L (ref 135–145)
Total Bilirubin: 1.9 mg/dL — ABNORMAL HIGH (ref 0.3–1.2)
Total Protein: 7 g/dL (ref 6.5–8.1)

## 2022-04-07 LAB — CBC
HCT: 45.2 % (ref 39.0–52.0)
Hemoglobin: 14.5 g/dL (ref 13.0–17.0)
MCH: 32 pg (ref 26.0–34.0)
MCHC: 32.1 g/dL (ref 30.0–36.0)
MCV: 99.8 fL (ref 80.0–100.0)
Platelets: 76 10*3/uL — ABNORMAL LOW (ref 150–400)
RBC: 4.53 MIL/uL (ref 4.22–5.81)
RDW: 14.5 % (ref 11.5–15.5)
WBC: 9.7 10*3/uL (ref 4.0–10.5)
nRBC: 0 % (ref 0.0–0.2)

## 2022-04-07 MED ORDER — MELATONIN 5 MG PO TABS
5.0000 mg | ORAL_TABLET | Freq: Every evening | ORAL | Status: DC | PRN
Start: 2022-04-07 — End: 2022-04-08

## 2022-04-07 MED ORDER — LACTATED RINGERS IV SOLN: INTRAVENOUS | Status: DC

## 2022-04-07 MED ORDER — ACETAMINOPHEN 650 MG RE SUPP: 650.0000 mg | Freq: Four times a day (QID) | RECTAL | Status: DC | PRN

## 2022-04-07 MED ORDER — DONEPEZIL HCL 5 MG PO TABS
2.5000 mg | ORAL_TABLET | Freq: Every day | ORAL | Status: DC
  Administered 2022-04-07: 2.5 mg via ORAL
  Filled 2022-04-07: qty 1

## 2022-04-07 MED ORDER — ONDANSETRON HCL 4 MG PO TABS: 4.0000 mg | ORAL_TABLET | Freq: Four times a day (QID) | ORAL | Status: DC | PRN

## 2022-04-07 MED ORDER — ONDANSETRON HCL 4 MG/2ML IJ SOLN: 4.0000 mg | Freq: Four times a day (QID) | INTRAMUSCULAR | Status: DC | PRN

## 2022-04-07 MED ORDER — SODIUM CHLORIDE 0.9 % IV BOLUS
1000.0000 mL | Freq: Once | INTRAVENOUS | Status: AC
  Administered 2022-04-07: 1000 mL via INTRAVENOUS

## 2022-04-07 MED ORDER — SENNOSIDES-DOCUSATE SODIUM 8.6-50 MG PO TABS: 1.0000 | ORAL_TABLET | Freq: Every evening | ORAL | Status: DC | PRN

## 2022-04-07 MED ORDER — WARFARIN SODIUM 6 MG PO TABS: 6.0000 mg | ORAL_TABLET | ORAL | Status: DC

## 2022-04-07 MED ORDER — WARFARIN - PHARMACIST DOSING INPATIENT
Freq: Every day | Status: DC
  Filled 2022-04-07: qty 1

## 2022-04-07 MED ORDER — ATORVASTATIN CALCIUM 20 MG PO TABS
40.0000 mg | ORAL_TABLET | Freq: Every day | ORAL | Status: DC
  Administered 2022-04-07: 40 mg via ORAL
  Filled 2022-04-07: qty 2

## 2022-04-07 MED ORDER — ACETAMINOPHEN 500 MG PO TABS
1000.0000 mg | ORAL_TABLET | Freq: Once | ORAL | Status: AC
  Administered 2022-04-07: 1000 mg via ORAL
  Filled 2022-04-07: qty 2

## 2022-04-07 MED ORDER — ACETAMINOPHEN 325 MG PO TABS: 650.0000 mg | ORAL_TABLET | Freq: Four times a day (QID) | ORAL | Status: DC | PRN

## 2022-04-07 MED ORDER — WARFARIN SODIUM 2.5 MG PO TABS
2.5000 mg | ORAL_TABLET | ORAL | Status: DC
  Administered 2022-04-08: 2.5 mg via ORAL
  Filled 2022-04-07 (×2): qty 1

## 2022-04-07 MED ORDER — ACETAMINOPHEN 500 MG PO TABS
1000.0000 mg | ORAL_TABLET | Freq: Four times a day (QID) | ORAL | Status: DC | PRN
  Administered 2022-04-08: 1000 mg via ORAL
  Filled 2022-04-07: qty 2

## 2022-04-07 MED ORDER — METOPROLOL TARTRATE 50 MG PO TABS
50.0000 mg | ORAL_TABLET | Freq: Two times a day (BID) | ORAL | Status: DC
  Administered 2022-04-07 – 2022-04-08 (×2): 50 mg via ORAL
  Filled 2022-04-07: qty 2
  Filled 2022-04-07: qty 1

## 2022-04-07 NOTE — H&P (Signed)
History and Physical   Jeffery Reynolds CWC:[id number] DOB: [birth year] DOA: 04/07/2022  PCP: Crecencio Mc, MD  Patient coming from: home via EMS  I have personally briefly reviewed patient's old medical records in [facility].  Chief Concern: falling  HPI: Mr. Jeffery Reynolds is an 88 year old is hyperlipidemia, hypertension, atrial fibrillation, on warfarin, memory disturbances, who presents for chief concerns of fall with right side and right rib pain.  Initial vitals in the emergency department showed temperature of 98.7, respiration rate of 16, heart rate of 103, blood pressure 100/71, SPO2 of 100% on room air.   Serum sodium is 139, potassium 3.9, chloride 105, bicarb 21, BUN 33, serum creatinine of 2.28, GFR 27, nonfasting blood glucose 194, WBC 9.7, hemoglobin 14.5, platelets of 76  CT of the chest abdomen pelvis without contrast: Was read as fractures of the posterior lateral right eighth through 11th ribs.  With subsegmental atelectasis in the right lower lobe.  ED treatment: Acetaminophen 1 g p.o., sodium chloride 1 L bolus.  At bedside patient was able to tell me his name, states that his age is 88.  He states that the year is 2008.  He looks at his son, [name] and says that that is his brother.  He does not appear to be in acute distress at bedside.  He reports he fell today because he was laying in bed.  Per son, at bedside, [name], patient was getting into the shower without the supervision if his family and slipped and fell onto his right side.  Patient was endorsing right side and right rib pain at home.  Social history: He will set home with his son.  Son denies tobacco, EtOH, recreational drug use.  He is retired and formerly worked as a Theme park manager.  ROS: Unable to complete as patient has moderate to advanced dementia  ED Course: Discussed with emergency medicine provider, patient requiring hospitalization for chief concerns  AKI.  Assessment/Plan  Principal Problem:   Rib fracture Active Problems:   Hyperlipidemia   Essential hypertension   Thrombocytopenia (HCC)   OSA (obstructive sleep apnea)   Chronic atrial fibrillation (HCC)   Mild cognitive impairment   Obesity   Insomnia due to anxiety and fear   Anticoagulated on Coumadin   Dementia without behavioral disturbance (HCC)   AKI (acute kidney injury) ([place])   Assessment and Plan:  * Rib fracture - Right 8th-11th ribs presumed secondary to mechanical fall - Incentive spirometry, flutter valve - Family requesting to avoid narcotics - Acetaminophen 1000 mg p.o. every 6 hours as needed for mild pain, fever, headaches - Once patient's renal function improved, a.m. team to consider Toradol as an alternative pain medication - Fall precaution  AKI (acute kidney injury) ([place]) - Per family, patient has had appropriate p.o. intake and no decrease appetite other than day of admission - Avoid nephrotoxic agents - LR 125 mL/h, 1 day ordered - BMP in the a.m., if renal function does not improve recommend a.m. team to consider consulting nephrology  Anticoagulated on Coumadin - Warfarin per pharmacy ordered  Insomnia due to anxiety and fear - Melatonin 5 mg nightly as needed for sleep ordered - Patient's family requesting to avoid sedating medications if possible  Mild cognitive impairment - Resumed home donepezil 2.5 mg nightly  Chronic atrial fibrillation (HCC) - Warfarin per pharmacy ordered  Hyperlipidemia - Atorvastatin 40 mg daily resumed  Goals of care-treat AKI at this time and pain  control - If patient continues to remain weak, can consider PT and SNF placement  CODE STATUS-son states that he thinks patient has a DO NOT RESUSCITATE order however he is not 100% sure. - I discussed that we will keep patient full code at this time and son to bring in patient's goals of care wishes from home tomorrow  Chart reviewed.   DVT prophylaxis:  Warfarin per pharmacy Code Status: Full code Diet: Heart healthy/carb modified Family Communication: Updated son [name] at bedside Disposition Plan: Pending clinical course Consults called: None at this time Admission status: MedSurg, observation  Past Medical History:  Diagnosis Date   Arthritis    CAD (coronary artery disease)    a. s/p 6-V CABG in [place] in 1995; b. MV 4/12: mild intensity, fixed perfusion defect involving the basal to mid anterior wall consistent with injury to the diagonal distribution (this finding was also noted on prior study dating back to 2008). Overall, no ischemia was noted and there was no high-risk prognostic indicators based on this study. EF 63%   Carotid artery disease ([place])    a. ultrasound 12/16: 1-39% bilateral ICA stenosis   Chronic atrial fibrillation ([place]) 2000   a. CHADS2VASc => 4 (HTN, age x 2, vascular disease); b. on [facility]   Chronic back pain    Cognitive impairment    Colon polyps    Diastolic dysfunction    a. 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 He has never used smokeless tobacco. He reports that he does not currently use alcohol. He  reports that he does not use drugs.  No Known Allergies Family History  Problem Relation Age of Onset   Heart disease Father    Heart attack Father 43       MI   Alcohol abuse Father    Hyperlipidemia Father    Hypertension Father    Heart attack Paternal Grandfather 85   Alcohol abuse Paternal Grandfather    Cancer Mother        Breast cancer and Lung Cancer   Diabetes Brother    Dementia Brother    Crohn's disease Daughter    Cancer Brother        lung cancer   Alcohol abuse Son    Alcohol abuse Maternal Grandfather    Alcohol abuse Son    Heart attack Paternal Grandmother 12   Family history: Family history reviewed and pertinent dementia in brother.  Prior to Admission medications   Medication Sig Start Date End Date Taking? Authorizing Provider  allopurinol (ZYLOPRIM) 300  MG tablet TAKE 1 TABLET ON MONDAY, [facility] AND FRIDAY 12/02/21   Crecencio Mc, MD  Artificial Tear Ointment (DRY EYES OP) Apply 1 drop to eye 3 (three) times daily.    [provider]  atorvastatin (LIPITOR) 40 MG tablet Take 1 tablet (40 mg total) by mouth daily. After 6 pm 01/17/22   McLean-Scocuzza, Nino Glow, MD  donepezil (ARICEPT) 5 MG tablet Take 2.5 mg by mouth at bedtime. 03/08/21   [provider]  furosemide (LASIX) 20 MG tablet TAKE 1 TABLET BY MOUTH EVERY OTHER DAY FOR FLUID RETENTION 01/21/22   Crecencio Mc, MD  hydrocortisone (ANUSOL-HC) 2.5 % rectal cream Place 1 application. rectally 3 (three) times daily. Prn 01/17/22   McLean-Scocuzza, Nino Glow, MD  memantine (NAMENDA) 5 MG tablet Take by mouth. Patient not taking: Reported on 01/17/2022 09/16/21 09/16/22  [provider]  memantine (NAMENDA) 5 MG tablet Take 5 mg by mouth daily. Patient not taking: Reported on 01/17/2022 09/16/21   [provider]  metoprolol tartrate (LOPRESSOR) 50 MG tablet TAKE 1 TABLET BY MOUTH TWICE A DAY 03/22/22   Dutch Quint B, FNP  ofloxacin (OCUFLOX) 0.3 % ophthalmic solution  Place 1 drop into the right eye 4 (four) times daily. 08/31/21   [provider]  prednisoLONE acetate (PRED FORTE) 1 % ophthalmic suspension PLEASE SEE ATTACHED FOR DETAILED DIRECTIONS 08/31/21   [provider]  warfarin (COUMADIN) 5 MG tablet 1 TAB DAILY AT 6 PM. ALL DAYS EXCEPT FOR MONDAY AND FRIDAY. MONDAY AND FRIDAY PT TAKES 2.5 MG. 10/11/21   Crecencio Mc, MD   Physical Exam: Vitals:   04/07/22 1430 04/07/22 1515 04/07/22 1913  BP: 100/71  121/63  Pulse: (!) 103  74  Resp: 16  14  Temp: 98.7 F (37.1 C)    SpO2: 100%  97%  Weight:  86.2 kg   Height:  '5\' 8"'$  (1.727 m)    Constitutional: appears age-appropriate, frail, NAD, calm, comfortable Eyes: PERRL, lids and conjunctivae normal ENMT: Mucous membranes are moist. Posterior pharynx clear of any exudate or lesions. Age-appropriate dentition. Hearing appropriate Neck: normal, supple, no masses, no thyromegaly Respiratory: clear to auscultation bilaterally, no wheezing, no crackles. Normal respiratory effort. No accessory muscle use.  Cardiovascular: Regular rate and rhythm, no murmurs / rubs / gallops. No extremity edema. 2+ pedal pulses. No carotid bruits.  Abdomen: no tenderness, no masses palpated, no hepatosplenomegaly. Bowel sounds positive.  Musculoskeletal: no clubbing / cyanosis. No joint deformity upper and lower extremities. Good ROM, no contractures, no atrophy. Normal muscle tone.  Skin: No induration.  Right-sided anterior chest/rib area redness present on admission Neurologic: Sensation intact. Strength 5/5 in all 4.  Psychiatric: Lacks judgment and insight. Alert and oriented x self. Normal mood.   EKG: Not indicated at this time  Chest x-ray on Admission: I personally reviewed and I agree with radiologist reading as below.  CT CHEST ABDOMEN PELVIS WO CONTRAST  Result Date: 04/07/2022 CLINICAL DATA:  Poly trauma, blunt trauma, elevated creatinine EXAM: CT CHEST, ABDOMEN AND PELVIS WITHOUT CONTRAST  TECHNIQUE: Multidetector CT imaging of the chest, abdomen and pelvis was performed following the standard protocol without IV contrast. RADIATION DOSE REDUCTION: This exam was performed according to the departmental dose-optimization program which includes automated exposure control, adjustment of the mA and/or kV according to patient size and/or use of iterative reconstruction technique. COMPARISON:  CT angio chest 10/21/2021 FINDINGS: CT CHEST FINDINGS Cardiovascular: Atherosclerotic calcifications aorta, coronary arteries, and proximal  great vessels. Post CABG. Aorta upper normal caliber. No pericardial effusion. Mediastinum/Nodes: Base of cervical region unremarkable. Mediastinum clear without hemorrhage or mass. No thoracic adenopathy. Esophagus normal appearance. Lungs/Pleura: Subsegmental atelectasis in RIGHT lower lobe. Lungs otherwise clear. No pulmonary infiltrate, pleural effusion, or pneumothorax. Musculoskeletal: Minimally displaced fractures of the posterolateral RIGHT ninth and tenth ribs. Nondisplaced fracture posterolateral RIGHT eighth rib. CT ABDOMEN PELVIS FINDINGS Hepatobiliary: Gallbladder surgically absent.  Liver unremarkable. Pancreas: Normal appearance Spleen: Normal appearance Adrenals/Urinary Tract: Adrenal glands, kidneys, ureters, and bladder normal appearance Stomach/Bowel: Minimal sigmoid diverticulosis without evidence of diverticulitis. Stomach and bowel loops otherwise normal appearance. Normal appendix. Vascular/Lymphatic: Atherosclerotic calcifications aorta and iliac arteries without aneurysm. No adenopathy. Reproductive: Minimal prostatic enlargement Other: Small BILATERAL inguinal hernias containing fat. No free air or free fluid. Musculoskeletal: Osseous demineralization. Nondisplaced fracture of distal RIGHT eleventh rib. IMPRESSION: Fractures of posterolateral RIGHT eighth through eleventh ribs. Subsegmental atelectasis in RIGHT lower lobe. Minimal prostatic enlargement.  Small BILATERAL inguinal hernias containing fat. Minimal sigmoid diverticulosis. No acute intra-abdominal or intrapelvic abnormalities. Aortic Atherosclerosis (ICD10-I70.0). Electronically Signed   By: Lavonia Dana M.D.   On: 04/07/2022 18:54   DG Ribs Unilateral W/Chest Right  Result Date: 04/07/2022 CLINICAL DATA:  Trauma, fall EXAM: RIGHT RIBS AND CHEST - 3+ VIEW COMPARISON:  None Available. FINDINGS: There is nondisplaced fracture in the lateral end of right eleventh rib. The soft tissue prominence in the right lateral abdominal wall. Surgical clips are seen in right side of abdomen. There is previous coronary bypass surgery. IMPRESSION: Undisplaced fracture is seen in the lateral end of right eleventh rib. There is soft tissue thickening in the right lateral abdominal wall. This may be due to contusion or hematoma in the abdominal wall. If clinically warranted, follow-up CT may be considered. Electronically Signed   By: Elmer Picker M.D.   On: 04/07/2022 16:43   DG Hip Unilat W or Wo Pelvis 2-3 Views Right  Result Date: 04/07/2022 CLINICAL DATA:  Trauma, fall EXAM: DG HIP (WITH OR WITHOUT PELVIS) 2-3V RIGHT COMPARISON:  None Available. FINDINGS: No displaced fracture or dislocation is seen. Minimal bony spurs are seen. Arterial calcifications are seen in soft tissues. Marked degenerative changes are noted in the visualized lower lumbar spine. IMPRESSION: No fracture or dislocation is seen in right hip. Electronically Signed   By: Elmer Picker M.D.   On: 04/07/2022 16:40   DG Shoulder Right  Result Date: 04/07/2022 CLINICAL DATA:  Trauma, fall EXAM: RIGHT SHOULDER - 2+ VIEW COMPARISON:  None Available. FINDINGS: No displaced fracture or dislocation is seen. There is a metallic anchor in proximal humerus from previous surgery. Small bony spurs are noted. No abnormal soft tissue calcifications are seen. There is evidence of previous coronary bypass surgery. IMPRESSION: No recent fracture  or dislocation is seen in right shoulder. Postsurgical and degenerative changes are noted in right shoulder. Electronically Signed   By: Elmer Picker M.D.   On: 04/07/2022 16:39    Labs on Admission: I have personally reviewed following labs  CBC: Recent Labs  Lab 04/07/22 1738  WBC 9.7  HGB 14.5  HCT 45.2  MCV 99.8  PLT 76*   Basic Metabolic Panel: Recent Labs  Lab 04/07/22 1738  NA 139  K 3.9  CL 105  CO2 21*  GLUCOSE 194*  BUN 33*  CREATININE 2.28*  CALCIUM 9.4   GFR: Estimated Creatinine Clearance: 24.8 mL/min (A) (by C-G formula based on SCr of 2.28 mg/dL (H)).  Liver Function  Tests: Recent Labs  Lab 04/07/22 1738  AST 95*  ALT 49*  ALKPHOS 105  BILITOT 1.9*  PROT 7.0  ALBUMIN 4.3   Coagulation Profile: Recent Labs  Lab 04/07/22 1738  INR 2.9*   Urine analysis:    Component Value Date/Time   BILIRUBINUR negative 09/13/2021 1029   KETONESUR negative 09/13/2021 1029   UROBILINOGEN 0.2 09/13/2021 1029   NITRITE Negative 09/13/2021 1029   LEUKOCYTESUR Trace (A) 09/13/2021 1029   Dr. Tobie Poet [facility]  If 7PM-7AM, please contact overnight-coverage provider If 7AM-7PM, please contact day coverage provider [email or web address]  04/07/2022, 9:55 PM

## 2022-04-07 NOTE — Assessment & Plan Note (Signed)
-   Warfarin per pharmacy ordered

## 2022-04-07 NOTE — Assessment & Plan Note (Signed)
-   Atorvastatin 40 mg daily resumed 

## 2022-04-07 NOTE — Assessment & Plan Note (Addendum)
-   Per family, patient has had appropriate p.o. intake and no decrease appetite other than day of admission - Avoid nephrotoxic agents - LR 125 mL/h, 1 day ordered - BMP in the a.m., if renal function does not improve recommend a.m. team to consider consulting nephrology

## 2022-04-07 NOTE — Hospital Course (Signed)
Mr. Jeffery Reynolds is an 88 year old is hyperlipidemia, hypertension, atrial fibrillation, on warfarin, memory disturbances, who presents for chief concerns of fall with right side and right rib pain.  Initial vitals in the emergency department showed temperature of 98.7, respiration rate of 16, heart rate of 103, blood pressure 100/71, SPO2 of 100% on room air.   Serum sodium is 139, potassium 3.9, chloride 105, bicarb 21, BUN 33, serum creatinine of 2.28, GFR 27, nonfasting blood glucose 194, WBC 9.7, hemoglobin 14.5, platelets of 76  CT of the chest abdomen pelvis without contrast: Was read as fractures of the posterior lateral right eighth through 11th ribs.  With subsegmental atelectasis in the right lower lobe.  ED treatment: Acetaminophen 1 g p.o., sodium chloride 1 L bolus.  Patient remained hemodynamically stable.  Able to work with PT who recommended home health services which were ordered. Patient received some IV fluid with some improvement of his renal function but still above baseline.  He should be encouraged to keep himself well-hydrated.  He is being discharged on Norco and lidocaine patch to be used as needed for pain.  He should be encouraged to use spirometry regularly and need to have a close follow-up with his primary care provider and nephrologist.  We will continue rest of his home medications.  He was instructed to hold Lasix for few days and have his renal function checked before resuming.

## 2022-04-07 NOTE — Assessment & Plan Note (Signed)
-   Resumed home donepezil 2.5 mg nightly

## 2022-04-07 NOTE — ED Triage Notes (Signed)
Pt was in the shower and slipped falling onto his right side. Pt has dementia, does live with his son but got in the shower without waiting for family. Pt is co right shoulder, right rib area, right hip. Pt is alert and able to answer some questions appropriately.

## 2022-04-07 NOTE — Assessment & Plan Note (Addendum)
-   Melatonin 5 mg nightly as needed for sleep ordered - Patient's family requesting to avoid sedating medications if possible

## 2022-04-07 NOTE — Progress Notes (Signed)
ANTICOAGULATION CONSULT NOTE - Initial Consult  Pharmacy Consult for Warfarin  Indication: atrial fibrillation  No Known Allergies  Patient Measurements: Height: '5\' 8"'$  (172.7 cm) Weight: 86.2 kg (190 lb) IBW/kg (Calculated) : 68.4 Heparin Dosing Weight:   Vital Signs: Temp: 98.7 F (37.1 C) (08/14 1430) BP: 139/79 (08/14 2214) Pulse Rate: 71 (08/14 2214)  Labs: Recent Labs    04/07/22 1738  HGB 14.5  HCT 45.2  PLT 76*  LABPROT 30.4*  INR 2.9*  CREATININE 2.28*    Estimated Creatinine Clearance: 24.8 mL/min (A) (by C-G formula based on SCr of 2.28 mg/dL (H)).   Medical History: Past Medical History:  Diagnosis Date   Arthritis    CAD (coronary artery disease)    a. s/p 6-V CABG in [place] in 1995; b. MV 4/12: mild intensity, fixed perfusion defect involving the basal to mid anterior wall consistent with injury to the diagonal distribution (this finding was also noted on prior study dating back to 2008). Overall, no ischemia was noted and there was no high-risk prognostic indicators based on this study. EF 63%   Carotid artery disease ([place])    a. ultrasound 12/16: 1-39% bilateral ICA stenosis   Chronic atrial fibrillation ([place]) 2000   a. CHADS2VASc => 4 (HTN, age x 2, vascular disease); b. on Coumadin [facility]   Chronic back pain    Cognitive impairment    Colon polyps    Diastolic dysfunction    a. TTE 4/10: EF of 23%, mild diastolic dysfunction, no significant valvular abnormalities, mild ascending aortic root dilatation, moderate left atrial enlargement, technically difficult study   Genital warts    GERD (gastroesophageal reflux disease)    Gout    Controlled with Allopurinol   History of blood transfusion    History of chicken pox    Hyperlipidemia    Hypertension    Seasonal allergies    Sleep apnea, obstructive    Thrombocytopathia (HCC)     Medications:  (Not in a hospital admission)   Assessment: Pharmacy consulted to dose  warfarin for Afib in this 88 year old male admitted with rib fracture.   Pt was on Warfarin 5 mg PO Q Sat, Sun, Tues, Wed, Thurs and 2.5 mg PO Q Mon, Fri.  Last dose of warfarin on 8/13 @ 1800.  8/14:  INR @ 1738 = 2.9 (therapeutic)           Hgb = 14.5           Hct = 45.2   Goal of Therapy:  INR 2-3   Plan:  Will continue pt on home warfarin regimen to resume 8/14 @ ~ 2200: Warfarin 2.5 mg PO Q Mon, Fri to start 8/14 @ 2200. Warfarin 5 mg PO Q Tue, Wed, Thurs, Sat, Sun ordered. Will check INR daily.   [place] Lemaster D 04/07/2022,10:30 PM

## 2022-04-07 NOTE — ED Provider Notes (Signed)
[facility] EMERGENCY DEPARTMENT Provider Note   CSN: [id number] Arrival date & time: 04/07/22  1322     History  Chief Complaint  Patient presents with   Jeffery Reynolds is a 88 y.o. male with history of atrial fibrillation and coronary artery disease, hypertension presents to the emergency department for evaluation of mechanical fall that occurred earlier today prior to arrival.  Patient suffers from dementia and lives at home with son.  Patient accidentally had a bowel movement and was in the walk-in shower trying to clean himself off when he tripped and fell hitting his right ribs.  Patient presents with son, patient states he slipped in the shower landing on his right side, denies any head injury, LOC, nausea vomiting or headaches.  No neck pain.  He is mostly complaining of right rib pain, right hip and right shoulder pain.  Patient denies any numbness tingling or radicular symptoms.  Patient has a history of A-fib and is on Coumadin.  Currently no chest pain or shortness of breath.  HPI     Home Medications Prior to Admission medications   Medication Sig Start Date End Date Taking? Authorizing Provider  allopurinol (ZYLOPRIM) 300 MG tablet TAKE 1 TABLET ON MONDAY, [facility] AND FRIDAY 12/02/21   Crecencio Mc, MD  Artificial Tear Ointment (DRY EYES OP) Apply 1 drop to eye 3 (three) times daily.    [provider]  atorvastatin (LIPITOR) 40 MG tablet Take 1 tablet (40 mg total) by mouth daily. After 6 pm 01/17/22   McLean-Scocuzza, Nino Glow, MD  donepezil (ARICEPT) 5 MG tablet Take 2.5 mg by mouth at bedtime. 03/08/21   [provider]  furosemide (LASIX) 20 MG tablet TAKE 1 TABLET BY MOUTH EVERY OTHER DAY FOR FLUID RETENTION 01/21/22   Crecencio Mc, MD  hydrocortisone (ANUSOL-HC) 2.5 % rectal cream Place 1 application. rectally 3 (three) times daily. Prn 01/17/22   McLean-Scocuzza, Nino Glow, MD  memantine (NAMENDA) 5 MG tablet Take  by mouth. Patient not taking: Reported on 01/17/2022 09/16/21 09/16/22  [provider]  memantine (NAMENDA) 5 MG tablet Take 5 mg by mouth daily. Patient not taking: Reported on 01/17/2022 09/16/21   [provider]  metoprolol tartrate (LOPRESSOR) 50 MG tablet TAKE 1 TABLET BY MOUTH TWICE A DAY 03/22/22   Dutch Quint B, FNP  ofloxacin (OCUFLOX) 0.3 % ophthalmic solution Place 1 drop into the right eye 4 (four) times daily. 08/31/21   [provider]  prednisoLONE acetate (PRED FORTE) 1 % ophthalmic suspension PLEASE SEE ATTACHED FOR DETAILED DIRECTIONS 08/31/21   [provider]  warfarin (COUMADIN) 5 MG tablet 1 TAB DAILY AT 6 PM. ALL DAYS EXCEPT FOR MONDAY AND FRIDAY. MONDAY AND FRIDAY PT TAKES 2.5 MG. 10/11/21   Crecencio Mc, MD      Allergies    Patient has no known allergies.    Review of Systems   Review of Systems  Physical Exam Updated Vital Signs BP 121/63   Pulse 74   Temp 98.7 F (37.1 C)   Resp 14   Ht '5\' 8"'$  (1.727 m)   Wt 86.2 kg   SpO2 97%   BMI 28.89 kg/m  Physical Exam Constitutional:      Appearance: He is well-developed.  HENT:     Head: Normocephalic and atraumatic.  Eyes:     Conjunctiva/sclera: Conjunctivae normal.     Pupils: Pupils are equal, round, and reactive to  light.  Cardiovascular:     Rate and Rhythm: Normal rate.  Pulmonary:     Effort: Pulmonary effort is normal. No respiratory distress.  Abdominal:     General: Bowel sounds are normal.     Tenderness: There is no abdominal tenderness. There is right CVA tenderness. There is no guarding.  Musculoskeletal:        General: Normal range of motion.     Cervical back: Normal range of motion and neck supple. No rigidity or tenderness.     Comments: Patient tender along the right shoulder distal clavicle proximal humerus region with no significant swelling or bruising.  He has mild bruising to the elbow but no tenderness to palpation or pain with elbow range of  motion.  He is tender along the right distal ribs along the mid axillary line with no step-off, palpable swelling or hematoma.  Patient has full range of motion of both hips and knees with no discomfort.  He is able to actively straight leg raise both lower extremities.  Nontender along the cervical thoracic or lumbar spinous process.  Skin:    General: Skin is warm.     Findings: No rash.  Neurological:     General: No focal deficit present.     Mental Status: He is alert and oriented to person, place, and time. Mental status is at baseline.     Cranial Nerves: No cranial nerve deficit.     Motor: No weakness.     Gait: Gait normal.  Psychiatric:        Mood and Affect: Mood normal.        Behavior: Behavior normal.        Thought Content: Thought content normal.     ED Results / Procedures / Treatments   Labs (all labs ordered are listed, but only abnormal results are displayed) Labs Reviewed  CBC - Abnormal; Notable for the following components:      Result Value   Platelets 76 (*)    All other components within normal limits  COMPREHENSIVE METABOLIC PANEL - Abnormal; Notable for the following components:   CO2 21 (*)    Glucose, Bld 194 (*)    BUN 33 (*)    Creatinine, Ser 2.28 (*)    AST 95 (*)    ALT 49 (*)    Total Bilirubin 1.9 (*)    GFR, Estimated 27 (*)    All other components within normal limits  PROTIME-INR - Abnormal; Notable for the following components:   Prothrombin Time 30.4 (*)    INR 2.9 (*)    All other components within normal limits  URINALYSIS, ROUTINE W REFLEX MICROSCOPIC    EKG None  Radiology CT CHEST ABDOMEN PELVIS WO CONTRAST  Result Date: 04/07/2022 CLINICAL DATA:  Poly trauma, blunt trauma, elevated creatinine EXAM: CT CHEST, ABDOMEN AND PELVIS WITHOUT CONTRAST TECHNIQUE: Multidetector CT imaging of the chest, abdomen and pelvis was performed following the standard protocol without IV contrast. RADIATION DOSE REDUCTION: This exam was  performed according to the departmental dose-optimization program which includes automated exposure control, adjustment of the mA and/or kV according to patient size and/or use of iterative reconstruction technique. COMPARISON:  CT angio chest 10/21/2021 FINDINGS: CT CHEST FINDINGS Cardiovascular: Atherosclerotic calcifications aorta, coronary arteries, and proximal great vessels. Post CABG. Aorta upper normal caliber. No pericardial effusion. Mediastinum/Nodes: Base of cervical region unremarkable. Mediastinum clear without hemorrhage or mass. No thoracic adenopathy. Esophagus normal appearance. Lungs/Pleura: Subsegmental atelectasis in RIGHT lower  lobe. Lungs otherwise clear. No pulmonary infiltrate, pleural effusion, or pneumothorax. Musculoskeletal: Minimally displaced fractures of the posterolateral RIGHT ninth and tenth ribs. Nondisplaced fracture posterolateral RIGHT eighth rib. CT ABDOMEN PELVIS FINDINGS Hepatobiliary: Gallbladder surgically absent.  Liver unremarkable. Pancreas: Normal appearance Spleen: Normal appearance Adrenals/Urinary Tract: Adrenal glands, kidneys, ureters, and bladder normal appearance Stomach/Bowel: Minimal sigmoid diverticulosis without evidence of diverticulitis. Stomach and bowel loops otherwise normal appearance. Normal appendix. Vascular/Lymphatic: Atherosclerotic calcifications aorta and iliac arteries without aneurysm. No adenopathy. Reproductive: Minimal prostatic enlargement Other: Small BILATERAL inguinal hernias containing fat. No free air or free fluid. Musculoskeletal: Osseous demineralization. Nondisplaced fracture of distal RIGHT eleventh rib. IMPRESSION: Fractures of posterolateral RIGHT eighth through eleventh ribs. Subsegmental atelectasis in RIGHT lower lobe. Minimal prostatic enlargement. Small BILATERAL inguinal hernias containing fat. Minimal sigmoid diverticulosis. No acute intra-abdominal or intrapelvic abnormalities. Aortic Atherosclerosis (ICD10-I70.0).  Electronically Signed   By: Lavonia Dana M.D.   On: 04/07/2022 18:54   DG Ribs Unilateral W/Chest Right  Result Date: 04/07/2022 CLINICAL DATA:  Trauma, fall EXAM: RIGHT RIBS AND CHEST - 3+ VIEW COMPARISON:  None Available. FINDINGS: There is nondisplaced fracture in the lateral end of right eleventh rib. The soft tissue prominence in the right lateral abdominal wall. Surgical clips are seen in right side of abdomen. There is previous coronary bypass surgery. IMPRESSION: Undisplaced fracture is seen in the lateral end of right eleventh rib. There is soft tissue thickening in the right lateral abdominal wall. This may be due to contusion or hematoma in the abdominal wall. If clinically warranted, follow-up CT may be considered. Electronically Signed   By: Elmer Picker M.D.   On: 04/07/2022 16:43   DG Hip Unilat W or Wo Pelvis 2-3 Views Right  Result Date: 04/07/2022 CLINICAL DATA:  Trauma, fall EXAM: DG HIP (WITH OR WITHOUT PELVIS) 2-3V RIGHT COMPARISON:  None Available. FINDINGS: No displaced fracture or dislocation is seen. Minimal bony spurs are seen. Arterial calcifications are seen in soft tissues. Marked degenerative changes are noted in the visualized lower lumbar spine. IMPRESSION: No fracture or dislocation is seen in right hip. Electronically Signed   By: Elmer Picker M.D.   On: 04/07/2022 16:40   DG Shoulder Right  Result Date: 04/07/2022 CLINICAL DATA:  Trauma, fall EXAM: RIGHT SHOULDER - 2+ VIEW COMPARISON:  None Available. FINDINGS: No displaced fracture or dislocation is seen. There is a metallic anchor in proximal humerus from previous surgery. Small bony spurs are noted. No abnormal soft tissue calcifications are seen. There is evidence of previous coronary bypass surgery. IMPRESSION: No recent fracture or dislocation is seen in right shoulder. Postsurgical and degenerative changes are noted in right shoulder. Electronically Signed   By: Elmer Picker M.D.   On:  04/07/2022 16:39    Procedures Procedures   Medications Ordered in ED Medications  acetaminophen (TYLENOL) tablet 1,000 mg (1,000 mg Oral Given 04/07/22 1733)  sodium chloride 0.9 % bolus 1,000 mL (1,000 mLs Intravenous New Bag/Given 04/07/22 1912)    ED Course/ Medical Decision Making/ A&P                           Medical Decision Making Amount and/or Complexity of Data Reviewed Labs: ordered. Radiology: ordered.  Risk OTC drugs.  88 year old male with history of atrial fibrillation, hypertension, dementia presents with a mechanical fall in the shower, landed on his right side and mostly complains of right rib pain.  He was found to have  eighth through the 11th rib fracture.  No other fractures to his body.  No head injury LOC nausea vomiting or headache.  Patient's blood work did show acute renal insufficiency with creatinine of 2.28.  Patient's pain controlled with Tylenol, he is resting.  Will discuss case with hospitalist and request admission for multiple rib fractures and acute renal insufficiency.  Patient is currently receiving IV fluids and resting.  Pain control with Tylenol. Final Clinical Impression(s) / ED Diagnoses Final diagnoses:  Acute renal insufficiency  Fall, initial encounter  Contusion of right hip, initial encounter  Contusion of right shoulder, initial encounter  Closed fracture of multiple ribs of right side, initial encounter    Rx / DC Orders ED Discharge Orders     None         Renata Caprice 04/07/22 Aviva Kluver, MD 04/11/22 [phone]

## 2022-04-07 NOTE — Assessment & Plan Note (Addendum)
-   Right 8th-11th ribs presumed secondary to mechanical fall - Incentive spirometry, flutter valve - Family requesting to avoid narcotics - Acetaminophen 1000 mg p.o. every 6 hours as needed for mild pain, fever, headaches - Once patient's renal function improved, a.m. team to consider Toradol as an alternative pain medication - Fall precaution

## 2022-04-07 NOTE — ED Triage Notes (Signed)
First RN note:  Per EMS, Pt, from home, c/o R hip pain r/t slip and fall in shower this afternoon.  No shortening or rotation noted.  Pt is A&Ox4.  Pt is on blood thinner, but denies hitting head.    130/60 85HR 98% RA 16 RR

## 2022-04-08 DIAGNOSIS — N289 Disorder of kidney and ureter, unspecified: Secondary | ICD-10-CM | POA: Diagnosis not present

## 2022-04-08 DIAGNOSIS — S2241XA Multiple fractures of ribs, right side, initial encounter for closed fracture: Secondary | ICD-10-CM | POA: Diagnosis not present

## 2022-04-08 LAB — BASIC METABOLIC PANEL
Anion gap: 6 (ref 5–15)
BUN: 38 mg/dL — ABNORMAL HIGH (ref 8–23)
CO2: 23 mmol/L (ref 22–32)
Calcium: 8.5 mg/dL — ABNORMAL LOW (ref 8.9–10.3)
Chloride: 111 mmol/L (ref 98–111)
Creatinine, Ser: 2.02 mg/dL — ABNORMAL HIGH (ref 0.61–1.24)
GFR, Estimated: 32 mL/min — ABNORMAL LOW (ref >60)
Glucose, Bld: 134 mg/dL — ABNORMAL HIGH (ref 70–99)
Potassium: 3.6 mmol/L (ref 3.5–5.1)
Sodium: 140 mmol/L (ref 135–145)

## 2022-04-08 LAB — PROTIME-INR
INR: 4 — ABNORMAL HIGH (ref 0.8–1.2)
Prothrombin Time: 38.5 seconds — ABNORMAL HIGH (ref 11.4–15.2)

## 2022-04-08 LAB — CBC
HCT: 33.9 % — ABNORMAL LOW (ref 39.0–52.0)
Hemoglobin: 11.3 g/dL — ABNORMAL LOW (ref 13.0–17.0)
MCH: 32.2 pg (ref 26.0–34.0)
MCHC: 33.3 g/dL (ref 30.0–36.0)
MCV: 96.6 fL (ref 80.0–100.0)
Platelets: 66 10*3/uL — ABNORMAL LOW (ref 150–400)
RBC: 3.51 MIL/uL — ABNORMAL LOW (ref 4.22–5.81)
RDW: 14.4 % (ref 11.5–15.5)
WBC: 6.2 10*3/uL (ref 4.0–10.5)
nRBC: 0 % (ref 0.0–0.2)

## 2022-04-08 LAB — GLUCOSE, CAPILLARY: Glucose-Capillary: 117 mg/dL — ABNORMAL HIGH (ref 70–99)

## 2022-04-08 MED ORDER — LIDOCAINE 5 % EX PTCH
1.0000 | MEDICATED_PATCH | CUTANEOUS | Status: DC
  Administered 2022-04-08: 1 via TRANSDERMAL
  Filled 2022-04-08: qty 1

## 2022-04-08 MED ORDER — HYDROCODONE-ACETAMINOPHEN 5-325 MG PO TABS: 1.0000 | ORAL_TABLET | Freq: Four times a day (QID) | ORAL | Status: DC | PRN

## 2022-04-08 MED ORDER — LIDOCAINE 5 % EX PTCH: 1.0000 | MEDICATED_PATCH | CUTANEOUS | 0 refills | Status: DC

## 2022-04-08 MED ORDER — HYDROCODONE-ACETAMINOPHEN 5-325 MG PO TABS: 1.0000 | ORAL_TABLET | Freq: Four times a day (QID) | ORAL | 0 refills | Status: DC | PRN

## 2022-04-08 MED ORDER — WARFARIN SODIUM 5 MG PO TABS: 5.0000 mg | ORAL_TABLET | ORAL | Status: DC

## 2022-04-08 NOTE — Evaluation (Signed)
Physical Therapy Evaluation Patient Details Name: Jeffery Reynolds MRN: [id number] DOB: [birth year] Today's Date: 04/08/2022  History of Present Illness  Pt is an 88 yo male that presented to the ED after a fall, and R sided rib pain. imaging showed "fractures of the posterior lateral right eighth through 11th ribs.  With subsegmental atelectasis in the right lower lobe". PMh of HLD, HTN, afib, dementia, OSA, insomnia.   Clinical Impression  Patient alert, agreeable to PT, family at bedside. Did report 10/10 pain in R side/ribs. Per family and pt, at baseline he lives with his son who assists with IADLs and medication management, pt is ambulatory with [facility] for community ambulation. Several falls recently.   ModA for bed mobility to assist with pain management. Sitting EOB for several minutes for MD to speak with pt/family, fair balance noted. Sit <> stand with RW and CGA, as well as step pivot to recliner during session. Pt educated on RW use and hand placement. He was able to ambulate ~70f with RW and CGA, effortful for pt due to pain. No LOB or significant gait deviations. Pt/family educated on splinting for pain management, body mechanics (position on the L of the pt) to assist with mobility, and "committing" to the mobility to avoid excessive pain. Overall the patient demonstrated deficits (see "PT Problem List") that impede the patient's functional abilities, safety, and mobility and would benefit from skilled PT intervention.   Recommendation at this time is HHPT with 24/7 supervision assistance to maximize safety and function.      Recommendations for follow up therapy are one component of a multi-disciplinary discharge planning process, led by the attending physician.  Recommendations may be updated based on patient status, additional functional criteria and insurance authorization.  Follow Up Recommendations Home health PT      Assistance Recommended at Discharge Frequent or constant  Supervision/Assistance  Patient can return home with the following  A little help with walking and/or transfers;A little help with bathing/dressing/bathroom;Assistance with cooking/housework;Assist for transportation;Direct supervision/assist for medications management;Help with stairs or ramp for entrance    Equipment Recommendations Rolling walker (2 wheels)  Recommendations for Other Services       Functional Status Assessment Patient has had a recent decline in their functional status and demonstrates the ability to make significant improvements in function in a reasonable and predictable amount of time.     Precautions / Restrictions Precautions Precautions: Fall Precaution Comments: rib fx Restrictions Weight Bearing Restrictions: No      Mobility  Bed Mobility Overal bed mobility: Needs Assistance Bed Mobility: Supine to Sit     Supine to sit: Min assist     General bed mobility comments: trunk elevation, leg assist    Transfers Overall transfer level: Needs assistance Equipment used: Rolling walker (2 wheels) Transfers: Sit to/from Stand Sit to Stand: Min guard           General transfer comment: cues for hand placement    Ambulation/Gait   Gait Distance (Feet): 65 Feet Assistive device: Rolling walker (2 wheels)         General Gait Details: effortful for pt due to pain, but no LOB, some SOB  Stairs            Wheelchair Mobility    Modified Rankin (Stroke Patients Only)       Balance Overall balance assessment: Needs assistance Sitting-balance support: Feet supported Sitting balance-Leahy Scale: Fair       Standing balance-Leahy Scale: Fair  Pertinent Vitals/Pain Pain Assessment Pain Assessment: 0-10 Pain Score: 10-Worst pain ever Pain Location: R side, ribs Pain Descriptors / Indicators: Aching, Grimacing, Moaning, Sore, Guarding Pain Intervention(s): Limited activity within  patient's tolerance, Monitored during session, Repositioned    Home Living Family/patient expects to be discharged to:: Private residence Living Arrangements: Children Available Help at Discharge: Available 24 hours/day Type of Home: House Home Access: Stairs to enter   [employer] of Steps: 1 threshold step   Home Layout: One level Home Equipment: Cane - single point;Grab bars - tub/shower;Grab bars - toilet Additional Comments: 3-5 falls    Prior Function Prior Level of Function : Needs assist             Mobility Comments: SPC for community ambulation ADLs Comments: pt's son preps all medications, most meals/IADLs     Hand Dominance   Dominant Hand: Right    Extremity/Trunk Assessment   Upper Extremity Assessment Upper Extremity Assessment: RUE deficits/detail RUE Deficits / Details: limited ROM and strength in RUE due to pain in ribs/elbow    Lower Extremity Assessment Lower Extremity Assessment: Overall WFL for tasks assessed    Cervical / Trunk Assessment Cervical / Trunk Assessment: Normal  Communication   Communication: No difficulties  Cognition Arousal/Alertness: Awake/alert Behavior During Therapy: WFL for tasks assessed/performed Overall Cognitive Status: History of cognitive impairments - at baseline                                 General Comments: oriented to self, "Zacarias Pontes", reports "I have dementia"        General Comments      Exercises     Assessment/Plan    PT Assessment Patient needs continued PT services  PT Problem List Decreased strength;Decreased mobility;Decreased activity tolerance;Decreased balance;Pain;Decreased knowledge of use of DME       PT Treatment Interventions DME instruction;Therapeutic exercise;Gait training;Stair training;Neuromuscular re-education;Balance training;Therapeutic activities;Patient/family education;Functional mobility training    PT Goals (Current goals can be found  in the Care Plan section)  Acute Rehab PT Goals Patient Stated Goal: to have less pain PT Goal Formulation: With patient Time For Goal Achievement: 04/22/22 Potential to Achieve Goals: Good    Frequency Min 2X/week     Co-evaluation               AM-PAC PT "6 Clicks" Mobility  Outcome Measure Help needed turning from your back to your side while in a flat bed without using bedrails?: A Little Help needed moving from lying on your back to sitting on the side of a flat bed without using bedrails?: A Little Help needed moving to and from a bed to a chair (including a wheelchair)?: A Little Help needed standing up from a chair using your arms (e.g., wheelchair or bedside chair)?: A Little Help needed to walk in hospital room?: A Little Help needed climbing 3-5 steps with a railing? : A Lot 6 Click Score: 17    End of Session Equipment Utilized During Treatment: Gait belt Activity Tolerance: Patient limited by pain Patient left: in chair;with call bell/phone within reach;with family/visitor present Nurse Communication: Mobility status PT Visit Diagnosis: Difficulty in walking, not elsewhere classified (R26.2);Other abnormalities of gait and mobility (R26.89);Muscle weakness (generalized) (M62.81)    Time: 2458-0998 PT Time Calculation (min) (ACUTE ONLY): 34 min   Charges:   PT Evaluation $PT Eval Low Complexity: 1 Low PT Treatments $Therapeutic Activity: 23-37 mins  Lieutenant Diego PT, DPT 12:37 PM,04/08/22

## 2022-04-08 NOTE — TOC Initial Note (Signed)
Transition of Care [facility]) - Initial/Assessment Note    Patient Details  Name: Jeffery Reynolds MRN: [id number] Date of Birth: April 22, 1934  Transition of Care [facility]) CM/SW Contact:    Beverly Sessions, RN Phone Number: 04/08/2022, 1:33 PM  Clinical Narrative:                  Admitted for: Rib fracture Admitted from: home with son PCP: [place]: Denies issues obtaining medications Current home health/prior home health/DME: none  Therapy recommending home health.  Patient in agreement and states he does not have a preference in agency.  Referal made to [facility] with [place].  Lab work to be drawn by home health Rn on [employer]. RW to be delivered to room by  [facility] prior to discharge. Son to transport         Patient Goals and CMS Choice        Expected Discharge Plan and Services           Expected Discharge Date: 04/08/22                                    Prior Living Arrangements/Services                       Activities of Daily Living Home Assistive Devices/Equipment: Kasandra Knudsen (specify quad or straight), Walker (specify type) (straight) ADL Screening (condition at time of admission) Patient's cognitive ability adequate to safely complete daily activities?: No Is the patient deaf or have difficulty hearing?: No Does the patient have difficulty seeing, even when wearing glasses/contacts?: No Does the patient have difficulty concentrating, remembering, or making decisions?: Yes Patient able to express need for assistance with ADLs?: Yes Does the patient have difficulty dressing or bathing?: Yes Independently performs ADLs?: No Communication: Independent Dressing (OT): Needs assistance Is this a change from baseline?: Change from baseline, expected to last >3 days Grooming: Needs assistance Is this a change from baseline?: Change from baseline, expected to last >3 days Feeding: Independent Bathing: Needs assistance Is this a change from baseline?: Change  from baseline, expected to last >3 days Toileting: Needs assistance Is this a change from baseline?: Change from baseline, expected to last >3days In/Out Bed: Needs assistance Is this a change from baseline?: Change from baseline, expected to last >3 days Walks in Home: Needs assistance Is this a change from baseline?: Change from baseline, expected to last >3 days Does the patient have difficulty walking or climbing stairs?: Yes Weakness of Legs: Both Weakness of Arms/Hands: Right  Permission Sought/Granted                  Emotional Assessment              Admission diagnosis:  Rib fracture [S22.39XA] Acute renal insufficiency [N28.9] Contusion of right shoulder, initial encounter [S40.011A] Contusion of right hip, initial encounter [S70.01XA] Fall, initial encounter [W19.XXXA] Closed fracture of multiple ribs of right side, initial encounter [S22.41XA] Patient Active Problem List   Diagnosis Date Noted   Acute renal insufficiency    Rib fracture 04/07/2022   AKI (acute kidney injury) ([place]) 04/07/2022   Dark urine 09/13/2021   Lumbar back pain 09/13/2021   History of intestinal parasite 09/13/2021   Right leg swelling 01/25/2020   Diarrhea 10/26/2019   Atherosclerosis of native artery of lower extremity with intermittent claudication, unspecified laterality ([place]) 10/11/2019   Hemoptysis  09/15/2019   Dementia without behavioral disturbance ([place]) 09/15/2019   Hallucinations 09/07/2019   Diuretic-induced hypokalemia 04/11/2019   Anticoagulated on Coumadin 01/13/2019   Insomnia due to anxiety and fear 04/28/2018   Worsening vision 04/28/2018   Impaired fasting glucose 10/19/2016   Epistaxis 10/18/2016   Urinary hesitancy 10/18/2016   Osteoarthritis of carpometacarpal [facility]) joint of right thumb 08/02/2016   Preoperative general physical examination 01/31/2016   Venous stasis of both lower extremities 01/31/2016   History of cerebrovascular disease 07/31/2015    Encounter for preventive health examination 07/31/2015   Dysplastic nevus of neck 06/13/2015   Obesity 06/13/2015   Mild cognitive impairment 04/13/2015   Chronic atrial fibrillation ([place]) 03/16/2015   Left shoulder pain 11/03/2014   OSA (obstructive sleep apnea) 09/14/2014   Thrombocytopenia ([place]) 08/17/2014   Constipation 08/15/2014   Medicare annual wellness visit, subsequent 08/15/2014   Tubular adenoma of colon 08/14/2014   Chronic pain of left knee 06/17/2014   Chronic dryness of both eyes 06/14/2014   S/P left unicompartmental knee replacement 06/02/2014   Seasonal allergies 12/18/2013   Arthritis 12/18/2013   H/O: gout 12/18/2013   Encounter for therapeutic drug monitoring 12/14/2013   Coronary artery disease of native artery of native heart with stable angina pectoris ([place])    Hyperlipidemia    Essential hypertension    PCP:  Crecencio Mc, MD Pharmacy:   [id number] [id number]: [phone]: [phone]    Social Determinants of Health (SDOH) Interventions    Readmission Risk Interventions     No data to display

## 2022-04-08 NOTE — Discharge Instructions (Signed)
Please keep holding your Lasix for next few days and have your renal function checked before resuming.

## 2022-04-08 NOTE — Progress Notes (Signed)
Patient is alert and oriented x1-2. He knows who he is and sometimes where he is. Needs frequent reorientation. Information he gives is not reliable and this is why I could not complete the abuse scale. I did speak with the son [name] [phone]) who was able to give most of his info. [name] stated that he has been taking care of his dad for 4 years and knows his medications well. [name] says that his warfarin is not supposed to be given the way we have been giving them. May need to verify medications with pharmacy. Inpatient pharmacy involved in dosing. I asked [name] if he could bring patient's medication list to the hospital. Placed patient on TED hose as ordered and purewick for incontinence. Patient states that he usually gets around at home with straight cane or walker but has been weak for the last few days. Patient fell while trying to go to the toilet and broke his 8-11th ribs on the right side-area is very tender to the touch. Offered pain med but patient refused stating that it only hurts with activity. Skin is intact with blanchable redness to his buttock area bilaterally-protective foam placed. Denied additional needs at this time.

## 2022-04-08 NOTE — Discharge Summary (Signed)
Physician Discharge Summary   Patient: Jeffery Reynolds MRN: [id number] DOB: Jan 19, 1934  Admit date:     04/07/2022  Discharge date: 04/08/22  Discharge Physician: Lorella Nimrod   PCP: Crecencio Mc, MD   Recommendations at discharge:  Please obtain BMP in next 2 to 3 days. Patient can resume his home oxygen if renal function back to baseline. Follow-up with primary care provider within a week.  Discharge Diagnoses: Principal Problem:   Rib fracture Active Problems:   Hyperlipidemia   Essential hypertension   Thrombocytopenia (HCC)   OSA (obstructive sleep apnea)   Chronic atrial fibrillation (HCC)   Mild cognitive impairment   Obesity   Insomnia due to anxiety and fear   Anticoagulated on Coumadin   Dementia without behavioral disturbance (HCC)   AKI (acute kidney injury) ([place])  Resolved Problems:   * No resolved hospital problems. [facility] Course: Mr. Jeffery Reynolds is an 88 year old is hyperlipidemia, hypertension, atrial fibrillation, on warfarin, memory disturbances, who presents for chief concerns of fall with right side and right rib pain.  Initial vitals in the emergency department showed temperature of 98.7, respiration rate of 16, heart rate of 103, blood pressure 100/71, SPO2 of 100% on room air.   Serum sodium is 139, potassium 3.9, chloride 105, bicarb 21, BUN 33, serum creatinine of 2.28, GFR 27, nonfasting blood glucose 194, WBC 9.7, hemoglobin 14.5, platelets of 76  CT of the chest abdomen pelvis without contrast: Was read as fractures of the posterior lateral right eighth through 11th ribs.  With subsegmental atelectasis in the right lower lobe.  ED treatment: Acetaminophen 1 g p.o., sodium chloride 1 L bolus.  Patient remained hemodynamically stable.  Able to work with PT who recommended home health services which were ordered. Patient received some IV fluid with some improvement of his renal function but still above baseline.  He should be encouraged  to keep himself well-hydrated.  He is being discharged on Norco and lidocaine patch to be used as needed for pain.  He should be encouraged to use spirometry regularly and need to have a close follow-up with his primary care provider and nephrologist.  We will continue rest of his home medications.  He was instructed to hold Lasix for few days and have his renal function checked before resuming.  Assessment and Plan: * Rib fracture - Right 8th-11th ribs presumed secondary to mechanical fall - Incentive spirometry, flutter valve - Family requesting to avoid narcotics - Acetaminophen 1000 mg p.o. every 6 hours as needed for mild pain, fever, headaches - Once patient's renal function improved, a.m. team to consider Toradol as an alternative pain medication - Fall precaution  AKI (acute kidney injury) ([place]) - Per family, patient has had appropriate p.o. intake and no decrease appetite other than day of admission - Avoid nephrotoxic agents - LR 125 mL/h, 1 day ordered - BMP in the a.m., if renal function does not improve recommend a.m. team to consider consulting nephrology  Anticoagulated on Coumadin - Warfarin per pharmacy ordered  Insomnia due to anxiety and fear - Melatonin 5 mg nightly as needed for sleep ordered - Patient's family requesting to avoid sedating medications if possible  Mild cognitive impairment - Resumed home donepezil 2.5 mg nightly  Chronic atrial fibrillation ([place]) - Warfarin per pharmacy ordered  Hyperlipidemia - Atorvastatin 40 mg daily resumed   Pain control - [place] Controlled Substance Reporting System database was reviewed. and patient was instructed, not to drive, operate heavy  machinery, perform activities at heights, swimming or participation in water activities or provide baby-sitting services while on Pain, Sleep and Anxiety Medications; until their outpatient Physician has advised to do so again. Also recommended to not to take more than  prescribed Pain, Sleep and Anxiety Medications.  Consultants: None Procedures performed: None Disposition: Home health Diet recommendation:  Discharge Diet Orders (From admission, onward)     Start     Ordered   04/08/22 0000  Diet - low sodium heart healthy        04/08/22 1226           Cardiac diet DISCHARGE MEDICATION: Allergies as of 04/08/2022   No Known Allergies      Medication List     STOP taking these medications    DRY EYES OP   hydrocortisone 2.5 % rectal cream Commonly known as: Anusol-HC   memantine 5 MG tablet Commonly known as: NAMENDA   ofloxacin 0.3 % ophthalmic solution Commonly known as: OCUFLOX   prednisoLONE acetate 1 % ophthalmic suspension Commonly known as: PRED FORTE       TAKE these medications    allopurinol 300 MG tablet Commonly known as: ZYLOPRIM TAKE 1 TABLET ON MONDAY, WEDNESDAY AND FRIDAY   atorvastatin 40 MG tablet Commonly known as: LIPITOR Take 1 tablet (40 mg total) by mouth daily. After 6 pm   donepezil 5 MG tablet Commonly known as: ARICEPT Take 2.5 mg by mouth at bedtime.   furosemide 20 MG tablet Commonly known as: LASIX TAKE 1 TABLET BY MOUTH EVERY OTHER DAY FOR FLUID RETENTION   HYDROcodone-acetaminophen 5-325 MG tablet Commonly known as: NORCO/VICODIN Take 1 tablet by mouth every 6 (six) hours as needed for moderate pain or severe pain.   lidocaine 5 % Commonly known as: LIDODERM Place 1 patch onto the skin daily. Remove & Discard patch within 12 hours or as directed by MD   metoprolol tartrate 50 MG tablet Commonly known as: LOPRESSOR TAKE 1 TABLET BY MOUTH TWICE A DAY   warfarin 5 MG tablet Commonly known as: COUMADIN Take as directed. If you are unsure how to take this medication, talk to your nurse or doctor. Original instructions: 1 TAB DAILY AT 6 PM. ALL DAYS EXCEPT FOR MONDAY AND FRIDAY. MONDAY AND FRIDAY PT TAKES 2.5 MG.        Discharge Exam: Filed Weights   04/07/22 1515   Weight: 86.2 kg   General.     In no acute distress. Pulmonary.  Lungs clear bilaterally, normal respiratory effort. CV.  Regular rate and rhythm, no JVD, rub or murmur. Abdomen.  Soft, nontender, nondistended, BS positive. CNS.  Alert and oriented .  No focal neurologic deficit. Extremities.  No edema, no cyanosis, pulses intact and symmetrical. Psychiatry.  Judgment and insight appears normal.   Condition at discharge: stable  The results of significant diagnostics from this hospitalization (including imaging, microbiology, ancillary and laboratory) are listed below for reference.   Imaging Studies: CT CHEST ABDOMEN PELVIS WO CONTRAST  Result Date: 04/07/2022 CLINICAL DATA:  Poly trauma, blunt trauma, elevated creatinine EXAM: CT CHEST, ABDOMEN AND PELVIS WITHOUT CONTRAST TECHNIQUE: Multidetector CT imaging of the chest, abdomen and pelvis was performed following the standard protocol without IV contrast. RADIATION DOSE REDUCTION: This exam was performed according to the departmental dose-optimization program which includes automated exposure control, adjustment of the mA and/or kV according to patient size and/or use of iterative reconstruction technique. COMPARISON:  CT angio chest 10/21/2021 FINDINGS: CT CHEST  FINDINGS Cardiovascular: Atherosclerotic calcifications aorta, coronary arteries, and proximal great vessels. Post CABG. Aorta upper normal caliber. No pericardial effusion. Mediastinum/Nodes: Base of cervical region unremarkable. Mediastinum clear without hemorrhage or mass. No thoracic adenopathy. Esophagus normal appearance. Lungs/Pleura: Subsegmental atelectasis in RIGHT lower lobe. Lungs otherwise clear. No pulmonary infiltrate, pleural effusion, or pneumothorax. Musculoskeletal: Minimally displaced fractures of the posterolateral RIGHT ninth and tenth ribs. Nondisplaced fracture posterolateral RIGHT eighth rib. CT ABDOMEN PELVIS FINDINGS Hepatobiliary: Gallbladder surgically  absent.  Liver unremarkable. Pancreas: Normal appearance Spleen: Normal appearance Adrenals/Urinary Tract: Adrenal glands, kidneys, ureters, and bladder normal appearance Stomach/Bowel: Minimal sigmoid diverticulosis without evidence of diverticulitis. Stomach and bowel loops otherwise normal appearance. Normal appendix. Vascular/Lymphatic: Atherosclerotic calcifications aorta and iliac arteries without aneurysm. No adenopathy. Reproductive: Minimal prostatic enlargement Other: Small BILATERAL inguinal hernias containing fat. No free air or free fluid. Musculoskeletal: Osseous demineralization. Nondisplaced fracture of distal RIGHT eleventh rib. IMPRESSION: Fractures of posterolateral RIGHT eighth through eleventh ribs. Subsegmental atelectasis in RIGHT lower lobe. Minimal prostatic enlargement. Small BILATERAL inguinal hernias containing fat. Minimal sigmoid diverticulosis. No acute intra-abdominal or intrapelvic abnormalities. Aortic Atherosclerosis (ICD10-I70.0). Electronically Signed   By: Lavonia Dana M.D.   On: 04/07/2022 18:54   DG Ribs Unilateral W/Chest Right  Result Date: 04/07/2022 CLINICAL DATA:  Trauma, fall EXAM: RIGHT RIBS AND CHEST - 3+ VIEW COMPARISON:  None Available. FINDINGS: There is nondisplaced fracture in the lateral end of right eleventh rib. The soft tissue prominence in the right lateral abdominal wall. Surgical clips are seen in right side of abdomen. There is previous coronary bypass surgery. IMPRESSION: Undisplaced fracture is seen in the lateral end of right eleventh rib. There is soft tissue thickening in the right lateral abdominal wall. This may be due to contusion or hematoma in the abdominal wall. If clinically warranted, follow-up CT may be considered. Electronically Signed   By: Elmer Picker M.D.   On: 04/07/2022 16:43   DG Hip Unilat W or Wo Pelvis 2-3 Views Right  Result Date: 04/07/2022 CLINICAL DATA:  Trauma, fall EXAM: DG HIP (WITH OR WITHOUT PELVIS) 2-3V  RIGHT COMPARISON:  None Available. FINDINGS: No displaced fracture or dislocation is seen. Minimal bony spurs are seen. Arterial calcifications are seen in soft tissues. Marked degenerative changes are noted in the visualized lower lumbar spine. IMPRESSION: No fracture or dislocation is seen in right hip. Electronically Signed   By: Elmer Picker M.D.   On: 04/07/2022 16:40   DG Shoulder Right  Result Date: 04/07/2022 CLINICAL DATA:  Trauma, fall EXAM: RIGHT SHOULDER - 2+ VIEW COMPARISON:  None Available. FINDINGS: No displaced fracture or dislocation is seen. There is a metallic anchor in proximal humerus from previous surgery. Small bony spurs are noted. No abnormal soft tissue calcifications are seen. There is evidence of previous coronary bypass surgery. IMPRESSION: No recent fracture or dislocation is seen in right shoulder. Postsurgical and degenerative changes are noted in right shoulder. Electronically Signed   By: Elmer Picker M.D.   On: 04/07/2022 16:39    Microbiology: Results for orders placed or performed in visit on 09/13/21  Urine Culture     Status: None   Collection Time: 09/13/21 10:51 AM   Specimen: Urine  Result Value Ref Range Status   MICRO NUMBER: [id number]  Final   SPECIMEN QUALITY: Adequate  Final   Sample Source NOT GIVEN  Final   STATUS: FINAL  Final   Result: No Growth  Final    Labs: CBC: Recent Labs  Lab 04/07/22 1738 04/08/22 0452  WBC 9.7 6.2  HGB 14.5 11.3*  HCT 45.2 33.9*  MCV 99.8 96.6  PLT 76* 66*   Basic Metabolic Panel: Recent Labs  Lab 04/07/22 1738 04/08/22 0452  NA 139 140  K 3.9 3.6  CL 105 111  CO2 21* 23  GLUCOSE 194* 134*  BUN 33* 38*  CREATININE 2.28* 2.02*  CALCIUM 9.4 8.5*   Liver Function Tests: Recent Labs  Lab 04/07/22 1738  AST 95*  ALT 49*  ALKPHOS 105  BILITOT 1.9*  PROT 7.0  ALBUMIN 4.3   CBG: No results for input(s): "GLUCAP" in the last 168 hours.  Discharge time spent: greater than 30  minutes.  This record has been created using Systems analyst. Errors have been sought and corrected,but may not always be located. Such creation errors do not reflect on the standard of care.   Signed: Lorella Nimrod, MD [facility] 04/08/2022

## 2022-04-08 NOTE — Progress Notes (Signed)
ANTICOAGULATION CONSULT NOTE - Initial Consult  Pharmacy Consult for Warfarin  Indication: atrial fibrillation  No Known Allergies  Patient Measurements: Height: '5\' 8"'$  (172.7 cm) Weight: 86.2 kg (190 lb) IBW/kg (Calculated) : 68.4 Heparin Dosing Weight:   Vital Signs: Temp: 97.9 F (36.6 C) (08/15 0459) Temp Source: Oral (08/15 0459) BP: 111/53 (08/15 0459) Pulse Rate: 63 (08/15 0459)  Labs: Recent Labs    04/07/22 1738 04/08/22 0452  HGB 14.5 11.3*  HCT 45.2 33.9*  PLT 76* 66*  LABPROT 30.4* 38.5*  INR 2.9* 4.0*  CREATININE 2.28* 2.02*     Estimated Creatinine Clearance: 28 mL/min (A) (by C-G formula based on SCr of 2.02 mg/dL (H)).   Medical History: Past Medical History:  Diagnosis Date   Arthritis    CAD (coronary artery disease)    a. s/p 6-V CABG in [place] in 1995; b. MV 4/12: mild intensity, fixed perfusion defect involving the basal to mid anterior wall consistent with injury to the diagonal distribution (this finding was also noted on prior study dating back to 2008). Overall, no ischemia was noted and there was no high-risk prognostic indicators based on this study. EF 63%   Carotid artery disease ([place])    a. ultrasound 12/16: 1-39% bilateral ICA stenosis   Chronic atrial fibrillation ([place]) 2000   a. CHADS2VASc => 4 (HTN, age x 2, vascular disease); b. on Coumadin [facility]   Chronic back pain    Cognitive impairment    Colon polyps    Diastolic dysfunction    a. 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 After 6 pm 90 tablet 3 04/06/2022 at 1800   donepezil (ARICEPT) 5 MG tablet Take 2.5 mg by mouth at bedtime.   04/06/2022 at 2000   furosemide (LASIX) 20 MG tablet TAKE 1 TABLET BY MOUTH EVERY OTHER DAY FOR FLUID RETENTION 45 tablet 2 04/06/2022 at 0800   metoprolol tartrate (LOPRESSOR) 50 MG tablet TAKE 1 TABLET BY MOUTH TWICE A DAY 180 tablet 1 04/06/2022 at 1800   warfarin (COUMADIN) 5 MG tablet 1 TAB DAILY AT 6 PM. ALL DAYS EXCEPT FOR MONDAY AND FRIDAY. MONDAY AND FRIDAY PT TAKES 2.5 MG. 90 tablet 1 04/06/2022 at 1800   Artificial Tear Ointment (DRY EYES OP) Apply 1 drop to eye 3 (three) times daily. (Patient not taking: Reported on 04/07/2022)   Not Taking   hydrocortisone (ANUSOL-HC) 2.5 % rectal cream Place 1 application. rectally 3 (three) times daily. Prn (Patient not taking: Reported on 04/07/2022) 30 g 0 Not Taking   memantine (NAMENDA) 5 MG tablet Take by mouth. (Patient not taking: Reported on 01/17/2022)   Not Taking   memantine (NAMENDA) 5 MG tablet Take 5 mg by mouth  daily. (Patient not taking: Reported on 01/17/2022)   Not Taking   ofloxacin (OCUFLOX) 0.3 % ophthalmic solution Place 1 drop into the right eye 4 (four) times daily. (Patient not taking: Reported on 04/07/2022)   Not Taking   prednisoLONE acetate (PRED FORTE) 1 % ophthalmic suspension PLEASE SEE ATTACHED FOR DETAILED DIRECTIONS (Patient not taking: Reported on 04/07/2022)   Not Taking    Assessment: Pharmacy consulted to dose warfarin for Afib in this 88 year old male admitted with rib fracture.   Patient previously stable dose: Warfarin 5 mg PO Q Sat, Sun, Tues, Wed, Thurs and 2.5 mg PO Mon &Fri with TTR 66.4%.  Last dose of warfarin on 8/13 @ 1800.  Baseline  labs 8/14:  INR @ 1738 = 2.9 (therapeutic)           Hgb = 14.5           Hct = 45.2   Goal of Therapy:  INR 2-3   Results: Date INR Warfarin dose Comments 8/15 4.0    Supra-therapeutic & AKI  Plan:  HOLD warfarin today due to supra-therapeutic INR Repeat INR with AM labs for the next 3 days Will follow s/sx of bleeding  Elda Dunkerson Rodriguez-Guzman PharmD, BCPS 04/08/2022 8:23 AM

## 2022-04-08 NOTE — Plan of Care (Signed)
Patient ID: Jeffery Reynolds, male   DOB: [birth year], 88 y.o.   MRN: [id number]  Problem: Education: Goal: Knowledge of General Education information will improve Description: Including pain rating scale, medication(s)/side effects and non-pharmacologic comfort measures Outcome: Adequate for Discharge   Problem: Health Behavior/Discharge Planning: Goal: Ability to manage health-related needs will improve Outcome: Adequate for Discharge   Problem: Clinical Measurements: Goal: Ability to maintain clinical measurements within normal limits will improve Outcome: Adequate for Discharge Goal: Will remain free from infection Outcome: Adequate for Discharge Goal: Diagnostic test results will improve Outcome: Adequate for Discharge Goal: Respiratory complications will improve Outcome: Adequate for Discharge Goal: Cardiovascular complication will be avoided Outcome: Adequate for Discharge   Problem: Activity: Goal: Risk for activity intolerance will decrease Outcome: Adequate for Discharge   Problem: Nutrition: Goal: Adequate nutrition will be maintained Outcome: Adequate for Discharge   Problem: Coping: Goal: Level of anxiety will decrease Outcome: Adequate for Discharge   Problem: Elimination: Goal: Will not experience complications related to bowel motility Outcome: Adequate for Discharge Goal: Will not experience complications related to urinary retention Outcome: Adequate for Discharge   Problem: Pain Managment: Goal: General experience of comfort will improve Outcome: Adequate for Discharge   Problem: Safety: Goal: Ability to remain free from injury will improve Outcome: Adequate for Discharge   Problem: Skin Integrity: Goal: Risk for impaired skin integrity will decrease Outcome: Adequate for Discharge   Problem: Acute Rehab PT Goals(only PT should resolve) Goal: Pt Will Go Supine/Side To Sit Outcome: Adequate for Discharge Goal: Patient Will Transfer Sit To/From  Stand Outcome: Adequate for Discharge Goal: Pt Will Ambulate Outcome: Adequate for Discharge Goal: Pt Will Go Up/Down Stairs Outcome: Adequate for Discharge    Haydee Salter, RN

## 2022-04-09 ENCOUNTER — Telehealth: Payer: Self-pay

## 2022-04-09 ENCOUNTER — Ambulatory Visit (INDEPENDENT_AMBULATORY_CARE_PROVIDER_SITE_OTHER): Payer: Medicare Other

## 2022-04-09 DIAGNOSIS — I4819 Other persistent atrial fibrillation: Secondary | ICD-10-CM

## 2022-04-09 DIAGNOSIS — Z5181 Encounter for therapeutic drug level monitoring: Secondary | ICD-10-CM | POA: Diagnosis not present

## 2022-04-09 DIAGNOSIS — E876 Hypokalemia: Secondary | ICD-10-CM

## 2022-04-09 DIAGNOSIS — N179 Acute kidney failure, unspecified: Secondary | ICD-10-CM

## 2022-04-09 LAB — POCT INR: INR: 2.8 (ref 2.0–3.0)

## 2022-04-09 NOTE — Telephone Encounter (Signed)
Transition Care Management Follow-up Telephone Call Date of discharge and from where: 04/08/22 [facility] How have you been since you were released from the hospital? Information received by son, HIPAA compliant. He is doing okay but ribs are hurting as expected. Denies any new or harmful symptoms.  Cane in use. Son assist at home. No oxygen in use. Taking all medications as directed.  Any questions or concerns? Yes. Son is concerned of BMP check needed. Please call son Raquel Sarna [phone] for scheduling. Coumadin check completed today.   Items Reviewed: Did the pt receive and understand the discharge instructions provided? Yes  Medications obtained and verified? Yes  Any new allergies since your discharge? No  Dietary orders reviewed? Yes Do you have support at home? Yes   Home Care and Equipment/Supplies: Were home health services ordered?   Functional Questionnaire: (I = Independent and D = Dependent) ADLs: Family assist  Maintaining continence- I  Transferring/Ambulation- cane  Managing Meds- Family assist  Follow up appointments reviewed:  PCP Hospital f/u appt confirmed? Yes  Scheduled to see PCP on 04/18/22 @ 11:00. Patient willing to come sooner if schedule allows or okay to schedule lab as separate appointment.  Please call son Raquel Sarna to schedule BMP and any schedule changes.  [facility] f/u appt confirmed? Yes , completed coumadin check with Cardiovascular today.  Are transportation arrangements needed? No  If their condition worsens, is the pt aware to call PCP or go to the Emergency Dept.? Yes Was the patient provided with contact information for the PCP's office or ED? Yes Was to pt encouraged to call back with questions or concerns? Yes

## 2022-04-09 NOTE — Patient Instructions (Signed)
-   HOLD TONIGHT IF HE TAKES PAIN MEDICATION then continue taking warfarin 1 tablet daily except for 1/2 a tablet on WEDNESDAYS - Recheck INR in 2 weeks

## 2022-04-09 NOTE — Telephone Encounter (Signed)
Renal function is improving based on yesterday's BMP.  It should be repeated on Friday.  Please schedule appt

## 2022-04-09 NOTE — Addendum Note (Signed)
Addended by: Crecencio Mc on: 04/09/2022 08:14 PM   Modules accepted: Orders

## 2022-04-10 DIAGNOSIS — N179 Acute kidney failure, unspecified: Secondary | ICD-10-CM | POA: Diagnosis not present

## 2022-04-10 DIAGNOSIS — Z7901 Long term (current) use of anticoagulants: Secondary | ICD-10-CM | POA: Diagnosis not present

## 2022-04-10 DIAGNOSIS — Z9181 History of falling: Secondary | ICD-10-CM | POA: Diagnosis not present

## 2022-04-10 DIAGNOSIS — N170 Acute kidney failure with tubular necrosis: Secondary | ICD-10-CM | POA: Diagnosis not present

## 2022-04-10 DIAGNOSIS — K4020 Bilateral inguinal hernia, without obstruction or gangrene, not specified as recurrent: Secondary | ICD-10-CM | POA: Diagnosis not present

## 2022-04-10 DIAGNOSIS — G4700 Insomnia, unspecified: Secondary | ICD-10-CM | POA: Diagnosis not present

## 2022-04-10 DIAGNOSIS — Z951 Presence of aortocoronary bypass graft: Secondary | ICD-10-CM | POA: Diagnosis not present

## 2022-04-10 DIAGNOSIS — K219 Gastro-esophageal reflux disease without esophagitis: Secondary | ICD-10-CM | POA: Diagnosis not present

## 2022-04-10 DIAGNOSIS — I119 Hypertensive heart disease without heart failure: Secondary | ICD-10-CM | POA: Diagnosis not present

## 2022-04-10 DIAGNOSIS — D696 Thrombocytopenia, unspecified: Secondary | ICD-10-CM | POA: Diagnosis not present

## 2022-04-10 DIAGNOSIS — K5730 Diverticulosis of large intestine without perforation or abscess without bleeding: Secondary | ICD-10-CM | POA: Diagnosis not present

## 2022-04-10 DIAGNOSIS — M549 Dorsalgia, unspecified: Secondary | ICD-10-CM | POA: Diagnosis not present

## 2022-04-10 DIAGNOSIS — M109 Gout, unspecified: Secondary | ICD-10-CM | POA: Diagnosis not present

## 2022-04-10 DIAGNOSIS — G8929 Other chronic pain: Secondary | ICD-10-CM | POA: Diagnosis not present

## 2022-04-10 DIAGNOSIS — I482 Chronic atrial fibrillation, unspecified: Secondary | ICD-10-CM | POA: Diagnosis not present

## 2022-04-10 DIAGNOSIS — E785 Hyperlipidemia, unspecified: Secondary | ICD-10-CM | POA: Diagnosis not present

## 2022-04-10 DIAGNOSIS — M19011 Primary osteoarthritis, right shoulder: Secondary | ICD-10-CM | POA: Diagnosis not present

## 2022-04-10 DIAGNOSIS — S2241XD Multiple fractures of ribs, right side, subsequent encounter for fracture with routine healing: Secondary | ICD-10-CM | POA: Diagnosis not present

## 2022-04-10 DIAGNOSIS — F0394 Unspecified dementia, unspecified severity, with anxiety: Secondary | ICD-10-CM | POA: Diagnosis not present

## 2022-04-10 DIAGNOSIS — J9811 Atelectasis: Secondary | ICD-10-CM | POA: Diagnosis not present

## 2022-04-10 DIAGNOSIS — I2510 Atherosclerotic heart disease of native coronary artery without angina pectoris: Secondary | ICD-10-CM | POA: Diagnosis not present

## 2022-04-10 DIAGNOSIS — G4733 Obstructive sleep apnea (adult) (pediatric): Secondary | ICD-10-CM | POA: Diagnosis not present

## 2022-04-10 DIAGNOSIS — D649 Anemia, unspecified: Secondary | ICD-10-CM | POA: Diagnosis not present

## 2022-04-10 LAB — BASIC METABOLIC PANEL
BUN: 25 — AB (ref 4–21)
CO2: 25 — AB (ref 13–22)
Chloride: 105 (ref 99–108)
Creatinine: 1.2 (ref 0.6–1.3)
Glucose: 105
Potassium: 3.8 mEq/L (ref 3.5–5.1)
Sodium: 142 (ref 137–147)

## 2022-04-10 LAB — COMPREHENSIVE METABOLIC PANEL
Calcium: 8.9 (ref 8.7–10.7)
eGFR: 61

## 2022-04-10 NOTE — Telephone Encounter (Signed)
LMTCB

## 2022-04-10 NOTE — Telephone Encounter (Signed)
Patient's son, Doyle Kunath, returned Adair Laundry, CMA's call.  I read Dr. Helene Kelp Tullo's note to [facility].  Raquel Sarna states that someone from home care came by today to draw patient's blood and he is pretty sure they were checking patient's BMP.  Raquel Sarna states he will verify and call us back to schedule a lab appointment tomorrow if they were not checking patient's BMP.  Raquel Sarna states they are also setting patient up with physical therapy and another therapy, he states he was not present and has not seen the paperwork yet.

## 2022-04-10 NOTE — Telephone Encounter (Signed)
Pt called back stating that they did the in home lab work today and pt verified that it was the correct one. Pt stated lab results should be in tomorrow

## 2022-04-10 NOTE — Telephone Encounter (Signed)
FYI

## 2022-04-11 DIAGNOSIS — I119 Hypertensive heart disease without heart failure: Secondary | ICD-10-CM | POA: Diagnosis not present

## 2022-04-11 DIAGNOSIS — M19011 Primary osteoarthritis, right shoulder: Secondary | ICD-10-CM | POA: Diagnosis not present

## 2022-04-11 DIAGNOSIS — I2510 Atherosclerotic heart disease of native coronary artery without angina pectoris: Secondary | ICD-10-CM | POA: Diagnosis not present

## 2022-04-11 DIAGNOSIS — I482 Chronic atrial fibrillation, unspecified: Secondary | ICD-10-CM | POA: Diagnosis not present

## 2022-04-11 DIAGNOSIS — S2241XD Multiple fractures of ribs, right side, subsequent encounter for fracture with routine healing: Secondary | ICD-10-CM | POA: Diagnosis not present

## 2022-04-11 DIAGNOSIS — F0394 Unspecified dementia, unspecified severity, with anxiety: Secondary | ICD-10-CM | POA: Diagnosis not present

## 2022-04-14 ENCOUNTER — Telehealth: Payer: Self-pay

## 2022-04-14 DIAGNOSIS — F0394 Unspecified dementia, unspecified severity, with anxiety: Secondary | ICD-10-CM | POA: Diagnosis not present

## 2022-04-14 DIAGNOSIS — S2241XD Multiple fractures of ribs, right side, subsequent encounter for fracture with routine healing: Secondary | ICD-10-CM | POA: Diagnosis not present

## 2022-04-14 DIAGNOSIS — I119 Hypertensive heart disease without heart failure: Secondary | ICD-10-CM | POA: Diagnosis not present

## 2022-04-14 DIAGNOSIS — I2510 Atherosclerotic heart disease of native coronary artery without angina pectoris: Secondary | ICD-10-CM | POA: Diagnosis not present

## 2022-04-14 DIAGNOSIS — I482 Chronic atrial fibrillation, unspecified: Secondary | ICD-10-CM | POA: Diagnosis not present

## 2022-04-14 DIAGNOSIS — M19011 Primary osteoarthritis, right shoulder: Secondary | ICD-10-CM | POA: Diagnosis not present

## 2022-04-14 NOTE — Telephone Encounter (Signed)
Patient's son, Emmons Toth, is returning Adair Laundry, CMA's call.

## 2022-04-14 NOTE — Telephone Encounter (Signed)
LMTCB

## 2022-04-14 NOTE — Telephone Encounter (Signed)
Received lab results. Placed in yellow results folder.

## 2022-04-15 DIAGNOSIS — I119 Hypertensive heart disease without heart failure: Secondary | ICD-10-CM | POA: Diagnosis not present

## 2022-04-15 DIAGNOSIS — I482 Chronic atrial fibrillation, unspecified: Secondary | ICD-10-CM | POA: Diagnosis not present

## 2022-04-15 DIAGNOSIS — I2510 Atherosclerotic heart disease of native coronary artery without angina pectoris: Secondary | ICD-10-CM | POA: Diagnosis not present

## 2022-04-15 DIAGNOSIS — M19011 Primary osteoarthritis, right shoulder: Secondary | ICD-10-CM | POA: Diagnosis not present

## 2022-04-15 DIAGNOSIS — F0394 Unspecified dementia, unspecified severity, with anxiety: Secondary | ICD-10-CM | POA: Diagnosis not present

## 2022-04-15 DIAGNOSIS — S2241XD Multiple fractures of ribs, right side, subsequent encounter for fracture with routine healing: Secondary | ICD-10-CM | POA: Diagnosis not present

## 2022-04-15 NOTE — Telephone Encounter (Signed)
See previous message

## 2022-04-15 NOTE — Telephone Encounter (Signed)
Spoke with pt's son and informed him of lab results. Son gave a verbal understanding.

## 2022-04-16 ENCOUNTER — Ambulatory Visit (INDEPENDENT_AMBULATORY_CARE_PROVIDER_SITE_OTHER): Payer: Medicare Other

## 2022-04-16 DIAGNOSIS — I4819 Other persistent atrial fibrillation: Secondary | ICD-10-CM

## 2022-04-16 DIAGNOSIS — Z5181 Encounter for therapeutic drug level monitoring: Secondary | ICD-10-CM

## 2022-04-16 LAB — POCT INR: INR: 2.3 (ref 2.0–3.0)

## 2022-04-16 NOTE — Patient Instructions (Signed)
continue taking warfarin 1 tablet daily except for 1/2 a tablet on WEDNESDAYS - Recheck INR in 4 weeks

## 2022-04-17 ENCOUNTER — Other Ambulatory Visit: Payer: Self-pay

## 2022-04-17 DIAGNOSIS — I119 Hypertensive heart disease without heart failure: Secondary | ICD-10-CM | POA: Diagnosis not present

## 2022-04-17 DIAGNOSIS — I2510 Atherosclerotic heart disease of native coronary artery without angina pectoris: Secondary | ICD-10-CM | POA: Diagnosis not present

## 2022-04-17 DIAGNOSIS — S2241XD Multiple fractures of ribs, right side, subsequent encounter for fracture with routine healing: Secondary | ICD-10-CM | POA: Diagnosis not present

## 2022-04-17 DIAGNOSIS — F0394 Unspecified dementia, unspecified severity, with anxiety: Secondary | ICD-10-CM | POA: Diagnosis not present

## 2022-04-17 DIAGNOSIS — M19011 Primary osteoarthritis, right shoulder: Secondary | ICD-10-CM | POA: Diagnosis not present

## 2022-04-17 DIAGNOSIS — I482 Chronic atrial fibrillation, unspecified: Secondary | ICD-10-CM | POA: Diagnosis not present

## 2022-04-18 ENCOUNTER — Encounter: Payer: Self-pay | Admitting: Internal Medicine

## 2022-04-18 ENCOUNTER — Other Ambulatory Visit: Payer: Self-pay | Admitting: Internal Medicine

## 2022-04-18 ENCOUNTER — Ambulatory Visit (INDEPENDENT_AMBULATORY_CARE_PROVIDER_SITE_OTHER): Payer: Medicare Other | Admitting: Internal Medicine

## 2022-04-18 VITALS — BP 118/66 | HR 82 | Temp 98.1°F | Ht 68.0 in | Wt 186.0 lb

## 2022-04-18 DIAGNOSIS — F0390 Unspecified dementia without behavioral disturbance: Secondary | ICD-10-CM

## 2022-04-18 DIAGNOSIS — I482 Chronic atrial fibrillation, unspecified: Secondary | ICD-10-CM | POA: Diagnosis not present

## 2022-04-18 DIAGNOSIS — S2241XD Multiple fractures of ribs, right side, subsequent encounter for fracture with routine healing: Secondary | ICD-10-CM | POA: Diagnosis not present

## 2022-04-18 DIAGNOSIS — D649 Anemia, unspecified: Secondary | ICD-10-CM | POA: Diagnosis not present

## 2022-04-18 DIAGNOSIS — M549 Dorsalgia, unspecified: Secondary | ICD-10-CM

## 2022-04-18 DIAGNOSIS — K5730 Diverticulosis of large intestine without perforation or abscess without bleeding: Secondary | ICD-10-CM | POA: Diagnosis not present

## 2022-04-18 DIAGNOSIS — Z09 Encounter for follow-up examination after completed treatment for conditions other than malignant neoplasm: Secondary | ICD-10-CM | POA: Diagnosis not present

## 2022-04-18 DIAGNOSIS — M109 Gout, unspecified: Secondary | ICD-10-CM | POA: Diagnosis not present

## 2022-04-18 DIAGNOSIS — K4020 Bilateral inguinal hernia, without obstruction or gangrene, not specified as recurrent: Secondary | ICD-10-CM

## 2022-04-18 DIAGNOSIS — Z951 Presence of aortocoronary bypass graft: Secondary | ICD-10-CM

## 2022-04-18 DIAGNOSIS — I119 Hypertensive heart disease without heart failure: Secondary | ICD-10-CM | POA: Diagnosis not present

## 2022-04-18 DIAGNOSIS — Z7901 Long term (current) use of anticoagulants: Secondary | ICD-10-CM

## 2022-04-18 DIAGNOSIS — E785 Hyperlipidemia, unspecified: Secondary | ICD-10-CM

## 2022-04-18 DIAGNOSIS — J9811 Atelectasis: Secondary | ICD-10-CM

## 2022-04-18 DIAGNOSIS — Z9181 History of falling: Secondary | ICD-10-CM

## 2022-04-18 DIAGNOSIS — N179 Acute kidney failure, unspecified: Secondary | ICD-10-CM | POA: Diagnosis not present

## 2022-04-18 DIAGNOSIS — M19011 Primary osteoarthritis, right shoulder: Secondary | ICD-10-CM | POA: Diagnosis not present

## 2022-04-18 DIAGNOSIS — K219 Gastro-esophageal reflux disease without esophagitis: Secondary | ICD-10-CM | POA: Diagnosis not present

## 2022-04-18 DIAGNOSIS — G8929 Other chronic pain: Secondary | ICD-10-CM

## 2022-04-18 DIAGNOSIS — L603 Nail dystrophy: Secondary | ICD-10-CM | POA: Diagnosis not present

## 2022-04-18 DIAGNOSIS — K625 Hemorrhage of anus and rectum: Secondary | ICD-10-CM | POA: Diagnosis not present

## 2022-04-18 DIAGNOSIS — S2249XA Multiple fractures of ribs, unspecified side, initial encounter for closed fracture: Secondary | ICD-10-CM

## 2022-04-18 DIAGNOSIS — I2510 Atherosclerotic heart disease of native coronary artery without angina pectoris: Secondary | ICD-10-CM | POA: Diagnosis not present

## 2022-04-18 DIAGNOSIS — G4733 Obstructive sleep apnea (adult) (pediatric): Secondary | ICD-10-CM | POA: Diagnosis not present

## 2022-04-18 DIAGNOSIS — D696 Thrombocytopenia, unspecified: Secondary | ICD-10-CM

## 2022-04-18 DIAGNOSIS — F0394 Unspecified dementia, unspecified severity, with anxiety: Secondary | ICD-10-CM | POA: Diagnosis not present

## 2022-04-18 DIAGNOSIS — G4700 Insomnia, unspecified: Secondary | ICD-10-CM | POA: Diagnosis not present

## 2022-04-18 NOTE — Assessment & Plan Note (Signed)
Will discuss suspending coumadin with Dr Fletcher Anon given his frequency of falls

## 2022-04-18 NOTE — Assessment & Plan Note (Signed)
Great toenails have become painful and too thick to cut with standard equipment.  Referring to Dr Cleda Mccreedy per [facility] request

## 2022-04-18 NOTE — Assessment & Plan Note (Signed)
Discussed ways to improve his safety with [name] The current medical regimen is effective;  continue present plan and medications. aricept and namenda prescribed by Dr Melrose Nakayama.

## 2022-04-18 NOTE — Telephone Encounter (Signed)
CAn you let his sons [name] and Ed  know that Dr Fletcher Anon and I spoke and he agrees that the risks of continuing warfarin are starting to out weight the benefits of staying on it?  They can discuss further with him at his next visit  if they are unsure about stopping it

## 2022-04-18 NOTE — Assessment & Plan Note (Signed)
Resolved with continued hydration  Lab Results  Component Value Date   CREATININE 1.2 04/10/2022

## 2022-04-18 NOTE — Telephone Encounter (Signed)
LMTCB with pt's son Raquel Sarna.

## 2022-04-18 NOTE — Assessment & Plan Note (Signed)
Patient is stable post discharge and has no new issues or questions about discharge plans at the visit today for hospital follow up. All labs , imaging studies and progress notes from admission were reviewed with patient today   

## 2022-04-18 NOTE — Assessment & Plan Note (Addendum)
evaluated in May by [facility].  GI referral made May but patient and son state that they did not receive a call.  Reordered

## 2022-04-18 NOTE — Patient Instructions (Addendum)
For the bowel issues:  1) Get Benefiber and start taking it EVERY DAY  with a full glass of water,  (do not take with medications )  2) Also use glycerin suppositories to use when constipated . Insert BEFORE BOWEL MOVEMENT   3) gi REFERRAL IN PROCESS:  WE MAY NOT NEED IT  BUT IT WILL TAKE MONTHS    4) Referral to podiatry to manage the toenails   5) I WILL DISCUSS WITH  DR Fletcher Anon THE OPTION OF STOPPING WARFARIN

## 2022-04-18 NOTE — Assessment & Plan Note (Addendum)
Right posterior ribs 8-11 or 8-10   were fractured during a fall on August 13  While anticoagulated with coumadin

## 2022-04-18 NOTE — Progress Notes (Signed)
Subjective:  Patient ID: Jeffery Reynolds, male    DOB: [birth year]  Age: 88 y.o. MRN: [id number]  CC: The primary encounter diagnosis was Dystrophic nail. Diagnoses of AKI (acute kidney injury) ([place]), Multiple rib fractures involving four or more ribs, Bright red blood per rectum, Anticoagulated on Coumadin, Dementia without behavioral disturbance ([place]), and Hospital discharge follow-up were also pertinent to this visit.   HPI [name] presents for HOSPITAL FOLLOW UP Chief Complaint  Patient presents with   Hospitalization Follow-up   Mr Jeffery Reynolds is is an 88 year old male last seen via virtual visit in August 2022.  He has a history of  mild to moderate dementia,  hyperlipidemia, hypertension, and atrial fibrillation, on warfarin, and lives with his son Jeffery Reynolds.  He  was admitted to [facility] on August 14 after falling in the shower at home with fractures of several right posterior ribs 8 through 10 (confirmed by CT chest, abd and pelvis)   complicated by acute kidney failure .  He was found lying in the shower by his son , who estimates that he had fallen 30 minutes prior to being found.  Marland Kitchen  He was treated for pain and dehydration sent home the following day with an rx for hydrocodone and advised to have a repeat BMET after 3 days, which was done as directed and showed recovery of renal function . Other imaging studies done during the admission  plain films of hip, pelvis and shoulder.    He does not remember the fall. He is accompanied by his other son, Jeffery Reynolds.  (Jeffery Reynolds does not drive)  .  He is using the incentive  spirometery,  receiving PT at home .  He is not using the hydrocodone for pain management because he has not complained of pain (per son) until today in the office.  He has severe bilateral DJD knees which limits his mobility. He  is not using the walker in the house but st.ates that he uses it out of the home. Marland Kitchen  He has a shower bench in his shower.  He has had 3 previous falls this year ,  all resulting in  ER visits.  1) once occurred at the gym; he slipped off the bench a the gym.  His Glasses lacerated his nose and he required stitches.  On another occasion, he lost his  balance at [facility] while turning  and face planted requiring 6 stitches to forehead.    He has 24/7 supervision by son Jeffery Reynolds,  except when Jeffery Reynolds goes to the [employer] .  The most recent fall occurred in the shower which was unsupervised (Jeffery Reynolds was asleep)   2) Chronic diarrhea : episodic,  but is explosive  and has hindered/limited him family's ability to take him out of the house,  He alternates between constipation and diarrhea, and has had fecal incontinence duirng diarrhea episodes. .     Outpatient Medications Prior to Visit  Medication Sig Dispense Refill   allopurinol (ZYLOPRIM) 300 MG tablet TAKE 1 TABLET ON MONDAY, WEDNESDAY AND FRIDAY 36 tablet 1   atorvastatin (LIPITOR) 40 MG tablet Take 1 tablet (40 mg total) by mouth daily. After 6 pm 90 tablet 3   donepezil (ARICEPT) 5 MG tablet Take 2.5 mg by mouth at bedtime.     furosemide (LASIX) 20 MG tablet TAKE 1 TABLET BY MOUTH EVERY OTHER DAY FOR FLUID RETENTION 45 tablet 2   metoprolol tartrate (LOPRESSOR) 50 MG tablet TAKE 1 TABLET BY  MOUTH TWICE A DAY 180 tablet 1   warfarin (COUMADIN) 5 MG tablet 1 TAB DAILY AT 6 PM. ALL DAYS EXCEPT FOR MONDAY AND FRIDAY. MONDAY AND FRIDAY PT TAKES 2.5 MG. 90 tablet 1   HYDROcodone-acetaminophen (NORCO/VICODIN) 5-325 MG tablet Take 1 tablet by mouth every 6 (six) hours as needed for moderate pain or severe pain. (Patient not taking: Reported on 04/18/2022) 30 tablet 0   lidocaine (LIDODERM) 5 % Place 1 patch onto the skin daily. Remove & Discard patch within 12 hours or as directed by MD (Patient not taking: Reported on 04/18/2022) 30 patch 0   No facility-administered medications prior to visit.    Review of Systems;  Patient denies headache, fevers, malaise, unintentional weight loss, skin rash, eye pain, sinus  congestion and sinus pain, sore throat, dysphagia,  hemoptysis , cough, dyspnea, wheezing, chest pain, palpitations, orthopnea, edema, abdominal pain, nausea, melena, diarrhea, constipation, flank pain, dysuria, hematuria, urinary  Frequency, nocturia, numbness, tingling, seizures,  Focal weakness, Loss of consciousness,  Tremor, insomnia, depression, anxiety, and suicidal ideation.      Objective:  BP 118/66 (BP Location: Left Arm, Patient Position: Sitting, Cuff Size: Normal)   Pulse 82   Temp 98.1 F (36.7 C) (Oral)   Ht '5\' 8"'$  (1.727 m)   Wt 186 lb (84.4 kg)   SpO2 98%   BMI 28.28 kg/m   BP Readings from Last 3 Encounters:  04/18/22 118/66  04/08/22 128/68  01/17/22 110/70    Wt Readings from Last 3 Encounters:  04/18/22 186 lb (84.4 kg)  04/07/22 190 lb (86.2 kg)  01/17/22 183 lb (83 kg)    General appearance: alert, cooperative and appears stated age Ears: normal TM's and external ear canals both ears Throat: lips, mucosa, and tongue normal; teeth and gums normal Neck: no adenopathy, no carotid bruit, supple, symmetrical, trachea midline and thyroid not enlarged, symmetric, no tenderness/mass/nodules Back: symmetric, no curvature. ROM normal. Right sided chest wall tenderness . Lungs: clear to auscultation bilaterally Heart: regular rate and rhythm, S1, S2 normal, no murmur, click, rub or gallop Abdomen: soft, non-tender; bowel sounds normal; no masses,  no organomegaly Pulses: 2+ and symmetric Skin: Skin color, texture, turgor normal. No rashes or lesions Lymph nodes: Cervical, supraclavicular, and axillary nodes normal. Ext:  dystrophic, thickened toenails  Lab Results  Component Value Date   HGBA1C 5.4 09/05/2019   HGBA1C 5.4 09/24/2018   HGBA1C 5.4 01/26/2017    Lab Results  Component Value Date   CREATININE 1.2 04/10/2022   CREATININE 2.02 (H) 04/08/2022   CREATININE 2.28 (H) 04/07/2022    Lab Results  Component Value Date   WBC 6.2 04/08/2022   HGB  11.3 (L) 04/08/2022   HCT 33.9 (L) 04/08/2022   PLT 66 (L) 04/08/2022   GLUCOSE 134 (H) 04/08/2022   CHOL 120 05/02/2021   TRIG 156 (H) 05/02/2021   HDL 42 05/02/2021   LDLDIRECT 65.0 04/07/2019   LDLCALC 51 05/02/2021   ALT 49 (H) 04/07/2022   AST 95 (H) 04/07/2022   NA 142 04/10/2022   K 3.8 04/10/2022   CL 105 04/10/2022   CREATININE 1.2 04/10/2022   BUN 25 (A) 04/10/2022   CO2 25 (A) 04/10/2022   TSH 2.43 09/11/2015   INR 2.3 04/16/2022   HGBA1C 5.4 09/05/2019    CT CHEST ABDOMEN PELVIS WO CONTRAST  Result Date: 04/07/2022 CLINICAL DATA:  Poly trauma, blunt trauma, elevated creatinine EXAM: CT CHEST, ABDOMEN AND PELVIS WITHOUT CONTRAST TECHNIQUE: Multidetector  CT imaging of the chest, abdomen and pelvis was performed following the standard protocol without IV contrast. RADIATION DOSE REDUCTION: This exam was performed according to the departmental dose-optimization program which includes automated exposure control, adjustment of the mA and/or kV according to patient size and/or use of iterative reconstruction technique. COMPARISON:  CT angio chest 10/21/2021 FINDINGS: CT CHEST FINDINGS Cardiovascular: Atherosclerotic calcifications aorta, coronary arteries, and proximal great vessels. Post CABG. Aorta upper normal caliber. No pericardial effusion. Mediastinum/Nodes: Base of cervical region unremarkable. Mediastinum clear without hemorrhage or mass. No thoracic adenopathy. Esophagus normal appearance. Lungs/Pleura: Subsegmental atelectasis in RIGHT lower lobe. Lungs otherwise clear. No pulmonary infiltrate, pleural effusion, or pneumothorax. Musculoskeletal: Minimally displaced fractures of the posterolateral RIGHT ninth and tenth ribs. Nondisplaced fracture posterolateral RIGHT eighth rib. CT ABDOMEN PELVIS FINDINGS Hepatobiliary: Gallbladder surgically absent.  Liver unremarkable. Pancreas: Normal appearance Spleen: Normal appearance Adrenals/Urinary Tract: Adrenal glands, kidneys,  ureters, and bladder normal appearance Stomach/Bowel: Minimal sigmoid diverticulosis without evidence of diverticulitis. Stomach and bowel loops otherwise normal appearance. Normal appendix. Vascular/Lymphatic: Atherosclerotic calcifications aorta and iliac arteries without aneurysm. No adenopathy. Reproductive: Minimal prostatic enlargement Other: Small BILATERAL inguinal hernias containing fat. No free air or free fluid. Musculoskeletal: Osseous demineralization. Nondisplaced fracture of distal RIGHT eleventh rib. IMPRESSION: Fractures of posterolateral RIGHT eighth through eleventh ribs. Subsegmental atelectasis in RIGHT lower lobe. Minimal prostatic enlargement. Small BILATERAL inguinal hernias containing fat. Minimal sigmoid diverticulosis. No acute intra-abdominal or intrapelvic abnormalities. Aortic Atherosclerosis (ICD10-I70.0). Electronically Signed   By: Lavonia Dana M.D.   On: 04/07/2022 18:54   DG Ribs Unilateral W/Chest Right  Result Date: 04/07/2022 CLINICAL DATA:  Trauma, fall EXAM: RIGHT RIBS AND CHEST - 3+ VIEW COMPARISON:  None Available. FINDINGS: There is nondisplaced fracture in the lateral end of right eleventh rib. The soft tissue prominence in the right lateral abdominal wall. Surgical clips are seen in right side of abdomen. There is previous coronary bypass surgery. IMPRESSION: Undisplaced fracture is seen in the lateral end of right eleventh rib. There is soft tissue thickening in the right lateral abdominal wall. This may be due to contusion or hematoma in the abdominal wall. If clinically warranted, follow-up CT may be considered. Electronically Signed   By: Elmer Picker M.D.   On: 04/07/2022 16:43   DG Hip Unilat W or Wo Pelvis 2-3 Views Right  Result Date: 04/07/2022 CLINICAL DATA:  Trauma, fall EXAM: DG HIP (WITH OR WITHOUT PELVIS) 2-3V RIGHT COMPARISON:  None Available. FINDINGS: No displaced fracture or dislocation is seen. Minimal bony spurs are seen. Arterial  calcifications are seen in soft tissues. Marked degenerative changes are noted in the visualized lower lumbar spine. IMPRESSION: No fracture or dislocation is seen in right hip. Electronically Signed   By: Elmer Picker M.D.   On: 04/07/2022 16:40   DG Shoulder Right  Result Date: 04/07/2022 CLINICAL DATA:  Trauma, fall EXAM: RIGHT SHOULDER - 2+ VIEW COMPARISON:  None Available. FINDINGS: No displaced fracture or dislocation is seen. There is a metallic anchor in proximal humerus from previous surgery. Small bony spurs are noted. No abnormal soft tissue calcifications are seen. There is evidence of previous coronary bypass surgery. IMPRESSION: No recent fracture or dislocation is seen in right shoulder. Postsurgical and degenerative changes are noted in right shoulder. Electronically Signed   By: Elmer Picker M.D.   On: 04/07/2022 16:39    Assessment & Plan:   Problem List Items Addressed This Visit     AKI (acute kidney injury) ([place])  Resolved with continued hydration  Lab Results  Component Value Date   CREATININE 1.2 04/10/2022         Anticoagulated on Coumadin    Will discuss suspending coumadin with Dr Fletcher Anon given his frequency of falls       Bright red blood per rectum    evaluated in May by [facility].  GI referral made May but patient and son state that they did not receive a call.  Reordered        Relevant Orders   Ambulatory referral to Gastroenterology   Dementia without behavioral disturbance [facility])    Discussed ways to improve his safety with Jeffery Reynolds The current medical regimen is effective;  continue present plan and medications. aricept and namenda prescribed by Dr Melrose Nakayama.       Dystrophic nail - Primary    Great toenails have become painful and too thick to cut with standard equipment.  Referring to Dr Cleda Mccreedy per Sean's request      Relevant Orders   Ambulatory referral to [facility] discharge follow-up    Patient is stable post discharge and  has no new issues or questions about discharge plans at the visit today for hospital follow up. All labs , imaging studies and progress notes from admission were reviewed with patient today        Multiple rib fractures involving four or more ribs    Right posterior ribs 8-11 or 8-10   were fractured during a fall on August 13  While anticoagulated with coumadin        Follow-up: No follow-ups on file.   Crecencio Mc, MD

## 2022-04-19 ENCOUNTER — Other Ambulatory Visit: Payer: Self-pay | Admitting: Internal Medicine

## 2022-04-22 DIAGNOSIS — I482 Chronic atrial fibrillation, unspecified: Secondary | ICD-10-CM | POA: Diagnosis not present

## 2022-04-22 DIAGNOSIS — S2241XD Multiple fractures of ribs, right side, subsequent encounter for fracture with routine healing: Secondary | ICD-10-CM | POA: Diagnosis not present

## 2022-04-22 DIAGNOSIS — F0394 Unspecified dementia, unspecified severity, with anxiety: Secondary | ICD-10-CM | POA: Diagnosis not present

## 2022-04-22 DIAGNOSIS — I2510 Atherosclerotic heart disease of native coronary artery without angina pectoris: Secondary | ICD-10-CM | POA: Diagnosis not present

## 2022-04-22 DIAGNOSIS — M19011 Primary osteoarthritis, right shoulder: Secondary | ICD-10-CM | POA: Diagnosis not present

## 2022-04-22 DIAGNOSIS — I119 Hypertensive heart disease without heart failure: Secondary | ICD-10-CM | POA: Diagnosis not present

## 2022-04-24 DIAGNOSIS — I2510 Atherosclerotic heart disease of native coronary artery without angina pectoris: Secondary | ICD-10-CM | POA: Diagnosis not present

## 2022-04-24 DIAGNOSIS — F0394 Unspecified dementia, unspecified severity, with anxiety: Secondary | ICD-10-CM | POA: Diagnosis not present

## 2022-04-24 DIAGNOSIS — I119 Hypertensive heart disease without heart failure: Secondary | ICD-10-CM | POA: Diagnosis not present

## 2022-04-24 DIAGNOSIS — M19011 Primary osteoarthritis, right shoulder: Secondary | ICD-10-CM | POA: Diagnosis not present

## 2022-04-24 DIAGNOSIS — I482 Chronic atrial fibrillation, unspecified: Secondary | ICD-10-CM | POA: Diagnosis not present

## 2022-04-24 DIAGNOSIS — S2241XD Multiple fractures of ribs, right side, subsequent encounter for fracture with routine healing: Secondary | ICD-10-CM | POA: Diagnosis not present

## 2022-04-26 DIAGNOSIS — I2510 Atherosclerotic heart disease of native coronary artery without angina pectoris: Secondary | ICD-10-CM | POA: Diagnosis not present

## 2022-04-26 DIAGNOSIS — S2241XD Multiple fractures of ribs, right side, subsequent encounter for fracture with routine healing: Secondary | ICD-10-CM | POA: Diagnosis not present

## 2022-04-26 DIAGNOSIS — I119 Hypertensive heart disease without heart failure: Secondary | ICD-10-CM | POA: Diagnosis not present

## 2022-04-26 DIAGNOSIS — F0394 Unspecified dementia, unspecified severity, with anxiety: Secondary | ICD-10-CM | POA: Diagnosis not present

## 2022-04-26 DIAGNOSIS — M19011 Primary osteoarthritis, right shoulder: Secondary | ICD-10-CM | POA: Diagnosis not present

## 2022-04-26 DIAGNOSIS — I482 Chronic atrial fibrillation, unspecified: Secondary | ICD-10-CM | POA: Diagnosis not present

## 2022-05-12 DIAGNOSIS — H6123 Impacted cerumen, bilateral: Secondary | ICD-10-CM | POA: Diagnosis not present

## 2022-05-12 DIAGNOSIS — H9193 Unspecified hearing loss, bilateral: Secondary | ICD-10-CM | POA: Diagnosis not present

## 2022-05-14 ENCOUNTER — Ambulatory Visit: Payer: Medicare Other | Attending: Cardiovascular Disease

## 2022-05-14 DIAGNOSIS — Z5181 Encounter for therapeutic drug level monitoring: Secondary | ICD-10-CM

## 2022-05-14 DIAGNOSIS — I4819 Other persistent atrial fibrillation: Secondary | ICD-10-CM | POA: Insufficient documentation

## 2022-05-14 LAB — POCT INR: INR: 1.7 — AB (ref 2.0–3.0)

## 2022-05-14 NOTE — Patient Instructions (Signed)
TAKE 1 TABLET TODAY ONLY and then  continue taking warfarin 1 tablet daily except for 1/2 a tablet on WEDNESDAYS - Recheck INR in 4 weeks

## 2022-05-23 ENCOUNTER — Encounter: Payer: Self-pay | Admitting: Internal Medicine

## 2022-05-28 ENCOUNTER — Other Ambulatory Visit: Payer: Self-pay | Admitting: Internal Medicine

## 2022-06-11 ENCOUNTER — Ambulatory Visit: Payer: Medicare Other | Attending: Cardiovascular Disease

## 2022-06-11 DIAGNOSIS — Z5181 Encounter for therapeutic drug level monitoring: Secondary | ICD-10-CM | POA: Diagnosis not present

## 2022-06-11 DIAGNOSIS — I4819 Other persistent atrial fibrillation: Secondary | ICD-10-CM

## 2022-06-11 LAB — POCT INR: INR: 1.9 — AB (ref 2.0–3.0)

## 2022-06-11 NOTE — Patient Instructions (Signed)
TAKE 1 TABLET TODAY ONLY and 1.5 tablets THURSDAY ONLY and then  continue taking warfarin 1 tablet daily except for 1/2 a tablet on WEDNESDAYS - Recheck INR in 4 weeks

## 2022-07-02 ENCOUNTER — Ambulatory Visit: Payer: Medicare Other

## 2022-07-02 ENCOUNTER — Telehealth: Payer: Self-pay | Admitting: Cardiovascular Disease

## 2022-07-02 ENCOUNTER — Ambulatory Visit: Payer: Medicare Other | Attending: Internal Medicine

## 2022-07-02 DIAGNOSIS — Z5181 Encounter for therapeutic drug level monitoring: Secondary | ICD-10-CM | POA: Diagnosis not present

## 2022-07-02 DIAGNOSIS — I482 Chronic atrial fibrillation, unspecified: Secondary | ICD-10-CM

## 2022-07-02 LAB — POCT INR: INR: 2.6 (ref 2.0–3.0)

## 2022-07-02 NOTE — Patient Instructions (Signed)
continue taking warfarin 1 tablet daily except for 1/2 a tablet on WEDNESDAYS - Recheck INR in 6 weeks

## 2022-07-02 NOTE — Telephone Encounter (Signed)
Son called stating patient is having diarrhea today and will be going out of town next week.  Son would like a call back if the patient can be seen later today.

## 2022-07-23 ENCOUNTER — Ambulatory Visit: Payer: Medicare Other | Admitting: Internal Medicine

## 2022-07-29 DIAGNOSIS — J209 Acute bronchitis, unspecified: Secondary | ICD-10-CM | POA: Diagnosis not present

## 2022-07-29 DIAGNOSIS — F0390 Unspecified dementia without behavioral disturbance: Secondary | ICD-10-CM | POA: Diagnosis not present

## 2022-07-29 DIAGNOSIS — R4182 Altered mental status, unspecified: Secondary | ICD-10-CM | POA: Diagnosis not present

## 2022-07-29 DIAGNOSIS — I2510 Atherosclerotic heart disease of native coronary artery without angina pectoris: Secondary | ICD-10-CM | POA: Diagnosis not present

## 2022-07-29 DIAGNOSIS — R55 Syncope and collapse: Secondary | ICD-10-CM | POA: Diagnosis not present

## 2022-07-29 DIAGNOSIS — N3946 Mixed incontinence: Secondary | ICD-10-CM | POA: Diagnosis not present

## 2022-07-29 DIAGNOSIS — M1990 Unspecified osteoarthritis, unspecified site: Secondary | ICD-10-CM | POA: Diagnosis not present

## 2022-07-29 DIAGNOSIS — Z20822 Contact with and (suspected) exposure to covid-19: Secondary | ICD-10-CM | POA: Diagnosis not present

## 2022-07-29 DIAGNOSIS — Z79899 Other long term (current) drug therapy: Secondary | ICD-10-CM | POA: Diagnosis not present

## 2022-07-29 DIAGNOSIS — I509 Heart failure, unspecified: Secondary | ICD-10-CM | POA: Diagnosis not present

## 2022-07-29 DIAGNOSIS — R296 Repeated falls: Secondary | ICD-10-CM | POA: Diagnosis not present

## 2022-07-29 DIAGNOSIS — F0280 Dementia in other diseases classified elsewhere without behavioral disturbance: Secondary | ICD-10-CM | POA: Diagnosis not present

## 2022-07-29 DIAGNOSIS — R0600 Dyspnea, unspecified: Secondary | ICD-10-CM | POA: Diagnosis not present

## 2022-07-29 DIAGNOSIS — E860 Dehydration: Secondary | ICD-10-CM | POA: Diagnosis not present

## 2022-07-29 DIAGNOSIS — I10 Essential (primary) hypertension: Secondary | ICD-10-CM | POA: Diagnosis not present

## 2022-07-29 DIAGNOSIS — H05011 Cellulitis of right orbit: Secondary | ICD-10-CM | POA: Diagnosis not present

## 2022-08-01 DIAGNOSIS — J069 Acute upper respiratory infection, unspecified: Secondary | ICD-10-CM | POA: Diagnosis not present

## 2022-08-01 DIAGNOSIS — I10 Essential (primary) hypertension: Secondary | ICD-10-CM | POA: Diagnosis not present

## 2022-08-01 DIAGNOSIS — E860 Dehydration: Secondary | ICD-10-CM | POA: Diagnosis not present

## 2022-08-01 DIAGNOSIS — H6123 Impacted cerumen, bilateral: Secondary | ICD-10-CM | POA: Diagnosis not present

## 2022-08-01 DIAGNOSIS — Z7982 Long term (current) use of aspirin: Secondary | ICD-10-CM | POA: Diagnosis not present

## 2022-08-01 DIAGNOSIS — R296 Repeated falls: Secondary | ICD-10-CM | POA: Diagnosis not present

## 2022-08-01 DIAGNOSIS — F0280 Dementia in other diseases classified elsewhere without behavioral disturbance: Secondary | ICD-10-CM | POA: Diagnosis not present

## 2022-08-01 DIAGNOSIS — N3946 Mixed incontinence: Secondary | ICD-10-CM | POA: Diagnosis not present

## 2022-08-01 DIAGNOSIS — G309 Alzheimer's disease, unspecified: Secondary | ICD-10-CM | POA: Diagnosis not present

## 2022-08-02 DIAGNOSIS — H6123 Impacted cerumen, bilateral: Secondary | ICD-10-CM | POA: Diagnosis not present

## 2022-08-02 DIAGNOSIS — R296 Repeated falls: Secondary | ICD-10-CM | POA: Diagnosis not present

## 2022-08-02 DIAGNOSIS — F0280 Dementia in other diseases classified elsewhere without behavioral disturbance: Secondary | ICD-10-CM | POA: Diagnosis not present

## 2022-08-02 DIAGNOSIS — E860 Dehydration: Secondary | ICD-10-CM | POA: Diagnosis not present

## 2022-08-02 DIAGNOSIS — I10 Essential (primary) hypertension: Secondary | ICD-10-CM | POA: Diagnosis not present

## 2022-08-02 DIAGNOSIS — G309 Alzheimer's disease, unspecified: Secondary | ICD-10-CM | POA: Diagnosis not present

## 2022-08-04 DIAGNOSIS — H6123 Impacted cerumen, bilateral: Secondary | ICD-10-CM | POA: Diagnosis not present

## 2022-08-04 DIAGNOSIS — E860 Dehydration: Secondary | ICD-10-CM | POA: Diagnosis not present

## 2022-08-04 DIAGNOSIS — F0280 Dementia in other diseases classified elsewhere without behavioral disturbance: Secondary | ICD-10-CM | POA: Diagnosis not present

## 2022-08-04 DIAGNOSIS — I10 Essential (primary) hypertension: Secondary | ICD-10-CM | POA: Diagnosis not present

## 2022-08-04 DIAGNOSIS — R296 Repeated falls: Secondary | ICD-10-CM | POA: Diagnosis not present

## 2022-08-04 DIAGNOSIS — G309 Alzheimer's disease, unspecified: Secondary | ICD-10-CM | POA: Diagnosis not present

## 2022-08-07 DIAGNOSIS — G309 Alzheimer's disease, unspecified: Secondary | ICD-10-CM | POA: Diagnosis not present

## 2022-08-07 DIAGNOSIS — E860 Dehydration: Secondary | ICD-10-CM | POA: Diagnosis not present

## 2022-08-07 DIAGNOSIS — F0280 Dementia in other diseases classified elsewhere without behavioral disturbance: Secondary | ICD-10-CM | POA: Diagnosis not present

## 2022-08-07 DIAGNOSIS — H6123 Impacted cerumen, bilateral: Secondary | ICD-10-CM | POA: Diagnosis not present

## 2022-08-07 DIAGNOSIS — R296 Repeated falls: Secondary | ICD-10-CM | POA: Diagnosis not present

## 2022-08-07 DIAGNOSIS — I10 Essential (primary) hypertension: Secondary | ICD-10-CM | POA: Diagnosis not present

## 2022-08-09 DIAGNOSIS — F0280 Dementia in other diseases classified elsewhere without behavioral disturbance: Secondary | ICD-10-CM | POA: Diagnosis not present

## 2022-08-09 DIAGNOSIS — J069 Acute upper respiratory infection, unspecified: Secondary | ICD-10-CM | POA: Diagnosis not present

## 2022-08-09 DIAGNOSIS — N3946 Mixed incontinence: Secondary | ICD-10-CM | POA: Diagnosis not present

## 2022-08-11 DIAGNOSIS — F0280 Dementia in other diseases classified elsewhere without behavioral disturbance: Secondary | ICD-10-CM | POA: Diagnosis not present

## 2022-08-11 DIAGNOSIS — I10 Essential (primary) hypertension: Secondary | ICD-10-CM | POA: Diagnosis not present

## 2022-08-11 DIAGNOSIS — H6123 Impacted cerumen, bilateral: Secondary | ICD-10-CM | POA: Diagnosis not present

## 2022-08-11 DIAGNOSIS — E860 Dehydration: Secondary | ICD-10-CM | POA: Diagnosis not present

## 2022-08-11 DIAGNOSIS — G309 Alzheimer's disease, unspecified: Secondary | ICD-10-CM | POA: Diagnosis not present

## 2022-08-11 DIAGNOSIS — R296 Repeated falls: Secondary | ICD-10-CM | POA: Diagnosis not present

## 2022-08-13 DIAGNOSIS — E860 Dehydration: Secondary | ICD-10-CM | POA: Diagnosis not present

## 2022-08-13 DIAGNOSIS — G309 Alzheimer's disease, unspecified: Secondary | ICD-10-CM | POA: Diagnosis not present

## 2022-08-13 DIAGNOSIS — H6123 Impacted cerumen, bilateral: Secondary | ICD-10-CM | POA: Diagnosis not present

## 2022-08-13 DIAGNOSIS — R296 Repeated falls: Secondary | ICD-10-CM | POA: Diagnosis not present

## 2022-08-13 DIAGNOSIS — F0280 Dementia in other diseases classified elsewhere without behavioral disturbance: Secondary | ICD-10-CM | POA: Diagnosis not present

## 2022-08-13 DIAGNOSIS — I10 Essential (primary) hypertension: Secondary | ICD-10-CM | POA: Diagnosis not present

## 2022-08-14 DIAGNOSIS — F0280 Dementia in other diseases classified elsewhere without behavioral disturbance: Secondary | ICD-10-CM | POA: Diagnosis not present

## 2022-08-14 DIAGNOSIS — H6123 Impacted cerumen, bilateral: Secondary | ICD-10-CM | POA: Diagnosis not present

## 2022-08-14 DIAGNOSIS — R000 Tachycardia, unspecified: Secondary | ICD-10-CM | POA: Diagnosis not present

## 2022-08-14 DIAGNOSIS — F02B0 Dementia in other diseases classified elsewhere, moderate, without behavioral disturbance, psychotic disturbance, mood disturbance, and anxiety: Secondary | ICD-10-CM | POA: Diagnosis not present

## 2022-08-14 DIAGNOSIS — G309 Alzheimer's disease, unspecified: Secondary | ICD-10-CM | POA: Diagnosis not present

## 2022-08-14 DIAGNOSIS — E860 Dehydration: Secondary | ICD-10-CM | POA: Diagnosis not present

## 2022-08-14 DIAGNOSIS — R296 Repeated falls: Secondary | ICD-10-CM | POA: Diagnosis not present

## 2022-08-14 DIAGNOSIS — J069 Acute upper respiratory infection, unspecified: Secondary | ICD-10-CM | POA: Diagnosis not present

## 2022-08-14 DIAGNOSIS — I10 Essential (primary) hypertension: Secondary | ICD-10-CM | POA: Diagnosis not present

## 2022-08-20 ENCOUNTER — Telehealth: Payer: Self-pay

## 2022-08-20 DIAGNOSIS — I10 Essential (primary) hypertension: Secondary | ICD-10-CM | POA: Diagnosis not present

## 2022-08-20 DIAGNOSIS — H6123 Impacted cerumen, bilateral: Secondary | ICD-10-CM | POA: Diagnosis not present

## 2022-08-20 DIAGNOSIS — R296 Repeated falls: Secondary | ICD-10-CM | POA: Diagnosis not present

## 2022-08-20 DIAGNOSIS — G309 Alzheimer's disease, unspecified: Secondary | ICD-10-CM | POA: Diagnosis not present

## 2022-08-20 DIAGNOSIS — E860 Dehydration: Secondary | ICD-10-CM | POA: Diagnosis not present

## 2022-08-20 DIAGNOSIS — F0280 Dementia in other diseases classified elsewhere without behavioral disturbance: Secondary | ICD-10-CM | POA: Diagnosis not present

## 2022-08-20 NOTE — Telephone Encounter (Signed)
Lp's son message to call back and schedule INR appt.

## 2022-08-21 DIAGNOSIS — I10 Essential (primary) hypertension: Secondary | ICD-10-CM | POA: Diagnosis not present

## 2022-08-21 DIAGNOSIS — F0280 Dementia in other diseases classified elsewhere without behavioral disturbance: Secondary | ICD-10-CM | POA: Diagnosis not present

## 2022-08-21 DIAGNOSIS — H6123 Impacted cerumen, bilateral: Secondary | ICD-10-CM | POA: Diagnosis not present

## 2022-08-21 DIAGNOSIS — G309 Alzheimer's disease, unspecified: Secondary | ICD-10-CM | POA: Diagnosis not present

## 2022-08-21 DIAGNOSIS — R296 Repeated falls: Secondary | ICD-10-CM | POA: Diagnosis not present

## 2022-08-21 DIAGNOSIS — E860 Dehydration: Secondary | ICD-10-CM | POA: Diagnosis not present

## 2022-08-22 DIAGNOSIS — H6123 Impacted cerumen, bilateral: Secondary | ICD-10-CM | POA: Diagnosis not present

## 2022-08-22 DIAGNOSIS — E860 Dehydration: Secondary | ICD-10-CM | POA: Diagnosis not present

## 2022-08-22 DIAGNOSIS — G309 Alzheimer's disease, unspecified: Secondary | ICD-10-CM | POA: Diagnosis not present

## 2022-08-22 DIAGNOSIS — R296 Repeated falls: Secondary | ICD-10-CM | POA: Diagnosis not present

## 2022-08-22 DIAGNOSIS — F0280 Dementia in other diseases classified elsewhere without behavioral disturbance: Secondary | ICD-10-CM | POA: Diagnosis not present

## 2022-08-22 DIAGNOSIS — I10 Essential (primary) hypertension: Secondary | ICD-10-CM | POA: Diagnosis not present

## 2022-08-26 DIAGNOSIS — I10 Essential (primary) hypertension: Secondary | ICD-10-CM | POA: Diagnosis not present

## 2022-08-26 DIAGNOSIS — F0280 Dementia in other diseases classified elsewhere without behavioral disturbance: Secondary | ICD-10-CM | POA: Diagnosis not present

## 2022-08-26 DIAGNOSIS — H6123 Impacted cerumen, bilateral: Secondary | ICD-10-CM | POA: Diagnosis not present

## 2022-08-26 DIAGNOSIS — G309 Alzheimer's disease, unspecified: Secondary | ICD-10-CM | POA: Diagnosis not present

## 2022-08-26 DIAGNOSIS — E860 Dehydration: Secondary | ICD-10-CM | POA: Diagnosis not present

## 2022-08-26 DIAGNOSIS — R296 Repeated falls: Secondary | ICD-10-CM | POA: Diagnosis not present

## 2022-08-27 DIAGNOSIS — I10 Essential (primary) hypertension: Secondary | ICD-10-CM | POA: Diagnosis not present

## 2022-08-27 DIAGNOSIS — R296 Repeated falls: Secondary | ICD-10-CM | POA: Diagnosis not present

## 2022-08-27 DIAGNOSIS — F0280 Dementia in other diseases classified elsewhere without behavioral disturbance: Secondary | ICD-10-CM | POA: Diagnosis not present

## 2022-08-27 DIAGNOSIS — E860 Dehydration: Secondary | ICD-10-CM | POA: Diagnosis not present

## 2022-08-27 DIAGNOSIS — H6123 Impacted cerumen, bilateral: Secondary | ICD-10-CM | POA: Diagnosis not present

## 2022-08-27 DIAGNOSIS — G309 Alzheimer's disease, unspecified: Secondary | ICD-10-CM | POA: Diagnosis not present

## 2022-08-28 DIAGNOSIS — F0280 Dementia in other diseases classified elsewhere without behavioral disturbance: Secondary | ICD-10-CM | POA: Diagnosis not present

## 2022-08-28 DIAGNOSIS — R296 Repeated falls: Secondary | ICD-10-CM | POA: Diagnosis not present

## 2022-08-28 DIAGNOSIS — G309 Alzheimer's disease, unspecified: Secondary | ICD-10-CM | POA: Diagnosis not present

## 2022-08-28 DIAGNOSIS — H6123 Impacted cerumen, bilateral: Secondary | ICD-10-CM | POA: Diagnosis not present

## 2022-08-28 DIAGNOSIS — E860 Dehydration: Secondary | ICD-10-CM | POA: Diagnosis not present

## 2022-08-28 DIAGNOSIS — I10 Essential (primary) hypertension: Secondary | ICD-10-CM | POA: Diagnosis not present

## 2022-08-29 DIAGNOSIS — I4891 Unspecified atrial fibrillation: Secondary | ICD-10-CM | POA: Diagnosis not present

## 2022-08-29 DIAGNOSIS — G309 Alzheimer's disease, unspecified: Secondary | ICD-10-CM | POA: Diagnosis not present

## 2022-08-29 DIAGNOSIS — I509 Heart failure, unspecified: Secondary | ICD-10-CM | POA: Diagnosis not present

## 2022-08-29 DIAGNOSIS — F0280 Dementia in other diseases classified elsewhere without behavioral disturbance: Secondary | ICD-10-CM | POA: Diagnosis not present

## 2022-08-29 DIAGNOSIS — I2510 Atherosclerotic heart disease of native coronary artery without angina pectoris: Secondary | ICD-10-CM | POA: Diagnosis not present

## 2022-09-01 DIAGNOSIS — F0280 Dementia in other diseases classified elsewhere without behavioral disturbance: Secondary | ICD-10-CM | POA: Diagnosis not present

## 2022-09-01 DIAGNOSIS — I4891 Unspecified atrial fibrillation: Secondary | ICD-10-CM | POA: Diagnosis not present

## 2022-09-01 DIAGNOSIS — I509 Heart failure, unspecified: Secondary | ICD-10-CM | POA: Diagnosis not present

## 2022-09-01 DIAGNOSIS — G309 Alzheimer's disease, unspecified: Secondary | ICD-10-CM | POA: Diagnosis not present

## 2022-09-01 DIAGNOSIS — I2510 Atherosclerotic heart disease of native coronary artery without angina pectoris: Secondary | ICD-10-CM | POA: Diagnosis not present

## 2022-09-04 DIAGNOSIS — G309 Alzheimer's disease, unspecified: Secondary | ICD-10-CM | POA: Diagnosis not present

## 2022-09-04 DIAGNOSIS — I2510 Atherosclerotic heart disease of native coronary artery without angina pectoris: Secondary | ICD-10-CM | POA: Diagnosis not present

## 2022-09-04 DIAGNOSIS — I509 Heart failure, unspecified: Secondary | ICD-10-CM | POA: Diagnosis not present

## 2022-09-04 DIAGNOSIS — F0280 Dementia in other diseases classified elsewhere without behavioral disturbance: Secondary | ICD-10-CM | POA: Diagnosis not present

## 2022-09-04 DIAGNOSIS — I4891 Unspecified atrial fibrillation: Secondary | ICD-10-CM | POA: Diagnosis not present

## 2022-09-06 ENCOUNTER — Other Ambulatory Visit: Payer: Self-pay | Admitting: Family

## 2022-09-08 DIAGNOSIS — I509 Heart failure, unspecified: Secondary | ICD-10-CM | POA: Diagnosis not present

## 2022-09-08 DIAGNOSIS — G309 Alzheimer's disease, unspecified: Secondary | ICD-10-CM | POA: Diagnosis not present

## 2022-09-08 DIAGNOSIS — F0280 Dementia in other diseases classified elsewhere without behavioral disturbance: Secondary | ICD-10-CM | POA: Diagnosis not present

## 2022-09-08 DIAGNOSIS — I2510 Atherosclerotic heart disease of native coronary artery without angina pectoris: Secondary | ICD-10-CM | POA: Diagnosis not present

## 2022-09-08 DIAGNOSIS — I4891 Unspecified atrial fibrillation: Secondary | ICD-10-CM | POA: Diagnosis not present

## 2022-09-10 DIAGNOSIS — G309 Alzheimer's disease, unspecified: Secondary | ICD-10-CM | POA: Diagnosis not present

## 2022-09-10 DIAGNOSIS — I509 Heart failure, unspecified: Secondary | ICD-10-CM | POA: Diagnosis not present

## 2022-09-10 DIAGNOSIS — F0280 Dementia in other diseases classified elsewhere without behavioral disturbance: Secondary | ICD-10-CM | POA: Diagnosis not present

## 2022-09-10 DIAGNOSIS — I2510 Atherosclerotic heart disease of native coronary artery without angina pectoris: Secondary | ICD-10-CM | POA: Diagnosis not present

## 2022-09-10 DIAGNOSIS — I4891 Unspecified atrial fibrillation: Secondary | ICD-10-CM | POA: Diagnosis not present

## 2022-09-11 DIAGNOSIS — F0280 Dementia in other diseases classified elsewhere without behavioral disturbance: Secondary | ICD-10-CM | POA: Diagnosis not present

## 2022-09-11 DIAGNOSIS — I2510 Atherosclerotic heart disease of native coronary artery without angina pectoris: Secondary | ICD-10-CM | POA: Diagnosis not present

## 2022-09-11 DIAGNOSIS — I4891 Unspecified atrial fibrillation: Secondary | ICD-10-CM | POA: Diagnosis not present

## 2022-09-11 DIAGNOSIS — I509 Heart failure, unspecified: Secondary | ICD-10-CM | POA: Diagnosis not present

## 2022-09-11 DIAGNOSIS — G309 Alzheimer's disease, unspecified: Secondary | ICD-10-CM | POA: Diagnosis not present

## 2022-09-12 DIAGNOSIS — G309 Alzheimer's disease, unspecified: Secondary | ICD-10-CM | POA: Diagnosis not present

## 2022-09-12 DIAGNOSIS — I4891 Unspecified atrial fibrillation: Secondary | ICD-10-CM | POA: Diagnosis not present

## 2022-09-12 DIAGNOSIS — I509 Heart failure, unspecified: Secondary | ICD-10-CM | POA: Diagnosis not present

## 2022-09-12 DIAGNOSIS — I2510 Atherosclerotic heart disease of native coronary artery without angina pectoris: Secondary | ICD-10-CM | POA: Diagnosis not present

## 2022-09-12 DIAGNOSIS — F0280 Dementia in other diseases classified elsewhere without behavioral disturbance: Secondary | ICD-10-CM | POA: Diagnosis not present

## 2022-09-15 DIAGNOSIS — I4891 Unspecified atrial fibrillation: Secondary | ICD-10-CM | POA: Diagnosis not present

## 2022-09-15 DIAGNOSIS — G309 Alzheimer's disease, unspecified: Secondary | ICD-10-CM | POA: Diagnosis not present

## 2022-09-15 DIAGNOSIS — F0280 Dementia in other diseases classified elsewhere without behavioral disturbance: Secondary | ICD-10-CM | POA: Diagnosis not present

## 2022-09-15 DIAGNOSIS — I2510 Atherosclerotic heart disease of native coronary artery without angina pectoris: Secondary | ICD-10-CM | POA: Diagnosis not present

## 2022-09-15 DIAGNOSIS — I509 Heart failure, unspecified: Secondary | ICD-10-CM | POA: Diagnosis not present

## 2022-09-17 DIAGNOSIS — F0280 Dementia in other diseases classified elsewhere without behavioral disturbance: Secondary | ICD-10-CM | POA: Diagnosis not present

## 2022-09-17 DIAGNOSIS — I4891 Unspecified atrial fibrillation: Secondary | ICD-10-CM | POA: Diagnosis not present

## 2022-09-17 DIAGNOSIS — I509 Heart failure, unspecified: Secondary | ICD-10-CM | POA: Diagnosis not present

## 2022-09-17 DIAGNOSIS — I2510 Atherosclerotic heart disease of native coronary artery without angina pectoris: Secondary | ICD-10-CM | POA: Diagnosis not present

## 2022-09-17 DIAGNOSIS — G309 Alzheimer's disease, unspecified: Secondary | ICD-10-CM | POA: Diagnosis not present

## 2022-09-18 DIAGNOSIS — I4891 Unspecified atrial fibrillation: Secondary | ICD-10-CM | POA: Diagnosis not present

## 2022-09-18 DIAGNOSIS — F0280 Dementia in other diseases classified elsewhere without behavioral disturbance: Secondary | ICD-10-CM | POA: Diagnosis not present

## 2022-09-18 DIAGNOSIS — G309 Alzheimer's disease, unspecified: Secondary | ICD-10-CM | POA: Diagnosis not present

## 2022-09-18 DIAGNOSIS — I509 Heart failure, unspecified: Secondary | ICD-10-CM | POA: Diagnosis not present

## 2022-09-18 DIAGNOSIS — I2510 Atherosclerotic heart disease of native coronary artery without angina pectoris: Secondary | ICD-10-CM | POA: Diagnosis not present

## 2022-09-19 DIAGNOSIS — G309 Alzheimer's disease, unspecified: Secondary | ICD-10-CM | POA: Diagnosis not present

## 2022-09-19 DIAGNOSIS — I509 Heart failure, unspecified: Secondary | ICD-10-CM | POA: Diagnosis not present

## 2022-09-19 DIAGNOSIS — I4891 Unspecified atrial fibrillation: Secondary | ICD-10-CM | POA: Diagnosis not present

## 2022-09-19 DIAGNOSIS — F0280 Dementia in other diseases classified elsewhere without behavioral disturbance: Secondary | ICD-10-CM | POA: Diagnosis not present

## 2022-09-19 DIAGNOSIS — I2510 Atherosclerotic heart disease of native coronary artery without angina pectoris: Secondary | ICD-10-CM | POA: Diagnosis not present

## 2022-09-22 DIAGNOSIS — F0280 Dementia in other diseases classified elsewhere without behavioral disturbance: Secondary | ICD-10-CM | POA: Diagnosis not present

## 2022-09-22 DIAGNOSIS — I509 Heart failure, unspecified: Secondary | ICD-10-CM | POA: Diagnosis not present

## 2022-09-22 DIAGNOSIS — I2510 Atherosclerotic heart disease of native coronary artery without angina pectoris: Secondary | ICD-10-CM | POA: Diagnosis not present

## 2022-09-22 DIAGNOSIS — I4891 Unspecified atrial fibrillation: Secondary | ICD-10-CM | POA: Diagnosis not present

## 2022-09-22 DIAGNOSIS — G309 Alzheimer's disease, unspecified: Secondary | ICD-10-CM | POA: Diagnosis not present

## 2022-09-25 DIAGNOSIS — G309 Alzheimer's disease, unspecified: Secondary | ICD-10-CM | POA: Diagnosis not present

## 2022-09-25 DIAGNOSIS — I2510 Atherosclerotic heart disease of native coronary artery without angina pectoris: Secondary | ICD-10-CM | POA: Diagnosis not present

## 2022-09-25 DIAGNOSIS — F0280 Dementia in other diseases classified elsewhere without behavioral disturbance: Secondary | ICD-10-CM | POA: Diagnosis not present

## 2022-09-25 DIAGNOSIS — I4891 Unspecified atrial fibrillation: Secondary | ICD-10-CM | POA: Diagnosis not present

## 2022-09-25 DIAGNOSIS — I509 Heart failure, unspecified: Secondary | ICD-10-CM | POA: Diagnosis not present

## 2022-09-26 DIAGNOSIS — G309 Alzheimer's disease, unspecified: Secondary | ICD-10-CM | POA: Diagnosis not present

## 2022-09-26 DIAGNOSIS — I2510 Atherosclerotic heart disease of native coronary artery without angina pectoris: Secondary | ICD-10-CM | POA: Diagnosis not present

## 2022-09-26 DIAGNOSIS — I509 Heart failure, unspecified: Secondary | ICD-10-CM | POA: Diagnosis not present

## 2022-09-26 DIAGNOSIS — I4891 Unspecified atrial fibrillation: Secondary | ICD-10-CM | POA: Diagnosis not present

## 2022-09-26 DIAGNOSIS — F0280 Dementia in other diseases classified elsewhere without behavioral disturbance: Secondary | ICD-10-CM | POA: Diagnosis not present

## 2022-09-29 DIAGNOSIS — G309 Alzheimer's disease, unspecified: Secondary | ICD-10-CM | POA: Diagnosis not present

## 2022-09-29 DIAGNOSIS — I4891 Unspecified atrial fibrillation: Secondary | ICD-10-CM | POA: Diagnosis not present

## 2022-09-29 DIAGNOSIS — I2510 Atherosclerotic heart disease of native coronary artery without angina pectoris: Secondary | ICD-10-CM | POA: Diagnosis not present

## 2022-09-29 DIAGNOSIS — F0280 Dementia in other diseases classified elsewhere without behavioral disturbance: Secondary | ICD-10-CM | POA: Diagnosis not present

## 2022-09-29 DIAGNOSIS — I509 Heart failure, unspecified: Secondary | ICD-10-CM | POA: Diagnosis not present

## 2022-10-01 DIAGNOSIS — J069 Acute upper respiratory infection, unspecified: Secondary | ICD-10-CM | POA: Diagnosis not present

## 2022-10-01 DIAGNOSIS — I509 Heart failure, unspecified: Secondary | ICD-10-CM | POA: Diagnosis not present

## 2022-10-01 DIAGNOSIS — I2510 Atherosclerotic heart disease of native coronary artery without angina pectoris: Secondary | ICD-10-CM | POA: Diagnosis not present

## 2022-10-01 DIAGNOSIS — I4891 Unspecified atrial fibrillation: Secondary | ICD-10-CM | POA: Diagnosis not present

## 2022-10-01 DIAGNOSIS — G309 Alzheimer's disease, unspecified: Secondary | ICD-10-CM | POA: Diagnosis not present

## 2022-10-01 DIAGNOSIS — F0280 Dementia in other diseases classified elsewhere without behavioral disturbance: Secondary | ICD-10-CM | POA: Diagnosis not present

## 2022-10-02 DIAGNOSIS — I4891 Unspecified atrial fibrillation: Secondary | ICD-10-CM | POA: Diagnosis not present

## 2022-10-02 DIAGNOSIS — I2510 Atherosclerotic heart disease of native coronary artery without angina pectoris: Secondary | ICD-10-CM | POA: Diagnosis not present

## 2022-10-02 DIAGNOSIS — G309 Alzheimer's disease, unspecified: Secondary | ICD-10-CM | POA: Diagnosis not present

## 2022-10-02 DIAGNOSIS — F0280 Dementia in other diseases classified elsewhere without behavioral disturbance: Secondary | ICD-10-CM | POA: Diagnosis not present

## 2022-10-02 DIAGNOSIS — I509 Heart failure, unspecified: Secondary | ICD-10-CM | POA: Diagnosis not present

## 2022-10-03 ENCOUNTER — Telehealth: Payer: Self-pay | Admitting: Internal Medicine

## 2022-10-03 DIAGNOSIS — F0280 Dementia in other diseases classified elsewhere without behavioral disturbance: Secondary | ICD-10-CM | POA: Diagnosis not present

## 2022-10-03 DIAGNOSIS — I509 Heart failure, unspecified: Secondary | ICD-10-CM | POA: Diagnosis not present

## 2022-10-03 DIAGNOSIS — I4891 Unspecified atrial fibrillation: Secondary | ICD-10-CM | POA: Diagnosis not present

## 2022-10-03 DIAGNOSIS — G309 Alzheimer's disease, unspecified: Secondary | ICD-10-CM | POA: Diagnosis not present

## 2022-10-03 DIAGNOSIS — I2510 Atherosclerotic heart disease of native coronary artery without angina pectoris: Secondary | ICD-10-CM | POA: Diagnosis not present

## 2022-10-03 NOTE — Telephone Encounter (Signed)
Left message for patient's son, Raquel Sarna, to call back and schedule Medicare Annual Wellness Visit (AWV).   Please offer to do by telephone.  Left office number and my jabber [phone].  Last AWV:09/30/2021   Please schedule at anytime with Nurse Health Advisor.

## 2022-10-06 DIAGNOSIS — F0280 Dementia in other diseases classified elsewhere without behavioral disturbance: Secondary | ICD-10-CM | POA: Diagnosis not present

## 2022-10-06 DIAGNOSIS — I2510 Atherosclerotic heart disease of native coronary artery without angina pectoris: Secondary | ICD-10-CM | POA: Diagnosis not present

## 2022-10-06 DIAGNOSIS — I4891 Unspecified atrial fibrillation: Secondary | ICD-10-CM | POA: Diagnosis not present

## 2022-10-06 DIAGNOSIS — I509 Heart failure, unspecified: Secondary | ICD-10-CM | POA: Diagnosis not present

## 2022-10-06 DIAGNOSIS — G309 Alzheimer's disease, unspecified: Secondary | ICD-10-CM | POA: Diagnosis not present

## 2022-10-09 DIAGNOSIS — I2510 Atherosclerotic heart disease of native coronary artery without angina pectoris: Secondary | ICD-10-CM | POA: Diagnosis not present

## 2022-10-09 DIAGNOSIS — I4891 Unspecified atrial fibrillation: Secondary | ICD-10-CM | POA: Diagnosis not present

## 2022-10-09 DIAGNOSIS — G309 Alzheimer's disease, unspecified: Secondary | ICD-10-CM | POA: Diagnosis not present

## 2022-10-09 DIAGNOSIS — I509 Heart failure, unspecified: Secondary | ICD-10-CM | POA: Diagnosis not present

## 2022-10-09 DIAGNOSIS — F0280 Dementia in other diseases classified elsewhere without behavioral disturbance: Secondary | ICD-10-CM | POA: Diagnosis not present

## 2022-10-10 DIAGNOSIS — G309 Alzheimer's disease, unspecified: Secondary | ICD-10-CM | POA: Diagnosis not present

## 2022-10-10 DIAGNOSIS — I2510 Atherosclerotic heart disease of native coronary artery without angina pectoris: Secondary | ICD-10-CM | POA: Diagnosis not present

## 2022-10-10 DIAGNOSIS — I509 Heart failure, unspecified: Secondary | ICD-10-CM | POA: Diagnosis not present

## 2022-10-10 DIAGNOSIS — I4891 Unspecified atrial fibrillation: Secondary | ICD-10-CM | POA: Diagnosis not present

## 2022-10-10 DIAGNOSIS — F0280 Dementia in other diseases classified elsewhere without behavioral disturbance: Secondary | ICD-10-CM | POA: Diagnosis not present

## 2022-10-13 DIAGNOSIS — G309 Alzheimer's disease, unspecified: Secondary | ICD-10-CM | POA: Diagnosis not present

## 2022-10-13 DIAGNOSIS — I4891 Unspecified atrial fibrillation: Secondary | ICD-10-CM | POA: Diagnosis not present

## 2022-10-13 DIAGNOSIS — I2510 Atherosclerotic heart disease of native coronary artery without angina pectoris: Secondary | ICD-10-CM | POA: Diagnosis not present

## 2022-10-13 DIAGNOSIS — I509 Heart failure, unspecified: Secondary | ICD-10-CM | POA: Diagnosis not present

## 2022-10-13 DIAGNOSIS — F0280 Dementia in other diseases classified elsewhere without behavioral disturbance: Secondary | ICD-10-CM | POA: Diagnosis not present

## 2022-10-15 ENCOUNTER — Telehealth: Payer: Self-pay | Admitting: Internal Medicine

## 2022-10-15 DIAGNOSIS — F0280 Dementia in other diseases classified elsewhere without behavioral disturbance: Secondary | ICD-10-CM | POA: Diagnosis not present

## 2022-10-15 DIAGNOSIS — G309 Alzheimer's disease, unspecified: Secondary | ICD-10-CM | POA: Diagnosis not present

## 2022-10-15 DIAGNOSIS — I2510 Atherosclerotic heart disease of native coronary artery without angina pectoris: Secondary | ICD-10-CM | POA: Diagnosis not present

## 2022-10-15 DIAGNOSIS — I509 Heart failure, unspecified: Secondary | ICD-10-CM | POA: Diagnosis not present

## 2022-10-15 DIAGNOSIS — I4891 Unspecified atrial fibrillation: Secondary | ICD-10-CM | POA: Diagnosis not present

## 2022-10-15 NOTE — Telephone Encounter (Signed)
Contacted Jeffery Reynolds to schedule their annual wellness visit. Patient declined to schedule AWV at this time.  Patient received my chart message to schedule AWV.  Patient's son called and said patient has moved to [place].  Please remove PCP.  Thank you,  [place] Direct dial  [phone]

## 2022-10-17 DIAGNOSIS — I509 Heart failure, unspecified: Secondary | ICD-10-CM | POA: Diagnosis not present

## 2022-10-17 DIAGNOSIS — G309 Alzheimer's disease, unspecified: Secondary | ICD-10-CM | POA: Diagnosis not present

## 2022-10-17 DIAGNOSIS — F0280 Dementia in other diseases classified elsewhere without behavioral disturbance: Secondary | ICD-10-CM | POA: Diagnosis not present

## 2022-10-17 DIAGNOSIS — I2510 Atherosclerotic heart disease of native coronary artery without angina pectoris: Secondary | ICD-10-CM | POA: Diagnosis not present

## 2022-10-17 DIAGNOSIS — I4891 Unspecified atrial fibrillation: Secondary | ICD-10-CM | POA: Diagnosis not present

## 2022-10-20 DIAGNOSIS — F0280 Dementia in other diseases classified elsewhere without behavioral disturbance: Secondary | ICD-10-CM | POA: Diagnosis not present

## 2022-10-20 DIAGNOSIS — I509 Heart failure, unspecified: Secondary | ICD-10-CM | POA: Diagnosis not present

## 2022-10-20 DIAGNOSIS — G309 Alzheimer's disease, unspecified: Secondary | ICD-10-CM | POA: Diagnosis not present

## 2022-10-20 DIAGNOSIS — I4891 Unspecified atrial fibrillation: Secondary | ICD-10-CM | POA: Diagnosis not present

## 2022-10-20 DIAGNOSIS — I2510 Atherosclerotic heart disease of native coronary artery without angina pectoris: Secondary | ICD-10-CM | POA: Diagnosis not present

## 2022-10-22 DIAGNOSIS — I4891 Unspecified atrial fibrillation: Secondary | ICD-10-CM | POA: Diagnosis not present

## 2022-10-22 DIAGNOSIS — I509 Heart failure, unspecified: Secondary | ICD-10-CM | POA: Diagnosis not present

## 2022-10-22 DIAGNOSIS — I2510 Atherosclerotic heart disease of native coronary artery without angina pectoris: Secondary | ICD-10-CM | POA: Diagnosis not present

## 2022-10-22 DIAGNOSIS — F0280 Dementia in other diseases classified elsewhere without behavioral disturbance: Secondary | ICD-10-CM | POA: Diagnosis not present

## 2022-10-22 DIAGNOSIS — G309 Alzheimer's disease, unspecified: Secondary | ICD-10-CM | POA: Diagnosis not present

## 2022-10-23 DIAGNOSIS — F0280 Dementia in other diseases classified elsewhere without behavioral disturbance: Secondary | ICD-10-CM | POA: Diagnosis not present

## 2022-10-23 DIAGNOSIS — I4891 Unspecified atrial fibrillation: Secondary | ICD-10-CM | POA: Diagnosis not present

## 2022-10-23 DIAGNOSIS — I2510 Atherosclerotic heart disease of native coronary artery without angina pectoris: Secondary | ICD-10-CM | POA: Diagnosis not present

## 2022-10-23 DIAGNOSIS — I509 Heart failure, unspecified: Secondary | ICD-10-CM | POA: Diagnosis not present

## 2022-10-23 DIAGNOSIS — G309 Alzheimer's disease, unspecified: Secondary | ICD-10-CM | POA: Diagnosis not present

## 2022-10-24 DIAGNOSIS — G309 Alzheimer's disease, unspecified: Secondary | ICD-10-CM | POA: Diagnosis not present

## 2022-10-24 DIAGNOSIS — I4891 Unspecified atrial fibrillation: Secondary | ICD-10-CM | POA: Diagnosis not present

## 2022-10-24 DIAGNOSIS — F0280 Dementia in other diseases classified elsewhere without behavioral disturbance: Secondary | ICD-10-CM | POA: Diagnosis not present

## 2022-10-24 DIAGNOSIS — I2510 Atherosclerotic heart disease of native coronary artery without angina pectoris: Secondary | ICD-10-CM | POA: Diagnosis not present

## 2022-10-24 DIAGNOSIS — I509 Heart failure, unspecified: Secondary | ICD-10-CM | POA: Diagnosis not present

## 2022-10-27 DIAGNOSIS — I509 Heart failure, unspecified: Secondary | ICD-10-CM | POA: Diagnosis not present

## 2022-10-27 DIAGNOSIS — I2510 Atherosclerotic heart disease of native coronary artery without angina pectoris: Secondary | ICD-10-CM | POA: Diagnosis not present

## 2022-10-27 DIAGNOSIS — G309 Alzheimer's disease, unspecified: Secondary | ICD-10-CM | POA: Diagnosis not present

## 2022-10-27 DIAGNOSIS — F0280 Dementia in other diseases classified elsewhere without behavioral disturbance: Secondary | ICD-10-CM | POA: Diagnosis not present

## 2022-10-27 DIAGNOSIS — I4891 Unspecified atrial fibrillation: Secondary | ICD-10-CM | POA: Diagnosis not present

## 2022-10-29 DIAGNOSIS — F0280 Dementia in other diseases classified elsewhere without behavioral disturbance: Secondary | ICD-10-CM | POA: Diagnosis not present

## 2022-10-29 DIAGNOSIS — I2510 Atherosclerotic heart disease of native coronary artery without angina pectoris: Secondary | ICD-10-CM | POA: Diagnosis not present

## 2022-10-29 DIAGNOSIS — I4891 Unspecified atrial fibrillation: Secondary | ICD-10-CM | POA: Diagnosis not present

## 2022-10-29 DIAGNOSIS — G309 Alzheimer's disease, unspecified: Secondary | ICD-10-CM | POA: Diagnosis not present

## 2022-10-29 DIAGNOSIS — I509 Heart failure, unspecified: Secondary | ICD-10-CM | POA: Diagnosis not present

## 2022-10-31 DIAGNOSIS — F0280 Dementia in other diseases classified elsewhere without behavioral disturbance: Secondary | ICD-10-CM | POA: Diagnosis not present

## 2022-10-31 DIAGNOSIS — I2510 Atherosclerotic heart disease of native coronary artery without angina pectoris: Secondary | ICD-10-CM | POA: Diagnosis not present

## 2022-10-31 DIAGNOSIS — G309 Alzheimer's disease, unspecified: Secondary | ICD-10-CM | POA: Diagnosis not present

## 2022-10-31 DIAGNOSIS — I509 Heart failure, unspecified: Secondary | ICD-10-CM | POA: Diagnosis not present

## 2022-10-31 DIAGNOSIS — I4891 Unspecified atrial fibrillation: Secondary | ICD-10-CM | POA: Diagnosis not present

## 2022-11-03 DIAGNOSIS — I4891 Unspecified atrial fibrillation: Secondary | ICD-10-CM | POA: Diagnosis not present

## 2022-11-03 DIAGNOSIS — I2510 Atherosclerotic heart disease of native coronary artery without angina pectoris: Secondary | ICD-10-CM | POA: Diagnosis not present

## 2022-11-03 DIAGNOSIS — I509 Heart failure, unspecified: Secondary | ICD-10-CM | POA: Diagnosis not present

## 2022-11-03 DIAGNOSIS — F0280 Dementia in other diseases classified elsewhere without behavioral disturbance: Secondary | ICD-10-CM | POA: Diagnosis not present

## 2022-11-03 DIAGNOSIS — G309 Alzheimer's disease, unspecified: Secondary | ICD-10-CM | POA: Diagnosis not present

## 2022-11-05 DIAGNOSIS — I509 Heart failure, unspecified: Secondary | ICD-10-CM | POA: Diagnosis not present

## 2022-11-05 DIAGNOSIS — G309 Alzheimer's disease, unspecified: Secondary | ICD-10-CM | POA: Diagnosis not present

## 2022-11-05 DIAGNOSIS — I2510 Atherosclerotic heart disease of native coronary artery without angina pectoris: Secondary | ICD-10-CM | POA: Diagnosis not present

## 2022-11-05 DIAGNOSIS — I4891 Unspecified atrial fibrillation: Secondary | ICD-10-CM | POA: Diagnosis not present

## 2022-11-05 DIAGNOSIS — F0280 Dementia in other diseases classified elsewhere without behavioral disturbance: Secondary | ICD-10-CM | POA: Diagnosis not present

## 2022-11-06 DIAGNOSIS — I4891 Unspecified atrial fibrillation: Secondary | ICD-10-CM | POA: Diagnosis not present

## 2022-11-06 DIAGNOSIS — I2510 Atherosclerotic heart disease of native coronary artery without angina pectoris: Secondary | ICD-10-CM | POA: Diagnosis not present

## 2022-11-06 DIAGNOSIS — F0280 Dementia in other diseases classified elsewhere without behavioral disturbance: Secondary | ICD-10-CM | POA: Diagnosis not present

## 2022-11-06 DIAGNOSIS — I509 Heart failure, unspecified: Secondary | ICD-10-CM | POA: Diagnosis not present

## 2022-11-06 DIAGNOSIS — G309 Alzheimer's disease, unspecified: Secondary | ICD-10-CM | POA: Diagnosis not present

## 2022-11-07 DIAGNOSIS — G309 Alzheimer's disease, unspecified: Secondary | ICD-10-CM | POA: Diagnosis not present

## 2022-11-07 DIAGNOSIS — F0280 Dementia in other diseases classified elsewhere without behavioral disturbance: Secondary | ICD-10-CM | POA: Diagnosis not present

## 2022-11-07 DIAGNOSIS — I2510 Atherosclerotic heart disease of native coronary artery without angina pectoris: Secondary | ICD-10-CM | POA: Diagnosis not present

## 2022-11-07 DIAGNOSIS — I4891 Unspecified atrial fibrillation: Secondary | ICD-10-CM | POA: Diagnosis not present

## 2022-11-07 DIAGNOSIS — I509 Heart failure, unspecified: Secondary | ICD-10-CM | POA: Diagnosis not present

## 2022-11-10 DIAGNOSIS — I2510 Atherosclerotic heart disease of native coronary artery without angina pectoris: Secondary | ICD-10-CM | POA: Diagnosis not present

## 2022-11-10 DIAGNOSIS — G309 Alzheimer's disease, unspecified: Secondary | ICD-10-CM | POA: Diagnosis not present

## 2022-11-10 DIAGNOSIS — F0280 Dementia in other diseases classified elsewhere without behavioral disturbance: Secondary | ICD-10-CM | POA: Diagnosis not present

## 2022-11-10 DIAGNOSIS — I509 Heart failure, unspecified: Secondary | ICD-10-CM | POA: Diagnosis not present

## 2022-11-10 DIAGNOSIS — I4891 Unspecified atrial fibrillation: Secondary | ICD-10-CM | POA: Diagnosis not present

## 2022-11-12 DIAGNOSIS — I2510 Atherosclerotic heart disease of native coronary artery without angina pectoris: Secondary | ICD-10-CM | POA: Diagnosis not present

## 2022-11-12 DIAGNOSIS — F0280 Dementia in other diseases classified elsewhere without behavioral disturbance: Secondary | ICD-10-CM | POA: Diagnosis not present

## 2022-11-12 DIAGNOSIS — I509 Heart failure, unspecified: Secondary | ICD-10-CM | POA: Diagnosis not present

## 2022-11-12 DIAGNOSIS — G309 Alzheimer's disease, unspecified: Secondary | ICD-10-CM | POA: Diagnosis not present

## 2022-11-12 DIAGNOSIS — I4891 Unspecified atrial fibrillation: Secondary | ICD-10-CM | POA: Diagnosis not present

## 2022-11-13 DIAGNOSIS — I509 Heart failure, unspecified: Secondary | ICD-10-CM | POA: Diagnosis not present

## 2022-11-13 DIAGNOSIS — F0280 Dementia in other diseases classified elsewhere without behavioral disturbance: Secondary | ICD-10-CM | POA: Diagnosis not present

## 2022-11-13 DIAGNOSIS — G309 Alzheimer's disease, unspecified: Secondary | ICD-10-CM | POA: Diagnosis not present

## 2022-11-13 DIAGNOSIS — I2510 Atherosclerotic heart disease of native coronary artery without angina pectoris: Secondary | ICD-10-CM | POA: Diagnosis not present

## 2022-11-13 DIAGNOSIS — I4891 Unspecified atrial fibrillation: Secondary | ICD-10-CM | POA: Diagnosis not present

## 2022-11-14 DIAGNOSIS — F0280 Dementia in other diseases classified elsewhere without behavioral disturbance: Secondary | ICD-10-CM | POA: Diagnosis not present

## 2022-11-14 DIAGNOSIS — I2510 Atherosclerotic heart disease of native coronary artery without angina pectoris: Secondary | ICD-10-CM | POA: Diagnosis not present

## 2022-11-14 DIAGNOSIS — G309 Alzheimer's disease, unspecified: Secondary | ICD-10-CM | POA: Diagnosis not present

## 2022-11-14 DIAGNOSIS — I4891 Unspecified atrial fibrillation: Secondary | ICD-10-CM | POA: Diagnosis not present

## 2022-11-14 DIAGNOSIS — I509 Heart failure, unspecified: Secondary | ICD-10-CM | POA: Diagnosis not present

## 2022-11-17 DIAGNOSIS — I4891 Unspecified atrial fibrillation: Secondary | ICD-10-CM | POA: Diagnosis not present

## 2022-11-17 DIAGNOSIS — I2510 Atherosclerotic heart disease of native coronary artery without angina pectoris: Secondary | ICD-10-CM | POA: Diagnosis not present

## 2022-11-17 DIAGNOSIS — F0280 Dementia in other diseases classified elsewhere without behavioral disturbance: Secondary | ICD-10-CM | POA: Diagnosis not present

## 2022-11-17 DIAGNOSIS — G309 Alzheimer's disease, unspecified: Secondary | ICD-10-CM | POA: Diagnosis not present

## 2022-11-17 DIAGNOSIS — I509 Heart failure, unspecified: Secondary | ICD-10-CM | POA: Diagnosis not present

## 2022-11-18 DIAGNOSIS — I4891 Unspecified atrial fibrillation: Secondary | ICD-10-CM | POA: Diagnosis not present

## 2022-11-18 DIAGNOSIS — F0280 Dementia in other diseases classified elsewhere without behavioral disturbance: Secondary | ICD-10-CM | POA: Diagnosis not present

## 2022-11-18 DIAGNOSIS — G309 Alzheimer's disease, unspecified: Secondary | ICD-10-CM | POA: Diagnosis not present

## 2022-11-18 DIAGNOSIS — I2510 Atherosclerotic heart disease of native coronary artery without angina pectoris: Secondary | ICD-10-CM | POA: Diagnosis not present

## 2022-11-18 DIAGNOSIS — I509 Heart failure, unspecified: Secondary | ICD-10-CM | POA: Diagnosis not present

## 2022-11-20 DIAGNOSIS — I4891 Unspecified atrial fibrillation: Secondary | ICD-10-CM | POA: Diagnosis not present

## 2022-11-20 DIAGNOSIS — I2510 Atherosclerotic heart disease of native coronary artery without angina pectoris: Secondary | ICD-10-CM | POA: Diagnosis not present

## 2022-11-20 DIAGNOSIS — F0280 Dementia in other diseases classified elsewhere without behavioral disturbance: Secondary | ICD-10-CM | POA: Diagnosis not present

## 2022-11-20 DIAGNOSIS — I509 Heart failure, unspecified: Secondary | ICD-10-CM | POA: Diagnosis not present

## 2022-11-20 DIAGNOSIS — G309 Alzheimer's disease, unspecified: Secondary | ICD-10-CM | POA: Diagnosis not present

## 2022-11-21 DIAGNOSIS — I4891 Unspecified atrial fibrillation: Secondary | ICD-10-CM | POA: Diagnosis not present

## 2022-11-21 DIAGNOSIS — I2510 Atherosclerotic heart disease of native coronary artery without angina pectoris: Secondary | ICD-10-CM | POA: Diagnosis not present

## 2022-11-21 DIAGNOSIS — I509 Heart failure, unspecified: Secondary | ICD-10-CM | POA: Diagnosis not present

## 2022-11-21 DIAGNOSIS — F0280 Dementia in other diseases classified elsewhere without behavioral disturbance: Secondary | ICD-10-CM | POA: Diagnosis not present

## 2022-11-21 DIAGNOSIS — G309 Alzheimer's disease, unspecified: Secondary | ICD-10-CM | POA: Diagnosis not present

## 2022-11-24 DIAGNOSIS — I509 Heart failure, unspecified: Secondary | ICD-10-CM | POA: Diagnosis not present

## 2022-11-24 DIAGNOSIS — I4891 Unspecified atrial fibrillation: Secondary | ICD-10-CM | POA: Diagnosis not present

## 2022-11-24 DIAGNOSIS — F0280 Dementia in other diseases classified elsewhere without behavioral disturbance: Secondary | ICD-10-CM | POA: Diagnosis not present

## 2022-11-24 DIAGNOSIS — I2510 Atherosclerotic heart disease of native coronary artery without angina pectoris: Secondary | ICD-10-CM | POA: Diagnosis not present

## 2022-11-24 DIAGNOSIS — G309 Alzheimer's disease, unspecified: Secondary | ICD-10-CM | POA: Diagnosis not present

## 2022-11-25 DIAGNOSIS — G309 Alzheimer's disease, unspecified: Secondary | ICD-10-CM | POA: Diagnosis not present

## 2022-11-25 DIAGNOSIS — I509 Heart failure, unspecified: Secondary | ICD-10-CM | POA: Diagnosis not present

## 2022-11-25 DIAGNOSIS — I2510 Atherosclerotic heart disease of native coronary artery without angina pectoris: Secondary | ICD-10-CM | POA: Diagnosis not present

## 2022-11-25 DIAGNOSIS — F0280 Dementia in other diseases classified elsewhere without behavioral disturbance: Secondary | ICD-10-CM | POA: Diagnosis not present

## 2022-11-25 DIAGNOSIS — I4891 Unspecified atrial fibrillation: Secondary | ICD-10-CM | POA: Diagnosis not present

## 2022-11-26 DIAGNOSIS — I4891 Unspecified atrial fibrillation: Secondary | ICD-10-CM | POA: Diagnosis not present

## 2022-11-26 DIAGNOSIS — I509 Heart failure, unspecified: Secondary | ICD-10-CM | POA: Diagnosis not present

## 2022-11-26 DIAGNOSIS — G309 Alzheimer's disease, unspecified: Secondary | ICD-10-CM | POA: Diagnosis not present

## 2022-11-26 DIAGNOSIS — I2510 Atherosclerotic heart disease of native coronary artery without angina pectoris: Secondary | ICD-10-CM | POA: Diagnosis not present

## 2022-11-26 DIAGNOSIS — F0280 Dementia in other diseases classified elsewhere without behavioral disturbance: Secondary | ICD-10-CM | POA: Diagnosis not present

## 2022-11-28 DIAGNOSIS — I4891 Unspecified atrial fibrillation: Secondary | ICD-10-CM | POA: Diagnosis not present

## 2022-11-28 DIAGNOSIS — G309 Alzheimer's disease, unspecified: Secondary | ICD-10-CM | POA: Diagnosis not present

## 2022-11-28 DIAGNOSIS — I2510 Atherosclerotic heart disease of native coronary artery without angina pectoris: Secondary | ICD-10-CM | POA: Diagnosis not present

## 2022-11-28 DIAGNOSIS — I509 Heart failure, unspecified: Secondary | ICD-10-CM | POA: Diagnosis not present

## 2022-11-28 DIAGNOSIS — F0280 Dementia in other diseases classified elsewhere without behavioral disturbance: Secondary | ICD-10-CM | POA: Diagnosis not present

## 2022-12-01 DIAGNOSIS — I4891 Unspecified atrial fibrillation: Secondary | ICD-10-CM | POA: Diagnosis not present

## 2022-12-01 DIAGNOSIS — F0280 Dementia in other diseases classified elsewhere without behavioral disturbance: Secondary | ICD-10-CM | POA: Diagnosis not present

## 2022-12-01 DIAGNOSIS — G309 Alzheimer's disease, unspecified: Secondary | ICD-10-CM | POA: Diagnosis not present

## 2022-12-01 DIAGNOSIS — I509 Heart failure, unspecified: Secondary | ICD-10-CM | POA: Diagnosis not present

## 2022-12-01 DIAGNOSIS — I2510 Atherosclerotic heart disease of native coronary artery without angina pectoris: Secondary | ICD-10-CM | POA: Diagnosis not present

## 2022-12-04 DIAGNOSIS — I2510 Atherosclerotic heart disease of native coronary artery without angina pectoris: Secondary | ICD-10-CM | POA: Diagnosis not present

## 2022-12-04 DIAGNOSIS — I4891 Unspecified atrial fibrillation: Secondary | ICD-10-CM | POA: Diagnosis not present

## 2022-12-04 DIAGNOSIS — G309 Alzheimer's disease, unspecified: Secondary | ICD-10-CM | POA: Diagnosis not present

## 2022-12-04 DIAGNOSIS — I509 Heart failure, unspecified: Secondary | ICD-10-CM | POA: Diagnosis not present

## 2022-12-04 DIAGNOSIS — F0280 Dementia in other diseases classified elsewhere without behavioral disturbance: Secondary | ICD-10-CM | POA: Diagnosis not present

## 2022-12-05 DIAGNOSIS — G309 Alzheimer's disease, unspecified: Secondary | ICD-10-CM | POA: Diagnosis not present

## 2022-12-05 DIAGNOSIS — F0280 Dementia in other diseases classified elsewhere without behavioral disturbance: Secondary | ICD-10-CM | POA: Diagnosis not present

## 2022-12-05 DIAGNOSIS — I4891 Unspecified atrial fibrillation: Secondary | ICD-10-CM | POA: Diagnosis not present

## 2022-12-05 DIAGNOSIS — I509 Heart failure, unspecified: Secondary | ICD-10-CM | POA: Diagnosis not present

## 2022-12-05 DIAGNOSIS — I2510 Atherosclerotic heart disease of native coronary artery without angina pectoris: Secondary | ICD-10-CM | POA: Diagnosis not present

## 2022-12-08 DIAGNOSIS — G309 Alzheimer's disease, unspecified: Secondary | ICD-10-CM | POA: Diagnosis not present

## 2022-12-08 DIAGNOSIS — F0280 Dementia in other diseases classified elsewhere without behavioral disturbance: Secondary | ICD-10-CM | POA: Diagnosis not present

## 2022-12-08 DIAGNOSIS — I509 Heart failure, unspecified: Secondary | ICD-10-CM | POA: Diagnosis not present

## 2022-12-08 DIAGNOSIS — I2510 Atherosclerotic heart disease of native coronary artery without angina pectoris: Secondary | ICD-10-CM | POA: Diagnosis not present

## 2022-12-08 DIAGNOSIS — I4891 Unspecified atrial fibrillation: Secondary | ICD-10-CM | POA: Diagnosis not present

## 2022-12-09 DIAGNOSIS — G309 Alzheimer's disease, unspecified: Secondary | ICD-10-CM | POA: Diagnosis not present

## 2022-12-09 DIAGNOSIS — I2510 Atherosclerotic heart disease of native coronary artery without angina pectoris: Secondary | ICD-10-CM | POA: Diagnosis not present

## 2022-12-09 DIAGNOSIS — I4891 Unspecified atrial fibrillation: Secondary | ICD-10-CM | POA: Diagnosis not present

## 2022-12-09 DIAGNOSIS — F0280 Dementia in other diseases classified elsewhere without behavioral disturbance: Secondary | ICD-10-CM | POA: Diagnosis not present

## 2022-12-09 DIAGNOSIS — I509 Heart failure, unspecified: Secondary | ICD-10-CM | POA: Diagnosis not present

## 2022-12-11 DIAGNOSIS — I2510 Atherosclerotic heart disease of native coronary artery without angina pectoris: Secondary | ICD-10-CM | POA: Diagnosis not present

## 2022-12-11 DIAGNOSIS — G309 Alzheimer's disease, unspecified: Secondary | ICD-10-CM | POA: Diagnosis not present

## 2022-12-11 DIAGNOSIS — I4891 Unspecified atrial fibrillation: Secondary | ICD-10-CM | POA: Diagnosis not present

## 2022-12-11 DIAGNOSIS — I509 Heart failure, unspecified: Secondary | ICD-10-CM | POA: Diagnosis not present

## 2022-12-11 DIAGNOSIS — F0280 Dementia in other diseases classified elsewhere without behavioral disturbance: Secondary | ICD-10-CM | POA: Diagnosis not present

## 2022-12-12 DIAGNOSIS — I509 Heart failure, unspecified: Secondary | ICD-10-CM | POA: Diagnosis not present

## 2022-12-12 DIAGNOSIS — G309 Alzheimer's disease, unspecified: Secondary | ICD-10-CM | POA: Diagnosis not present

## 2022-12-12 DIAGNOSIS — I2510 Atherosclerotic heart disease of native coronary artery without angina pectoris: Secondary | ICD-10-CM | POA: Diagnosis not present

## 2022-12-12 DIAGNOSIS — I4891 Unspecified atrial fibrillation: Secondary | ICD-10-CM | POA: Diagnosis not present

## 2022-12-12 DIAGNOSIS — F0280 Dementia in other diseases classified elsewhere without behavioral disturbance: Secondary | ICD-10-CM | POA: Diagnosis not present

## 2022-12-15 DIAGNOSIS — I2510 Atherosclerotic heart disease of native coronary artery without angina pectoris: Secondary | ICD-10-CM | POA: Diagnosis not present

## 2022-12-15 DIAGNOSIS — G309 Alzheimer's disease, unspecified: Secondary | ICD-10-CM | POA: Diagnosis not present

## 2022-12-15 DIAGNOSIS — I4891 Unspecified atrial fibrillation: Secondary | ICD-10-CM | POA: Diagnosis not present

## 2022-12-15 DIAGNOSIS — I509 Heart failure, unspecified: Secondary | ICD-10-CM | POA: Diagnosis not present

## 2022-12-15 DIAGNOSIS — F0280 Dementia in other diseases classified elsewhere without behavioral disturbance: Secondary | ICD-10-CM | POA: Diagnosis not present

## 2022-12-17 DIAGNOSIS — I4891 Unspecified atrial fibrillation: Secondary | ICD-10-CM | POA: Diagnosis not present

## 2022-12-17 DIAGNOSIS — I2510 Atherosclerotic heart disease of native coronary artery without angina pectoris: Secondary | ICD-10-CM | POA: Diagnosis not present

## 2022-12-17 DIAGNOSIS — G309 Alzheimer's disease, unspecified: Secondary | ICD-10-CM | POA: Diagnosis not present

## 2022-12-17 DIAGNOSIS — I509 Heart failure, unspecified: Secondary | ICD-10-CM | POA: Diagnosis not present

## 2022-12-17 DIAGNOSIS — F0280 Dementia in other diseases classified elsewhere without behavioral disturbance: Secondary | ICD-10-CM | POA: Diagnosis not present

## 2022-12-19 DIAGNOSIS — I4891 Unspecified atrial fibrillation: Secondary | ICD-10-CM | POA: Diagnosis not present

## 2022-12-19 DIAGNOSIS — I509 Heart failure, unspecified: Secondary | ICD-10-CM | POA: Diagnosis not present

## 2022-12-19 DIAGNOSIS — F0280 Dementia in other diseases classified elsewhere without behavioral disturbance: Secondary | ICD-10-CM | POA: Diagnosis not present

## 2022-12-19 DIAGNOSIS — I2510 Atherosclerotic heart disease of native coronary artery without angina pectoris: Secondary | ICD-10-CM | POA: Diagnosis not present

## 2022-12-19 DIAGNOSIS — G309 Alzheimer's disease, unspecified: Secondary | ICD-10-CM | POA: Diagnosis not present

## 2022-12-22 DIAGNOSIS — I2510 Atherosclerotic heart disease of native coronary artery without angina pectoris: Secondary | ICD-10-CM | POA: Diagnosis not present

## 2022-12-22 DIAGNOSIS — I509 Heart failure, unspecified: Secondary | ICD-10-CM | POA: Diagnosis not present

## 2022-12-22 DIAGNOSIS — G309 Alzheimer's disease, unspecified: Secondary | ICD-10-CM | POA: Diagnosis not present

## 2022-12-22 DIAGNOSIS — I4891 Unspecified atrial fibrillation: Secondary | ICD-10-CM | POA: Diagnosis not present

## 2022-12-22 DIAGNOSIS — F0280 Dementia in other diseases classified elsewhere without behavioral disturbance: Secondary | ICD-10-CM | POA: Diagnosis not present

## 2022-12-23 DIAGNOSIS — I2510 Atherosclerotic heart disease of native coronary artery without angina pectoris: Secondary | ICD-10-CM | POA: Diagnosis not present

## 2022-12-23 DIAGNOSIS — I509 Heart failure, unspecified: Secondary | ICD-10-CM | POA: Diagnosis not present

## 2022-12-23 DIAGNOSIS — F0280 Dementia in other diseases classified elsewhere without behavioral disturbance: Secondary | ICD-10-CM | POA: Diagnosis not present

## 2022-12-23 DIAGNOSIS — I4891 Unspecified atrial fibrillation: Secondary | ICD-10-CM | POA: Diagnosis not present

## 2022-12-23 DIAGNOSIS — G309 Alzheimer's disease, unspecified: Secondary | ICD-10-CM | POA: Diagnosis not present

## 2022-12-24 DIAGNOSIS — G309 Alzheimer's disease, unspecified: Secondary | ICD-10-CM | POA: Diagnosis not present

## 2022-12-24 DIAGNOSIS — I2510 Atherosclerotic heart disease of native coronary artery without angina pectoris: Secondary | ICD-10-CM | POA: Diagnosis not present

## 2022-12-24 DIAGNOSIS — F0280 Dementia in other diseases classified elsewhere without behavioral disturbance: Secondary | ICD-10-CM | POA: Diagnosis not present

## 2022-12-24 DIAGNOSIS — I4891 Unspecified atrial fibrillation: Secondary | ICD-10-CM | POA: Diagnosis not present

## 2022-12-24 DIAGNOSIS — I509 Heart failure, unspecified: Secondary | ICD-10-CM | POA: Diagnosis not present

## 2022-12-25 DIAGNOSIS — I509 Heart failure, unspecified: Secondary | ICD-10-CM | POA: Diagnosis not present

## 2022-12-25 DIAGNOSIS — I2510 Atherosclerotic heart disease of native coronary artery without angina pectoris: Secondary | ICD-10-CM | POA: Diagnosis not present

## 2022-12-25 DIAGNOSIS — F0280 Dementia in other diseases classified elsewhere without behavioral disturbance: Secondary | ICD-10-CM | POA: Diagnosis not present

## 2022-12-25 DIAGNOSIS — G309 Alzheimer's disease, unspecified: Secondary | ICD-10-CM | POA: Diagnosis not present

## 2022-12-25 DIAGNOSIS — I4891 Unspecified atrial fibrillation: Secondary | ICD-10-CM | POA: Diagnosis not present

## 2022-12-26 DIAGNOSIS — G309 Alzheimer's disease, unspecified: Secondary | ICD-10-CM | POA: Diagnosis not present

## 2022-12-26 DIAGNOSIS — I509 Heart failure, unspecified: Secondary | ICD-10-CM | POA: Diagnosis not present

## 2022-12-26 DIAGNOSIS — I4891 Unspecified atrial fibrillation: Secondary | ICD-10-CM | POA: Diagnosis not present

## 2022-12-26 DIAGNOSIS — F0280 Dementia in other diseases classified elsewhere without behavioral disturbance: Secondary | ICD-10-CM | POA: Diagnosis not present

## 2022-12-26 DIAGNOSIS — I2510 Atherosclerotic heart disease of native coronary artery without angina pectoris: Secondary | ICD-10-CM | POA: Diagnosis not present

## 2022-12-29 DIAGNOSIS — F0280 Dementia in other diseases classified elsewhere without behavioral disturbance: Secondary | ICD-10-CM | POA: Diagnosis not present

## 2022-12-29 DIAGNOSIS — I4891 Unspecified atrial fibrillation: Secondary | ICD-10-CM | POA: Diagnosis not present

## 2022-12-29 DIAGNOSIS — I509 Heart failure, unspecified: Secondary | ICD-10-CM | POA: Diagnosis not present

## 2022-12-29 DIAGNOSIS — G309 Alzheimer's disease, unspecified: Secondary | ICD-10-CM | POA: Diagnosis not present

## 2022-12-29 DIAGNOSIS — I2510 Atherosclerotic heart disease of native coronary artery without angina pectoris: Secondary | ICD-10-CM | POA: Diagnosis not present

## 2022-12-31 DIAGNOSIS — I2510 Atherosclerotic heart disease of native coronary artery without angina pectoris: Secondary | ICD-10-CM | POA: Diagnosis not present

## 2022-12-31 DIAGNOSIS — I4891 Unspecified atrial fibrillation: Secondary | ICD-10-CM | POA: Diagnosis not present

## 2022-12-31 DIAGNOSIS — F0280 Dementia in other diseases classified elsewhere without behavioral disturbance: Secondary | ICD-10-CM | POA: Diagnosis not present

## 2022-12-31 DIAGNOSIS — G309 Alzheimer's disease, unspecified: Secondary | ICD-10-CM | POA: Diagnosis not present

## 2022-12-31 DIAGNOSIS — I509 Heart failure, unspecified: Secondary | ICD-10-CM | POA: Diagnosis not present

## 2023-01-01 DIAGNOSIS — I509 Heart failure, unspecified: Secondary | ICD-10-CM | POA: Diagnosis not present

## 2023-01-01 DIAGNOSIS — I4891 Unspecified atrial fibrillation: Secondary | ICD-10-CM | POA: Diagnosis not present

## 2023-01-01 DIAGNOSIS — F0280 Dementia in other diseases classified elsewhere without behavioral disturbance: Secondary | ICD-10-CM | POA: Diagnosis not present

## 2023-01-01 DIAGNOSIS — I2510 Atherosclerotic heart disease of native coronary artery without angina pectoris: Secondary | ICD-10-CM | POA: Diagnosis not present

## 2023-01-01 DIAGNOSIS — G309 Alzheimer's disease, unspecified: Secondary | ICD-10-CM | POA: Diagnosis not present

## 2023-01-02 DIAGNOSIS — F0280 Dementia in other diseases classified elsewhere without behavioral disturbance: Secondary | ICD-10-CM | POA: Diagnosis not present

## 2023-01-02 DIAGNOSIS — G309 Alzheimer's disease, unspecified: Secondary | ICD-10-CM | POA: Diagnosis not present

## 2023-01-02 DIAGNOSIS — I509 Heart failure, unspecified: Secondary | ICD-10-CM | POA: Diagnosis not present

## 2023-01-02 DIAGNOSIS — I4891 Unspecified atrial fibrillation: Secondary | ICD-10-CM | POA: Diagnosis not present

## 2023-01-02 DIAGNOSIS — I2510 Atherosclerotic heart disease of native coronary artery without angina pectoris: Secondary | ICD-10-CM | POA: Diagnosis not present

## 2023-01-06 DIAGNOSIS — I509 Heart failure, unspecified: Secondary | ICD-10-CM | POA: Diagnosis not present

## 2023-01-06 DIAGNOSIS — F0280 Dementia in other diseases classified elsewhere without behavioral disturbance: Secondary | ICD-10-CM | POA: Diagnosis not present

## 2023-01-06 DIAGNOSIS — I2510 Atherosclerotic heart disease of native coronary artery without angina pectoris: Secondary | ICD-10-CM | POA: Diagnosis not present

## 2023-01-06 DIAGNOSIS — G309 Alzheimer's disease, unspecified: Secondary | ICD-10-CM | POA: Diagnosis not present

## 2023-01-06 DIAGNOSIS — I4891 Unspecified atrial fibrillation: Secondary | ICD-10-CM | POA: Diagnosis not present

## 2023-01-07 DIAGNOSIS — I2510 Atherosclerotic heart disease of native coronary artery without angina pectoris: Secondary | ICD-10-CM | POA: Diagnosis not present

## 2023-01-07 DIAGNOSIS — I509 Heart failure, unspecified: Secondary | ICD-10-CM | POA: Diagnosis not present

## 2023-01-07 DIAGNOSIS — I4891 Unspecified atrial fibrillation: Secondary | ICD-10-CM | POA: Diagnosis not present

## 2023-01-07 DIAGNOSIS — F0280 Dementia in other diseases classified elsewhere without behavioral disturbance: Secondary | ICD-10-CM | POA: Diagnosis not present

## 2023-01-07 DIAGNOSIS — G309 Alzheimer's disease, unspecified: Secondary | ICD-10-CM | POA: Diagnosis not present

## 2023-01-09 DIAGNOSIS — G309 Alzheimer's disease, unspecified: Secondary | ICD-10-CM | POA: Diagnosis not present

## 2023-01-09 DIAGNOSIS — I2510 Atherosclerotic heart disease of native coronary artery without angina pectoris: Secondary | ICD-10-CM | POA: Diagnosis not present

## 2023-01-09 DIAGNOSIS — I4891 Unspecified atrial fibrillation: Secondary | ICD-10-CM | POA: Diagnosis not present

## 2023-01-09 DIAGNOSIS — F0280 Dementia in other diseases classified elsewhere without behavioral disturbance: Secondary | ICD-10-CM | POA: Diagnosis not present

## 2023-01-09 DIAGNOSIS — I509 Heart failure, unspecified: Secondary | ICD-10-CM | POA: Diagnosis not present

## 2023-01-12 ENCOUNTER — Telehealth: Payer: Self-pay

## 2023-01-12 DIAGNOSIS — I509 Heart failure, unspecified: Secondary | ICD-10-CM | POA: Diagnosis not present

## 2023-01-12 DIAGNOSIS — I2510 Atherosclerotic heart disease of native coronary artery without angina pectoris: Secondary | ICD-10-CM | POA: Diagnosis not present

## 2023-01-12 DIAGNOSIS — G309 Alzheimer's disease, unspecified: Secondary | ICD-10-CM | POA: Diagnosis not present

## 2023-01-12 DIAGNOSIS — F0280 Dementia in other diseases classified elsewhere without behavioral disturbance: Secondary | ICD-10-CM | POA: Diagnosis not present

## 2023-01-12 DIAGNOSIS — I4891 Unspecified atrial fibrillation: Secondary | ICD-10-CM | POA: Diagnosis not present

## 2023-01-12 NOTE — Addendum Note (Signed)
Addended by: George Ina E on: 01/12/2023 11:54 AM   Modules accepted: Orders, Level of Service

## 2023-01-12 NOTE — Telephone Encounter (Signed)
This encounter was created in error - please disregard.

## 2023-01-14 DIAGNOSIS — I4891 Unspecified atrial fibrillation: Secondary | ICD-10-CM | POA: Diagnosis not present

## 2023-01-14 DIAGNOSIS — G309 Alzheimer's disease, unspecified: Secondary | ICD-10-CM | POA: Diagnosis not present

## 2023-01-14 DIAGNOSIS — F0280 Dementia in other diseases classified elsewhere without behavioral disturbance: Secondary | ICD-10-CM | POA: Diagnosis not present

## 2023-01-14 DIAGNOSIS — I509 Heart failure, unspecified: Secondary | ICD-10-CM | POA: Diagnosis not present

## 2023-01-14 DIAGNOSIS — I2510 Atherosclerotic heart disease of native coronary artery without angina pectoris: Secondary | ICD-10-CM | POA: Diagnosis not present

## 2023-01-15 DIAGNOSIS — F0280 Dementia in other diseases classified elsewhere without behavioral disturbance: Secondary | ICD-10-CM | POA: Diagnosis not present

## 2023-01-15 DIAGNOSIS — I4891 Unspecified atrial fibrillation: Secondary | ICD-10-CM | POA: Diagnosis not present

## 2023-01-15 DIAGNOSIS — G309 Alzheimer's disease, unspecified: Secondary | ICD-10-CM | POA: Diagnosis not present

## 2023-01-15 DIAGNOSIS — I509 Heart failure, unspecified: Secondary | ICD-10-CM | POA: Diagnosis not present

## 2023-01-15 DIAGNOSIS — I2510 Atherosclerotic heart disease of native coronary artery without angina pectoris: Secondary | ICD-10-CM | POA: Diagnosis not present

## 2023-01-16 DIAGNOSIS — I4891 Unspecified atrial fibrillation: Secondary | ICD-10-CM | POA: Diagnosis not present

## 2023-01-16 DIAGNOSIS — I2510 Atherosclerotic heart disease of native coronary artery without angina pectoris: Secondary | ICD-10-CM | POA: Diagnosis not present

## 2023-01-16 DIAGNOSIS — I509 Heart failure, unspecified: Secondary | ICD-10-CM | POA: Diagnosis not present

## 2023-01-16 DIAGNOSIS — G309 Alzheimer's disease, unspecified: Secondary | ICD-10-CM | POA: Diagnosis not present

## 2023-01-16 DIAGNOSIS — F0280 Dementia in other diseases classified elsewhere without behavioral disturbance: Secondary | ICD-10-CM | POA: Diagnosis not present

## 2023-01-19 DIAGNOSIS — I509 Heart failure, unspecified: Secondary | ICD-10-CM | POA: Diagnosis not present

## 2023-01-19 DIAGNOSIS — G309 Alzheimer's disease, unspecified: Secondary | ICD-10-CM | POA: Diagnosis not present

## 2023-01-19 DIAGNOSIS — I4891 Unspecified atrial fibrillation: Secondary | ICD-10-CM | POA: Diagnosis not present

## 2023-01-19 DIAGNOSIS — F0280 Dementia in other diseases classified elsewhere without behavioral disturbance: Secondary | ICD-10-CM | POA: Diagnosis not present

## 2023-01-19 DIAGNOSIS — I2510 Atherosclerotic heart disease of native coronary artery without angina pectoris: Secondary | ICD-10-CM | POA: Diagnosis not present

## 2023-01-20 DIAGNOSIS — I2510 Atherosclerotic heart disease of native coronary artery without angina pectoris: Secondary | ICD-10-CM | POA: Diagnosis not present

## 2023-01-20 DIAGNOSIS — I4891 Unspecified atrial fibrillation: Secondary | ICD-10-CM | POA: Diagnosis not present

## 2023-01-20 DIAGNOSIS — G309 Alzheimer's disease, unspecified: Secondary | ICD-10-CM | POA: Diagnosis not present

## 2023-01-20 DIAGNOSIS — F0280 Dementia in other diseases classified elsewhere without behavioral disturbance: Secondary | ICD-10-CM | POA: Diagnosis not present

## 2023-01-20 DIAGNOSIS — I509 Heart failure, unspecified: Secondary | ICD-10-CM | POA: Diagnosis not present

## 2023-01-21 DIAGNOSIS — F0280 Dementia in other diseases classified elsewhere without behavioral disturbance: Secondary | ICD-10-CM | POA: Diagnosis not present

## 2023-01-21 DIAGNOSIS — I509 Heart failure, unspecified: Secondary | ICD-10-CM | POA: Diagnosis not present

## 2023-01-21 DIAGNOSIS — I2510 Atherosclerotic heart disease of native coronary artery without angina pectoris: Secondary | ICD-10-CM | POA: Diagnosis not present

## 2023-01-21 DIAGNOSIS — G309 Alzheimer's disease, unspecified: Secondary | ICD-10-CM | POA: Diagnosis not present

## 2023-01-21 DIAGNOSIS — I4891 Unspecified atrial fibrillation: Secondary | ICD-10-CM | POA: Diagnosis not present

## 2023-01-22 DIAGNOSIS — G309 Alzheimer's disease, unspecified: Secondary | ICD-10-CM | POA: Diagnosis not present

## 2023-01-22 DIAGNOSIS — F0280 Dementia in other diseases classified elsewhere without behavioral disturbance: Secondary | ICD-10-CM | POA: Diagnosis not present

## 2023-01-22 DIAGNOSIS — I4891 Unspecified atrial fibrillation: Secondary | ICD-10-CM | POA: Diagnosis not present

## 2023-01-22 DIAGNOSIS — I2510 Atherosclerotic heart disease of native coronary artery without angina pectoris: Secondary | ICD-10-CM | POA: Diagnosis not present

## 2023-01-22 DIAGNOSIS — I509 Heart failure, unspecified: Secondary | ICD-10-CM | POA: Diagnosis not present

## 2023-01-23 DIAGNOSIS — I509 Heart failure, unspecified: Secondary | ICD-10-CM | POA: Diagnosis not present

## 2023-01-23 DIAGNOSIS — I2510 Atherosclerotic heart disease of native coronary artery without angina pectoris: Secondary | ICD-10-CM | POA: Diagnosis not present

## 2023-01-23 DIAGNOSIS — I4891 Unspecified atrial fibrillation: Secondary | ICD-10-CM | POA: Diagnosis not present

## 2023-01-23 DIAGNOSIS — F0280 Dementia in other diseases classified elsewhere without behavioral disturbance: Secondary | ICD-10-CM | POA: Diagnosis not present

## 2023-01-23 DIAGNOSIS — G309 Alzheimer's disease, unspecified: Secondary | ICD-10-CM | POA: Diagnosis not present

## 2023-01-24 DIAGNOSIS — I4891 Unspecified atrial fibrillation: Secondary | ICD-10-CM | POA: Diagnosis not present

## 2023-01-24 DIAGNOSIS — I2510 Atherosclerotic heart disease of native coronary artery without angina pectoris: Secondary | ICD-10-CM | POA: Diagnosis not present

## 2023-01-24 DIAGNOSIS — I509 Heart failure, unspecified: Secondary | ICD-10-CM | POA: Diagnosis not present

## 2023-01-24 DIAGNOSIS — F0280 Dementia in other diseases classified elsewhere without behavioral disturbance: Secondary | ICD-10-CM | POA: Diagnosis not present

## 2023-01-24 DIAGNOSIS — G309 Alzheimer's disease, unspecified: Secondary | ICD-10-CM | POA: Diagnosis not present

## 2023-01-26 DIAGNOSIS — I2510 Atherosclerotic heart disease of native coronary artery without angina pectoris: Secondary | ICD-10-CM | POA: Diagnosis not present

## 2023-01-26 DIAGNOSIS — I4891 Unspecified atrial fibrillation: Secondary | ICD-10-CM | POA: Diagnosis not present

## 2023-01-26 DIAGNOSIS — F0280 Dementia in other diseases classified elsewhere without behavioral disturbance: Secondary | ICD-10-CM | POA: Diagnosis not present

## 2023-01-26 DIAGNOSIS — I509 Heart failure, unspecified: Secondary | ICD-10-CM | POA: Diagnosis not present

## 2023-01-26 DIAGNOSIS — G309 Alzheimer's disease, unspecified: Secondary | ICD-10-CM | POA: Diagnosis not present

## 2023-01-28 DIAGNOSIS — I4891 Unspecified atrial fibrillation: Secondary | ICD-10-CM | POA: Diagnosis not present

## 2023-01-28 DIAGNOSIS — I2510 Atherosclerotic heart disease of native coronary artery without angina pectoris: Secondary | ICD-10-CM | POA: Diagnosis not present

## 2023-01-28 DIAGNOSIS — I509 Heart failure, unspecified: Secondary | ICD-10-CM | POA: Diagnosis not present

## 2023-01-28 DIAGNOSIS — F0280 Dementia in other diseases classified elsewhere without behavioral disturbance: Secondary | ICD-10-CM | POA: Diagnosis not present

## 2023-01-28 DIAGNOSIS — G309 Alzheimer's disease, unspecified: Secondary | ICD-10-CM | POA: Diagnosis not present

## 2023-01-30 DIAGNOSIS — I2510 Atherosclerotic heart disease of native coronary artery without angina pectoris: Secondary | ICD-10-CM | POA: Diagnosis not present

## 2023-01-30 DIAGNOSIS — I4891 Unspecified atrial fibrillation: Secondary | ICD-10-CM | POA: Diagnosis not present

## 2023-01-30 DIAGNOSIS — F0280 Dementia in other diseases classified elsewhere without behavioral disturbance: Secondary | ICD-10-CM | POA: Diagnosis not present

## 2023-01-30 DIAGNOSIS — I509 Heart failure, unspecified: Secondary | ICD-10-CM | POA: Diagnosis not present

## 2023-01-30 DIAGNOSIS — G309 Alzheimer's disease, unspecified: Secondary | ICD-10-CM | POA: Diagnosis not present

## 2023-02-02 DIAGNOSIS — I4891 Unspecified atrial fibrillation: Secondary | ICD-10-CM | POA: Diagnosis not present

## 2023-02-02 DIAGNOSIS — I509 Heart failure, unspecified: Secondary | ICD-10-CM | POA: Diagnosis not present

## 2023-02-02 DIAGNOSIS — F0280 Dementia in other diseases classified elsewhere without behavioral disturbance: Secondary | ICD-10-CM | POA: Diagnosis not present

## 2023-02-02 DIAGNOSIS — G309 Alzheimer's disease, unspecified: Secondary | ICD-10-CM | POA: Diagnosis not present

## 2023-02-02 DIAGNOSIS — I2510 Atherosclerotic heart disease of native coronary artery without angina pectoris: Secondary | ICD-10-CM | POA: Diagnosis not present

## 2023-02-03 DIAGNOSIS — I509 Heart failure, unspecified: Secondary | ICD-10-CM | POA: Diagnosis not present

## 2023-02-03 DIAGNOSIS — I2510 Atherosclerotic heart disease of native coronary artery without angina pectoris: Secondary | ICD-10-CM | POA: Diagnosis not present

## 2023-02-03 DIAGNOSIS — F0280 Dementia in other diseases classified elsewhere without behavioral disturbance: Secondary | ICD-10-CM | POA: Diagnosis not present

## 2023-02-03 DIAGNOSIS — G309 Alzheimer's disease, unspecified: Secondary | ICD-10-CM | POA: Diagnosis not present

## 2023-02-03 DIAGNOSIS — I4891 Unspecified atrial fibrillation: Secondary | ICD-10-CM | POA: Diagnosis not present

## 2023-02-04 DIAGNOSIS — G309 Alzheimer's disease, unspecified: Secondary | ICD-10-CM | POA: Diagnosis not present

## 2023-02-04 DIAGNOSIS — I509 Heart failure, unspecified: Secondary | ICD-10-CM | POA: Diagnosis not present

## 2023-02-04 DIAGNOSIS — F0280 Dementia in other diseases classified elsewhere without behavioral disturbance: Secondary | ICD-10-CM | POA: Diagnosis not present

## 2023-02-04 DIAGNOSIS — I2510 Atherosclerotic heart disease of native coronary artery without angina pectoris: Secondary | ICD-10-CM | POA: Diagnosis not present

## 2023-02-04 DIAGNOSIS — I4891 Unspecified atrial fibrillation: Secondary | ICD-10-CM | POA: Diagnosis not present

## 2023-02-06 DIAGNOSIS — G309 Alzheimer's disease, unspecified: Secondary | ICD-10-CM | POA: Diagnosis not present

## 2023-02-06 DIAGNOSIS — I2510 Atherosclerotic heart disease of native coronary artery without angina pectoris: Secondary | ICD-10-CM | POA: Diagnosis not present

## 2023-02-06 DIAGNOSIS — I4891 Unspecified atrial fibrillation: Secondary | ICD-10-CM | POA: Diagnosis not present

## 2023-02-06 DIAGNOSIS — I509 Heart failure, unspecified: Secondary | ICD-10-CM | POA: Diagnosis not present

## 2023-02-06 DIAGNOSIS — F0280 Dementia in other diseases classified elsewhere without behavioral disturbance: Secondary | ICD-10-CM | POA: Diagnosis not present

## 2023-02-09 DIAGNOSIS — I509 Heart failure, unspecified: Secondary | ICD-10-CM | POA: Diagnosis not present

## 2023-02-09 DIAGNOSIS — G309 Alzheimer's disease, unspecified: Secondary | ICD-10-CM | POA: Diagnosis not present

## 2023-02-09 DIAGNOSIS — I4891 Unspecified atrial fibrillation: Secondary | ICD-10-CM | POA: Diagnosis not present

## 2023-02-09 DIAGNOSIS — F0280 Dementia in other diseases classified elsewhere without behavioral disturbance: Secondary | ICD-10-CM | POA: Diagnosis not present

## 2023-02-09 DIAGNOSIS — I2510 Atherosclerotic heart disease of native coronary artery without angina pectoris: Secondary | ICD-10-CM | POA: Diagnosis not present

## 2023-02-11 DIAGNOSIS — F0280 Dementia in other diseases classified elsewhere without behavioral disturbance: Secondary | ICD-10-CM | POA: Diagnosis not present

## 2023-02-11 DIAGNOSIS — I2510 Atherosclerotic heart disease of native coronary artery without angina pectoris: Secondary | ICD-10-CM | POA: Diagnosis not present

## 2023-02-11 DIAGNOSIS — G309 Alzheimer's disease, unspecified: Secondary | ICD-10-CM | POA: Diagnosis not present

## 2023-02-11 DIAGNOSIS — I4891 Unspecified atrial fibrillation: Secondary | ICD-10-CM | POA: Diagnosis not present

## 2023-02-11 DIAGNOSIS — I509 Heart failure, unspecified: Secondary | ICD-10-CM | POA: Diagnosis not present

## 2023-02-13 DIAGNOSIS — F0280 Dementia in other diseases classified elsewhere without behavioral disturbance: Secondary | ICD-10-CM | POA: Diagnosis not present

## 2023-02-13 DIAGNOSIS — G309 Alzheimer's disease, unspecified: Secondary | ICD-10-CM | POA: Diagnosis not present

## 2023-02-13 DIAGNOSIS — I4891 Unspecified atrial fibrillation: Secondary | ICD-10-CM | POA: Diagnosis not present

## 2023-02-13 DIAGNOSIS — I2510 Atherosclerotic heart disease of native coronary artery without angina pectoris: Secondary | ICD-10-CM | POA: Diagnosis not present

## 2023-02-13 DIAGNOSIS — I509 Heart failure, unspecified: Secondary | ICD-10-CM | POA: Diagnosis not present

## 2023-02-16 DIAGNOSIS — I2510 Atherosclerotic heart disease of native coronary artery without angina pectoris: Secondary | ICD-10-CM | POA: Diagnosis not present

## 2023-02-16 DIAGNOSIS — I509 Heart failure, unspecified: Secondary | ICD-10-CM | POA: Diagnosis not present

## 2023-02-16 DIAGNOSIS — F0280 Dementia in other diseases classified elsewhere without behavioral disturbance: Secondary | ICD-10-CM | POA: Diagnosis not present

## 2023-02-16 DIAGNOSIS — G309 Alzheimer's disease, unspecified: Secondary | ICD-10-CM | POA: Diagnosis not present

## 2023-02-16 DIAGNOSIS — I4891 Unspecified atrial fibrillation: Secondary | ICD-10-CM | POA: Diagnosis not present

## 2023-02-17 DIAGNOSIS — G309 Alzheimer's disease, unspecified: Secondary | ICD-10-CM | POA: Diagnosis not present

## 2023-02-17 DIAGNOSIS — I2510 Atherosclerotic heart disease of native coronary artery without angina pectoris: Secondary | ICD-10-CM | POA: Diagnosis not present

## 2023-02-17 DIAGNOSIS — F0280 Dementia in other diseases classified elsewhere without behavioral disturbance: Secondary | ICD-10-CM | POA: Diagnosis not present

## 2023-02-17 DIAGNOSIS — I4891 Unspecified atrial fibrillation: Secondary | ICD-10-CM | POA: Diagnosis not present

## 2023-02-17 DIAGNOSIS — I509 Heart failure, unspecified: Secondary | ICD-10-CM | POA: Diagnosis not present

## 2023-02-18 DIAGNOSIS — I4891 Unspecified atrial fibrillation: Secondary | ICD-10-CM | POA: Diagnosis not present

## 2023-02-18 DIAGNOSIS — G309 Alzheimer's disease, unspecified: Secondary | ICD-10-CM | POA: Diagnosis not present

## 2023-02-18 DIAGNOSIS — I509 Heart failure, unspecified: Secondary | ICD-10-CM | POA: Diagnosis not present

## 2023-02-18 DIAGNOSIS — I2510 Atherosclerotic heart disease of native coronary artery without angina pectoris: Secondary | ICD-10-CM | POA: Diagnosis not present

## 2023-02-18 DIAGNOSIS — F0280 Dementia in other diseases classified elsewhere without behavioral disturbance: Secondary | ICD-10-CM | POA: Diagnosis not present

## 2023-02-19 DIAGNOSIS — G309 Alzheimer's disease, unspecified: Secondary | ICD-10-CM | POA: Diagnosis not present

## 2023-02-19 DIAGNOSIS — I2510 Atherosclerotic heart disease of native coronary artery without angina pectoris: Secondary | ICD-10-CM | POA: Diagnosis not present

## 2023-02-19 DIAGNOSIS — I4891 Unspecified atrial fibrillation: Secondary | ICD-10-CM | POA: Diagnosis not present

## 2023-02-19 DIAGNOSIS — I509 Heart failure, unspecified: Secondary | ICD-10-CM | POA: Diagnosis not present

## 2023-02-19 DIAGNOSIS — F0280 Dementia in other diseases classified elsewhere without behavioral disturbance: Secondary | ICD-10-CM | POA: Diagnosis not present

## 2023-02-20 DIAGNOSIS — I509 Heart failure, unspecified: Secondary | ICD-10-CM | POA: Diagnosis not present

## 2023-02-20 DIAGNOSIS — I2510 Atherosclerotic heart disease of native coronary artery without angina pectoris: Secondary | ICD-10-CM | POA: Diagnosis not present

## 2023-02-20 DIAGNOSIS — F0280 Dementia in other diseases classified elsewhere without behavioral disturbance: Secondary | ICD-10-CM | POA: Diagnosis not present

## 2023-02-20 DIAGNOSIS — G309 Alzheimer's disease, unspecified: Secondary | ICD-10-CM | POA: Diagnosis not present

## 2023-02-20 DIAGNOSIS — I4891 Unspecified atrial fibrillation: Secondary | ICD-10-CM | POA: Diagnosis not present

## 2023-02-23 DIAGNOSIS — I4891 Unspecified atrial fibrillation: Secondary | ICD-10-CM | POA: Diagnosis not present

## 2023-02-23 DIAGNOSIS — I2510 Atherosclerotic heart disease of native coronary artery without angina pectoris: Secondary | ICD-10-CM | POA: Diagnosis not present

## 2023-02-23 DIAGNOSIS — F0280 Dementia in other diseases classified elsewhere without behavioral disturbance: Secondary | ICD-10-CM | POA: Diagnosis not present

## 2023-02-23 DIAGNOSIS — G309 Alzheimer's disease, unspecified: Secondary | ICD-10-CM | POA: Diagnosis not present

## 2023-02-23 DIAGNOSIS — I509 Heart failure, unspecified: Secondary | ICD-10-CM | POA: Diagnosis not present

## 2023-02-25 DIAGNOSIS — F0280 Dementia in other diseases classified elsewhere without behavioral disturbance: Secondary | ICD-10-CM | POA: Diagnosis not present

## 2023-02-25 DIAGNOSIS — I2510 Atherosclerotic heart disease of native coronary artery without angina pectoris: Secondary | ICD-10-CM | POA: Diagnosis not present

## 2023-02-25 DIAGNOSIS — G309 Alzheimer's disease, unspecified: Secondary | ICD-10-CM | POA: Diagnosis not present

## 2023-02-25 DIAGNOSIS — I509 Heart failure, unspecified: Secondary | ICD-10-CM | POA: Diagnosis not present

## 2023-02-25 DIAGNOSIS — I4891 Unspecified atrial fibrillation: Secondary | ICD-10-CM | POA: Diagnosis not present

## 2023-02-27 DIAGNOSIS — I509 Heart failure, unspecified: Secondary | ICD-10-CM | POA: Diagnosis not present

## 2023-02-27 DIAGNOSIS — F0280 Dementia in other diseases classified elsewhere without behavioral disturbance: Secondary | ICD-10-CM | POA: Diagnosis not present

## 2023-02-27 DIAGNOSIS — G309 Alzheimer's disease, unspecified: Secondary | ICD-10-CM | POA: Diagnosis not present

## 2023-02-27 DIAGNOSIS — I4891 Unspecified atrial fibrillation: Secondary | ICD-10-CM | POA: Diagnosis not present

## 2023-02-27 DIAGNOSIS — I2510 Atherosclerotic heart disease of native coronary artery without angina pectoris: Secondary | ICD-10-CM | POA: Diagnosis not present

## 2023-03-02 DIAGNOSIS — I2510 Atherosclerotic heart disease of native coronary artery without angina pectoris: Secondary | ICD-10-CM | POA: Diagnosis not present

## 2023-03-02 DIAGNOSIS — I509 Heart failure, unspecified: Secondary | ICD-10-CM | POA: Diagnosis not present

## 2023-03-02 DIAGNOSIS — G309 Alzheimer's disease, unspecified: Secondary | ICD-10-CM | POA: Diagnosis not present

## 2023-03-02 DIAGNOSIS — I4891 Unspecified atrial fibrillation: Secondary | ICD-10-CM | POA: Diagnosis not present

## 2023-03-02 DIAGNOSIS — F0280 Dementia in other diseases classified elsewhere without behavioral disturbance: Secondary | ICD-10-CM | POA: Diagnosis not present

## 2023-03-09 DIAGNOSIS — R296 Repeated falls: Secondary | ICD-10-CM | POA: Diagnosis not present

## 2023-03-09 DIAGNOSIS — I10 Essential (primary) hypertension: Secondary | ICD-10-CM | POA: Diagnosis not present

## 2023-03-09 DIAGNOSIS — F0280 Dementia in other diseases classified elsewhere without behavioral disturbance: Secondary | ICD-10-CM | POA: Diagnosis not present

## 2023-03-09 DIAGNOSIS — E860 Dehydration: Secondary | ICD-10-CM | POA: Diagnosis not present

## 2023-03-09 DIAGNOSIS — N3946 Mixed incontinence: Secondary | ICD-10-CM | POA: Diagnosis not present

## 2023-04-27 IMAGING — CR DG CHEST 2V
2 series · 2 of 2 positions shown · non-contrast
Comparison: Chest CT 09/27/2019

CLINICAL DATA: Hemoptysis.

EXAM:
CHEST - 2 VIEW

[chest pa]
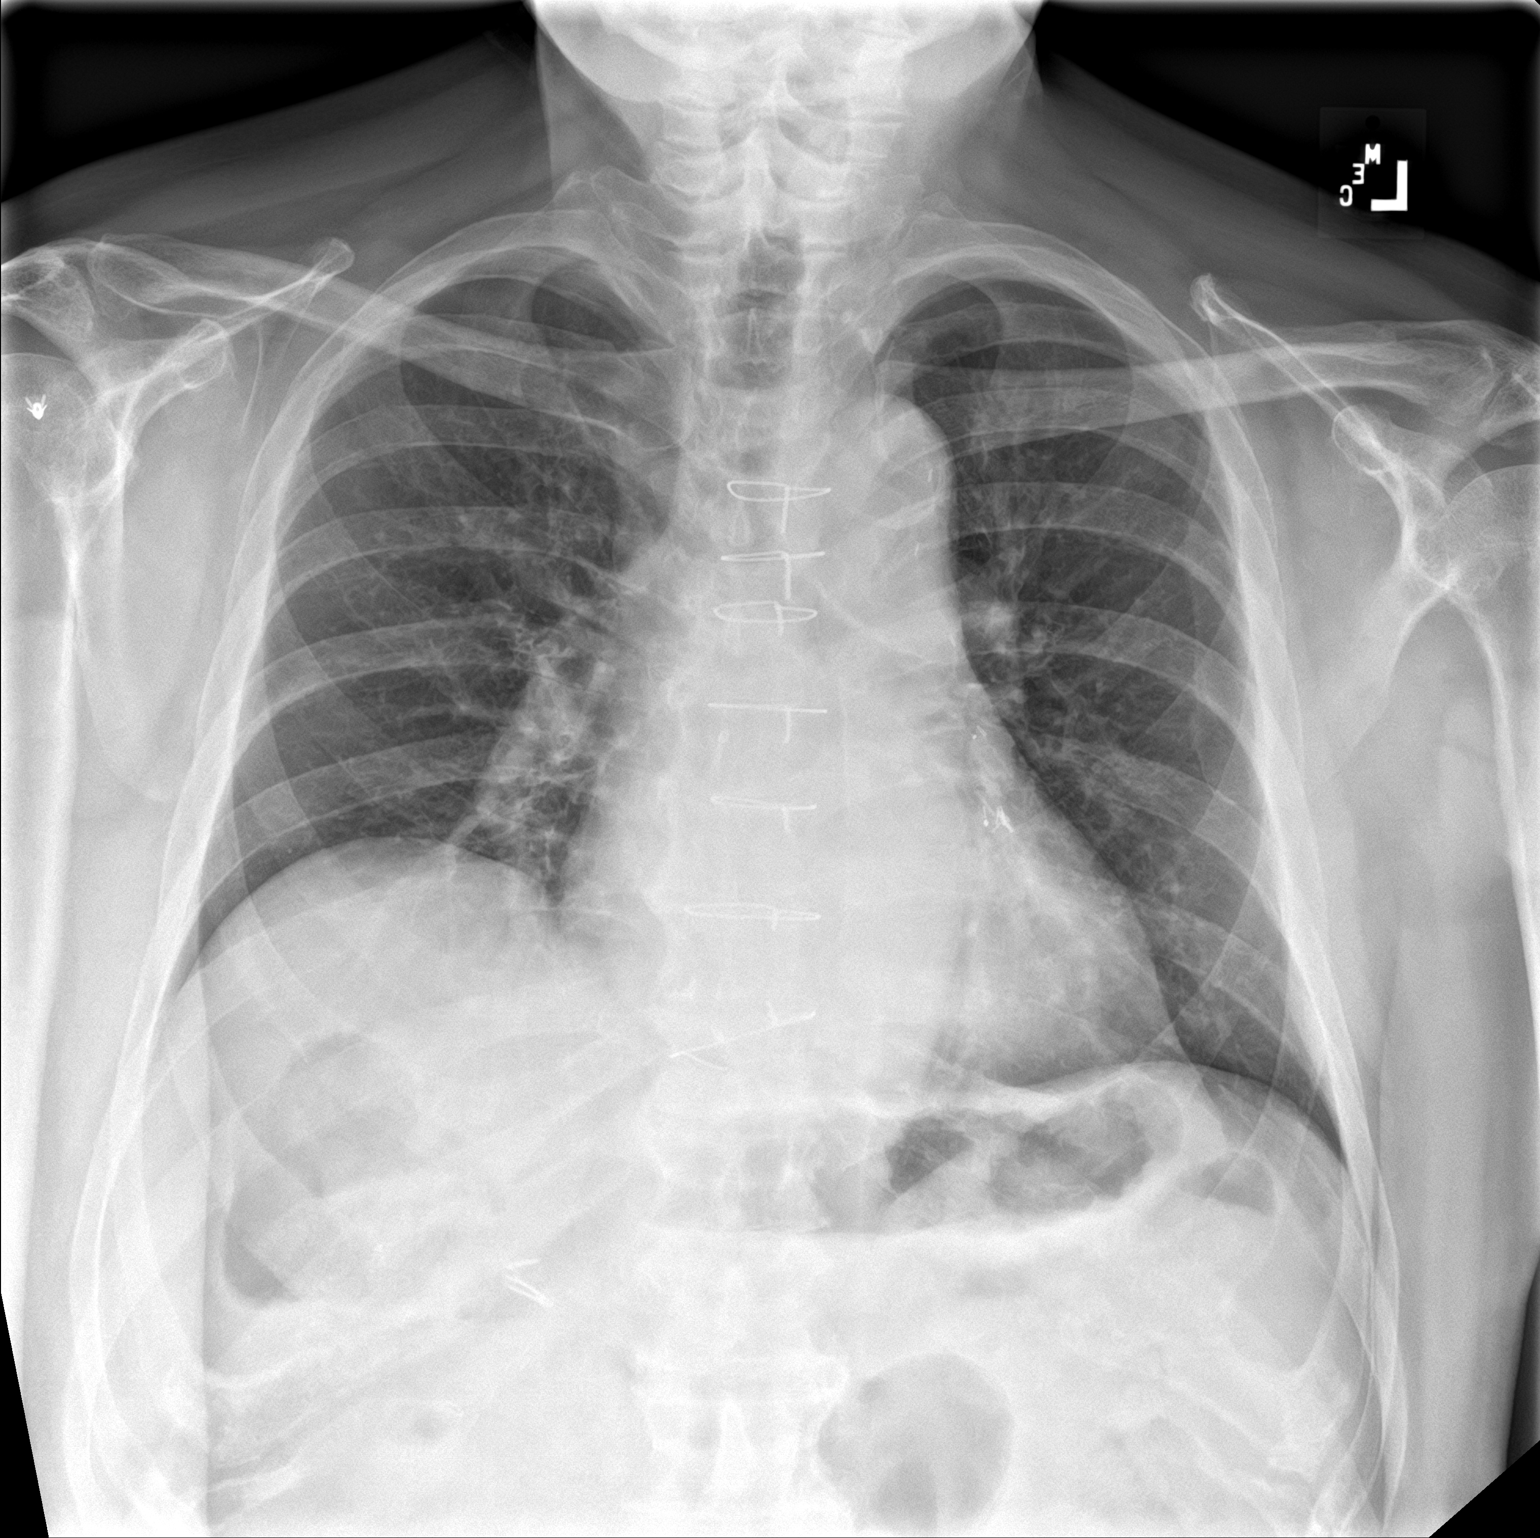

[chest lat]
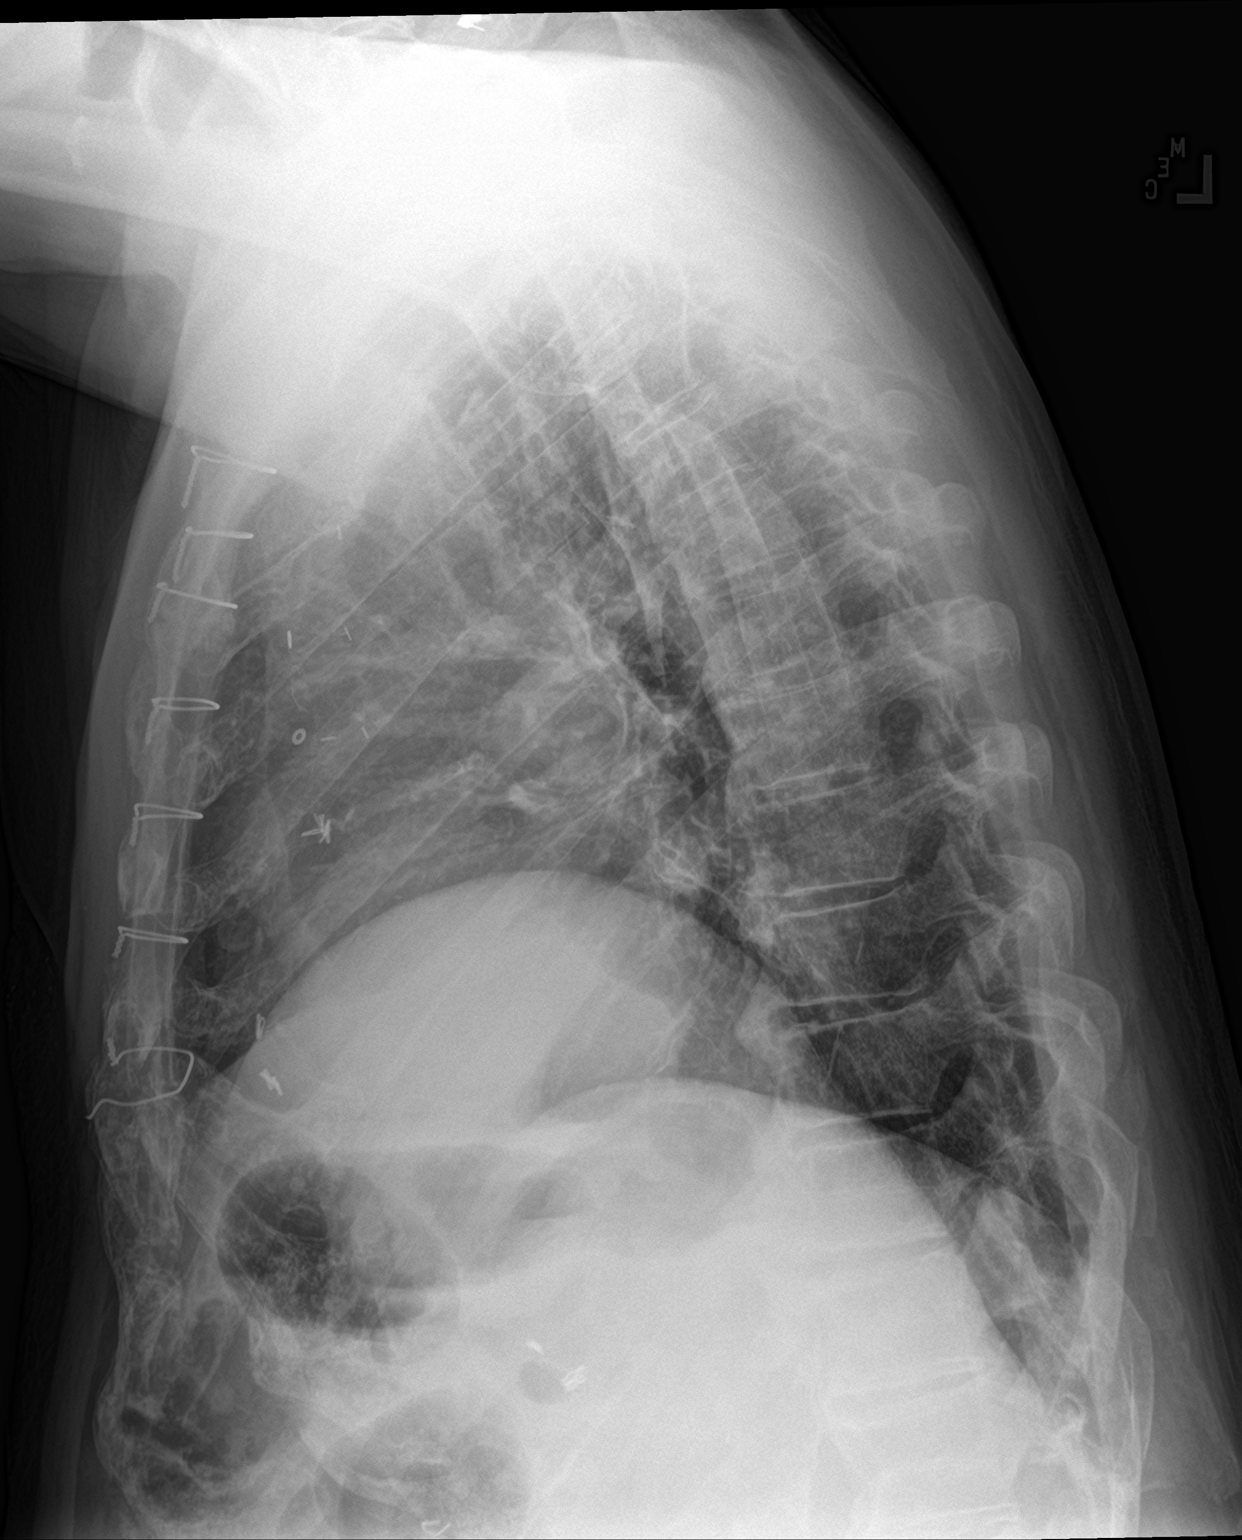

[2 of 2 positions shown; findings below may reference images not displayed]

FINDINGS: The cardiac silhouette, mediastinal and hilar contours are within
normal limits. Stable surgical changes from coronary artery bypass
surgery. Stable mild tortuosity and calcification of the thoracic
aorta.

The lungs are clear of an acute process. No pleural effusions or
pulmonary lesions. Moderate eventration of the right hemidiaphragm.

The bony thorax is intact. Remote postoperative changes involving
the right shoulder.
IMPRESSION: No acute cardiopulmonary findings.

## 2023-04-27 IMAGING — CT CT ANGIO CHEST
2 of 7 series · 18 of 46 positions shown · IV contrast (agent unspecified)
Comparison: None.

CLINICAL DATA: hemotpysis

EXAM:
CT ANGIOGRAPHY CHEST WITH CONTRAST
TECHNIQUE: Multidetector CT imaging of the chest was performed using the
standard protocol during bolus administration of intravenous
contrast. Multiplanar CT image reconstructions and MIPs were
obtained to evaluate the vascular anatomy.

[Series 5: thins · axial · 0.77mm/px · z∈[-777,-503]mm · 15 of 382 slices shown]
[im 20/382  lung]
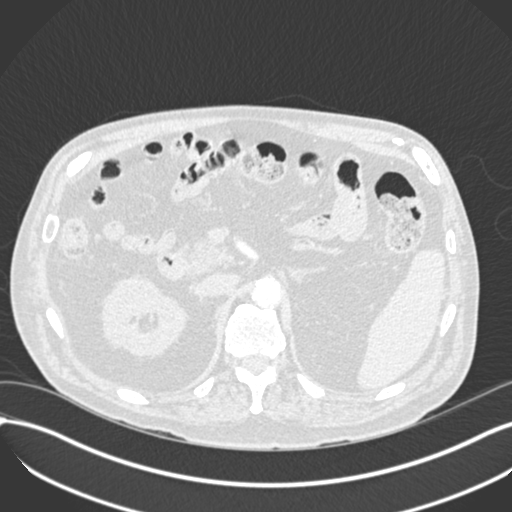
[im 39/382  soft-tissue]
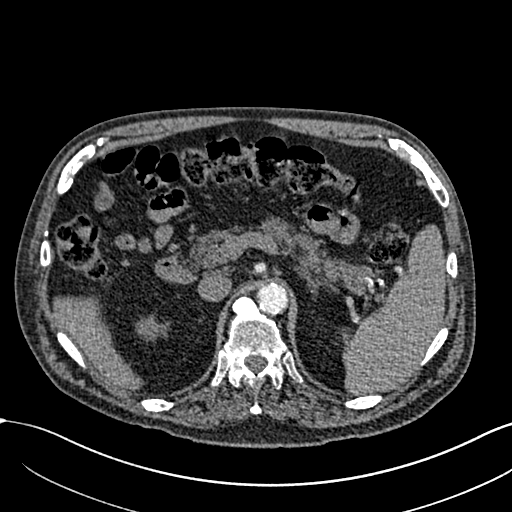
[im 77/382  lung]
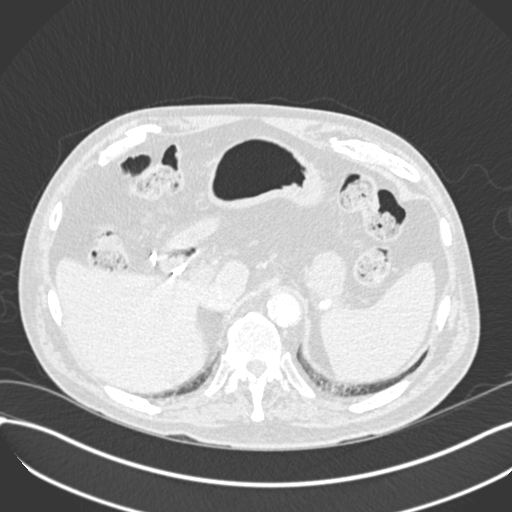
[im 96/382  soft-tissue]
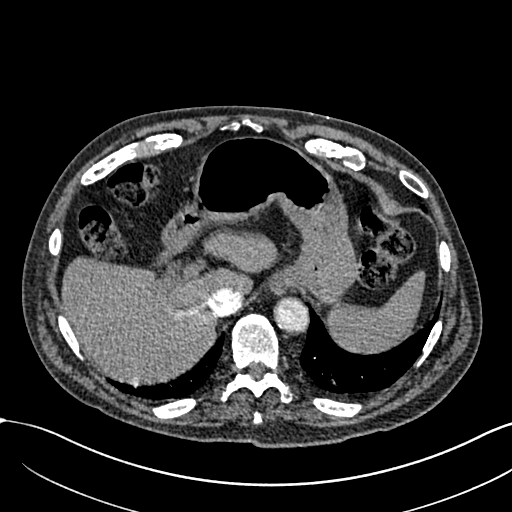
[im 115/382  lung]
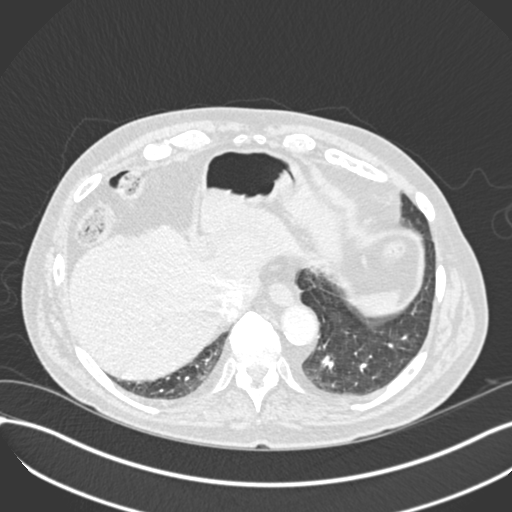
[im 134/382  soft-tissue]
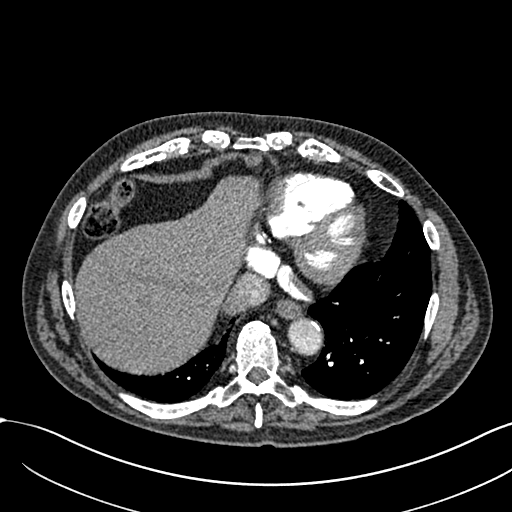
[im 172/382  lung]
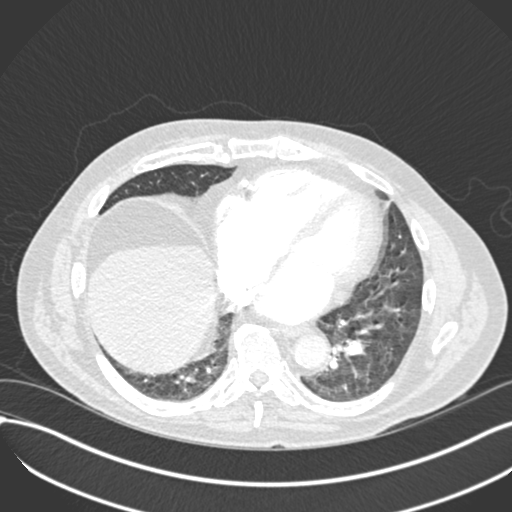
[im 191/382  soft-tissue]
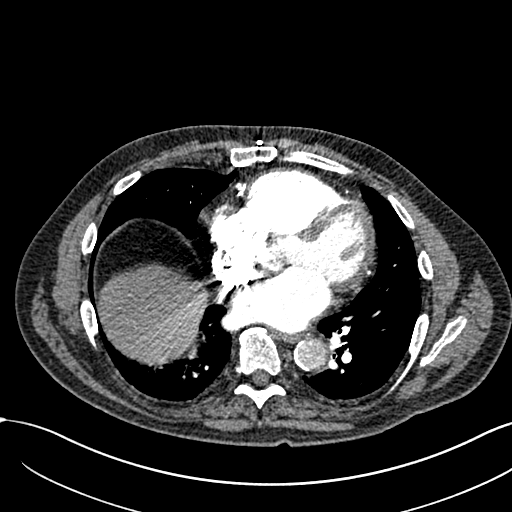
[im 210/382  lung]
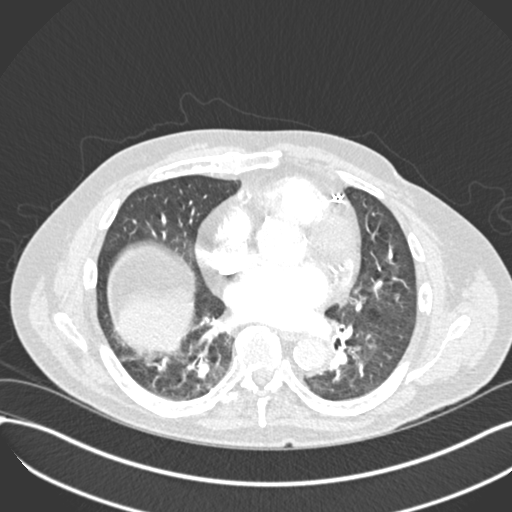
[im 248/382  soft-tissue]
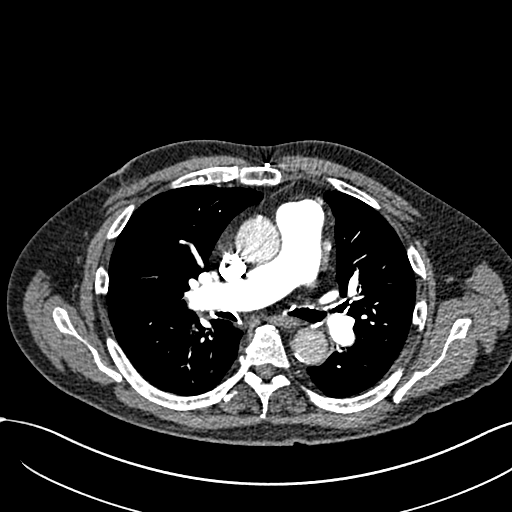
[im 267/382  lung]
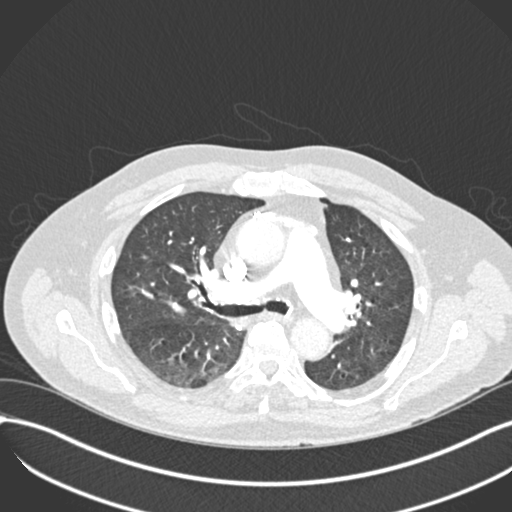
[im 286/382  soft-tissue]
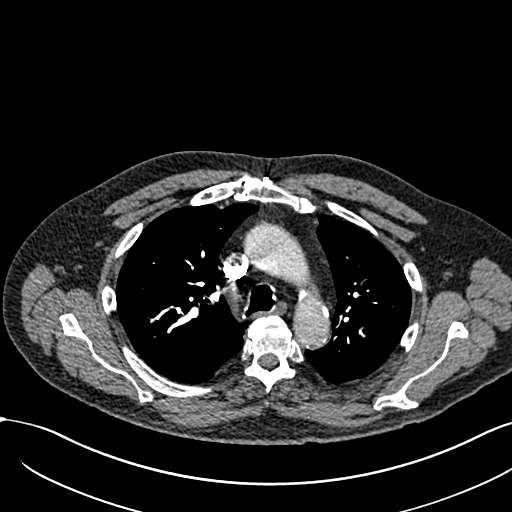
[im 305/382  lung]
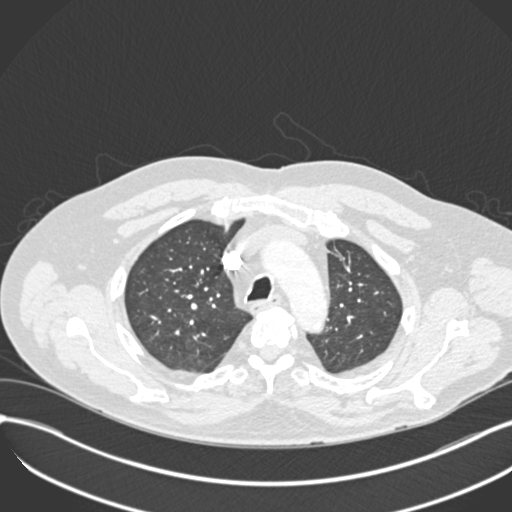
[im 343/382  soft-tissue]
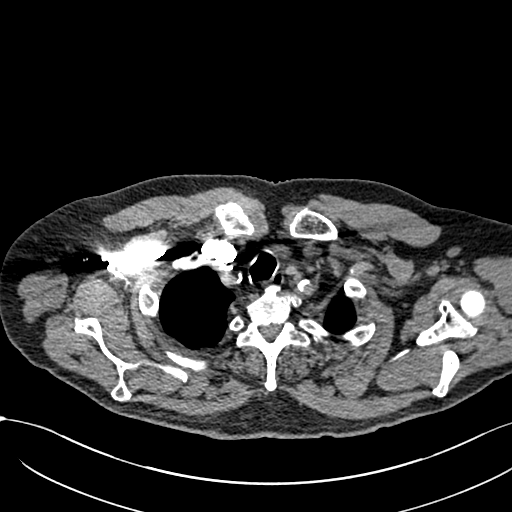
[im 362/382  lung]
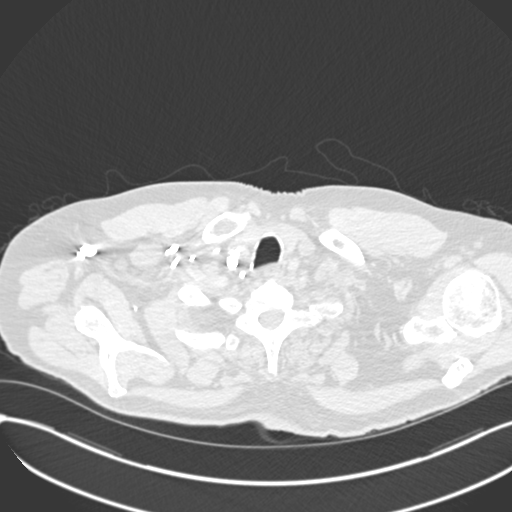

[Series 7: coronal mpr · coronal · 0.63mm/px · 3 of 93 slices shown]
[im 24/93  soft-tissue]
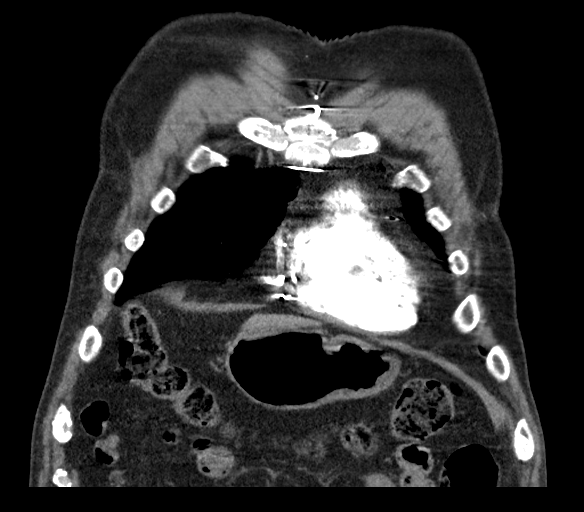
[im 47/93  soft-tissue]
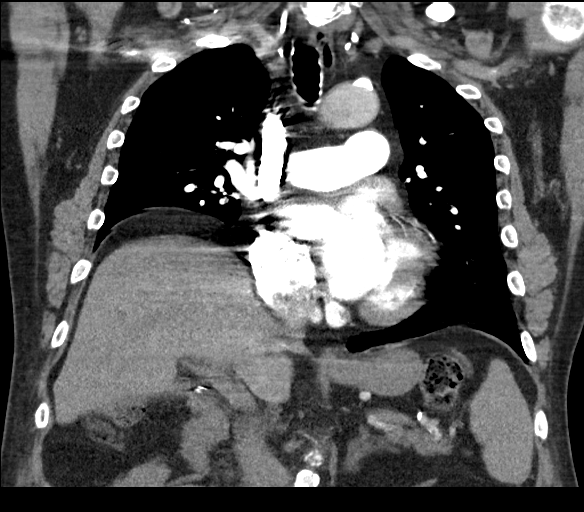
[im 70/93  soft-tissue]
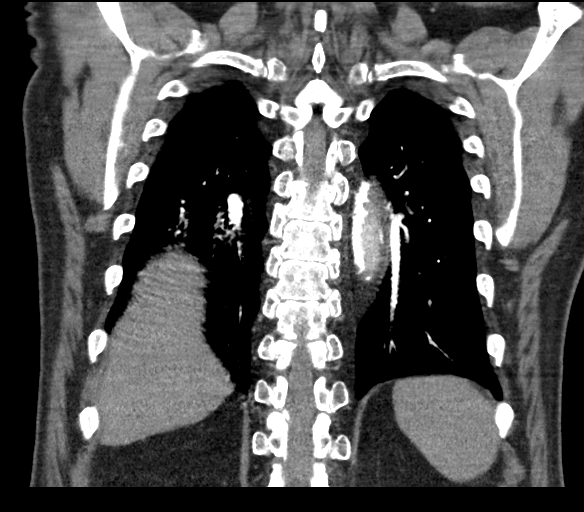

[18 of 46 positions shown; findings below may reference images not displayed]

RADIATION DOSE REDUCTION: This exam was performed according to the
departmental dose-optimization program which includes automated
exposure control, adjustment of the mA and/or kV according to
patient size and/or use of iterative reconstruction technique.

CONTRAST:  75mL OMNIPAQUE IOHEXOL 350 MG/ML SOLN
FINDINGS: Cardiovascular: Satisfactory opacification of the pulmonary arteries
to the segmental level. No evidence of pulmonary embolism. Limited
evaluation of the subsegmental level due to motion artifact. Normal
heart size. No significant pericardial effusion. The thoracic aorta
is normal in caliber. At least moderate atherosclerotic plaque of
the thoracic aorta. Four-vessel coronary artery calcifications
status post coronary artery bypass graft.

Mediastinum/Nodes: No enlarged mediastinal, hilar, or axillary lymph
nodes. Thyroid gland, trachea, and esophagus demonstrate no
significant findings.

Lungs/Pleura: Slightly limited evaluation due to respiratory motion
artifact. Bilateral lower lobe subsegmental atelectasis. No focal
consolidation. No pulmonary nodule. No pulmonary mass. No pleural
effusion. No pneumothorax.

Upper Abdomen: Status post cholecystectomy. There is a 1.5 cm left
adrenal gland nodule with a density of -14 Hounsfield units likely
representing a lipid rich benign adenoma.

Musculoskeletal:

No chest wall abnormality.

No suspicious lytic or blastic osseous lesions. No acute displaced
fracture. Multilevel degenerative changes of the spine.

Review of the MIP images confirms the above findings.
IMPRESSION: 1. No pulmonary embolus. Limited evaluation of the subsegmental
level due to motion artifact.
2. No acute intrathoracic abnormality.
3.  Aortic Atherosclerosis ([id number]).

## 2024-05-31 NOTE — Progress Notes (Signed)
    Assessment & Plan:  1. OSA on CPAP (Primary)   Patient Instructions  You can return here as needed. Call if you develop any new or worsen symptoms.   Please note: late entry documentation due to logistical difficulties during COVID-19 pandemic. This note is filed for information purposes only, and is not intended to be used for billing, nor does it represent the full scope/nature of the visit in question. Please see any associated scanned media linked to date of encounter for additional pertinent information.  Subjective:    HPI: Jeffery Reynolds is a 89 y.o. male presenting to the pulmonology clinic on 10/12/2019 with report of: Follow-up (Pt states he has been feeling sore but better. Pt states he has a tickle in his throat that causes him to cough. Pt denies any SOB, wheezing, fever or chills)     Outpatient Encounter Medications as of 10/12/2019  Medication Sig   [DISCONTINUED] allopurinol  (ZYLOPRIM ) 300 MG tablet TAKE 1 TABLET ON MONDAY, WEDNESDAY AND FRIDAY   [DISCONTINUED] Artificial Tear Ointment (DRY EYES OP) Apply 1 drop to eye 3 (three) times daily. (Patient not taking: Reported on 04/07/2022)   [DISCONTINUED] atorvastatin  (LIPITOR) 40 MG tablet Take 1 tablet (40 mg total) by mouth daily.   [DISCONTINUED] furosemide  (LASIX ) 20 MG tablet TAKE 1 TABLET BY MOUTH EVERY OTHER DAY FOR FLUID RETENTION   [DISCONTINUED] metoprolol  tartrate (LOPRESSOR ) 50 MG tablet TAKE 1 TABLET(50 MG) BY MOUTH TWICE DAILY   [DISCONTINUED] warfarin (COUMADIN ) 5 MG tablet TAKE 1 TABLET BY MOUTH DAILY OR AS DIRECTED BY COUMADIN  CLINIC (Patient taking differently: Take 5 mg by mouth. TAKE 1 TABLET BY MOUTH all days except for Monday and Friday. Monday and Friday pt takes 2.5 mg.)   No facility-administered encounter medications on file as of 10/12/2019.      Objective:   Vitals:   10/12/19 1049  BP: 118/64  Pulse: 85  Temp: 98 F (36.7 C)  Height: 5' 8 (1.727 m)  Weight: 203 lb 12.8 oz  (92.4 kg)  SpO2: 99% Comment: ON RA  TempSrc: Temporal  BMI (Calculated): 30.99     Physical exam documentation is limited by delayed entry of information.
